# Patient Record
Sex: Male | Born: 1960
Health system: Southern US, Community
[De-identification: ages and names within clinical notes are randomized; demographics above are authoritative.]

---

## 2003-12-03 ENCOUNTER — Emergency Department (HOSPITAL_COMMUNITY): Admission: AD | Admit: 2003-12-03 | Discharge: 2003-12-03 | Payer: Self-pay | Admitting: Internal Medicine

## 2005-09-02 ENCOUNTER — Emergency Department (HOSPITAL_COMMUNITY): Admission: EM | Admit: 2005-09-02 | Discharge: 2005-09-02 | Payer: Self-pay | Admitting: Emergency Medicine

## 2006-02-06 ENCOUNTER — Emergency Department (HOSPITAL_COMMUNITY): Admission: EM | Admit: 2006-02-06 | Discharge: 2006-02-06 | Payer: Self-pay | Admitting: Family Medicine

## 2006-02-09 ENCOUNTER — Emergency Department (HOSPITAL_COMMUNITY): Admission: EM | Admit: 2006-02-09 | Discharge: 2006-02-09 | Payer: Self-pay | Admitting: Family Medicine

## 2006-05-27 ENCOUNTER — Ambulatory Visit: Payer: Self-pay | Admitting: Family Medicine

## 2006-06-03 ENCOUNTER — Ambulatory Visit: Payer: Self-pay | Admitting: Family Medicine

## 2006-07-08 ENCOUNTER — Ambulatory Visit: Payer: Self-pay | Admitting: Family Medicine

## 2007-03-08 ENCOUNTER — Ambulatory Visit: Payer: Self-pay | Admitting: Family Medicine

## 2007-03-09 LAB — CONVERTED CEMR LAB
AST: 39 units/L — ABNORMAL HIGH (ref 0–37)
Alkaline Phosphatase: 93 units/L (ref 39–117)
Total Bilirubin: 0.6 mg/dL (ref 0.3–1.2)
Total Protein: 6.3 g/dL (ref 6.0–8.3)

## 2007-06-15 DIAGNOSIS — R945 Abnormal results of liver function studies: Secondary | ICD-10-CM | POA: Insufficient documentation

## 2007-06-15 DIAGNOSIS — L408 Other psoriasis: Secondary | ICD-10-CM | POA: Insufficient documentation

## 2007-06-15 DIAGNOSIS — M6580 Other synovitis and tenosynovitis, unspecified site: Secondary | ICD-10-CM | POA: Insufficient documentation

## 2007-06-15 DIAGNOSIS — M9420 Chondromalacia, unspecified site: Secondary | ICD-10-CM | POA: Insufficient documentation

## 2007-12-11 ENCOUNTER — Emergency Department (HOSPITAL_COMMUNITY): Admission: EM | Admit: 2007-12-11 | Discharge: 2007-12-11 | Payer: Self-pay | Admitting: *Deleted

## 2008-03-17 ENCOUNTER — Ambulatory Visit: Payer: Self-pay | Admitting: Family Medicine

## 2008-03-20 LAB — CONVERTED CEMR LAB
ALT: 34 units/L (ref 0–53)
Albumin: 3.7 g/dL (ref 3.5–5.2)
BUN: 9 mg/dL (ref 6–23)
Basophils Relative: 1.4 % — ABNORMAL HIGH (ref 0.0–1.0)
Bilirubin, Direct: 0.1 mg/dL (ref 0.0–0.3)
CO2: 30 meq/L (ref 19–32)
Calcium: 9.1 mg/dL (ref 8.4–10.5)
Eosinophils Relative: 5.4 % — ABNORMAL HIGH (ref 0.0–5.0)
GFR calc Af Amer: 84 mL/min
Glucose, Bld: 100 mg/dL — ABNORMAL HIGH (ref 70–99)
HCT: 42.1 % (ref 39.0–52.0)
Hemoglobin: 14.4 g/dL (ref 13.0–17.0)
LDL Cholesterol: 58 mg/dL (ref 0–99)
Lymphocytes Relative: 25.1 % (ref 12.0–46.0)
Monocytes Absolute: 0.5 10*3/uL (ref 0.1–1.0)
Monocytes Relative: 8.4 % (ref 3.0–12.0)
Neutro Abs: 3.4 10*3/uL (ref 1.4–7.7)
RBC: 5.02 M/uL (ref 4.22–5.81)
RDW: 12.2 % (ref 11.5–14.6)
Total CHOL/HDL Ratio: 3.3
Total Protein: 6.7 g/dL (ref 6.0–8.3)
Triglycerides: 158 mg/dL — ABNORMAL HIGH (ref 0–149)
WBC: 5.7 10*3/uL (ref 4.5–10.5)

## 2008-04-05 ENCOUNTER — Ambulatory Visit: Payer: Self-pay | Admitting: Family Medicine

## 2008-04-05 DIAGNOSIS — I10 Essential (primary) hypertension: Secondary | ICD-10-CM | POA: Insufficient documentation

## 2008-05-24 ENCOUNTER — Ambulatory Visit: Payer: Self-pay | Admitting: Family Medicine

## 2008-08-16 ENCOUNTER — Ambulatory Visit: Payer: Self-pay | Admitting: Family Medicine

## 2008-08-17 ENCOUNTER — Encounter: Payer: Self-pay | Admitting: Family Medicine

## 2009-03-02 ENCOUNTER — Ambulatory Visit: Payer: Self-pay | Admitting: Family Medicine

## 2009-03-02 DIAGNOSIS — F528 Other sexual dysfunction not due to a substance or known physiological condition: Secondary | ICD-10-CM | POA: Insufficient documentation

## 2009-03-02 DIAGNOSIS — R5383 Other fatigue: Secondary | ICD-10-CM

## 2009-03-02 DIAGNOSIS — J309 Allergic rhinitis, unspecified: Secondary | ICD-10-CM | POA: Insufficient documentation

## 2009-03-02 DIAGNOSIS — R5381 Other malaise: Secondary | ICD-10-CM | POA: Insufficient documentation

## 2009-03-05 LAB — CONVERTED CEMR LAB
AST: 43 units/L — ABNORMAL HIGH (ref 0–37)
Albumin: 3.8 g/dL (ref 3.5–5.2)
Alkaline Phosphatase: 87 units/L (ref 39–117)
BUN: 16 mg/dL (ref 6–23)
Basophils Relative: 0.5 % (ref 0.0–3.0)
Calcium: 8.9 mg/dL (ref 8.4–10.5)
Cholesterol: 109 mg/dL (ref 0–200)
Creatinine, Ser: 1.4 mg/dL (ref 0.4–1.5)
GFR calc non Af Amer: 57.59 mL/min (ref >60)
HCT: 48.7 % (ref 39.0–52.0)
Hemoglobin: 16.7 g/dL (ref 13.0–17.0)
LDL Cholesterol: 55 mg/dL (ref 0–99)
Lymphocytes Relative: 24.9 % (ref 12.0–46.0)
Lymphs Abs: 1.4 10*3/uL (ref 0.7–4.0)
MCHC: 34.2 g/dL (ref 30.0–36.0)
Monocytes Relative: 7.1 % (ref 3.0–12.0)
Neutro Abs: 3.8 10*3/uL (ref 1.4–7.7)
RBC: 5.89 M/uL — ABNORMAL HIGH (ref 4.22–5.81)
RDW: 13.2 % (ref 11.5–14.6)
TSH: 1.64 microintl units/mL (ref 0.35–5.50)
Total CHOL/HDL Ratio: 3
Total Protein: 6.7 g/dL (ref 6.0–8.3)
Triglycerides: 109 mg/dL (ref 0.0–149.0)

## 2009-03-06 LAB — CONVERTED CEMR LAB
Sex Hormone Binding: 33 nmol/L (ref 13–71)
Testosterone: 220.07 ng/dL — ABNORMAL LOW (ref 350–890)

## 2009-04-13 ENCOUNTER — Ambulatory Visit: Payer: Self-pay | Admitting: Family Medicine

## 2009-04-13 DIAGNOSIS — E786 Lipoprotein deficiency: Secondary | ICD-10-CM | POA: Insufficient documentation

## 2009-04-13 DIAGNOSIS — E291 Testicular hypofunction: Secondary | ICD-10-CM | POA: Insufficient documentation

## 2009-04-13 DIAGNOSIS — M25579 Pain in unspecified ankle and joints of unspecified foot: Secondary | ICD-10-CM | POA: Insufficient documentation

## 2009-05-02 ENCOUNTER — Telehealth (INDEPENDENT_AMBULATORY_CARE_PROVIDER_SITE_OTHER): Payer: Self-pay | Admitting: *Deleted

## 2011-08-07 LAB — POCT URINALYSIS DIP (DEVICE)
Glucose, UA: NEGATIVE
Nitrite: NEGATIVE
Operator id: 270961
pH: 6

## 2011-08-07 LAB — URINE CULTURE: Culture: NO GROWTH

## 2014-08-08 ENCOUNTER — Other Ambulatory Visit: Payer: Self-pay | Admitting: Rheumatology

## 2014-08-08 ENCOUNTER — Ambulatory Visit
Admission: RE | Admit: 2014-08-08 | Discharge: 2014-08-08 | Disposition: A | Discharge status: Home / Self Care | Payer: BC Managed Care – PPO | Source: Ambulatory Visit | Attending: Rheumatology | Admitting: Rheumatology

## 2014-08-08 DIAGNOSIS — Z9225 Personal history of immunosupression therapy: Secondary | ICD-10-CM

## 2014-09-27 ENCOUNTER — Ambulatory Visit: Payer: BC Managed Care – PPO | Admitting: Family Medicine

## 2015-02-06 ENCOUNTER — Ambulatory Visit: Payer: Self-pay | Admitting: Surgery

## 2015-02-06 NOTE — H&P (Signed)
History of Present Illness Kenneth [name]. Lee Kuang MD; 02/06/2015 10:15 PM) Patient words: pilonidal cyst.  The patient is a 54 year old male who presents with a pilonidal cyst. Patient referred by Corine Shelter, PA-C for evaluation of pilonidal disease. This is a 54 yo male who presents with an 8 month history of a palpable lump near his coccyx. This has become larger and more tender. He has noticed some bleeding from this area. He had a small mass in this area for years, but it became noticeably larger in the last few months. His wife apparently pulled a long hair from this area. He was evaluated and felt to have a pilonidal cyst. He is now referred for surgical evaluation. Other Problems (Ammie Eversole, LPN; 0/97/3532 9:92 PM) Arthritis High blood pressure  Past Surgical History (Ammie Eversole, LPN; 03/12/8340 9:62 PM) Cataract Surgery Right. Oral Surgery Resection of Small Bowel  Diagnostic Studies History (Ammie Eversole, LPN; 2/29/7989 2:11 PM) Colonoscopy never  Allergies (Ammie Eversole, LPN; 9/41/7408 1:44 PM) No Known Drug Allergies 02/06/2015  Medication History (Ammie Eversole, LPN; 07/04/5630 4:97 PM) Lisinopril-Hydrochlorothiazide (20-25MG  Tablet, Oral) Active.  Social History (Ammie Eversole, LPN; 0/26/3785 8:85 PM) Alcohol use Occasional alcohol use. Caffeine use Carbonated beverages, Coffee, Tea. Illicit drug use Remotely quit drug use. Tobacco use Former smoker.  Family History Aleatha Borer, LPN; 0/27/7412 8:78 PM) Breast Cancer Mother. Malignant Neoplasm Of Pancreas Father. Migraine Headache Mother.     Review of Systems (Ammie Eversole LPN; 6/76/7209 4:70 PM) General Not Present- Appetite Loss, Chills, Fatigue, Fever, Night Sweats, Weight Gain and Weight Loss. Skin Present- Dryness. Not Present- Change in Wart/Mole, Hives, Jaundice, New Lesions, Non-Healing Wounds, Rash and Ulcer. HEENT Present- Hearing Loss and Wears glasses/contact lenses.  Not Present- Earache, Hoarseness, Nose Bleed, Oral Ulcers, Ringing in the Ears, Seasonal Allergies, Sinus Pain, Sore Throat, Visual Disturbances and Yellow Eyes. Respiratory Present- Snoring. Not Present- Bloody sputum, Chronic Cough, Difficulty Breathing and Wheezing. Breast Not Present- Breast Mass, Breast Pain, Nipple Discharge and Skin Changes. Cardiovascular Not Present- Chest Pain, Difficulty Breathing Lying Down, Leg Cramps, Palpitations, Rapid Heart Rate, Shortness of Breath and Swelling of Extremities. Gastrointestinal Not Present- Abdominal Pain, Bloating, Bloody Stool, Change in Bowel Habits, Chronic diarrhea, Constipation, Difficulty Swallowing, Excessive gas, Gets full quickly at meals, Hemorrhoids, Indigestion, Nausea, Rectal Pain and Vomiting. Male Genitourinary Not Present- Blood in Urine, Change in Urinary Stream, Frequency, Impotence, Nocturia, Painful Urination, Urgency and Urine Leakage. Musculoskeletal Present- Joint Pain. Not Present- Back Pain, Joint Stiffness, Muscle Pain, Muscle Weakness and Swelling of Extremities. Neurological Not Present- Decreased Memory, Fainting, Headaches, Numbness, Seizures, Tingling, Tremor, Trouble walking and Weakness. Psychiatric Not Present- Anxiety, Bipolar, Change in Sleep Pattern, Depression, Fearful and Frequent crying. Endocrine Not Present- Cold Intolerance, Excessive Hunger, Hair Changes, Heat Intolerance, Hot flashes and New Diabetes. Hematology Not Present- Easy Bruising, Excessive bleeding, Gland problems, HIV and Persistent Infections.  Vitals (Ammie Eversole LPN; 9/62/8366 2:94 PM) 02/06/2015 3:35 PM Weight: 259.6 lb Height: 71in Body Surface Area: 2.43 m Body Mass Index: 36.21 kg/m Temp.: 98.31F(Oral)  Pulse: 86 (Regular)  BP: 130/82 (Sitting, Left Arm, Standard)     Physical Exam Rodman Key K. Mackenze Grandison MD; 02/06/2015 10:16 PM)  The physical exam findings are as follows: Note:WDWN in NAD HEENT: EOMI, sclera  anicteric Neck: No masses, no thyromegaly Lungs: CTA bilaterally; normal respiratory effort CV: Regular rate and rhythm; no murmurs Abd: +bowel sounds, soft, non-tender, no masses Coccygeal region - 1 cm protruding mass with small opening draining some  scant bloody fluid. Mildly tender to palpation There is some firm scar tissue around this area. No surrounding erythema or induration. Ext: Well-perfused; no edema Skin: Warm, dry; no sign of jaundice    Assessment & Plan Rodman Key K. Elisah Parmer MD; 02/06/2015 4:00 PM)  PILONIDAL CYST WITH ABSCESS (685.0  L05.01)  Current Plans Schedule for Surgery - Pilonidal cystectomy. The surgical procedure has been discussed with the patient. Potential risks, benefits, alternative treatments, and expected outcomes have been explained. All of the patient's questions at this time have been answered. The likelihood of reaching the patient's treatment goal is good. The patient understand the proposed surgical procedure and wishes to proceed.  Kenneth [name]. Georgette Dover, MD, [facility] Surgery  General/ Trauma Surgery  02/06/2015 10:20 PM

## 2015-06-20 ENCOUNTER — Encounter: Payer: Self-pay | Admitting: Family Medicine

## 2015-06-20 ENCOUNTER — Ambulatory Visit (INDEPENDENT_AMBULATORY_CARE_PROVIDER_SITE_OTHER): Payer: BLUE CROSS/BLUE SHIELD | Admitting: Family Medicine

## 2015-06-20 ENCOUNTER — Other Ambulatory Visit: Payer: BLUE CROSS/BLUE SHIELD

## 2015-06-20 ENCOUNTER — Other Ambulatory Visit (INDEPENDENT_AMBULATORY_CARE_PROVIDER_SITE_OTHER): Payer: BLUE CROSS/BLUE SHIELD

## 2015-06-20 VITALS — BP 134/86 | HR 72 | Wt 253.0 lb

## 2015-06-20 DIAGNOSIS — M25571 Pain in right ankle and joints of right foot: Secondary | ICD-10-CM | POA: Diagnosis not present

## 2015-06-20 DIAGNOSIS — M79672 Pain in left foot: Secondary | ICD-10-CM

## 2015-06-20 DIAGNOSIS — M6528 Calcific tendinitis, other site: Secondary | ICD-10-CM | POA: Diagnosis not present

## 2015-06-20 MED ORDER — DICLOFENAC SODIUM 2 % TD SOLN: TRANSDERMAL | Status: DC

## 2015-06-20 NOTE — Progress Notes (Signed)
Pre visit review using our clinic review tool, if applicable. No additional management support is needed unless otherwise documented below in the visit note. 

## 2015-06-20 NOTE — Patient Instructions (Addendum)
Nice to meet you Rehab exercises 3x/week Ice the area after activity and before bedtime as needed  Vitamin D 4000units/day Try the Pennsaid, called into [facility], for topical use up to 2 times daily See me again in 3-4weeks

## 2015-06-20 NOTE — Progress Notes (Signed)
[name] [name],  [address] Phone: [phone] Subjective:    I'm seeing this patient by the request  of:  [name], MD   CC: Left ankle pain  [id number] Kenneth [name] is a 54 y.o. male coming in with complaint of left ankle pain. Patient has had this for quite some time. Seems to be more intermittent. Patient states that he can have many days if not weeks being pain-free and then all of a sudden has a sharp pain that seems to go across the joint itself. Patient states some of the pain seems to be on the posterior aspect of the ankle and sometimes to the lateral aspect. States sometimes it can have some swelling associated with it. Patient has seen other providers for other problems and has had significant osteoarthritic changes. Patient states that most of the time it does not affect his daily activities but sometimes he can walk with a limp for approximately 2-3 days and then it seems to improve again. Rates the severity of pain is when it occurs a 7 out of 10. Patient can ambulate but does have a limp when it occurs.  No past medical history on file. No past surgical history on file. History  Substance Use Topics  . Smoking status: Current Some Day Smoker  . Smokeless tobacco: Not on file  . Alcohol Use: Not on file   Not on File No family history on file.      Past medical history, social, surgical and family history all reviewed in electronic medical record.   Review of Systems: No headache, visual changes, nausea, vomiting, diarrhea, constipation, dizziness, abdominal pain, skin rash, fevers, chills, night sweats, weight loss, swollen lymph nodes, body aches, joint swelling, muscle aches, chest pain, shortness of breath, mood changes.   Objective Blood pressure 134/86, pulse 72, weight 253 lb (114.76 kg), SpO2 96 %.  General: No apparent distress alert and oriented x3 mood and affect normal, dressed appropriately.    HEENT: Pupils equal, extraocular movements intact  Respiratory: Patient's speak in full sentences and does not appear short of breath  Cardiovascular: No lower extremity edema, non tender, no erythema  Skin: Warm dry intact with no signs of infection or rash on extremities or on axial skeleton. Patient does have psoriatic on all extensor surfaces. Clubbing of the extremities Abdomen: Soft nontender  Neuro: Cranial nerves II through XII are intact, neurovascularly intact in all extremities with 2+ DTRs and 2+ pulses.  Lymph: No lymphadenopathy of posterior or anterior cervical chain or axillae bilaterally.  Gait normal with good balance and coordination.  MSK:  Non tender with full range of motion and good stability and symmetric strength and tone of shoulders, elbows, wrist, hip, knee and bilaterally.  Ankle: Left No visible erythema or swelling. Haglund's nodule noted Range of motion is full in all directions. Mild tightness of the posterior cord Strength is 5/5 in all directions. Stable lateral and medial ligaments; squeeze test and kleiger test unremarkable; Talar dome nontender; No pain at base of 5th MT; No tenderness over cuboid; No tenderness over N spot or navicular prominence No tenderness on posterior aspects of lateral and medial malleolus No sign of peroneal tendon subluxations or tenderness to palpation Negative tarsal tunnel tinel's Able to walk 4 steps. Contralateral ankle unremarkable  MSK US performed of: Left This study was ordered, performed, and interpreted by Charlann Boxer D.O.  Foot/Ankle:   All structures visualized.   Talar  dome unremarkable  Ankle mortise without effusion. Middle hypoechoic changes of the peroneal tendons no true tear appreciated Posterior tibialis, flexor hallucis longus, and flexor digitorum longus tendons unremarkable on long and transverse views without sheath effusions. Accessory tendon noted. Significant hypoechoic changes and calcific  changes of the insertional Achilles Anterior Talofibular Ligament and Calcaneofibular Ligaments unremarkable and intact. Deltoid Ligament unremarkable and intact. Plantar fascia intact and without effusion, normal thickness. No increased doppler signal, cap sign, or thickening of tibial cortex. Power doppler signal normal.  IMPRESSION:  Chronic Achilles with calcific changes  Procedure note 70177; 15 minutes spent for Therapeutic exercises as stated in above notes.  This included exercises focusing on stretching, strengthening, with significant focus on eccentric aspects.  - Ankle strengthening that included:  Basic range of motion exercises to allow proper full motion at ankle Stretching of the lower leg and hamstrings  Theraband exercises for the lower leg - inversion, eversion, dorsiflexion and plantarflexion each to be completed with a theraband Balance exercises to increase proprioception Weight bearing exercises to increase strength and balance Proper technique shown and discussed handout in great detail with ATC.  All questions were discussed and answered.     Impression and Recommendations:     This case required medical decision making of moderate complexity.

## 2015-06-20 NOTE — Assessment & Plan Note (Signed)
Patient does have calcific changes noted. We discussed icing regimen, home exercises, bracing. Patient worked with Product/process development scientist today be beneficial. Patient given topical anti-inflammatory's. Patient come back and see me again in 3 weeks to make sure responding well to conservative therapy.

## 2015-07-11 ENCOUNTER — Encounter: Payer: Self-pay | Admitting: Family Medicine

## 2015-07-11 ENCOUNTER — Ambulatory Visit (INDEPENDENT_AMBULATORY_CARE_PROVIDER_SITE_OTHER): Payer: BLUE CROSS/BLUE SHIELD | Admitting: Family Medicine

## 2015-07-11 VITALS — BP 132/80 | HR 78 | Wt 256.0 lb

## 2015-07-11 DIAGNOSIS — M6528 Calcific tendinitis, other site: Secondary | ICD-10-CM | POA: Diagnosis not present

## 2015-07-11 NOTE — Assessment & Plan Note (Signed)
Patient given phase II exercises and we discussed heel lift in his shoes. Discuss other changes and ergonomic changes he can do throughout the day. Patient will do the topical anti-inflammatory and icing protocol. We discussed vitamin D supplementation for his psoriasis as well as for the calcific changes of the Achilles. We discussed him following up again in 3-4 weeks for further evaluation and treatment.

## 2015-07-11 NOTE — Progress Notes (Signed)
[name] [place], [place] [address] Phone: [phone] Subjective:      CC: Left ankle pain follow up  [id number] Kenneth [name] is a 54 y.o. male coming in with complaint of left ankle pain.patient was found to have more of a calcific Achilles tendinitis. Patient was to wear heel lift, exercise, icing protocol, and proper shoes. We discussed topical anti-inflammatory as well. Patient states he is approximate 70% better. States that when he was on vacation and seemed to be the most beneficial. Doing the exercises regularly as well as icing. Patient states that after sitting a long amount of time still has tightness but not the significant pain that he was having previously.  History reviewed. No pertinent past medical history. History reviewed. No pertinent past surgical history. Social History  Substance Use Topics  . Smoking status: Current Some Day Smoker  . Smokeless tobacco: None  . Alcohol Use: None   Not on File History reviewed. No pertinent family history.      Past medical history, social, surgical and family history all reviewed in electronic medical record.   Review of Systems: No headache, visual changes, nausea, vomiting, diarrhea, constipation, dizziness, abdominal pain, skin rash, fevers, chills, night sweats, weight loss, swollen lymph nodes, body aches, joint swelling, muscle aches, chest pain, shortness of breath, mood changes.   Objective Blood pressure 132/80, pulse 78, weight 256 lb (116.121 kg).  General: No apparent distress alert and oriented x3 mood and affect normal, dressed appropriately.  HEENT: Pupils equal, extraocular movements intact  Respiratory: Patient's speak in full sentences and does not appear short of breath  Cardiovascular: No lower extremity edema, non tender, no erythema  Skin: Warm dry intact with no signs of infection or rash on extremities or on axial skeleton. Patient does have  psoriatic on all extensor surfaces. Clubbing of the extremities Abdomen: Soft nontender  Neuro: Cranial nerves II through XII are intact, neurovascularly intact in all extremities with 2+ DTRs and 2+ pulses.  Lymph: No lymphadenopathy of posterior or anterior cervical chain or axillae bilaterally.  Gait normal with good balance and coordination.  MSK:  Non tender with full range of motion and good stability and symmetric strength and tone of shoulders, elbows, wrist, hip, knee and bilaterally.  Ankle: Left No visible erythema or swelling. Haglund's nodule noted Range of motion is full in all directions, increased from previous exam with less tightness Strength is 5/5 in all directions. Stable lateral and medial ligaments; squeeze test and kleiger test unremarkable; Talar dome nontender; No pain at base of 5th MT; No tenderness over cuboid; No tenderness over N spot or navicular prominence No tenderness on posterior aspects of lateral and medial malleolus No sign of peroneal tendon subluxations or tenderness to palpation Negative tarsal tunnel tinel's Able to walk 4 steps. Contralateral ankle unremarkable  MSK US performed of: Left This study was ordered, performed, and interpreted by Charlann Boxer D.O.  Foot/Ankle:   All structures visualized.   Talar dome unremarkable  Ankle mortise without effusion. Posterior tibialis, flexor hallucis longus, and flexor digitorum longus tendons unremarkable on long and transverse views without sheath effusions. Accessory tendon noted. Achilles tendon does still have some mild-to-moderate hypoechoic changes at its insertion as well as the retrocalcaneal bursitis noted. Significant decrease in calcific changes that were noted previously. Increasing Doppler flow noted. Anterior Talofibular Ligament and Calcaneofibular Ligaments unremarkable and intact. Deltoid Ligament unremarkable and intact. Plantar fascia intact and without effusion, normal  thickness. No increased doppler signal, cap sign, or thickening of tibial cortex. Power doppler signal normal.  IMPRESSION:  Chronic Achilles with calcific changesbut improvement    Impression and Recommendations:     This case required medical decision making of moderate complexity.

## 2015-07-11 NOTE — Patient Instructions (Signed)
You are doing great!!!! Ice is friend Vitamin D 2000 iU daily over the counter Continue the exercises Consider a heel lift in the left shoe will help during the day Brace is up to you at this time.  See me again in 3-4 weeks

## 2015-08-08 ENCOUNTER — Ambulatory Visit: Payer: BLUE CROSS/BLUE SHIELD | Admitting: Family Medicine

## 2016-04-10 DIAGNOSIS — N529 Male erectile dysfunction, unspecified: Secondary | ICD-10-CM | POA: Diagnosis not present

## 2016-04-10 DIAGNOSIS — I10 Essential (primary) hypertension: Secondary | ICD-10-CM | POA: Diagnosis not present

## 2016-07-17 DIAGNOSIS — R1013 Epigastric pain: Secondary | ICD-10-CM | POA: Diagnosis not present

## 2017-03-02 DIAGNOSIS — M7022 Olecranon bursitis, left elbow: Secondary | ICD-10-CM | POA: Diagnosis not present

## 2017-03-02 DIAGNOSIS — M19012 Primary osteoarthritis, left shoulder: Secondary | ICD-10-CM | POA: Diagnosis not present

## 2017-04-10 DIAGNOSIS — K219 Gastro-esophageal reflux disease without esophagitis: Secondary | ICD-10-CM | POA: Diagnosis not present

## 2017-04-10 DIAGNOSIS — R5383 Other fatigue: Secondary | ICD-10-CM | POA: Diagnosis not present

## 2017-04-10 DIAGNOSIS — I10 Essential (primary) hypertension: Secondary | ICD-10-CM | POA: Diagnosis not present

## 2017-04-10 DIAGNOSIS — N529 Male erectile dysfunction, unspecified: Secondary | ICD-10-CM | POA: Diagnosis not present

## 2017-04-10 DIAGNOSIS — Z125 Encounter for screening for malignant neoplasm of prostate: Secondary | ICD-10-CM | POA: Diagnosis not present

## 2018-04-27 DIAGNOSIS — R5383 Other fatigue: Secondary | ICD-10-CM | POA: Diagnosis not present

## 2018-04-27 DIAGNOSIS — K219 Gastro-esophageal reflux disease without esophagitis: Secondary | ICD-10-CM | POA: Diagnosis not present

## 2018-04-27 DIAGNOSIS — I10 Essential (primary) hypertension: Secondary | ICD-10-CM | POA: Diagnosis not present

## 2018-04-27 DIAGNOSIS — N529 Male erectile dysfunction, unspecified: Secondary | ICD-10-CM | POA: Diagnosis not present

## 2018-04-28 DIAGNOSIS — L0501 Pilonidal cyst with abscess: Secondary | ICD-10-CM | POA: Diagnosis not present

## 2018-04-28 DIAGNOSIS — E291 Testicular hypofunction: Secondary | ICD-10-CM | POA: Diagnosis not present

## 2018-10-27 ENCOUNTER — Ambulatory Visit: Payer: BLUE CROSS/BLUE SHIELD | Admitting: Family Medicine

## 2018-10-27 ENCOUNTER — Ambulatory Visit (INDEPENDENT_AMBULATORY_CARE_PROVIDER_SITE_OTHER)
Admission: RE | Admit: 2018-10-27 | Discharge: 2018-10-27 | Disposition: A | Discharge status: Home / Self Care | Payer: BLUE CROSS/BLUE SHIELD | Source: Ambulatory Visit | Attending: Family Medicine | Admitting: Family Medicine

## 2018-10-27 ENCOUNTER — Other Ambulatory Visit: Payer: Self-pay

## 2018-10-27 ENCOUNTER — Ambulatory Visit: Payer: Self-pay

## 2018-10-27 VITALS — BP 110/80 | HR 89 | Ht 70.0 in | Wt 271.0 lb

## 2018-10-27 DIAGNOSIS — G8929 Other chronic pain: Secondary | ICD-10-CM

## 2018-10-27 DIAGNOSIS — K219 Gastro-esophageal reflux disease without esophagitis: Secondary | ICD-10-CM | POA: Diagnosis not present

## 2018-10-27 DIAGNOSIS — M25562 Pain in left knee: Secondary | ICD-10-CM

## 2018-10-27 DIAGNOSIS — M1712 Unilateral primary osteoarthritis, left knee: Secondary | ICD-10-CM | POA: Diagnosis not present

## 2018-10-27 DIAGNOSIS — N529 Male erectile dysfunction, unspecified: Secondary | ICD-10-CM | POA: Diagnosis not present

## 2018-10-27 DIAGNOSIS — I10 Essential (primary) hypertension: Secondary | ICD-10-CM | POA: Diagnosis not present

## 2018-10-27 DIAGNOSIS — E291 Testicular hypofunction: Secondary | ICD-10-CM | POA: Diagnosis not present

## 2018-10-27 MED ORDER — VITAMIN D (ERGOCALCIFEROL) 1.25 MG (50000 UNIT) PO CAPS: 50000.0000 [IU] | ORAL_CAPSULE | ORAL | 0 refills | Status: DC

## 2018-10-27 MED ORDER — DICLOFENAC SODIUM 2 % TD SOLN: 2.0000 g | Freq: Two times a day (BID) | TRANSDERMAL | 3 refills | Status: DC

## 2018-10-27 NOTE — Patient Instructions (Signed)
Good to see you  Ice 20 minutes 2 times daily. Usually after activity and before bed. Xrays downstairs pennsaid pinkie amount topically 2 times daily as needed.  Once weekly vitamin D  Over the  Counter get Turmeric 500mg  twice daily  They will call you on the brace See me again in 4-6 weeks Happy holidays!

## 2018-10-27 NOTE — Assessment & Plan Note (Signed)
Degenerative changes noted mostly medial compartment.  Patient does have some instability and abnormal thigh to calf ratio so patient will be fitted with a custom medial unloader brace.  Discussed icing regimen and home exercise, topical anti-inflammatories given, discussed to avoid other possibilities at the moment.  Follow-up with me again 4 to 8 weeks.

## 2018-10-27 NOTE — Progress Notes (Signed)
[name] [place], [place] [address] Phone: [phone] Subjective:     CC: Bilateral knee pain  [id number]  Kenneth [name] is a 57 y.o. male coming in with complaint of bilateral knee pain, left greater than right. Patient works on his feet and has had pain for 2 months. Pain on medial aspect of knee. Does use ice. Does use orthotics.  Patient states that after standing for long amount of time starts having more pain.  Seems to be on the medial aspect of both knees.  Does not notice any swelling specifically.  Patient states that has to do Tylenol and ice and seems to give it time.  When patient works 3 days in a row    No past medical history on file. No past surgical history on file. Social History   Socioeconomic History  . Marital status: Married    Spouse name: Not on file  . Number of children: Not on file  . Years of education: Not on file  . Highest education level: Not on file  Occupational History  . Not on file  Social Needs  . Financial resource strain: Not on file  . Food insecurity:    Worry: Not on file    Inability: Not on file  . Transportation needs:    Medical: Not on file    Non-medical: Not on file  Tobacco Use  . Smoking status: Current Some Day Smoker  Substance and Sexual Activity  . Alcohol use: Not on file  . Drug use: Not on file  . Sexual activity: Not on file  Lifestyle  . Physical activity:    Days per week: Not on file    Minutes per session: Not on file  . Stress: Not on file  Relationships  . Social connections:    Talks on phone: Not on file    Gets together: Not on file    Attends religious service: Not on file    Active member of club or organization: Not on file    Attends meetings of clubs or organizations: Not on file    Relationship status: Not on file  Other Topics Concern  . Not on file  Social History Narrative  . Not on file   Not on File No family history on  file.   Current Outpatient Medications (Cardiovascular):  .  amLODipine-benazepril (LOTREL) 5-20 MG per capsule,      Current Outpatient Medications (Other):  Marland Kitchen  Diclofenac Sodium 2 % SOLN, Apply 1 pump twice daily. .  Diclofenac Sodium 2 % SOLN, Place 2 g onto the skin 2 (two) times daily. .  Vitamin D, Ergocalciferol, (DRISDOL) 1.25 MG (50000 UT) CAPS capsule, Take 1 capsule (50,000 Units total) by mouth every 7 (seven) days.    Past medical history, social, surgical and family history all reviewed in electronic medical record.  No pertanent information unless stated regarding to the chief complaint.   Review of Systems:  No headache, visual changes, nausea, vomiting, diarrhea, constipation, dizziness, abdominal pain, skin rash, fevers, chills, night sweats, weight loss, swollen lymph nodes, body aches, joint swelling, muscle aches, chest pain, shortness of breath, mood changes.   Objective  Blood pressure 110/80, pulse 89, height 5\' 10"  (1.778 m), weight 271 lb (122.9 kg), SpO2 96 %.    General: No apparent distress alert and oriented x3 mood and affect normal, dressed appropriately.  HEENT: Pupils equal, extraocular movements intact  Respiratory: Patient's speak in  full sentences and does not appear short of breath  Cardiovascular: No lower extremity edema, non tender, no erythema  Skin: Warm dry intact with no signs of infection or rash on extremities or on axial skeleton.  Abdomen: Soft nontender  Neuro: Cranial nerves II through XII are intact, neurovascularly intact in all extremities with 2+ DTRs and 2+ pulses.  Lymph: No lymphadenopathy of posterior or anterior cervical chain or axillae bilaterally.  Gait normal with good balance and coordination.  MSK:  Non tender with full range of motion and good stability and symmetric strength and tone of shoulders, elbows, wrist, hip, and ankles bilaterally.  Knee: Left valgus deformity noted. Large thigh to calf ratio.  Tender to  palpation over medial .  ROM full in flexion and extension and lower leg rotation. instability with valgus force.  painful patellar compression. Patellar glide with moderate crepitus. Patellar and quadriceps tendons unremarkable. Hamstring and quadriceps strength is normal. Contralateral knee shows mild arthritic changes as well but no instability.  Mild pain over the medial joint line  MSK US performed of: Left knee This study was ordered, performed, and interpreted by Charlann Boxer D.O.  Knee: Moderate to severe narrowing of the medial joint space on the left knee noted.  Mild degenerative changes of the medial and lateral meniscus noted.  Patellofemoral joint mildly narrowed but no effusion noted.  IMPRESSION: Left knee arthritis with severe medial compartment disease    Impression and Recommendations:     This case required medical decision making of moderate complexity. The above documentation has been reviewed and is accurate and complete Lyndal Pulley, DO       Note: This dictation was prepared with Dragon dictation along with smaller phrase technology. Any transcriptional errors that result from this process are unintentional.

## 2018-11-03 NOTE — Telephone Encounter (Signed)
This encounter was created in error - please disregard.

## 2018-12-01 ENCOUNTER — Ambulatory Visit: Payer: BLUE CROSS/BLUE SHIELD | Admitting: Family Medicine

## 2018-12-01 ENCOUNTER — Encounter: Payer: Self-pay | Admitting: Family Medicine

## 2018-12-01 DIAGNOSIS — M1712 Unilateral primary osteoarthritis, left knee: Secondary | ICD-10-CM | POA: Diagnosis not present

## 2018-12-01 NOTE — Assessment & Plan Note (Signed)
Stable.  Doing very well.  Encourage patient to continue the conservative therapy as long as he does well and no need for any type of injection.  Finish up with once weekly vitamin D for another 8 weeks.  Topical anti-inflammatories encouraged.  Follow-up as needed

## 2018-12-01 NOTE — Patient Instructions (Signed)
Good to see you  Try without the brace for a weke or 2 and see how you feel  Continue the vitamin D and once the weekly is gone go to 2000IU daily  Stay active See me again in 6 weeks if not great Happy New Year!

## 2018-12-01 NOTE — Progress Notes (Signed)
[name] [address] [place], [place] [address] Phone: [phone] Subjective:   Kenneth Kenneth [name], am serving as a scribe for Dr. Hulan [name].   CC: Left knee pain follow-up  OQH:[id number]  Kenneth Kenneth [name] is a 58 y.o. male coming in with complaint of left knee pain. Has felt improvement since last visit. Is using the brace which helps to alleviate his pain, performing the exercises and using vitamin d. Would like xray results from last visit.  Patient's x-rays were independently visualized by me showing mild to moderate osteoarthritic changes mostly the patellofemoral joint. staztes feeling much better with the brace, vitamins and topical  Happy overall      Kenneth [name] past medical history on file. Kenneth [name] past surgical history on file. Social History   Socioeconomic History  . Marital status: Married    Spouse name: Not on file  . Number of children: Not on file  . Years of education: Not on file  . Highest education level: Not on file  Occupational History  . Not on file  Social Needs  . Financial resource strain: Not on file  . Food insecurity:    Worry: Not on file    Inability: Not on file  . Transportation needs:    Medical: Not on file    Non-medical: Not on file  Tobacco Use  . Smoking status: Current Some Day Smoker  Substance and Sexual Activity  . Alcohol use: Not on file  . Drug use: Not on file  . Sexual activity: Not on file  Lifestyle  . Physical activity:    Days per week: Not on file    Minutes per session: Not on file  . Stress: Not on file  Relationships  . Social connections:    Talks on phone: Not on file    Gets together: Not on file    Attends religious service: Not on file    Active member of club or organization: Not on file    Attends meetings of clubs or organizations: Not on file    Relationship status: Not on file  Other Topics Concern  . Not on file  Social History Narrative  . Not on file   Not on  File Kenneth [name] family history on file.   Current Outpatient Medications (Cardiovascular):  .  amLODipine-benazepril (LOTREL) 5-20 MG per capsule,      Current Outpatient Medications (Other):  Marland Kitchen  Diclofenac Sodium 2 % SOLN, Apply 1 pump twice daily. .  Diclofenac Sodium 2 % SOLN, Place 2 g onto the skin 2 (two) times daily. .  Vitamin D, Ergocalciferol, (DRISDOL) 1.25 MG (50000 UT) CAPS capsule, Take 1 capsule (50,000 Units total) by mouth every 7 (seven) days.    Past medical history, social, surgical and family history all reviewed in electronic medical record.  Kenneth [name] pertanent information unless stated regarding to the chief complaint.   Review of Systems:  Kenneth [name] headache, visual changes, nausea, vomiting, diarrhea, constipation, dizziness, abdominal pain, skin rash, fevers, chills, night sweats, weight loss, swollen lymph nodes, body aches, joint swelling, muscle aches, chest pain, shortness of breath, mood changes.   Objective  Blood pressure 122/86, pulse 96, height 5\' 10"  (1.778 m), weight 274 lb (124.3 kg), SpO2 96 %.  General: Kenneth [name] apparent distress alert and oriented x3 mood and affect normal, dressed appropriately.  HEENT: Pupils equal, extraocular movements intact  Respiratory: Patient's speak in full sentences and does not appear short of breath  Cardiovascular:  Kenneth [name] lower extremity edema, non tender, Kenneth [name] erythema  Skin: Warm dry intact with Kenneth [name] signs of infection or rash on extremities or on axial skeleton.  Abdomen: Soft nontender  Neuro: Cranial nerves II through XII are intact, neurovascularly intact in all extremities with 2+ DTRs and 2+ pulses.  Lymph: Kenneth [name] lymphadenopathy of posterior or anterior cervical chain or axillae bilaterally.  Gait normal with good balance and coordination.  MSK:  Non tender with full range of motion and good stability and symmetric strength and tone of shoulders, elbows, wrist, hip, and ankles bilaterally.  Knee exam bilaterally still show some arthritic  changes with varus deformity.  Patient does have some mild instability with valgus force.  Some abnormal thigh to calf ratio.  Near full range of motion.  Less swelling than previous exam.    Impression and Recommendations:      The above documentation has been reviewed and is accurate and complete Kenneth Pulley, DO       Note: This dictation was prepared with Dragon dictation along with smaller phrase technology. Any transcriptional errors that result from this process are unintentional.

## 2019-01-10 ENCOUNTER — Ambulatory Visit: Payer: BLUE CROSS/BLUE SHIELD | Admitting: Family Medicine

## 2019-05-04 DIAGNOSIS — K219 Gastro-esophageal reflux disease without esophagitis: Secondary | ICD-10-CM | POA: Diagnosis not present

## 2019-05-04 DIAGNOSIS — N529 Male erectile dysfunction, unspecified: Secondary | ICD-10-CM | POA: Diagnosis not present

## 2019-05-04 DIAGNOSIS — I10 Essential (primary) hypertension: Secondary | ICD-10-CM | POA: Diagnosis not present

## 2019-05-22 NOTE — Progress Notes (Signed)
[name] [address] [place], [place] [address] Phone: [phone] Subjective:     CC: Left knee pain, left elbow swelling  VFI:[id number]   12/01/2018: Stable.  Doing very well.  Encourage patient to continue the conservative therapy as long as he does well and no need for any type of injection.  Finish up with once weekly vitamin D for another 8 weeks.  Topical anti-inflammatories encouraged.  Follow-up as needed  Update 05/23/2019: Kenneth [name] is a 58 y.o. male coming in with complaint of left knee and elbow pain. Patient states that he is having some knee pain. Wants to discuss options. Does wear a brace but has a hard time keeping it on as it is uncomfortable.  Still having some instability.  Patient states that the pain is getting to the point that is stopping from activities.  Having swelling over olecranon on left side. Is having achy sensation when he puts his elbow on a hard surface. Has had swelling for one year.  Patient since then continues to have discomfort.      No past medical history on file. No past surgical history on file. Social History   Socioeconomic History  . Marital status: Married    Spouse name: Not on file  . Number of children: Not on file  . Years of education: Not on file  . Highest education level: Not on file  Occupational History  . Not on file  Social Needs  . Financial resource strain: Not on file  . Food insecurity    Worry: Not on file    Inability: Not on file  . Transportation needs    Medical: Not on file    Non-medical: Not on file  Tobacco Use  . Smoking status: Current Some Day Smoker  Substance and Sexual Activity  . Alcohol use: Not on file  . Drug use: Not on file  . Sexual activity: Not on file  Lifestyle  . Physical activity    Days per week: Not on file    Minutes per session: Not on file  . Stress: Not on file  Relationships  . Social Herbalist on phone: Not on  file    Gets together: Not on file    Attends religious service: Not on file    Active member of club or organization: Not on file    Attends meetings of clubs or organizations: Not on file    Relationship status: Not on file  Other Topics Concern  . Not on file  Social History Narrative  . Not on file   Not on File No family history on file.   Current Outpatient Medications (Cardiovascular):  .  amLODipine-benazepril (LOTREL) 5-20 MG per capsule,      Current Outpatient Medications (Other):  Marland Kitchen  Diclofenac Sodium 2 % SOLN, Apply 1 pump twice daily. .  Diclofenac Sodium 2 % SOLN, Place 2 g onto the skin 2 (two) times daily. .  Vitamin D, Ergocalciferol, (DRISDOL) 1.25 MG (50000 UT) CAPS capsule, Take 1 capsule (50,000 Units total) by mouth every 7 (seven) days.    Past medical history, social, surgical and family history all reviewed in electronic medical record.  No pertanent information unless stated regarding to the chief complaint.   Review of Systems:  No headache, visual changes, nausea, vomiting, diarrhea, constipation, dizziness, abdominal pain, skin rash, fevers, chills, night sweats, weight loss, swollen lymph nodes, body aches, joint swelling, chest  pain, shortness of breath, mood changes.  Positive muscle aches  Objective  Blood pressure 118/78, pulse 85, height 5\' 10"  (1.778 m), weight 274 lb (124.3 kg), SpO2 97 %.   General: No apparent distress alert and oriented x3 mood and affect normal, dressed appropriately.  HEENT: Pupils equal, extraocular movements intact  Respiratory: Patient's speak in full sentences and does not appear short of breath  Cardiovascular: No lower extremity edema, non tender, no erythema  Skin: Warm dry intact with no signs of infection or rash on extremities or on axial skeleton.  Abdomen: Soft nontender  Neuro: Cranial nerves II through XII are intact, neurovascularly intact in all extremities with 2+ DTRs and 2+ pulses.  Lymph: No  lymphadenopathy of posterior or anterior cervical chain or axillae bilaterally.  Gait antalgic MSK:  tender with full range of motion and good stability and symmetric strength and tone of shoulders, , wrist, hip, knee and ankles bilaterally.   Left elbow shows that patient is olecranon bursitis calcifying.  Full movement.  Tender to palpation.  Full range of motion of the elbow noted.  Limited musculoskeletal ultrasound was performed and interpreted by Lyndal Pulley  Limited ultrasound of patient's left elbow shows that there is a calcified bursa noted.  No abnormal neovascularization in the surrounding area. Knee: Left valgus deformity noted.  Abnormal thigh to calf ratio.  Tender to palpation over medial and PF joint line.  ROM full in flexion and extension and lower leg rotation. instability with valgus force.  painful patellar compression. Patellar glide with moderate crepitus. Patellar and quadriceps tendons unremarkable. Hamstring and quadriceps strength is normal. Contralateral knee shows mild arthritic changes as well  After informed written and verbal consent, patient was seated on exam table. Left knee was prepped with alcohol swab and utilizing anterolateral approach, patient's left knee space was injected with 4:1  marcaine 0.5%: Kenalog 40mg /dL. Patient tolerated the procedure well without immediate complications.    Impression and Recommendations:     This case required medical decision making of moderate complexity. The above documentation has been reviewed and is accurate and complete Lyndal Pulley, DO       Note: This dictation was prepared with Dragon dictation along with smaller phrase technology. Any transcriptional errors that result from this process are unintentional.

## 2019-05-23 ENCOUNTER — Other Ambulatory Visit: Payer: Self-pay

## 2019-05-23 ENCOUNTER — Encounter: Payer: Self-pay | Admitting: Family Medicine

## 2019-05-23 ENCOUNTER — Ambulatory Visit: Payer: Self-pay

## 2019-05-23 ENCOUNTER — Ambulatory Visit: Payer: BLUE CROSS/BLUE SHIELD | Admitting: Family Medicine

## 2019-05-23 VITALS — BP 118/78 | HR 85 | Ht 70.0 in | Wt 274.0 lb

## 2019-05-23 DIAGNOSIS — M1712 Unilateral primary osteoarthritis, left knee: Secondary | ICD-10-CM | POA: Diagnosis not present

## 2019-05-23 DIAGNOSIS — M25522 Pain in left elbow: Secondary | ICD-10-CM

## 2019-05-23 DIAGNOSIS — M65222 Calcific tendinitis, left upper arm: Secondary | ICD-10-CM | POA: Insufficient documentation

## 2019-05-23 NOTE — Assessment & Plan Note (Signed)
Number of the olecranon bursa.  Discussed compression, and discussed the possibility of injection and aspiration which patient declined. Patient could have surgical removal but patient also declined that.  Patient is to increase activity as tolerated.  Follow-up again in 4 to 6 weeks.

## 2019-05-23 NOTE — Patient Instructions (Addendum)
Injected the knee today See me again 4-6 weeks

## 2019-05-23 NOTE — Assessment & Plan Note (Signed)
Patient given injection today.  Tolerated the procedure well.  Discussed icing regimen and home exercises.  Discussed which activities to do which wants to avoid.  Patient was to increase activity slowly over the course the next several days.  Discussed icing regimen.  Follow-up again in 4 to 8 weeks patient could be a candidate for Visco supplementation

## 2019-06-02 DIAGNOSIS — E291 Testicular hypofunction: Secondary | ICD-10-CM | POA: Diagnosis not present

## 2019-06-02 DIAGNOSIS — I10 Essential (primary) hypertension: Secondary | ICD-10-CM | POA: Diagnosis not present

## 2019-06-07 DIAGNOSIS — I10 Essential (primary) hypertension: Secondary | ICD-10-CM | POA: Diagnosis not present

## 2019-06-07 DIAGNOSIS — K219 Gastro-esophageal reflux disease without esophagitis: Secondary | ICD-10-CM | POA: Diagnosis not present

## 2019-06-07 DIAGNOSIS — N529 Male erectile dysfunction, unspecified: Secondary | ICD-10-CM | POA: Diagnosis not present

## 2019-06-07 DIAGNOSIS — E291 Testicular hypofunction: Secondary | ICD-10-CM | POA: Diagnosis not present

## 2019-06-09 ENCOUNTER — Encounter: Payer: Self-pay | Admitting: Family Medicine

## 2019-06-21 DIAGNOSIS — N183 Chronic kidney disease, stage 3 (moderate): Secondary | ICD-10-CM | POA: Diagnosis not present

## 2019-07-21 DIAGNOSIS — I129 Hypertensive chronic kidney disease with stage 1 through stage 4 chronic kidney disease, or unspecified chronic kidney disease: Secondary | ICD-10-CM | POA: Diagnosis not present

## 2019-07-21 DIAGNOSIS — Z720 Tobacco use: Secondary | ICD-10-CM | POA: Diagnosis not present

## 2019-07-21 DIAGNOSIS — K219 Gastro-esophageal reflux disease without esophagitis: Secondary | ICD-10-CM | POA: Diagnosis not present

## 2019-07-21 DIAGNOSIS — N183 Chronic kidney disease, stage 3 (moderate): Secondary | ICD-10-CM | POA: Diagnosis not present

## 2019-07-27 DIAGNOSIS — N183 Chronic kidney disease, stage 3 (moderate): Secondary | ICD-10-CM | POA: Diagnosis not present

## 2019-08-04 ENCOUNTER — Other Ambulatory Visit: Payer: Self-pay | Admitting: Nephrology

## 2019-08-04 DIAGNOSIS — N183 Chronic kidney disease, stage 3 unspecified: Secondary | ICD-10-CM

## 2019-08-18 ENCOUNTER — Ambulatory Visit
Admission: RE | Admit: 2019-08-18 | Discharge: 2019-08-18 | Disposition: A | Discharge status: Home / Self Care | Payer: BC Managed Care – PPO | Source: Ambulatory Visit | Attending: Nephrology | Admitting: Nephrology

## 2019-08-18 DIAGNOSIS — N183 Chronic kidney disease, stage 3 unspecified: Secondary | ICD-10-CM | POA: Diagnosis not present

## 2019-08-18 DIAGNOSIS — N281 Cyst of kidney, acquired: Secondary | ICD-10-CM | POA: Diagnosis not present

## 2019-11-06 ENCOUNTER — Ambulatory Visit
Admission: EM | Admit: 2019-11-06 | Discharge: 2019-11-06 | Disposition: A | Discharge status: Home / Self Care | Payer: BC Managed Care – PPO | Attending: Emergency Medicine | Admitting: Emergency Medicine

## 2019-11-06 ENCOUNTER — Other Ambulatory Visit: Payer: Self-pay

## 2019-11-06 DIAGNOSIS — Z20828 Contact with and (suspected) exposure to other viral communicable diseases: Secondary | ICD-10-CM

## 2019-11-06 DIAGNOSIS — R05 Cough: Secondary | ICD-10-CM

## 2019-11-06 DIAGNOSIS — R059 Cough, unspecified: Secondary | ICD-10-CM

## 2019-11-06 DIAGNOSIS — Z20822 Contact with and (suspected) exposure to covid-19: Secondary | ICD-10-CM

## 2019-11-06 MED ORDER — BENZONATATE 100 MG PO CAPS: 100.0000 mg | ORAL_CAPSULE | Freq: Three times a day (TID) | ORAL | 0 refills | Status: DC

## 2019-11-06 NOTE — ED Provider Notes (Signed)
[facility]    CSN: [id number] Arrival date & time: 11/06/19  [id number]      History   Chief Complaint Chief Complaint  Patient presents with  . Cough    HPI Kenneth [name] is a 58 y.o. male.   Kenneth [name] 26 y old male presented to the urgent care with a complaint of cough for the past 5 days.  Denies sick exposure to COVID, flu or strep.  Denies recent travel.  Denies aggravating or alleviating symptoms.  Denies previous COVID infection.   Denies fever, chills, fatigue, nasal congestion, rhinorrhea, sore throat, SOB, wheezing, chest pain, nausea, vomiting, changes in bowel or bladder habits.    The history is provided by the patient.  Cough   History reviewed. No pertinent past medical history.  Patient Active Problem List   Diagnosis Date Noted  . Calcific tendinitis of left elbow 05/23/2019  . Degenerative joint disease of knee, left 10/27/2018  . Calcific Achilles tendinitis 06/20/2015  . HYPOGONADISM 04/13/2009  . LOW HDL 04/13/2009  . ANKLE PAIN, RIGHT 04/13/2009  . ERECTILE DYSFUNCTION 03/02/2009  . ALLERGIC RHINITIS 03/02/2009  . FATIGUE 03/02/2009  . ESSENTIAL HYPERTENSION 04/05/2008  . PSORIASIS 06/15/2007  . TENDINITIS, ELBOW 06/15/2007  . CHONDROMALACIA 06/15/2007  . LIVER FUNCTION TESTS, ABNORMAL 06/15/2007    History reviewed. No pertinent surgical history.     Home Medications    Prior to Admission medications   Medication Sig Start Date End Date Taking? Authorizing Provider  amLODipine-benazepril (LOTREL) 5-20 MG per capsule  06/18/15   [provider]  benzonatate (TESSALON) 100 MG capsule Take 1 capsule (100 mg total) by mouth every 8 (eight) hours. 11/06/19   Salvador Coupe, Darrelyn Hillock, FNP  Diclofenac Sodium 2 % SOLN Apply 1 pump twice daily. 06/20/15   Lyndal Pulley, DO  Diclofenac Sodium 2 % SOLN Place 2 g onto the skin 2 (two) times daily. 10/27/18   Lyndal Pulley, DO  Vitamin D, Ergocalciferol, (DRISDOL) 1.25 MG  (50000 UT) CAPS capsule Take 1 capsule (50,000 Units total) by mouth every 7 (seven) days. 10/27/18   Lyndal Pulley, DO    Family History History reviewed. No pertinent family history.  Social History Social History   Tobacco Use  . Smoking status: Current Some Day Smoker  . Smokeless tobacco: Never Used  Substance Use Topics  . Alcohol use: Not Currently    Alcohol/week: 0.0 standard drinks  . Drug use: Not Currently     Allergies   Patient has no known allergies.   Review of Systems Review of Systems  Constitutional: Negative.   HENT: Negative.   Respiratory: Positive for cough.   Cardiovascular: Negative.   Gastrointestinal: Negative.   Neurological: Negative.   ROS: All other are negatives   Physical Exam Triage Vital Signs ED Triage Vitals  Enc Vitals Group     BP 11/06/19 0842 (!) 149/81     Pulse Rate 11/06/19 0842 87     Resp 11/06/19 0842 17     Temp 11/06/19 0842 97.8 F (36.6 C)     Temp Source 11/06/19 0842 Oral     SpO2 11/06/19 0842 96 %     Weight --      Height --      Head Circumference --      Peak Flow --      Pain Score 11/06/19 0847 0     Pain Loc --      Pain Edu? --  Excl. in GC? --    No data found.  Updated Vital Signs BP (!) 149/81 (BP Location: Right Arm)   Pulse 87   Temp 97.8 F (36.6 C) (Oral)   Resp 17   SpO2 96%   Visual Acuity Right Eye Distance:   Left Eye Distance:   Bilateral Distance:    Right Eye Near:   Left Eye Near:    Bilateral Near:     Physical Exam Vitals and nursing note reviewed.  Constitutional:      General: He is not in acute distress.    Appearance: Normal appearance. He is normal weight. He is not ill-appearing or toxic-appearing.  HENT:     Head: Normocephalic.     Right Ear: Tympanic membrane, ear canal and external ear normal. There is no impacted cerumen.     Left Ear: Ear canal and external ear normal. There is no impacted cerumen.     Nose: Nose normal. No congestion.      Mouth/Throat:     Mouth: Mucous membranes are moist.     Pharynx: No oropharyngeal exudate or posterior oropharyngeal erythema.  Cardiovascular:     Rate and Rhythm: Normal rate and regular rhythm.     Pulses: Normal pulses.     Heart sounds: Normal heart sounds. No murmur.  Pulmonary:     Effort: Pulmonary effort is normal. No respiratory distress.     Breath sounds: No wheezing or rhonchi.  Chest:     Chest wall: No tenderness.  Abdominal:     General: Abdomen is flat. Bowel sounds are normal. There is no distension.     Palpations: There is no mass.  Skin:    Capillary Refill: Capillary refill takes less than 2 seconds.  Neurological:     Mental Status: He is alert and oriented to person, place, and time.      UC Treatments / Results  Labs (all labs ordered are listed, but only abnormal results are displayed) Labs Reviewed  NOVEL CORONAVIRUS, NAA    EKG   Radiology No results found.  Procedures Procedures (including critical care time)  Medications Ordered in UC Medications - No data to display  Initial Impression / Assessment and Plan / UC Course  I have reviewed the triage vital signs and the nursing notes.  Pertinent labs & imaging results that were available during my care of the patient were reviewed by me and considered in my medical decision making (see chart for details).    Patient stable for discharge.  Benign physical exam.  COVID-19 test was ordered.  Advised patient to quarantine until results become available.  Tessalon Perles was prescribed to manage cough.  Advised patient to return for worsening symptoms.  Final Clinical Impressions(s) / UC Diagnoses   Final diagnoses:  Suspected COVID-19 virus infection  Cough     Discharge Instructions     COVID testing ordered.  It will take between 2-7 days for test results.  Someone will contact you regarding abnormal results.    In the meantime: You should remain isolated in your home for 10  days from symptom onset Get plenty of rest and push fluids Tessalon Perles prescribed for cough Use medications daily for symptom relief Use OTC medications like ibuprofen or tylenol as needed fever or pain Call or go to the ED if you have any new or worsening symptoms such as fever, worsening cough, shortness of breath, chest tightness, chest pain, turning blue, changes in mental status, etc..Marland Kitchen  ED Prescriptions    Medication Sig Dispense Auth. Provider   benzonatate (TESSALON) 100 MG capsule Take 1 capsule (100 mg total) by mouth every 8 (eight) hours. 21 capsule Tashanna Dolin, Darrelyn Hillock, FNP     PDMP not reviewed this encounter.   Emerson Monte, [place] 11/06/19 [phone]

## 2019-11-06 NOTE — ED Triage Notes (Signed)
Pt presents with cough that developed on Tuesday

## 2019-11-06 NOTE — Discharge Instructions (Addendum)
COVID testing ordered.  It will take between 2-7 days for test results.  Someone will contact you regarding abnormal results.    In the meantime: You should remain isolated in your home for 10 days from symptom onset Get plenty of rest and push fluids Tessalon Perles prescribed for cough Use medications daily for symptom relief Use OTC medications like ibuprofen or tylenol as needed fever or pain Call or go to the ED if you have any new or worsening symptoms such as fever, worsening cough, shortness of breath, chest tightness, chest pain, turning blue, changes in mental status, etc..Marland Kitchen

## 2019-11-07 LAB — NOVEL CORONAVIRUS, NAA: SARS-CoV-2, NAA: NOT DETECTED

## 2020-01-09 DIAGNOSIS — H2512 Age-related nuclear cataract, left eye: Secondary | ICD-10-CM | POA: Diagnosis not present

## 2020-01-09 DIAGNOSIS — H04123 Dry eye syndrome of bilateral lacrimal glands: Secondary | ICD-10-CM | POA: Diagnosis not present

## 2020-02-07 ENCOUNTER — Other Ambulatory Visit: Payer: Self-pay

## 2020-02-07 ENCOUNTER — Ambulatory Visit: Payer: Self-pay

## 2020-02-07 ENCOUNTER — Ambulatory Visit (INDEPENDENT_AMBULATORY_CARE_PROVIDER_SITE_OTHER): Payer: BC Managed Care – PPO | Admitting: Orthopaedic Surgery

## 2020-02-07 ENCOUNTER — Encounter: Payer: Self-pay | Admitting: Orthopaedic Surgery

## 2020-02-07 VITALS — BP 147/85 | HR 90 | Ht 71.0 in | Wt 258.0 lb

## 2020-02-07 DIAGNOSIS — G8929 Other chronic pain: Secondary | ICD-10-CM

## 2020-02-07 DIAGNOSIS — M7541 Impingement syndrome of right shoulder: Secondary | ICD-10-CM

## 2020-02-07 DIAGNOSIS — M25522 Pain in left elbow: Secondary | ICD-10-CM | POA: Diagnosis not present

## 2020-02-07 DIAGNOSIS — M25511 Pain in right shoulder: Secondary | ICD-10-CM | POA: Diagnosis not present

## 2020-02-07 NOTE — Progress Notes (Signed)
Office Visit Note   Patient: Kenneth [name]           Date of Birth: 07-12-1961           MRN: [id number] Visit Date: 02/07/2020              Requested by: Tower, [name], MD [place],  [place] [address] PCP: Abner Greenspan, MD   Assessment & Plan: Visit Diagnoses:  1. Chronic right shoulder pain   2. Pain in left elbow     Plan: Aspiration performed left olecranon bursa.  White cheesy aspirate in the long string 2 cc was obtained.  No bloody fluid no clear or serous fluid.  We discussed that this was a cyst and olecranon bursa was not infected.  He can have it excised if it persistently bothers him as an outpatient. Right subacromial injection performed with good relief of Neer test.  Activity modification discussed to call if he is having persistent problems. Follow-Up Instructions: No follow-ups on file.   Orders:  Orders Placed This Encounter  Procedures  . XR Elbow 2 Views Left  . XR Shoulder Right   No orders of the defined types were placed in this encounter.     Procedures: Large Joint Inj: R subacromial bursa on 02/07/2020 4:33 PM Indications: pain Details: 22 G 1.5 in needle  Arthrogram: No  Medications: 4 mL bupivacaine 0.25 %; 40 mg methylPREDNISolone acetate 40 MG/ML; 0.5 mL lidocaine 1 % Outcome: tolerated well, no immediate complications Procedure, treatment alternatives, risks and benefits explained, specific risks discussed. Consent was given by the patient. Immediately prior to procedure a time out was called to verify the correct patient, procedure, equipment, support staff and site/side marked as required. Patient was prepped and draped in the usual sterile fashion.       Clinical Data: No additional findings.   Subjective: Chief Complaint  Patient presents with  . Right Shoulder - Pain  . 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  He has had pain particularly with outstretched overhead activities.  Dumbbell curls of bother him.  Since his right elbow surgery removal loose body 2008 he had follow recommendations to back down and waits to less than half just to maintain toning and has had significant improvement in his joint pain symptoms until the last 2 months.  He has had some pain and swelling over the olecranon bursa of his left elbow without history of trauma.  Feels rather firm he is not had any cellulitis no drainage no history of gout.  Olecranon bursa on the left bothers him if he bumps the elbow has to be careful in positioning.  Right shoulder does well as long as he does not do outstretched reaching overhead reaching.  Review of Systems positive history of Achilles tendinitis.  Knee chondromalacia.  Loose body right elbow removed 2008.  Low HDL otherwise noncontributory.   Objective: Vital Signs: BP (!) 147/85   Pulse 90   Ht 5\' 11"  (1.803 m)   Wt 258 lb (117 kg)   BMI 35.98 kg/m   Physical Exam Constitutional:      Appearance: He is well-developed.  HENT:     Head: Normocephalic and atraumatic.  Eyes:     Pupils: Pupils are equal, round, and reactive to light.  Neck:     Thyroid: No thyromegaly.  Trachea: No tracheal deviation.  Cardiovascular:     Rate and Rhythm: Normal rate.  Pulmonary:     Effort: Pulmonary effort is normal.     Breath sounds: No wheezing.  Abdominal:     General: Bowel sounds are normal.     Palpations: Abdomen is soft.  Skin:    General: Skin is warm and dry.     Capillary Refill: Capillary refill takes less than 2 seconds.  Neurological:     Mental Status: He is alert and oriented to person, place, and time.  Psychiatric:        Behavior: Behavior normal.        Thought Content: Thought content normal.        Judgment: Judgment normal.     Ortho Exam patient has good cervical range of motion no brachial plexus tenderness right or left.  Positive  impingement right arm negative drop arm test that does reproduce some of his pain Long head of the biceps is normal.  No subluxation of the shoulder.  Well-healed arthroscopic portals right elbow.  Left elbow shows lack of full extension to 5 degrees good flexion.  There is firm olecranon bursal mass does not really feel like it is fluid. Specialty Comments:  No specialty comments available.  Imaging: No results found.   PMFS History: Patient Active Problem List   Diagnosis Date Noted  . Calcific tendinitis of left elbow 05/23/2019  . Degenerative joint disease of knee, left 10/27/2018  . Calcific Achilles tendinitis 06/20/2015  . HYPOGONADISM 04/13/2009  . LOW HDL 04/13/2009  . ANKLE PAIN, RIGHT 04/13/2009  . ERECTILE DYSFUNCTION 03/02/2009  . ALLERGIC RHINITIS 03/02/2009  . FATIGUE 03/02/2009  . ESSENTIAL HYPERTENSION 04/05/2008  . PSORIASIS 06/15/2007  . TENDINITIS, ELBOW 06/15/2007  . CHONDROMALACIA 06/15/2007  . LIVER FUNCTION TESTS, ABNORMAL 06/15/2007   No past medical history on file.  No family history on file.  No past surgical history on file. Social History   Occupational History  . Not on file  Tobacco Use  . Smoking status: Current Some Day Smoker  . Smokeless tobacco: Never Used  Substance and Sexual Activity  . Alcohol use: Not Currently    Alcohol/week: 0.0 standard drinks  . Drug use: Not Currently  . Sexual activity: Not on file

## 2020-02-08 MED ORDER — METHYLPREDNISOLONE ACETATE 40 MG/ML IJ SUSP
40.0000 mg | INTRAMUSCULAR | Status: AC | PRN
  Administered 2020-02-07: 40 mg via INTRA_ARTICULAR

## 2020-02-08 MED ORDER — LIDOCAINE HCL 1 % IJ SOLN
0.5000 mL | INTRAMUSCULAR | Status: AC | PRN
  Administered 2020-02-07: .5 mL

## 2020-02-08 MED ORDER — BUPIVACAINE HCL 0.25 % IJ SOLN
4.0000 mL | INTRAMUSCULAR | Status: AC | PRN
  Administered 2020-02-07: 4 mL via INTRA_ARTICULAR

## 2020-03-02 DIAGNOSIS — N182 Chronic kidney disease, stage 2 (mild): Secondary | ICD-10-CM | POA: Diagnosis not present

## 2020-03-02 DIAGNOSIS — Z720 Tobacco use: Secondary | ICD-10-CM | POA: Diagnosis not present

## 2020-03-02 DIAGNOSIS — H9390 Unspecified disorder of ear, unspecified ear: Secondary | ICD-10-CM | POA: Diagnosis not present

## 2020-03-02 DIAGNOSIS — N183 Chronic kidney disease, stage 3 unspecified: Secondary | ICD-10-CM | POA: Diagnosis not present

## 2020-03-02 DIAGNOSIS — I129 Hypertensive chronic kidney disease with stage 1 through stage 4 chronic kidney disease, or unspecified chronic kidney disease: Secondary | ICD-10-CM | POA: Diagnosis not present

## 2020-03-09 ENCOUNTER — Other Ambulatory Visit: Payer: Self-pay

## 2020-03-09 ENCOUNTER — Ambulatory Visit: Payer: BC Managed Care – PPO | Admitting: Orthopaedic Surgery

## 2020-03-09 ENCOUNTER — Encounter: Payer: Self-pay | Admitting: Orthopaedic Surgery

## 2020-03-09 DIAGNOSIS — R2232 Localized swelling, mass and lump, left upper limb: Secondary | ICD-10-CM | POA: Diagnosis not present

## 2020-03-09 NOTE — Progress Notes (Signed)
Office Visit Note   Patient: Kenneth [name]           Date of Birth: 21-Feb-1961           MRN: [id number] Visit Date: 03/09/2020              Requested by: Tower, [name], MD [place],  [place] [address] PCP: Abner Greenspan, MD   Assessment & Plan: Visit Diagnoses:  1. Elbow mass, left     Plan: Patient like to have the mass excised from his left elbow.  This could be done as an outpatient we discussed potential for hematoma formation in the pocket after the mass is excised.  Lesion will be sent to pathology for evaluation post resection.   Follow-Up Instructions: No follow-ups on file.   Orders:  No orders of the defined types were placed in this encounter.  No orders of the defined types were placed in this encounter.     Procedures: No procedures performed   Clinical Data: No additional findings.   Subjective: Chief Complaint  Patient presents with  . Left Elbow - Pain    HPI 59 year old male returns with persistent left olecranon bursal mass.  Previous aspiration 02/07/2020 showed some white cheese type material.  Patient has no history of gout.  He has noted some increase in size or more tenderness and has difficulty flexing his elbow.  It is bothering him with normal usage and has bumped it several times with increased pain and is requesting we schedule excision of the mass.  Review of Systems positive for history of hypertension knee chondromalacia tendinitis of his elbow.  Achilles calcific tendinopathy not currently symptomatic.   Objective: Vital Signs: BP (!) 148/92   Pulse 80   Ht 5\' 11"  (1.803 m)   Wt 258 lb (117 kg)   BMI 35.98 kg/m   Physical Exam Constitutional:      Appearance: He is well-developed.  HENT:     Head: Normocephalic and atraumatic.  Eyes:     Pupils: Pupils are equal, round, and reactive to light.  Neck:     Thyroid: No thyromegaly.     Trachea: No tracheal deviation.  Cardiovascular:     Rate and  Rhythm: Normal rate.  Pulmonary:     Effort: Pulmonary effort is normal.     Breath sounds: No wheezing.  Abdominal:     General: Bowel sounds are normal.     Palpations: Abdomen is soft.  Skin:    General: Skin is warm and dry.     Capillary Refill: Capillary refill takes less than 2 seconds.  Neurological:     Mental Status: He is alert and oriented to person, place, and time.  Psychiatric:        Behavior: Behavior normal.        Thought Content: Thought content normal.        Judgment: Judgment normal.     Ortho Exam patient has rapid cervical range of motion right and left without pain or discomfort.  Pain with flexion of his elbow past on the 35 degrees.  Firm olecranon bursal mass that almost the size of a ping-pong ball.  No cellulitis.  There is some tenderness at the distal aspect of the triceps tendon not palpable defect.  Good triceps strength reflexes are 2+ and symmetrical.  Specialty Comments:  No specialty comments available.  Imaging: No results found.   PMFS History: Patient Active Problem List  Diagnosis Date Noted  . Elbow mass, left 03/12/2020  . Calcific tendinitis of left elbow 05/23/2019  . Degenerative joint disease of knee, left 10/27/2018  . Calcific Achilles tendinitis 06/20/2015  . HYPOGONADISM 04/13/2009  . LOW HDL 04/13/2009  . ANKLE PAIN, RIGHT 04/13/2009  . ERECTILE DYSFUNCTION 03/02/2009  . ALLERGIC RHINITIS 03/02/2009  . FATIGUE 03/02/2009  . ESSENTIAL HYPERTENSION 04/05/2008  . PSORIASIS 06/15/2007  . TENDINITIS, ELBOW 06/15/2007  . CHONDROMALACIA 06/15/2007  . LIVER FUNCTION TESTS, ABNORMAL 06/15/2007   No past medical history on file.  No family history on file.  No past surgical history on file. Social History   Occupational History  . Not on file  Tobacco Use  . Smoking status: Current Some Day Smoker  . Smokeless tobacco: Never Used  Substance and Sexual Activity  . Alcohol use: Not Currently    Alcohol/week: 0.0  standard drinks  . Drug use: Not Currently  . Sexual activity: Not on file

## 2020-03-12 DIAGNOSIS — R2232 Localized swelling, mass and lump, left upper limb: Secondary | ICD-10-CM | POA: Insufficient documentation

## 2020-05-14 ENCOUNTER — Other Ambulatory Visit: Payer: Self-pay | Admitting: Orthopaedic Surgery

## 2020-05-14 DIAGNOSIS — L720 Epidermal cyst: Secondary | ICD-10-CM | POA: Diagnosis not present

## 2020-05-14 DIAGNOSIS — G8918 Other acute postprocedural pain: Secondary | ICD-10-CM | POA: Diagnosis not present

## 2020-05-14 DIAGNOSIS — M7022 Olecranon bursitis, left elbow: Secondary | ICD-10-CM | POA: Diagnosis not present

## 2020-05-14 MED ORDER — HYDROCODONE-ACETAMINOPHEN 5-325 MG PO TABS: 1.0000 | ORAL_TABLET | Freq: Four times a day (QID) | ORAL | 0 refills | Status: DC | PRN

## 2020-05-22 ENCOUNTER — Ambulatory Visit (INDEPENDENT_AMBULATORY_CARE_PROVIDER_SITE_OTHER): Payer: BC Managed Care – PPO | Admitting: Orthopaedic Surgery

## 2020-05-22 ENCOUNTER — Encounter: Payer: Self-pay | Admitting: Orthopaedic Surgery

## 2020-05-22 DIAGNOSIS — M7021 Olecranon bursitis, right elbow: Secondary | ICD-10-CM

## 2020-05-22 NOTE — Progress Notes (Signed)
   Post-Op Visit Note   Patient: Kenneth [name]           Date of Birth: November 05, 1961           MRN: [id number] Visit Date: 05/22/2020 PCP: Abner Greenspan, MD   Assessment & Plan:left elbow epidermoid cyst  Chief Complaint:  Chief Complaint  Patient presents with  . Left Elbow - Routine Post Op    05/14/2020 Excision of left olecranon bursa   Visit Diagnoses:  1. Olecranon bursitis, right elbow         Path report epidermoid cyst.   Plan: Dressing change Steri-Strips changed there is only slight puffiness.  Path report showed epidermoid cyst.  Location was typical for olecranon bursitis.  Recheck 2 weeks.  He will take his splint with him and applied only if he has increased swelling.  Follow-Up Instructions: No follow-ups on file.   Orders:  No orders of the defined types were placed in this encounter.  No orders of the defined types were placed in this encounter.   Imaging: No results found.  PMFS History: Patient Active Problem List   Diagnosis Date Noted  . Olecranon bursitis, right elbow 05/22/2020  . Elbow mass, left 03/12/2020  . Calcific tendinitis of left elbow 05/23/2019  . Degenerative joint disease of knee, left 10/27/2018  . Calcific Achilles tendinitis 06/20/2015  . HYPOGONADISM 04/13/2009  . LOW HDL 04/13/2009  . ANKLE PAIN, RIGHT 04/13/2009  . ERECTILE DYSFUNCTION 03/02/2009  . ALLERGIC RHINITIS 03/02/2009  . FATIGUE 03/02/2009  . ESSENTIAL HYPERTENSION 04/05/2008  . PSORIASIS 06/15/2007  . TENDINITIS, ELBOW 06/15/2007  . CHONDROMALACIA 06/15/2007  . LIVER FUNCTION TESTS, ABNORMAL 06/15/2007   No past medical history on file.  No family history on file.  No past surgical history on file. Social History   Occupational History  . Not on file  Tobacco Use  . Smoking status: Current Some Day Smoker  . Smokeless tobacco: Never Used  Substance and Sexual Activity  . Alcohol use: Not Currently    Alcohol/week: 0.0 standard drinks  . Drug use:  Not Currently  . Sexual activity: Not on file

## 2020-06-13 ENCOUNTER — Encounter: Payer: Self-pay | Admitting: Orthopaedic Surgery

## 2020-06-13 ENCOUNTER — Ambulatory Visit: Payer: BC Managed Care – PPO | Admitting: Orthopaedic Surgery

## 2020-06-13 VITALS — Ht 71.26 in | Wt 248.2 lb

## 2020-06-13 DIAGNOSIS — M7021 Olecranon bursitis, right elbow: Secondary | ICD-10-CM

## 2020-06-13 NOTE — Progress Notes (Signed)
   Post-Op Visit Note   Patient: Kenneth [name]           Date of Birth: [birth year]           MRN: [id number] Visit Date: 06/13/2020 PCP: Abner Greenspan, MD   Assessment & Plan: Post excision left elbow epidermoid cyst.  He has had some reaccumulation of fluid is requesting aspiration.  5 cc old blood aspirated.  He states he been lifting weights doing some bench presses and then noticed gradual recurrence of fluid in his elbow.  After aspiration compressive dressing applied he will skip doing arms for few days to the swelling down.  He got 50% decrease in the size.  Some this represents old blood clotted that cannot be aspirated with the 18-gauge needle.  Chief Complaint:  Chief Complaint  Patient presents with  . Left Elbow - Pain   Visit Diagnoses:  1. Olecranon bursitis, right elbow     Plan: Return as needed if he gets reaccumulation he can return.  Follow-Up Instructions: No follow-ups on file.   Orders:  No orders of the defined types were placed in this encounter.  No orders of the defined types were placed in this encounter.   Imaging: No results found.  PMFS History: Patient Active Problem List   Diagnosis Date Noted  . Olecranon bursitis, right elbow 05/22/2020  . Elbow mass, left 03/12/2020  . Calcific tendinitis of left elbow 05/23/2019  . Degenerative joint disease of knee, left 10/27/2018  . Calcific Achilles tendinitis 06/20/2015  . HYPOGONADISM 04/13/2009  . LOW HDL 04/13/2009  . ANKLE PAIN, RIGHT 04/13/2009  . ERECTILE DYSFUNCTION 03/02/2009  . ALLERGIC RHINITIS 03/02/2009  . FATIGUE 03/02/2009  . ESSENTIAL HYPERTENSION 04/05/2008  . PSORIASIS 06/15/2007  . TENDINITIS, ELBOW 06/15/2007  . CHONDROMALACIA 06/15/2007  . LIVER FUNCTION TESTS, ABNORMAL 06/15/2007   History reviewed. No pertinent past medical history.  History reviewed. No pertinent family history.  History reviewed. No pertinent surgical history. Social History   Occupational  History  . Not on file  Tobacco Use  . Smoking status: Current Some Day Smoker  . Smokeless tobacco: Never Used  Substance and Sexual Activity  . Alcohol use: Not Currently    Alcohol/week: 0.0 standard drinks  . Drug use: Not Currently  . Sexual activity: Not on file

## 2020-07-10 DIAGNOSIS — I10 Essential (primary) hypertension: Secondary | ICD-10-CM | POA: Diagnosis not present

## 2020-07-10 DIAGNOSIS — N529 Male erectile dysfunction, unspecified: Secondary | ICD-10-CM | POA: Diagnosis not present

## 2020-07-10 DIAGNOSIS — K219 Gastro-esophageal reflux disease without esophagitis: Secondary | ICD-10-CM | POA: Diagnosis not present

## 2020-07-10 DIAGNOSIS — N183 Chronic kidney disease, stage 3 unspecified: Secondary | ICD-10-CM | POA: Diagnosis not present

## 2020-08-14 NOTE — Progress Notes (Signed)
Kenneth [name] Phone: [phone] Subjective:   Fontaine No, am serving as a scribe for Dr. Hulan Saas. This visit occurred during the SARS-CoV-2 public health emergency.  Safety protocols were in place, including screening questions prior to the visit, additional usage of staff PPE, and extensive cleaning of exam room while observing appropriate contact time as indicated for disinfecting solutions.   I'm seeing this patient by the request  of:  Tower, [name], MD  CC: Left ankle and heel pain  WER:[id number]   05/23/2019 Number of the olecranon bursa.  Discussed compression, and discussed the possibility of injection and aspiration which patient declined. Patient could have surgical removal but patient also declined that.  Patient is to increase activity as tolerated.  Follow-up again in 4 to 6 weeks.   Update 08/15/2020 Kenneth [name] is a 59 y.o. male coming in with complaint of left achilles pain and right hip pain that radiates into right knee. Patient states he has had pain in left achilles for 3 months. Pain in right hip for 2 weeks. Patient has to stand for long periods. Pain improves once he is able to sit down. Patient was stretching when it did initially happen. Has had some increase in swelling. Ice has been helpful.    Patient was last seen multiple years ago for new calcific Achilles tendinitis.    Patient did have x-rays taken of the left elbow and a an outside facility.  Patient's read shows that patient does have some arthritic changes noted as well as a chronic olecranon bursitis  No past medical history on file. No past surgical history on file. Social History   Socioeconomic History  . Marital status: Married    Spouse name: Not on file  . Number of children: Not on file  . Years of education: Not on file  . Highest education level: Not on file  Occupational History  . Not on file    Tobacco Use  . Smoking status: Current Some Day Smoker  . Smokeless tobacco: Never Used  Substance and Sexual Activity  . Alcohol use: Not Currently    Alcohol/week: 0.0 standard drinks  . Drug use: Not Currently  . Sexual activity: Not on file  Other Topics Concern  . Not on file  Social History Narrative  . Not on file   Social Determinants of Health   Financial Resource Strain:   . Difficulty of Paying Living Expenses: Not on file  Food Insecurity:   . Worried About Charity fundraiser in the Last Year: Not on file  . Ran Out of Food in the Last Year: Not on file  Transportation Needs:   . Lack of Transportation (Medical): Not on file  . Lack of Transportation (Non-Medical): Not on file  Physical Activity:   . Days of Exercise per Week: Not on file  . Minutes of Exercise per Session: Not on file  Stress:   . Feeling of Stress : Not on file  Social Connections:   . Frequency of Communication with Friends and Family: Not on file  . Frequency of Social Gatherings with Friends and Family: Not on file  . Attends Religious Services: Not on file  . Active Member of Clubs or Organizations: Not on file  . Attends Archivist Meetings: Not on file  . Marital Status: Not on file   No Known Allergies No family history on file.  Current Outpatient Medications (Cardiovascular):  .  amLODipine-benazepril (LOTREL) 5-20 MG per capsule,   Current Outpatient Medications (Respiratory):  .  benzonatate (TESSALON) 100 MG capsule, Take 1 capsule (100 mg total) by mouth every 8 (eight) hours.  Current Outpatient Medications (Analgesics):  .  HYDROcodone-acetaminophen (NORCO/VICODIN) 5-325 MG tablet, Take 1-2 tablets by mouth every 6 (six) hours as needed for moderate pain.  Current Outpatient Medications (Hematological):  Marland Kitchen  Cyanocobalamin (VITAMIN B 12) 500 MCG TABS, Take by mouth. 2 daily  Current Outpatient Medications (Other):  Marland Kitchen  Arginine 1000 MG TABS, Take by mouth.  2 daily .  Cholecalciferol (VITAMIN D3) 125 MCG (5000 UT) CAPS, Take by mouth. 2 daily .  Diclofenac Sodium 2 % SOLN, Apply 1 pump twice daily. .  Diclofenac Sodium 2 % SOLN, Place 2 g onto the skin 2 (two) times daily. Marland Kitchen  omeprazole (PRILOSEC) 40 MG capsule, Take 40 mg by mouth daily. .  Turmeric ([facility] TURMERIC CURCUMIN) 500 MG CAPS, Take by mouth. .  Vitamin D, Ergocalciferol, (DRISDOL) 1.25 MG (50000 UT) CAPS capsule, Take 1 capsule (50,000 Units total) by mouth every 7 (seven) days.   Reviewed prior external information including notes and imaging from  primary care provider As well as notes that were available from care everywhere and other healthcare systems.  Past medical history, social, surgical and family history all reviewed in electronic medical record.  No pertanent information unless stated regarding to the chief complaint.   Review of Systems:  No headache, visual changes, nausea, vomiting, diarrhea, constipation, dizziness, abdominal pain, skin rash, fevers, chills, night sweats, weight loss, swollen lymph nodes, body aches, joint swelling, chest pain, shortness of breath, mood changes. POSITIVE muscle aches  Objective  Blood pressure 124/76, pulse 86, height 5\' 11"  (1.803 m), weight 253 lb (114.8 kg), SpO2 96 %.   General: No apparent distress alert and oriented x3 mood and affect normal, dressed appropriately.  HEENT: Pupils equal, extraocular movements intact  Respiratory: Patient's speak in full sentences and does not appear short of breath  Cardiovascular: No lower extremity edema, non tender, no erythema  Neuro: Cranial nerves II through XII are intact, neurovascularly intact in all extremities with 2+ DTRs and 2+ pulses.  Gait normal with good balance and coordination.  MSK: Left ankle shows the patient does have erythema near the insertion of the Achilles. No Haglund nodule noted. Does have some mild tightness noted. Negative Thompson. Patient is tender at the  insertion of the Achilles. Neurovascularly intact distally.  Right hip pain over the greater trochanteric area but good range of motion. Patient does have tightness of the pelvic girdle throughout.  Limited musculoskeletal ultrasound was performed and interpreted by Lyndal Pulley  Limited ultrasound of patient's left Achilles shows the patient does have some calcific changes that appears to be chronic with increasing neovascularization going throughout the tendon near the insertion that is consistent with a intersubstance tear mild hypoechoic changes in the area. No retraction noted. Impression: Chronic Achilles calcific changes with no acute partial intrasubstance thickness tear  97110; 15 additional minutes spent for Therapeutic exercises as stated in above notes.  This included exercises focusing on stretching, strengthening, with significant focus on eccentric aspects.   Long term goals include an improvement in range of motion, strength, endurance as well as avoiding reinjury. Patient's frequency would include in 1-2 times a day, 3-5 times a week for a duration of 6-12 weeks. Ankle strengthening that included:  Basic range of motion exercises  to allow proper full motion at ankle Stretching of the lower leg and hamstrings  Theraband exercises for the lower leg - inversion, eversion, dorsiflexion and plantarflexion each to be completed with a theraband Balance exercises to increase proprioception Weight bearing exercises to increase strength and balance  Proper technique shown and discussed handout in great detail with ATC.  All questions were discussed and answered.     Impression and Recommendations:     The above documentation has been reviewed and is accurate and complete Lyndal Pulley, DO       Note: This dictation was prepared with Dragon dictation along with smaller phrase technology. Any transcriptional errors that result from this process are unintentional.

## 2020-08-15 ENCOUNTER — Encounter: Payer: Self-pay | Admitting: Family Medicine

## 2020-08-15 ENCOUNTER — Other Ambulatory Visit: Payer: Self-pay

## 2020-08-15 ENCOUNTER — Ambulatory Visit: Payer: BC Managed Care – PPO | Admitting: Family Medicine

## 2020-08-15 ENCOUNTER — Ambulatory Visit: Payer: Self-pay

## 2020-08-15 VITALS — BP 124/76 | HR 86 | Ht 71.0 in | Wt 253.0 lb

## 2020-08-15 DIAGNOSIS — M7071 Other bursitis of hip, right hip: Secondary | ICD-10-CM | POA: Insufficient documentation

## 2020-08-15 DIAGNOSIS — M7061 Trochanteric bursitis, right hip: Secondary | ICD-10-CM | POA: Diagnosis not present

## 2020-08-15 DIAGNOSIS — M6528 Calcific tendinitis, other site: Secondary | ICD-10-CM

## 2020-08-15 DIAGNOSIS — M25572 Pain in left ankle and joints of left foot: Secondary | ICD-10-CM | POA: Diagnosis not present

## 2020-08-15 NOTE — Assessment & Plan Note (Signed)
Chronic problem with exacerbation. I do believe the patient does have a partial tear with the chronic calcium changes noted. We discussed with patient we will put patient in a cam walker for now. We discussed avoiding high impact exercises. Patient does do a lot of standing at work. Patient will follow up again in 4 weeks and hopefully we will progress patient appropriately.

## 2020-08-15 NOTE — Patient Instructions (Signed)
Cam walker daily for next 2-4 weeks In the house HOKA recovery sandal Start exercises in one week Continue with ice and Tylenol is ok See me again in 4 weeks or call if it gets worse and we will do an injection

## 2020-08-15 NOTE — Assessment & Plan Note (Signed)
Right hip bursitis. Discussed with patient-resume home exercise, which activities to do which wants to avoid. I do believe that it is secondary to compensation of the left Achilles. If worsening pain consider injection.

## 2020-08-28 DIAGNOSIS — L821 Other seborrheic keratosis: Secondary | ICD-10-CM | POA: Diagnosis not present

## 2020-08-28 DIAGNOSIS — D225 Melanocytic nevi of trunk: Secondary | ICD-10-CM | POA: Diagnosis not present

## 2020-08-28 DIAGNOSIS — L814 Other melanin hyperpigmentation: Secondary | ICD-10-CM | POA: Diagnosis not present

## 2020-08-28 DIAGNOSIS — L989 Disorder of the skin and subcutaneous tissue, unspecified: Secondary | ICD-10-CM | POA: Diagnosis not present

## 2020-08-28 DIAGNOSIS — D485 Neoplasm of uncertain behavior of skin: Secondary | ICD-10-CM | POA: Diagnosis not present

## 2020-08-28 DIAGNOSIS — D1801 Hemangioma of skin and subcutaneous tissue: Secondary | ICD-10-CM | POA: Diagnosis not present

## 2020-08-28 DIAGNOSIS — L570 Actinic keratosis: Secondary | ICD-10-CM | POA: Diagnosis not present

## 2020-09-04 DIAGNOSIS — L905 Scar conditions and fibrosis of skin: Secondary | ICD-10-CM | POA: Diagnosis not present

## 2020-09-04 DIAGNOSIS — D0361 Melanoma in situ of right upper limb, including shoulder: Secondary | ICD-10-CM | POA: Diagnosis not present

## 2020-09-12 ENCOUNTER — Ambulatory Visit: Payer: BC Managed Care – PPO | Admitting: Family Medicine

## 2020-10-28 ENCOUNTER — Telehealth: Payer: BC Managed Care – PPO | Admitting: Nurse Practitioner

## 2020-10-28 DIAGNOSIS — J208 Acute bronchitis due to other specified organisms: Secondary | ICD-10-CM | POA: Diagnosis not present

## 2020-10-28 MED ORDER — PREDNISONE 10 MG PO TABS: 10.0000 mg | ORAL_TABLET | ORAL | 0 refills | Status: AC

## 2020-10-28 MED ORDER — PROMETHAZINE-DM 6.25-15 MG/5ML PO SYRP: 5.0000 mL | ORAL_SOLUTION | Freq: Four times a day (QID) | ORAL | 0 refills | Status: AC | PRN

## 2020-10-28 NOTE — Progress Notes (Signed)
We are sorry that you are not feeling well.  Here is how we plan to help!  Based on your presentation I believe you most likely have A cough due to a virus.  This is called viral bronchitis and is best treated by rest, plenty of fluids and control of the cough.  You may use Ibuprofen or Tylenol as directed to help your symptoms.     In addition you may use Promethazine DM, take 88mL up to 3 times daily as needed for cough. This medication will most likely cause you to be drowsy.   Prednisone 10 mg daily for 6 days (see taper instructions below)  Directions for 6 day taper: Day 1: 2 tablets before breakfast, 1 after both lunch & dinner and 2 at bedtime Day 2: 1 tab before breakfast, 1 after both lunch & dinner and 2 at bedtime Day 3: 1 tab at each meal & 1 at bedtime Day 4: 1 tab at breakfast, 1 at lunch, 1 at bedtime Day 5: 1 tab at breakfast & 1 tab at bedtime Day 6: 1 tab at breakfast   While you are taking these medications, it is important to monitor your blood pressure. If your blood pressure becomes uncontrolled, follow up with your primary care physician as soon as possible.   From your responses in the eVisit questionnaire you describe inflammation in the upper respiratory tract which is causing a significant cough.  This is commonly called Bronchitis and has four common causes:    Allergies  Viral Infections  Acid Reflux  Bacterial Infection Allergies, viruses and acid reflux are treated by controlling symptoms or eliminating the cause. An example might be a cough caused by taking certain blood pressure medications. You stop the cough by changing the medication. Another example might be a cough caused by acid reflux. Controlling the reflux helps control the cough.  USE OF BRONCHODILATOR ("RESCUE") INHALERS: There is a risk from using your bronchodilator too frequently.  The risk is that over-reliance on a medication which only relaxes the muscles surrounding the breathing tubes  can reduce the effectiveness of medications prescribed to reduce swelling and congestion of the tubes themselves.  Although you feel brief relief from the bronchodilator inhaler, your asthma may actually be worsening with the tubes becoming more swollen and filled with mucus.  This can delay other crucial treatments, such as oral steroid medications. If you need to use a bronchodilator inhaler daily, several times per day, you should discuss this with your provider.  There are probably better treatments that could be used to keep your asthma under control.     HOME CARE . Only take medications as instructed by your medical team. . Complete the entire course of an antibiotic. . Drink plenty of fluids and get plenty of rest. . Avoid close contacts especially the very young and the elderly . Cover your mouth if you cough or cough into your sleeve. . Always remember to wash your hands . A steam or ultrasonic humidifier can help congestion.   GET HELP RIGHT AWAY IF: . You develop worsening fever. . You become short of breath . You cough up blood. . Your symptoms persist after you have completed your treatment plan MAKE SURE YOU   Understand these instructions.  Will watch your condition.  Will get help right away if you are not doing well or get worse.  Your e-visit answers were reviewed by a board certified advanced clinical practitioner to complete your personal care  plan.  Depending on the condition, your plan could have included both over the counter or prescription medications. If there is a problem please reply  once you have received a response from your provider. Your safety is important to [place].  If you have drug allergies check your prescription carefully.    You can use MyChart to ask questions about today's visit, request a non-urgent call back, or ask for a work or school excuse for 24 hours related to this e-Visit. If it has been greater than 24 hours you will need to follow up  with your provider, or enter a new e-Visit to address those concerns. You will get an e-mail in the next two days asking about your experience.  I hope that your e-visit has been valuable and will speed your recovery. Thank you for using e-visits.  I have spent at least 5 minutes reviewing and documenting in the patient's chart.

## 2020-10-28 NOTE — Progress Notes (Signed)
I also recommend COVID testing at this time. For your convenience, I have enclosed a list of testing sites.  Your current symptoms could be consistent with the coronavirus.  Many health care providers can now test patients at their office but not all are.  [facility] has multiple testing sites. For information on our COVID testing locations and hours go to [email or web address]   Testing Information: The COVID-19 Community Testing sites are testing BY APPOINTMENT ONLY.  You can schedule online at [email or web address]  If you do not have access to a smart phone or computer you may call [phone] for an appointment.   Additional testing sites in the Community:  . For [facility] Testing sites in [facility]  FaceUpdate.[email or web address]  . For Pop-up testing sites in [place]  BowlDirectory.co.uk  . For [facility] [email or web address]  . For [facility] testing in [place] and [employer] [email or web address]  . For [facility] testing in [facility]   [email or web address]  For  more information about community testing call [phone]

## 2020-11-27 ENCOUNTER — Encounter: Payer: Self-pay | Admitting: Orthopaedic Surgery

## 2020-11-27 ENCOUNTER — Ambulatory Visit: Payer: Self-pay

## 2020-11-27 ENCOUNTER — Ambulatory Visit: Payer: BC Managed Care – PPO | Admitting: Orthopaedic Surgery

## 2020-11-27 ENCOUNTER — Other Ambulatory Visit: Payer: Self-pay

## 2020-11-27 VITALS — BP 121/81 | HR 92 | Ht 71.5 in | Wt 257.0 lb

## 2020-11-27 DIAGNOSIS — M25521 Pain in right elbow: Secondary | ICD-10-CM

## 2020-11-27 DIAGNOSIS — M79642 Pain in left hand: Secondary | ICD-10-CM

## 2020-11-27 DIAGNOSIS — M25551 Pain in right hip: Secondary | ICD-10-CM

## 2020-11-27 NOTE — Progress Notes (Signed)
Office Visit Note   Patient: Kenneth [name]           Date of Birth: 1961/01/06           MRN: [id number] Visit Date: 11/27/2020              Requested by: Tower, [name], MD [place],  [place] [address] PCP: Abner Greenspan, MD   Assessment & Plan: Visit Diagnoses:  1. Pain in left hand   2. Pain in right hip   3. Pain in right elbow     Plan: We discussed the significant arthritis in his right elbow.  He needs to avoid bicep curls tricep exercises and just work on cardio work with treadmill, elliptical, bike etc.  Will obtain arthritis panel to make sure he does not have any underlying cause for multijoint symptoms.  We will call him with the results.  Follow-Up Instructions: No follow-ups on file.   Orders:  Orders Placed This Encounter  Procedures  . XR Elbow 2 Views Right  . XR HIP UNILAT W OR W/O PELVIS 2-3 VIEWS RIGHT  . XR Hand Complete Left   No orders of the defined types were placed in this encounter.     Procedures: No procedures performed   Clinical Data: No additional findings.   Subjective: Chief Complaint  Patient presents with  . Right Elbow - Pain  . Left Hand - Pain  . Right Hip - Pain    HPI 60 year old male returns post left microlumbar's excision of an epidermoid cyst.  He has had pain in his right elbow states many years ago he banged his elbow against the wall when he was working and with effort when he was pulling something gave way.  He has not had any rheumatologic lab work-up.  He states he did some bicep curls with weights over the weekend and then had significant increase right elbow pain and could not extend his elbow and states he could not get his hand to his mouth.  Today feels like it is better.  1225/21 he fell forward landing on his left hand and wrist and has had some discomfort difficulty making a fist and extending his fingers.  With activity has had some right anterior groin pain occasionally sharp mostly  deep aching type sensation that tends to be worse with more weightbearing.  Review of Systems positive for previous Achilles tendinitis, knee chondromalacia symptoms currently improved.  2008 removal loose body right elbow.  Olecranon bursitis left post excision 05/14/2020 with full range of motion left elbow..  All other systems noncontributory to HPI.   Objective: Vital Signs: BP 121/81   Pulse 92   Ht 5' 11.5" (1.816 m)   Wt 257 lb (116.6 kg)   BMI 35.34 kg/m   Physical Exam Constitutional:      Appearance: He is well-developed and well-nourished.  HENT:     Head: Normocephalic and atraumatic.  Eyes:     Extraocular Movements: EOM normal.     Pupils: Pupils are equal, round, and reactive to light.  Neck:     Thyroid: No thyromegaly.     Trachea: No tracheal deviation.  Cardiovascular:     Rate and Rhythm: Normal rate.  Pulmonary:     Effort: Pulmonary effort is normal.     Breath sounds: No wheezing.  Abdominal:     General: Bowel sounds are normal.     Palpations: Abdomen is soft.  Skin:  General: Skin is warm and dry.     Capillary Refill: Capillary refill takes less than 2 seconds.  Neurological:     Mental Status: He is alert and oriented to person, place, and time.  Psychiatric:        Mood and Affect: Mood and affect normal.        Behavior: Behavior normal.        Thought Content: Thought content normal.        Judgment: Judgment normal.     Ortho Exam patient has tenderness and right elbow effusion.  Range of motion is 20-130 degrees right elbow.  Left wrist has some tenderness over the dorsal wrist joint.  He can make full fist fingers reach full extension.  Well-healed olecranon bursa incision left elbow.  Full range of motion left elbow.  Specialty Comments:  No specialty comments available.  Imaging: No results found.   PMFS History: Patient Active Problem List   Diagnosis Date Noted  . Bursitis of right hip 08/15/2020  . Olecranon bursitis,  right elbow 05/22/2020  . Elbow mass, left 03/12/2020  . Calcific tendinitis of left elbow 05/23/2019  . Degenerative joint disease of knee, left 10/27/2018  . Calcific Achilles tendinitis 06/20/2015  . HYPOGONADISM 04/13/2009  . LOW HDL 04/13/2009  . ANKLE PAIN, RIGHT 04/13/2009  . ERECTILE DYSFUNCTION 03/02/2009  . ALLERGIC RHINITIS 03/02/2009  . FATIGUE 03/02/2009  . ESSENTIAL HYPERTENSION 04/05/2008  . PSORIASIS 06/15/2007  . TENDINITIS, ELBOW 06/15/2007  . CHONDROMALACIA 06/15/2007  . LIVER FUNCTION TESTS, ABNORMAL 06/15/2007   No past medical history on file.  No family history on file.  No past surgical history on file. Social History   Occupational History  . Not on file  Tobacco Use  . Smoking status: Current Some Day Smoker  . Smokeless tobacco: Never Used  Substance and Sexual Activity  . Alcohol use: Not Currently    Alcohol/week: 0.0 standard drinks  . Drug use: Not Currently  . Sexual activity: Not on file

## 2020-11-29 LAB — ANA: Anti Nuclear Antibody (ANA): NEGATIVE

## 2020-11-29 LAB — SEDIMENTATION RATE: Sed Rate: 28 mm/h — ABNORMAL HIGH (ref 0–20)

## 2020-11-29 LAB — RHEUMATOID FACTOR: Rheumatoid fact SerPl-aCnc: <14 IU/mL (ref <14)

## 2020-11-29 LAB — URIC ACID: Uric Acid, Serum: 7.5 mg/dL (ref 4.0–8.0)

## 2020-12-05 ENCOUNTER — Telehealth: Payer: BC Managed Care – PPO | Admitting: Family

## 2020-12-05 DIAGNOSIS — Z20822 Contact with and (suspected) exposure to covid-19: Secondary | ICD-10-CM

## 2020-12-05 MED ORDER — ALBUTEROL SULFATE HFA 108 (90 BASE) MCG/ACT IN AERS: 2.0000 | INHALATION_SPRAY | Freq: Four times a day (QID) | RESPIRATORY_TRACT | 0 refills | Status: DC | PRN

## 2020-12-05 MED ORDER — BENZONATATE 100 MG PO CAPS: 100.0000 mg | ORAL_CAPSULE | Freq: Three times a day (TID) | ORAL | 0 refills | Status: DC | PRN

## 2020-12-05 NOTE — Progress Notes (Signed)
E-Visit for Corona Virus Screening  Your current symptoms could be consistent with the coronavirus.  Many health care providers can now test patients at their office but not all are.  [name] has multiple testing sites. For information on our COVID testing locations and hours go to [email or web address]  We are enrolling you in our MyChart Home Monitoring for COVID19 . Daily you will receive a questionnaire within the MyChart website. Our COVID 19 response team will be monitoring your responses daily.  Testing Information: The COVID-19 Community Testing sites are testing BY APPOINTMENT ONLY.  You can schedule online at [email or web address]  If you do not have access to a smart phone or computer you may call [phone] for an appointment.   Additional testing sites in the Community:  . For [facility] Testing sites in [name]  https://www.cvs.com/minuteclinic/covid-19-testing  . For Pop-up testing sites in [place]  https://covid19.ncdhhs.gov/about-covid-19/testing/find-my-testing-place/pop-testing-sites  . For [facility] https://[place].gov[place]ounty/human-services/health-department/coronavirus-covid-19-info/covid-19-testing  . For [place] testing in [place] https://www.[place].gov/[place]-c[place]/human-services/health-department/coronavirus-covid-19-info/covid-19-testing  . For [facility] testing in [place]   https://[facility].care/covidtesting  For  more information about community testing call [phone]   Please quarantine yourself while awaiting your test results. Please stay home for a minimum of 10 days from the first day of illness with improving symptoms and you have had 24 hours of no fever (without the use of Tylenol (Acetaminophen) Motrin (Ibuprofen) or any fever reducing medication).  Also - Do not get tested prior to returning to work because once you have had a positive test the test can stay  positive for more than a month in some cases.   You should wear a mask or cloth face covering over your nose and mouth if you must be around other people or animals, including pets (even at home). Try to stay at least 6 feet away from other people. This will protect the people around you.  Please continue good preventive care measures, including:  frequent hand-washing, avoid touching your face, cover coughs/sneezes, stay out of crowds and keep a 6 foot distance from others.  COVID-19 is a respiratory illness with symptoms that are similar to the flu. Symptoms are typically mild to moderate, but there have been cases of severe illness and death due to the virus.   The following symptoms may appear 2-14 days after exposure: . Fever . Cough . Shortness of breath or difficulty breathing . Chills . Repeated shaking with chills . Muscle pain . Headache . Sore throat . New loss of taste or smell . Fatigue . Congestion or runny nose . Nausea or vomiting . Diarrhea  Go to the nearest hospital ED for assessment if fever/cough/breathlessness are severe or illness seems like a threat to life.  It is vitally important that if you feel that you have an infection such as this virus or any other virus that you stay home and away from places where you may spread it to others.  You should avoid contact with people age 65 and older.   You can use medication such as A prescription cough medication called Tessalon Perles 100 mg. You may take 1-2 capsules every 8 hours as needed for cough and A prescription inhaler called Albuterol MDI 90 mcg /actuation 2 puffs every 4 hours as needed for shortness of breath, wheezing, cough  You may also take acetaminophen (Tylenol) as needed for fever.  Reduce your risk of any infection by using the same precautions used for avoiding the common cold or flu:  .   Wash your hands often with soap and warm water for at least 20 seconds.  If soap and water are not readily available,  use an alcohol-based hand sanitizer with at least 60% alcohol.  . If coughing or sneezing, cover your mouth and nose by coughing or sneezing into the elbow areas of your shirt or coat, into a tissue or into your sleeve (not your hands). . Avoid shaking hands with others and consider head nods or verbal greetings only. . Avoid touching your eyes, nose, or mouth with unwashed hands.  . Avoid close contact with people who are sick. . Avoid places or events with large numbers of people in one location, like concerts or sporting events. . Carefully consider travel plans you have or are making. . If you are planning any travel outside or inside the [place], visit the CDC's Travelers' Health webpage for the latest health notices. . If you have some symptoms but not all symptoms, continue to monitor at home and seek medical attention if your symptoms worsen. . If you are having a medical emergency, call 911.  HOME CARE . Only take medications as instructed by your medical team. . Drink plenty of fluids and get plenty of rest. . A steam or ultrasonic humidifier can help if you have congestion.   GET HELP RIGHT AWAY IF YOU HAVE EMERGENCY WARNING SIGNS** FOR COVID-19. If you or someone is showing any of these signs seek emergency medical care immediately. Call 911 or proceed to your closest emergency facility if: . You develop worsening high fever. . Trouble breathing . Bluish lips or face . Persistent pain or pressure in the chest . New confusion . Inability to wake or stay awake . You cough up blood. . Your symptoms become more severe  **This list is not all possible symptoms. Contact your medical provider for any symptoms that are sever or concerning to you.  MAKE SURE YOU   Understand these instructions.  Will watch your condition.  Will get help right away if you are not doing well or get worse.  Your e-visit answers were reviewed by a board certified advanced clinical practitioner to  complete your personal care plan.  Depending on the condition, your plan could have included both over the counter or prescription medications.  If there is a problem please reply once you have received a response from your provider.  Your safety is important to us.  If you have drug allergies check your prescription carefully.    You can use MyChart to ask questions about today's visit, request a non-urgent call back, or ask for a work or school excuse for 24 hours related to this e-Visit. If it has been greater than 24 hours you will need to follow up with your provider, or enter a new e-Visit to address those concerns. You will get an e-mail in the next two days asking about your experience.  I hope that your e-visit has been valuable and will speed your recovery. Thank you for using e-visits.   Approximately 5 minutes was spent documenting and reviewing patient's chart.  

## 2020-12-10 NOTE — Addendum Note (Signed)
Addended by: Montine Circle B on: 12/10/2020 09:55 AM   Modules accepted: Orders

## 2020-12-11 ENCOUNTER — Telehealth: Payer: Self-pay

## 2020-12-11 NOTE — Telephone Encounter (Signed)
Called to discuss with patient about COVID-19 symptoms and the use of one of the available treatments for those with mild to moderate Covid symptoms and at a high risk of hospitalization.  Pt appears to qualify for outpatient treatment due to co-morbid conditions and/or a member of an at-risk group in accordance with the FDA Emergency Use Authorization.    Symptom onset: Unknown Vaccinated: Unknown Booster? Unknown Immunocompromised? No Qualifiers: HTN  Unable to reach pt - Left message with call back number [phone].  Masami Plata A Temiloluwa Recchia   

## 2020-12-12 DIAGNOSIS — J019 Acute sinusitis, unspecified: Secondary | ICD-10-CM | POA: Diagnosis not present

## 2020-12-12 DIAGNOSIS — J40 Bronchitis, not specified as acute or chronic: Secondary | ICD-10-CM | POA: Diagnosis not present

## 2020-12-12 DIAGNOSIS — U071 COVID-19: Secondary | ICD-10-CM | POA: Diagnosis not present

## 2020-12-18 DIAGNOSIS — J40 Bronchitis, not specified as acute or chronic: Secondary | ICD-10-CM | POA: Diagnosis not present

## 2020-12-18 DIAGNOSIS — R509 Fever, unspecified: Secondary | ICD-10-CM | POA: Diagnosis not present

## 2020-12-18 DIAGNOSIS — U071 COVID-19: Secondary | ICD-10-CM | POA: Diagnosis not present

## 2021-05-28 DIAGNOSIS — Z13220 Encounter for screening for lipoid disorders: Secondary | ICD-10-CM | POA: Diagnosis not present

## 2021-05-28 DIAGNOSIS — Z0000 Encounter for general adult medical examination without abnormal findings: Secondary | ICD-10-CM | POA: Diagnosis not present

## 2021-05-28 DIAGNOSIS — Z125 Encounter for screening for malignant neoplasm of prostate: Secondary | ICD-10-CM | POA: Diagnosis not present

## 2021-05-28 DIAGNOSIS — I129 Hypertensive chronic kidney disease with stage 1 through stage 4 chronic kidney disease, or unspecified chronic kidney disease: Secondary | ICD-10-CM | POA: Diagnosis not present

## 2021-06-06 ENCOUNTER — Telehealth: Payer: Self-pay | Admitting: Orthopaedic Surgery

## 2021-06-06 NOTE — Telephone Encounter (Signed)
Pt called and would like to know id he can be worked into [employer] schedule next week?

## 2021-06-07 ENCOUNTER — Ambulatory Visit: Payer: Self-pay

## 2021-06-07 ENCOUNTER — Ambulatory Visit: Payer: BC Managed Care – PPO | Admitting: Physician Assistant

## 2021-06-07 ENCOUNTER — Other Ambulatory Visit: Payer: Self-pay

## 2021-06-07 DIAGNOSIS — M79672 Pain in left foot: Secondary | ICD-10-CM

## 2021-06-07 DIAGNOSIS — M1A00X0 Idiopathic chronic gout, unspecified site, without tophus (tophi): Secondary | ICD-10-CM | POA: Diagnosis not present

## 2021-06-07 MED ORDER — COLCHICINE 0.6 MG PO TABS: 0.6000 mg | ORAL_TABLET | Freq: Two times a day (BID) | ORAL | 2 refills | Status: DC | PRN

## 2021-06-07 NOTE — Progress Notes (Signed)
Office Visit Note   Patient: Kenneth [name]           Date of Birth: 1961-07-07           MRN: [id number] Visit Date: 06/07/2021              Requested by: Tower, [name], MD [place],  [place] [address] PCP: Abner Greenspan, MD  Chief Complaint  Patient presents with   Left Foot - Pain    Pain and redness at base of great toe.      HPI: Patient presents today with a history today and yesterday of left great toe pain.  He denies any injuries.  He denies a history of gout.  It is focused around his first MTP joint and he has noticed progressive redness.  He is not a diabetic no other pain in his foot or ankle  Assessment & Plan: Visit Diagnoses:  1. Pain in left foot     Plan: Findings consistent with gout.  After reviewing the patient's records he had a uric acid in January that was 7.5.  He does not recall that he was placed on any medication or this was brought to his attention.  We will place him on colchicine.  We will call him with the results of his uric acid on Monday.  If his symptoms resolved we could call him in some allopurinol.  Should have another uric acid drawn when he returns  Follow-Up Instructions: No follow-ups on file.   Ortho Exam  Patient is alert, oriented, no adenopathy, well-dressed, normal affect, normal respiratory effort. Examination he has easily palpable pulses.  This he does have some erythema focused around the great toe.  No ascending cellulitis he has tenderness with manipulation of the toe.  Skin is in good condition no tenderness with the ankle or midfoot.  Imaging: No results found. No images are attached to the encounter.  Labs: Lab Results  Component Value Date   ESRSEDRATE 28 (H) 11/27/2020   LABURIC 7.5 11/27/2020   REPTSTATUS 12/13/2007 FINAL 12/11/2007   CULT NO GROWTH 12/11/2007     Lab Results  Component Value Date   ALBUMIN 3.8 03/02/2009   ALBUMIN 3.7 03/17/2008   ALBUMIN 3.5 03/09/2007    No  results found for: MG No results found for: VD25OH  No results found for: PREALBUMIN CBC EXTENDED Latest Ref Rng & Units 03/02/2009 03/17/2008  WBC 4.5 - 10.5 10*3/microliter 5.8 5.7  RBC 4.22 - 5.81 M/uL 5.89(H) 5.02  HGB 13.0 - 17.0 g/dL 16.7 14.4  HCT 39.0 - 52.0 % 48.7 42.1  PLT 150.0 - 400.0 K/uL 202.0 217  NEUTROABS 1.4 - 7.7 K/uL 3.8 3.4  LYMPHSABS 0.7 - 4.0 K/uL 1.4 -     There is no height or weight on file to calculate BMI.  Orders:  Orders Placed This Encounter  Procedures   XR Foot Complete Left   Uric acid   Meds ordered this encounter  Medications   colchicine 0.6 MG tablet    Sig: Take 1 tablet (0.6 mg total) by mouth 2 (two) times daily as needed.    Dispense:  30 tablet    Refill:  2     Procedures: No procedures performed  Clinical Data: No additional findings.  ROS:  All other systems negative, except as noted in the HPI. Review of Systems  Objective: Vital Signs: There were no vitals taken for this visit.  Specialty Comments:  No specialty comments available.  PMFS History: Patient Active Problem List   Diagnosis Date Noted   Bursitis of right hip 08/15/2020   Olecranon bursitis, right elbow 05/22/2020   Elbow mass, left 03/12/2020   Calcific tendinitis of left elbow 05/23/2019   Degenerative joint disease of knee, left 10/27/2018   Calcific Achilles tendinitis 06/20/2015   HYPOGONADISM 04/13/2009   LOW HDL 04/13/2009   ANKLE PAIN, RIGHT 04/13/2009   ERECTILE DYSFUNCTION 03/02/2009   ALLERGIC RHINITIS 03/02/2009   FATIGUE 03/02/2009   ESSENTIAL HYPERTENSION 04/05/2008   PSORIASIS 06/15/2007   TENDINITIS, ELBOW 06/15/2007   CHONDROMALACIA 06/15/2007   LIVER FUNCTION TESTS, ABNORMAL 06/15/2007   No past medical history on file.  No family history on file.  No past surgical history on file. Social History   Occupational History   Not on file  Tobacco Use   Smoking status: Some Days   Smokeless tobacco: Never  Substance and  Sexual Activity   Alcohol use: Not Currently    Alcohol/week: 0.0 standard drinks   Drug use: Not Currently   Sexual activity: Not on file

## 2021-06-08 LAB — URIC ACID: Uric Acid, Serum: 6.7 mg/dL (ref 4.0–8.0)

## 2021-06-10 ENCOUNTER — Telehealth: Payer: Self-pay

## 2021-06-10 ENCOUNTER — Other Ambulatory Visit: Payer: Self-pay

## 2021-06-10 MED ORDER — ALLOPURINOL 100 MG PO TABS: 100.0000 mg | ORAL_TABLET | Freq: Two times a day (BID) | ORAL | 2 refills | Status: DC

## 2021-06-10 NOTE — Telephone Encounter (Signed)
Called patient states he already saw Bevely Palmer on 06/07/2021.

## 2021-06-10 NOTE — Telephone Encounter (Signed)
-----   Message from Newt Minion, MD sent at 06/10/2021 11:12 AM EDT ----- Call patient, uric acid 6.7.  Would continue the colchicine once a day until he is asymptomatic.  Have him take allopurinol 100 mg twice a day and follow-up in 4 weeks with repeat labs. ----- Message ----- From: Cheyenne Adas Lab Results In Sent: 06/08/2021  12:17 AM EDT To: Newt Minion, MD

## 2021-06-10 NOTE — Telephone Encounter (Signed)
Called and lm on vm to advise of lab results and rx faxed to [facility] to call and make an appt for follow up in 4 weeks or with any questions.

## 2021-07-17 ENCOUNTER — Ambulatory Visit: Payer: Self-pay

## 2021-07-17 ENCOUNTER — Encounter: Payer: Self-pay | Admitting: Surgery

## 2021-07-17 ENCOUNTER — Ambulatory Visit: Payer: BC Managed Care – PPO | Admitting: Surgery

## 2021-07-17 VITALS — BP 122/73 | HR 81 | Ht 71.5 in | Wt 257.0 lb

## 2021-07-17 DIAGNOSIS — R2232 Localized swelling, mass and lump, left upper limb: Secondary | ICD-10-CM

## 2021-07-17 DIAGNOSIS — M1A0720 Idiopathic chronic gout, left ankle and foot, without tophus (tophi): Secondary | ICD-10-CM | POA: Diagnosis not present

## 2021-07-17 NOTE — Progress Notes (Signed)
Office Visit Note   Patient: Kenneth [name]           Date of Birth: [birth year]           MRN: [id number] Visit Date: 07/17/2021              Requested by: Tower, [name], MD [place],  [place] [address] PCP: Abner Greenspan, MD   Assessment & Plan: Visit Diagnoses:  1. Elbow mass, left   2. Chronic gout of left foot, unspecified cause     Plan: For his elbow pain patient will follow-up with Dr. Lorin Mercy in a couple weeks for recheck.  Patient seen with Dr. Lorin Mercy today and thinks that it may be a stitch abscess from his previous left olecranon bursectomy that was done last year.  If this continues to be bothersome Dr. Lorin Mercy may consider removing this.  Regards to his left great toe pain likely from gout I did offer first MTP joint injection the patient states that he will wait and see how this does.  Dr. Lorin Mercy can discuss injection with him at follow-up visit if this continues to be a problem.  Patient does have colchicine and allopurinol that was given by Dr. Jess Barters PA June 07, 2021.  Follow-Up Instructions: Return in about 2 weeks (around 07/31/2021) for with dr yates left elbow .   Orders:  Orders Placed This Encounter  Procedures   XR Elbow 2 Views Left   No orders of the defined types were placed in this encounter.     Procedures: No procedures performed   Clinical Data: No additional findings.   Subjective: Chief Complaint  Patient presents with   Left Elbow - Pain    HPI 60 year old white male comes in today with complaints of left posterior elbow pain and left great toe pain.  Patient has a small "knot on the posterior elbow along his previous incision from bursectomy over a year ago.  States that he just noticed this a few weeks ago and has discomfort with direct pressure.  Does not recall having any injury to his elbow recently.  No drainage from the area.  He said it is hard and firm to the touch.  He thought it might have been a wart.   Regards to his left great toe states that he has a known history of gout.  Was recently seen by Dr. Jess Barters PA Haynes Dage June 08, 2019 and was prescribed colchicine and allopurinol.  States that he continues to have pain in the joint with ambulation. Review of Systems No current cardiac pulmonary GI GU  Objective: Vital Signs: BP 122/73   Pulse 81   Ht 5' 11.5" (1.816 m)   Wt 257 lb (116.6 kg)   BMI 35.34 kg/m   Physical Exam HENT:     Head: Normocephalic.  Eyes:     Extraocular Movements: Extraocular movements intact.  Pulmonary:     Effort: No respiratory distress.  Musculoskeletal:     Comments: Left elbow he has good range of motion.  No swelling.  He does have a small 2 to 3 mm wartlike mass on the posterior elbow.  This is tender to palpation.  No signs infection.  Left great toe he does have some swelling and slight redness.  Marked tenderness at the [facility] TP joint.  Neurological:     Mental Status: He is alert.  Psychiatric:        Mood and Affect: Mood normal.  Ortho Exam  Specialty Comments:  No specialty comments available.  Imaging: No results found.   PMFS History: Patient Active Problem List   Diagnosis Date Noted   Bursitis of right hip 08/15/2020   Olecranon bursitis, right elbow 05/22/2020   Elbow mass, left 03/12/2020   Calcific tendinitis of left elbow 05/23/2019   Degenerative joint disease of knee, left 10/27/2018   Calcific Achilles tendinitis 06/20/2015   HYPOGONADISM 04/13/2009   LOW HDL 04/13/2009   ANKLE PAIN, RIGHT 04/13/2009   ERECTILE DYSFUNCTION 03/02/2009   ALLERGIC RHINITIS 03/02/2009   FATIGUE 03/02/2009   ESSENTIAL HYPERTENSION 04/05/2008   PSORIASIS 06/15/2007   TENDINITIS, ELBOW 06/15/2007   CHONDROMALACIA 06/15/2007   LIVER FUNCTION TESTS, ABNORMAL 06/15/2007   History reviewed. No pertinent past medical history.  History reviewed. No pertinent family history.  History reviewed. No pertinent surgical history. Social History    Occupational History   Not on file  Tobacco Use   Smoking status: Some Days   Smokeless tobacco: Never  Substance and Sexual Activity   Alcohol use: Not Currently    Alcohol/week: 0.0 standard drinks   Drug use: Not Currently   Sexual activity: Not on file

## 2021-08-12 DIAGNOSIS — Z1212 Encounter for screening for malignant neoplasm of rectum: Secondary | ICD-10-CM | POA: Diagnosis not present

## 2021-08-12 DIAGNOSIS — Z1211 Encounter for screening for malignant neoplasm of colon: Secondary | ICD-10-CM | POA: Diagnosis not present

## 2021-10-29 ENCOUNTER — Encounter: Payer: Self-pay | Admitting: Orthopaedic Surgery

## 2021-10-29 ENCOUNTER — Ambulatory Visit: Payer: Self-pay

## 2021-10-29 ENCOUNTER — Ambulatory Visit: Payer: BC Managed Care – PPO | Admitting: Orthopaedic Surgery

## 2021-10-29 ENCOUNTER — Other Ambulatory Visit: Payer: Self-pay

## 2021-10-29 VITALS — BP 123/80 | HR 80 | Ht 71.5 in | Wt 233.0 lb

## 2021-10-29 DIAGNOSIS — M25521 Pain in right elbow: Secondary | ICD-10-CM | POA: Diagnosis not present

## 2021-10-29 DIAGNOSIS — M10021 Idiopathic gout, right elbow: Secondary | ICD-10-CM | POA: Diagnosis not present

## 2021-10-29 DIAGNOSIS — M109 Gout, unspecified: Secondary | ICD-10-CM | POA: Insufficient documentation

## 2021-10-29 NOTE — Addendum Note (Signed)
Addended by: Marybelle Killings on: 10/29/2021 02:56 PM   Modules accepted: Orders

## 2021-10-29 NOTE — Progress Notes (Signed)
Office Visit Note   Patient: Kenneth [name]           Date of Birth: [birth year]           MRN: [id number] Visit Date: 10/29/2021              Requested by: Tower, [name], MD [place],  [place] [address] PCP: Abner Greenspan, MD   Assessment & Plan: Visit Diagnoses:  1. Pain in right elbow   2. Idiopathic gout of right elbow, unspecified chronicity     Plan: Patient will take Aleve 2 twice a day for the next 3 weeks and after couple days start allopurinol 100 mg daily and then after 1 week increase to 200 mg for a week and then increase to 300 mg daily.  He will call when he needs new prescription previously had 100 mg tablets but likely needs to be on 300 mg daily.  Previous uric acid was 7.5  , 11 months ago and 6.7 ,    4 months ago.  We discussed pathophysiology of gout for proper treatment.  He should do well once he gets back up on the allopurinol on a daily basis.  He will call if he has increased problems.  He will call if he needs a new prescription for the allopurinol 300 mg 1 p.o. daily with multiple refills.  Follow-Up Instructions: No follow-ups on file.   Orders:  Orders Placed This Encounter  Procedures   XR Elbow 2 Views Right   No orders of the defined types were placed in this encounter.     Procedures: No procedures performed   Clinical Data: No additional findings.   Subjective: Chief Complaint  Patient presents with   Right Elbow - Pain    HPI 60 year old male returns with right elbow pain increased warmth stiffness.  He had been diagnosed with gout and had olecranon bursal excision by me in June 2021.  Previous elbow scope removal of loose fragments in 2008.  Patient was on allopurinol 100 mg twice a day but states a year ago he states he did not have gout anymore so he stopped taking the medication.  He also colchicine.  He noticed some increased warmth to his elbow pain with activity stiffness and states he has to prop his arm  up in order to fall asleep.  He has significant decreased range of motion for 10 days took some ibuprofen regularly this helped to some degree.  Patient states that just before his elbow was bothering him he was also having pain in his big toe with slight redness and stiffness.  Review of Systems all other systems noncontributory to HPI.   Objective: Vital Signs: BP 123/80    Pulse 80    Ht 5' 11.5" (1.816 m)    Wt 233 lb (105.7 kg)    BMI 32.04 kg/m   Physical Exam Constitutional:      Appearance: He is well-developed.  HENT:     Head: Normocephalic and atraumatic.     Right Ear: External ear normal.     Left Ear: External ear normal.  Eyes:     Pupils: Pupils are equal, round, and reactive to light.  Neck:     Thyroid: No thyromegaly.     Trachea: No tracheal deviation.  Cardiovascular:     Rate and Rhythm: Normal rate.  Pulmonary:     Effort: Pulmonary effort is normal.     Breath sounds:  No wheezing.  Abdominal:     General: Bowel sounds are normal.     Palpations: Abdomen is soft.  Musculoskeletal:     Cervical back: Neck supple.  Skin:    General: Skin is warm and dry.     Capillary Refill: Capillary refill takes less than 2 seconds.  Neurological:     Mental Status: He is alert and oriented to person, place, and time.  Psychiatric:        Behavior: Behavior normal.        Thought Content: Thought content normal.        Judgment: Judgment normal.    Ortho Exam slight increased warmth right elbow no olecranon bursitis.  Microline bursal scar is well-healed.  He lacks 30 to 40 degrees reaching elbow extension crepitus with range of motion crepitus with attempted supination pronation.  Opposite elbow has full range of motion on the left.  Specialty Comments:  No specialty comments available.  Imaging: XR Elbow 2 Views Right  Result Date: 10/29/2021 2 view x-rays right elbow demonstrate degenerative changes in the right elbow with some small bone fragments  anterior smaller posteriorly.  Joint space narrowing. Impression: Right elbow degenerative changes negative for acute fracture.    PMFS History: Patient Active Problem List   Diagnosis Date Noted   Gout of right elbow 10/29/2021   Bursitis of right hip 08/15/2020   Olecranon bursitis, right elbow 05/22/2020   Elbow mass, left 03/12/2020   Calcific tendinitis of left elbow 05/23/2019   Degenerative joint disease of knee, left 10/27/2018   Calcific Achilles tendinitis 06/20/2015   HYPOGONADISM 04/13/2009   LOW HDL 04/13/2009   ANKLE PAIN, RIGHT 04/13/2009   ERECTILE DYSFUNCTION 03/02/2009   ALLERGIC RHINITIS 03/02/2009   FATIGUE 03/02/2009   ESSENTIAL HYPERTENSION 04/05/2008   PSORIASIS 06/15/2007   TENDINITIS, ELBOW 06/15/2007   CHONDROMALACIA 06/15/2007   LIVER FUNCTION TESTS, ABNORMAL 06/15/2007   No past medical history on file.  No family history on file.  No past surgical history on file. Social History   Occupational History   Not on file  Tobacco Use   Smoking status: Some Days   Smokeless tobacco: Never  Substance and Sexual Activity   Alcohol use: Not Currently    Alcohol/week: 0.0 standard drinks   Drug use: Not Currently   Sexual activity: Not on file

## 2021-11-04 ENCOUNTER — Telehealth: Payer: BC Managed Care – PPO | Admitting: Physician Assistant

## 2021-11-04 DIAGNOSIS — J40 Bronchitis, not specified as acute or chronic: Secondary | ICD-10-CM | POA: Diagnosis not present

## 2021-11-04 MED ORDER — BENZONATATE 100 MG PO CAPS: 100.0000 mg | ORAL_CAPSULE | Freq: Three times a day (TID) | ORAL | 0 refills | Status: DC | PRN

## 2021-11-04 MED ORDER — PREDNISONE 20 MG PO TABS: 40.0000 mg | ORAL_TABLET | Freq: Every day | ORAL | 0 refills | Status: DC

## 2021-11-04 MED ORDER — ALBUTEROL SULFATE HFA 108 (90 BASE) MCG/ACT IN AERS: 2.0000 | INHALATION_SPRAY | Freq: Four times a day (QID) | RESPIRATORY_TRACT | 0 refills | Status: DC | PRN

## 2021-11-04 NOTE — Progress Notes (Signed)
We are sorry that you are not feeling well.  Here is how we plan to help!  Based on your presentation I believe you most likely have A cough due to a virus.  This is called viral bronchitis and is best treated by rest, plenty of fluids and control of the cough.  You may use Ibuprofen or Tylenol as directed to help your symptoms.     In addition you may use A prescription cough medication called Tessalon Perles 100mg . You may take 1-2 capsules every 8 hours as needed for your cough.  Prednisone 40mg  once daily for 7 days  Albuterol inhaler 1-2 puffs every 6 hours as needed for shortness of breath and wheezing  From your responses in the eVisit questionnaire you describe inflammation in the upper respiratory tract which is causing a significant cough.  This is commonly called Bronchitis and has four common causes:   Allergies Viral Infections Acid Reflux Bacterial Infection Allergies, viruses and acid reflux are treated by controlling symptoms or eliminating the cause. An example might be a cough caused by taking certain blood pressure medications. You stop the cough by changing the medication. Another example might be a cough caused by acid reflux. Controlling the reflux helps control the cough.  USE OF BRONCHODILATOR ("RESCUE") INHALERS: There is a risk from using your bronchodilator too frequently.  The risk is that over-reliance on a medication which only relaxes the muscles surrounding the breathing tubes can reduce the effectiveness of medications prescribed to reduce swelling and congestion of the tubes themselves.  Although you feel brief relief from the bronchodilator inhaler, your asthma may actually be worsening with the tubes becoming more swollen and filled with mucus.  This can delay other crucial treatments, such as oral steroid medications. If you need to use a bronchodilator inhaler daily, several times per day, you should discuss this with your provider.  There are probably better  treatments that could be used to keep your asthma under control.     HOME CARE Only take medications as instructed by your medical team. Complete the entire course of an antibiotic. Drink plenty of fluids and get plenty of rest. Avoid close contacts especially the very young and the elderly Cover your mouth if you cough or cough into your sleeve. Always remember to wash your hands A steam or ultrasonic humidifier can help congestion.   GET HELP RIGHT AWAY IF: You develop worsening fever. You become short of breath You cough up blood. Your symptoms persist after you have completed your treatment plan MAKE SURE YOU  Understand these instructions. Will watch your condition. Will get help right away if you are not doing well or get worse.    Thank you for choosing an e-visit.  Your e-visit answers were reviewed by a board certified advanced clinical practitioner to complete your personal care plan. Depending upon the condition, your plan could have included both over the counter or prescription medications.  Please review your pharmacy choice. Make sure the pharmacy is open so you can pick up prescription now. If there is a problem, you may contact your provider through [employer] and have the prescription routed to another pharmacy.  Your safety is important to [place]. If you have drug allergies check your prescription carefully.   For the next 24 hours you can use MyChart to ask questions about today's visit, request a non-urgent call back, or ask for a work or school excuse. You will get an email in the next two  days asking about your experience. I hope that your e-visit has been valuable and will speed your recovery.  I provided 5 minutes of non face-to-face time during this encounter for chart review and documentation.

## 2021-12-24 ENCOUNTER — Other Ambulatory Visit: Payer: Self-pay | Admitting: Physician Assistant

## 2021-12-24 DIAGNOSIS — R634 Abnormal weight loss: Secondary | ICD-10-CM

## 2021-12-24 DIAGNOSIS — K625 Hemorrhage of anus and rectum: Secondary | ICD-10-CM | POA: Diagnosis not present

## 2021-12-24 DIAGNOSIS — R194 Change in bowel habit: Secondary | ICD-10-CM | POA: Diagnosis not present

## 2021-12-24 DIAGNOSIS — R1030 Lower abdominal pain, unspecified: Secondary | ICD-10-CM | POA: Diagnosis not present

## 2021-12-24 DIAGNOSIS — R195 Other fecal abnormalities: Secondary | ICD-10-CM | POA: Diagnosis not present

## 2021-12-24 DIAGNOSIS — D649 Anemia, unspecified: Secondary | ICD-10-CM | POA: Diagnosis not present

## 2021-12-26 DIAGNOSIS — K5669 Other partial intestinal obstruction: Secondary | ICD-10-CM | POA: Diagnosis not present

## 2021-12-26 DIAGNOSIS — D490 Neoplasm of unspecified behavior of digestive system: Secondary | ICD-10-CM | POA: Diagnosis not present

## 2021-12-26 DIAGNOSIS — R195 Other fecal abnormalities: Secondary | ICD-10-CM | POA: Diagnosis not present

## 2021-12-26 DIAGNOSIS — K648 Other hemorrhoids: Secondary | ICD-10-CM | POA: Diagnosis not present

## 2021-12-26 DIAGNOSIS — C19 Malignant neoplasm of rectosigmoid junction: Secondary | ICD-10-CM | POA: Diagnosis not present

## 2021-12-26 DIAGNOSIS — K625 Hemorrhage of anus and rectum: Secondary | ICD-10-CM | POA: Diagnosis not present

## 2021-12-27 DIAGNOSIS — K6389 Other specified diseases of intestine: Secondary | ICD-10-CM | POA: Diagnosis not present

## 2021-12-31 ENCOUNTER — Other Ambulatory Visit: Payer: Self-pay

## 2021-12-31 ENCOUNTER — Other Ambulatory Visit: Payer: BC Managed Care – PPO

## 2021-12-31 ENCOUNTER — Ambulatory Visit
Admission: RE | Admit: 2021-12-31 | Discharge: 2021-12-31 | Disposition: A | Discharge status: Home / Self Care | Payer: BC Managed Care – PPO | Source: Ambulatory Visit | Attending: Physician Assistant | Admitting: Physician Assistant

## 2021-12-31 DIAGNOSIS — R634 Abnormal weight loss: Secondary | ICD-10-CM

## 2021-12-31 DIAGNOSIS — R1030 Lower abdominal pain, unspecified: Secondary | ICD-10-CM

## 2021-12-31 MED ORDER — IOPAMIDOL (ISOVUE-300) INJECTION 61%
80.0000 mL | Freq: Once | INTRAVENOUS | Status: AC | PRN
  Administered 2021-12-31: 80 mL via INTRAVENOUS

## 2022-01-02 ENCOUNTER — Other Ambulatory Visit (HOSPITAL_COMMUNITY): Payer: Self-pay | Admitting: Gastroenterology

## 2022-01-02 ENCOUNTER — Other Ambulatory Visit: Payer: Self-pay | Admitting: Gastroenterology

## 2022-01-02 DIAGNOSIS — K769 Liver disease, unspecified: Secondary | ICD-10-CM

## 2022-01-02 DIAGNOSIS — R935 Abnormal findings on diagnostic imaging of other abdominal regions, including retroperitoneum: Secondary | ICD-10-CM

## 2022-01-07 DIAGNOSIS — R935 Abnormal findings on diagnostic imaging of other abdominal regions, including retroperitoneum: Secondary | ICD-10-CM | POA: Diagnosis not present

## 2022-01-07 DIAGNOSIS — K3189 Other diseases of stomach and duodenum: Secondary | ICD-10-CM | POA: Diagnosis not present

## 2022-01-07 DIAGNOSIS — K5900 Constipation, unspecified: Secondary | ICD-10-CM | POA: Diagnosis not present

## 2022-01-07 DIAGNOSIS — N281 Cyst of kidney, acquired: Secondary | ICD-10-CM | POA: Diagnosis not present

## 2022-01-07 DIAGNOSIS — K769 Liver disease, unspecified: Secondary | ICD-10-CM | POA: Diagnosis not present

## 2022-01-07 DIAGNOSIS — K7689 Other specified diseases of liver: Secondary | ICD-10-CM | POA: Diagnosis not present

## 2022-01-09 DIAGNOSIS — C19 Malignant neoplasm of rectosigmoid junction: Secondary | ICD-10-CM | POA: Diagnosis not present

## 2022-01-09 DIAGNOSIS — C20 Malignant neoplasm of rectum: Secondary | ICD-10-CM | POA: Diagnosis not present

## 2022-01-09 HISTORY — DX: Malignant neoplasm of rectum: C20

## 2022-01-10 ENCOUNTER — Ambulatory Visit (HOSPITAL_COMMUNITY): Payer: BC Managed Care – PPO

## 2022-01-10 ENCOUNTER — Encounter (HOSPITAL_COMMUNITY): Payer: Self-pay

## 2022-01-10 ENCOUNTER — Encounter: Payer: Self-pay | Admitting: *Deleted

## 2022-01-10 NOTE — Progress Notes (Signed)
PATIENT NAVIGATOR PROGRESS NOTE  Name: Kenneth [name] Date: 01/10/2022 MRN: [id number]  DOB: [birth year]   Reason for visit:  Introductory phone call  Comments:  Called patient and made appt for New Patient appt for 02/06/22 which will be 3 weeks after surgery.   He is scheduled for surgery at [facility] on 01/15/22 with Dr Morton Stall    Time spent counseling/coordinating care: 15-30 minutes

## 2022-01-13 DIAGNOSIS — E663 Overweight: Secondary | ICD-10-CM | POA: Diagnosis not present

## 2022-01-13 DIAGNOSIS — K7689 Other specified diseases of liver: Secondary | ICD-10-CM | POA: Diagnosis not present

## 2022-01-13 DIAGNOSIS — L0592 Pilonidal sinus without abscess: Secondary | ICD-10-CM | POA: Diagnosis not present

## 2022-01-13 DIAGNOSIS — Z6829 Body mass index (BMI) 29.0-29.9, adult: Secondary | ICD-10-CM | POA: Diagnosis not present

## 2022-01-13 DIAGNOSIS — L0591 Pilonidal cyst without abscess: Secondary | ICD-10-CM | POA: Diagnosis not present

## 2022-01-13 DIAGNOSIS — D509 Iron deficiency anemia, unspecified: Secondary | ICD-10-CM | POA: Diagnosis not present

## 2022-01-13 DIAGNOSIS — C20 Malignant neoplasm of rectum: Secondary | ICD-10-CM | POA: Diagnosis not present

## 2022-01-13 DIAGNOSIS — K219 Gastro-esophageal reflux disease without esophagitis: Secondary | ICD-10-CM | POA: Diagnosis not present

## 2022-01-13 DIAGNOSIS — K660 Peritoneal adhesions (postprocedural) (postinfection): Secondary | ICD-10-CM | POA: Diagnosis not present

## 2022-01-13 DIAGNOSIS — N183 Chronic kidney disease, stage 3 unspecified: Secondary | ICD-10-CM | POA: Diagnosis not present

## 2022-01-13 DIAGNOSIS — G4700 Insomnia, unspecified: Secondary | ICD-10-CM | POA: Diagnosis not present

## 2022-01-13 DIAGNOSIS — J309 Allergic rhinitis, unspecified: Secondary | ICD-10-CM | POA: Diagnosis not present

## 2022-01-13 DIAGNOSIS — F17290 Nicotine dependence, other tobacco product, uncomplicated: Secondary | ICD-10-CM | POA: Diagnosis not present

## 2022-01-13 DIAGNOSIS — I129 Hypertensive chronic kidney disease with stage 1 through stage 4 chronic kidney disease, or unspecified chronic kidney disease: Secondary | ICD-10-CM | POA: Diagnosis not present

## 2022-01-13 DIAGNOSIS — M109 Gout, unspecified: Secondary | ICD-10-CM | POA: Diagnosis not present

## 2022-01-13 DIAGNOSIS — E43 Unspecified severe protein-calorie malnutrition: Secondary | ICD-10-CM | POA: Diagnosis not present

## 2022-01-13 DIAGNOSIS — R110 Nausea: Secondary | ICD-10-CM | POA: Diagnosis not present

## 2022-01-14 ENCOUNTER — Other Ambulatory Visit: Payer: BC Managed Care – PPO

## 2022-01-15 ENCOUNTER — Other Ambulatory Visit (HOSPITAL_COMMUNITY): Payer: Self-pay | Admitting: Surgical Oncology

## 2022-01-15 ENCOUNTER — Other Ambulatory Visit: Payer: Self-pay | Admitting: Surgical Oncology

## 2022-01-15 DIAGNOSIS — C20 Malignant neoplasm of rectum: Secondary | ICD-10-CM

## 2022-01-15 HISTORY — PX: ILEOSTOMY: SHX1783

## 2022-01-22 DIAGNOSIS — C20 Malignant neoplasm of rectum: Secondary | ICD-10-CM | POA: Diagnosis not present

## 2022-01-23 ENCOUNTER — Encounter (HOSPITAL_COMMUNITY): Payer: Self-pay

## 2022-01-23 ENCOUNTER — Ambulatory Visit (HOSPITAL_COMMUNITY): Payer: BC Managed Care – PPO

## 2022-01-30 ENCOUNTER — Telehealth: Payer: Self-pay | Admitting: *Deleted

## 2022-02-03 ENCOUNTER — Encounter: Payer: Self-pay | Admitting: *Deleted

## 2022-02-06 ENCOUNTER — Other Ambulatory Visit: Payer: Self-pay

## 2022-02-06 ENCOUNTER — Encounter: Payer: Self-pay | Admitting: Oncology

## 2022-02-06 ENCOUNTER — Inpatient Hospital Stay: Payer: BC Managed Care – PPO | Attending: Oncology | Admitting: Oncology

## 2022-02-06 ENCOUNTER — Other Ambulatory Visit: Payer: Self-pay | Admitting: *Deleted

## 2022-02-06 ENCOUNTER — Encounter: Payer: Self-pay | Admitting: *Deleted

## 2022-02-06 ENCOUNTER — Telehealth: Payer: Self-pay | Admitting: Radiation Oncology

## 2022-02-06 VITALS — BP 132/86 | HR 76 | Temp 97.9°F | Resp 20 | Ht 71.0 in | Wt 205.6 lb

## 2022-02-06 DIAGNOSIS — I10 Essential (primary) hypertension: Secondary | ICD-10-CM

## 2022-02-06 DIAGNOSIS — C20 Malignant neoplasm of rectum: Secondary | ICD-10-CM

## 2022-02-06 DIAGNOSIS — L409 Psoriasis, unspecified: Secondary | ICD-10-CM

## 2022-02-06 DIAGNOSIS — R599 Enlarged lymph nodes, unspecified: Secondary | ICD-10-CM

## 2022-02-06 DIAGNOSIS — M109 Gout, unspecified: Secondary | ICD-10-CM

## 2022-02-06 NOTE — Progress Notes (Addendum)
?Kenneth [name] ?New Patient Consult ? ? ?Requesting MD: ?Cheryll Cockayne, Md ?[facility] [address] ?[place],  [place] [id number] ? ? ?Kenneth [name] ?61 y.o.  ?[birth year] ? ?  ?Reason for Consult: Rectal cancer ? ? ?HPI: Kenneth [name] reports having a positive Cologuard test in September 2022.  He developed progressive lower abdominal pain surrounding bowel movements.  He was referred to Dr. Myrna Blazer and was taken to a colonoscopy 12/26/2021.  A partially obstructing mass was found in the rectosigmoid at 15 cm.  The mass was circumferential.  Biopsies were obtained.  The scope could not be advanced beyond the mass.  The distal area was tattooed. ? ?CTs of the chest, abdomen, and pelvis on 12/31/2021 revealed masslike circumferential wall thickening at the distal sigmoid colon suspicious for malignancy.  Mildly enlarged pericolonic lymph nodes.  Subcentimeter hypodensities throughout the liver are too small to characterize and likely represent small cysts or hemangiomas.  Splenomegaly. ? ?An MRI of the abdomen 01/07/2022 at [facility] revealed innumerable T2 hyperintense nonenhancing lesions in the liver consistent with cysts.  No enhancing liver lesions suspicious for malignancy.  No ascites or adenopathy.  Diffuse gastric wall thickening.  No bowel obstruction. ? ?He was referred to Dr. Morton Stall on 01/09/2022.  Proctoscopy revealed a mass beginning at 11.5 cm. ? ?A rectal MRI on 01/22/2022 confirmed a tumor beginning at 11.3 cm from the anal verge and 6.1 cm from the sphincter complex.  Tumor straddled the peritoneal reflection.  The tumor was characterized as a T3c lesion.  A lymph node versus tumor deposit was noted at the right aspect of the tumor measuring 1.1 cm.  Multiple enlarged perirectal lymph nodes. ? ? ?He reports progressive abdominal pain for several months with intermittent rectal bleeding and difficulty having bowel movements.  He was taken to a loop ileostomy procedure  01/15/2022. ? ?Kenneth [name] empties the ileostomy bag 4-5 times per day.  He is drinking increased amounts of fluid.  The stool output is sometimes formed. ? ? ?Past medical history: ?1.  Hypertension ?2.  Psoriasis ?3.  Gout ?4.  COVID-05 December 2018 ? ?Past surgical history: ?Left olecranon cyst ?Right elbow arthroscopy ?Small bowel intussusception age 21 ? ?Medications: Reviewed ? ?Allergies: No Known Allergies ? ?Family history: His father died of pancreas cancer at age 2.  His mother had breast cancer and died of leukemia at age 69.  His brother has polyps. ? ?Social History:  ? ?He lives with his wife in [place].  He works in the "event" business.  He has 2 sons.  He smokes 4 to 5 cigars/day.  No alcohol use.  No transfusion history.  No risk factor for HIV or hepatitis.  He has not received COVID-19 or influenza vaccines. ? ?ROS:  ? ?Positives include: Abdominal pain and constipation resolved following creation of the ileostomy, intermittent rectal bleeding, psoriasis over the arms and face ? ?A complete ROS was otherwise negative. ? ?Physical Exam: ? ?Blood pressure 132/86, pulse 76, temperature 97.9 ?F (36.6 ?C), temperature source Oral, resp. rate 20, height '5\' 11"'  (1.803 m), weight 205 lb 9.6 oz (93.3 kg), SpO2 100 %. ? ?HEENT: Oropharynx without visible mass, neck without mass ?Lungs: Clear bilaterally ?Cardiac: Regular rate and rhythm ?Abdomen: Right abdomen ileostomy with brown stool, no hepatosplenomegaly, no mass, nontender ?GU: Testes without mass ?Vascular: No leg edema ?Lymph nodes: No cervical, supraclavicular, axillary, or inguinal nodes.  2 cm soft mobile left scalene node  versus subcutaneous cyst ?Neurologic: Alert and oriented, the motor exam appears intact in the upper and lower extremities bilaterally ?Musculoskeletal: No spine tenderness ? ? ?LAB: ? ?CBC ? ?Lab Results  ?Component Value Date  ? WBC 5.8 03/02/2009  ? HGB 16.7 03/02/2009  ? HCT 48.7 03/02/2009  ? MCV 82.7 03/02/2009  ?  PLT 202.0 03/02/2009  ? NEUTROABS 3.8 03/02/2009  ?  ? ?  ? ?CMP  ?Lab Results  ?Component Value Date  ? NA 141 03/02/2009  ? K 4.3 03/02/2009  ? CL 105 03/02/2009  ? CO2 31 03/02/2009  ? GLUCOSE 85 03/02/2009  ? BUN 16 03/02/2009  ? CREATININE 1.4 03/02/2009  ? CALCIUM 8.9 03/02/2009  ? PROT 6.7 03/02/2009  ? ALBUMIN 3.8 03/02/2009  ? AST 43 (H) 03/02/2009  ? ALT 44 03/02/2009  ? ALKPHOS 87 03/02/2009  ? BILITOT 1.1 03/02/2009  ? GFRNONAA 57.59 03/02/2009  ? GFRAA 84 03/17/2008  ? ?CEA 5.2 from [facility] ?Imaging: ?As per HPI CT images from 12/31/2021 reviewed ? ? ? ?Assessment/Plan:  ? ?Rectal cancer-mass at 11 cm on proctoscopy by Dr. Morton Stall 02/04/2022 ?Colonoscopy 12/26/2021-obstructing mass at the rectosigmoid measured at 15 cm, biopsy invasive moderate to poorly differentiated adenocarcinoma, mismatch repair protein expression intact ?CTs 12/31/2021-masslike circumferential thickening of the distal sigmoid colon with mildly enlarged pericolonic lymph nodes, numerous subcentimeter hypodense liver lesions, splenomegaly ?MRI abdomen 01/07/2022-innumerable T2 hyperintense liver lesions consistent with cysts, spleen unremarkable, no ascites or adenopathy, moderate colonic fecal retention, no bowel obstruction ?MRI pelvis 01/22/2022-T3cN2 tumor above and below the peritoneal reflection, tumor 11.3 cm from the anal verge and 6.1 cm from the sphincter complex, multiple enlarged perirectal nodes, 1.1 cm node versus tumor deposit at the right aspect of the tumor ?Diverting loop ileostomy 01/15/2022 ?Gout ?Psoriasis ?Hypertension ?Left scalene node versus cyst/lipoma on exam 02/03/2022 ? ? ?Disposition:  ? ?Kenneth [name] has been diagnosed with a tumor in the high rectum.  The tumor appears to be best characterized as a high rectal cancer as opposed to a sigmoid colon tumor.  He appears to have locally advanced disease based on the staging evaluation to date.  We will refer him for a neck CT to evaluate the palpable abnormality  on exam today. ? ?If the neck imaging is negative the plan is to proceed with total neoadjuvant therapy.  He will be referred to Dr. Lisbeth Renshaw.  I will coordinate sequencing of the neoadjuvant chemotherapy and radiation with Dr. Lisbeth Renshaw. ? ?Mr. Sanzo will be referred for Port-A-Cath placement.  The plan is to begin neoadjuvant FOLFOX on 02/17/2022.  We reviewed potential toxicities associated with the FOLFOX regimen including the chance of nausea/vomiting, mucositis, diarrhea, and hematologic toxicity.  We discussed the rash, sun sensitivity, hyperpigmentation, and hand/foot syndrome seen with 5-fluorouracil.  We discussed the allergic reaction and various types of neuropathy associated with oxaliplatin.  He agrees to proceed.  He will attend a chemotherapy teaching class. ? ?A chemotherapy plan was entered today.  We will follow-up on the pathology report from [facility] and we will request IHC testing for mismatch repair proteins. ? ?Mr. Denz will be seen for an office visit prior to beginning chemotherapy on 02/17/2022. ? ?Betsy Coder, MD  ?02/06/2022, 1:54 PM ? ? ?

## 2022-02-06 NOTE — Progress Notes (Signed)
PATIENT NAVIGATOR PROGRESS NOTE ? ?Name: Kenneth [name] ?Date: 02/06/2022 ?MRN: [id number]  ?DOB: Feb 19, 1961 ? ? ?Reason for visit:  ?Initial visit with Dr [name] ? ?Comments:  Met with Mr and Mrs Methot during their initial visit with Dr [name]. ?Port education done and port to be placed on 3/29 at 0900 at [facility], Instructions given ?Pt to have FOLFOX education on 3/27 at 1200 ?CT soft tissue neck without contrast on 3/27 at 3pm ?Referrals placed for Radiation/Oncology, ?Referral for Social work and Manufacturing systems engineer and Nutrition ?FOLFOX to begin on 4/3 ?Pt and wife given written information and contact numbers.   ? ?  ? ? ? ?Time spent counseling/coordinating care: > 60 minutes ? ?

## 2022-02-06 NOTE — Progress Notes (Signed)
Referral placed for Radiation Oncology consult for Rectal Cancer ?

## 2022-02-06 NOTE — Telephone Encounter (Addendum)
Called patient to schedule a consultation w. Dr. Lisbeth Renshaw. No answer, VM full, unable to LVM.  ?

## 2022-02-06 NOTE — Progress Notes (Signed)
START ON PATHWAY REGIMEN - Colorectal ? ? ?  A cycle is every 14 days: ?    Oxaliplatin  ?    Leucovorin  ?    Fluorouracil  ?    Fluorouracil  ? ?**Always confirm dose/schedule in your pharmacy ordering system** ? ?Patient Characteristics: ?Preoperative or Nonsurgical Candidate (Clinical Staging), Rectal, cT3 - cT4, cN0 or Any cT, cN+ ?Tumor Location: Rectal ?Therapeutic Status: Preoperative or Nonsurgical Candidate (Clinical Staging) ?AJCC T Category: cT3 ?AJCC N Category: cN2 ?AJCC M Category: cM0 ?AJCC 8 Stage Grouping: Unknown ?Intent of Therapy: ?Curative Intent, Discussed with Patient ?

## 2022-02-06 NOTE — Progress Notes (Signed)
Orders entered for [facility] placement ? ?

## 2022-02-07 ENCOUNTER — Telehealth: Payer: Self-pay | Admitting: Oncology

## 2022-02-07 ENCOUNTER — Telehealth: Payer: Self-pay | Admitting: Radiation Oncology

## 2022-02-07 NOTE — Telephone Encounter (Signed)
Called patient to schedule a consultation w. Dr. Lisbeth Renshaw. No answer, unable to LVM, VM is full.  ?

## 2022-02-07 NOTE — Telephone Encounter (Signed)
Attempted to contact patient to schedule Nutrition referral. No answer and voicemail was full so unable to leave voicemail ?

## 2022-02-10 ENCOUNTER — Ambulatory Visit (HOSPITAL_BASED_OUTPATIENT_CLINIC_OR_DEPARTMENT_OTHER)
Admission: RE | Admit: 2022-02-10 | Discharge: 2022-02-10 | Disposition: A | Discharge status: Home / Self Care | Payer: BC Managed Care – PPO | Source: Ambulatory Visit | Attending: Oncology | Admitting: Oncology

## 2022-02-10 ENCOUNTER — Other Ambulatory Visit: Payer: Self-pay

## 2022-02-10 ENCOUNTER — Inpatient Hospital Stay: Payer: BC Managed Care – PPO

## 2022-02-10 ENCOUNTER — Other Ambulatory Visit: Payer: Self-pay | Admitting: *Deleted

## 2022-02-10 ENCOUNTER — Telehealth: Payer: Self-pay | Admitting: Radiation Oncology

## 2022-02-10 ENCOUNTER — Encounter: Payer: Self-pay | Admitting: *Deleted

## 2022-02-10 DIAGNOSIS — M109 Gout, unspecified: Secondary | ICD-10-CM | POA: Insufficient documentation

## 2022-02-10 DIAGNOSIS — I10 Essential (primary) hypertension: Secondary | ICD-10-CM | POA: Diagnosis not present

## 2022-02-10 DIAGNOSIS — C20 Malignant neoplasm of rectum: Secondary | ICD-10-CM | POA: Insufficient documentation

## 2022-02-10 DIAGNOSIS — Z806 Family history of leukemia: Secondary | ICD-10-CM | POA: Diagnosis not present

## 2022-02-10 DIAGNOSIS — F17290 Nicotine dependence, other tobacco product, uncomplicated: Secondary | ICD-10-CM | POA: Insufficient documentation

## 2022-02-10 DIAGNOSIS — J3489 Other specified disorders of nose and nasal sinuses: Secondary | ICD-10-CM | POA: Diagnosis not present

## 2022-02-10 DIAGNOSIS — L409 Psoriasis, unspecified: Secondary | ICD-10-CM | POA: Insufficient documentation

## 2022-02-10 DIAGNOSIS — Z800 Family history of malignant neoplasm of digestive organs: Secondary | ICD-10-CM | POA: Diagnosis not present

## 2022-02-10 LAB — CMP (CANCER CENTER ONLY)
ALT: 15 U/L (ref 0–44)
AST: 18 U/L (ref 15–41)
Albumin: 4.3 g/dL (ref 3.5–5.0)
Alkaline Phosphatase: 77 U/L (ref 38–126)
Anion gap: 7 (ref 5–15)
BUN: 17 mg/dL (ref 6–20)
CO2: 30 mmol/L (ref 22–32)
Calcium: 10.1 mg/dL (ref 8.9–10.3)
Chloride: 101 mmol/L (ref 98–111)
Creatinine: 1.73 mg/dL — ABNORMAL HIGH (ref 0.61–1.24)
GFR, Estimated: 45 mL/min — ABNORMAL LOW (ref >60)
Glucose, Bld: 89 mg/dL (ref 70–99)
Potassium: 4.2 mmol/L (ref 3.5–5.1)
Sodium: 138 mmol/L (ref 135–145)
Total Bilirubin: 0.3 mg/dL (ref 0.3–1.2)
Total Protein: 7 g/dL (ref 6.5–8.1)

## 2022-02-10 LAB — CBC WITH DIFFERENTIAL (CANCER CENTER ONLY)
Abs Immature Granulocytes: 0.01 10*3/uL (ref 0.00–0.07)
Basophils Absolute: 0.1 10*3/uL (ref 0.0–0.1)
Basophils Relative: 1 %
Eosinophils Absolute: 0.5 10*3/uL (ref 0.0–0.5)
Eosinophils Relative: 8 %
HCT: 42.8 % (ref 39.0–52.0)
Hemoglobin: 13.4 g/dL (ref 13.0–17.0)
Immature Granulocytes: 0 %
Lymphocytes Relative: 27 %
Lymphs Abs: 1.7 10*3/uL (ref 0.7–4.0)
MCH: 24 pg — ABNORMAL LOW (ref 26.0–34.0)
MCHC: 31.3 g/dL (ref 30.0–36.0)
MCV: 76.6 fL — ABNORMAL LOW (ref 80.0–100.0)
Monocytes Absolute: 0.4 10*3/uL (ref 0.1–1.0)
Monocytes Relative: 7 %
Neutro Abs: 3.5 10*3/uL (ref 1.7–7.7)
Neutrophils Relative %: 57 %
Platelet Count: 271 10*3/uL (ref 150–400)
RBC: 5.59 MIL/uL (ref 4.22–5.81)
RDW: 22 % — ABNORMAL HIGH (ref 11.5–15.5)
WBC Count: 6.2 10*3/uL (ref 4.0–10.5)
nRBC: 0 % (ref 0.0–0.2)

## 2022-02-10 LAB — CEA (ACCESS): CEA (CHCC): 5.43 ng/mL — ABNORMAL HIGH (ref 0.00–5.00)

## 2022-02-10 MED ORDER — LIDOCAINE-PRILOCAINE 2.5-2.5 % EX CREA: 1.0000 "application " | TOPICAL_CREAM | CUTANEOUS | 0 refills | Status: DC | PRN

## 2022-02-10 MED ORDER — PROCHLORPERAZINE MALEATE 10 MG PO TABS: 10.0000 mg | ORAL_TABLET | Freq: Four times a day (QID) | ORAL | 0 refills | Status: DC | PRN

## 2022-02-10 MED ORDER — ONDANSETRON HCL 8 MG PO TABS: 8.0000 mg | ORAL_TABLET | Freq: Three times a day (TID) | ORAL | 0 refills | Status: DC | PRN

## 2022-02-10 NOTE — Progress Notes (Signed)
PATIENT NAVIGATOR PROGRESS NOTE ? ?Name: Kenneth [name] ?Date: 02/10/2022 ?MRN: [id number]  ?DOB: 07-Apr-1961 ? ? ?Reason for visit:  ?Patient eduation ? ?Comments:  Patient and wife [name] attended patient ed session and we discussed ostomy care as well.  ?Patient enrolled in [place] to help in finding supplier for home supplies. We discussed management of skin breakdown and the possibility of switching to appliance with convexity for a better fit. Discussed and demonstrated appliance of stoma powder after skin care to aid in healing of skin breakdown.   ? ?Will also make referral to Ostomy clinic for more patient assistance.  ? ? ? ?Time spent counseling/coordinating care: > 60 minutes ? ?

## 2022-02-10 NOTE — Progress Notes (Signed)
?Radiation Oncology         [phone]) [phone] ?________________________________ ? ?Name: Kenneth [name]        MRN: [id number]  ?Date of Service: 02/13/2022 DOB: 03/28/1961 ? ?[id number], Jamie Brookes, MD    ? ?REFERRING PHYSICIAN: Ladell Pier, MD ? ? ?DIAGNOSIS: The encounter diagnosis was Rectal cancer ([place]). ? ? ?HISTORY OF PRESENT ILLNESS: Kenneth [name] is a 62 y.o. male seen at the request of Dr. [name] for new diagnosis of rectal cancer.  The patient presented after a positive Cologuard test in September 2022.  He underwent colonoscopy on 12/26/2021 showing a partially obstructing mass in the rectosigmoid colon at 15 cm.  The mass was circumferential and biopsies were consistent with adenocarcinoma the scope could not be advanced beyond the mass.  CT showed masslike circumferential wall thickening of the distal sigmoid colon suspicious for malignancy with mildly enlarged pericolonic lymph nodes.  There were also some centimeter hypodense lesion in the liver and difficult to characterize.  An MRI on 01/07/2022 at [facility] showed innumerable nonenhancing lesions consistent with cysts gastric wall thickening and no evidence of bowel obstruction.  He was seen by Dr. Morton Stall on 01/09/2022 and proctoscopy identified a visible mass at 11 1/2 cm.  He underwent MRI of the rectum on 01/22/2022 showing the location of his cancer 11.3 cm from the anal verge and 6.1 cm from the sphincter complex the tumor straddles the peritoneal reflection and was characterized as T3cN2.  He did undergo loop ileostomy on 01/15/2022 at [facility].  He will proceed with total neoadjuvant chemotherapy followed by chemoradiation and Sherrill plans to administer 8 cycles of FOLFOX next week.  He did have a possible palpable left supraclavicular lymph node on exam and a CT of the neck showed a 9.5 mm node in the left supraclavicular region posterior to the sternocleidomastoid. A PAC  was placed and biopsy performed of the node yesterday. Pathology is pending. He is seen today to discuss chemoradiation at the appropriate time.  ? ? ? ?PREVIOUS RADIATION THERAPY: No ? ? ?PAST MEDICAL HISTORY:  ?Past Medical History:  ?Diagnosis Date  ? Rectal cancer ([place]) 01/09/2022  ?   ? ? ?PAST SURGICAL HISTORY: ?Past Surgical History:  ?Procedure Laterality Date  ? ILEOSTOMY  01/15/2022  ? IR IMAGING GUIDED PORT INSERTION  02/12/2022  ? IR US GUIDE BX ASP/DRAIN  02/12/2022  ? ? ? ?FAMILY HISTORY:  ?Family History  ?Problem Relation Age of Onset  ? Breast cancer Mother   ? Leukemia Mother   ? Pancreatic cancer Father   ? ? ? ?SOCIAL HISTORY:  reports that he has been smoking. He has never used smokeless tobacco. He reports that he does not currently use alcohol. He reports that he does not currently use drugs.  The patient is married and lives in [place].  He works for [employer]. ? ? ?ALLERGIES: Patient has no known allergies. ? ? ?MEDICATIONS:  ?Current Outpatient Medications  ?Medication Sig Dispense Refill  ? albuterol (VENTOLIN HFA) 108 (90 Base) MCG/ACT inhaler Inhale 2 puffs into the lungs every 6 (six) hours as needed for wheezing or shortness of breath. (Patient not taking: Reported on 02/06/2022) 18 g 0  ? allopurinol (ZYLOPRIM) 100 MG tablet Take 1 tablet (100 mg total) by mouth 2 (two) times daily. (Patient not taking: Reported on 02/13/2022) 60 tablet 2  ? amLODipine-benazepril (LOTREL) 5-20 MG per capsule  (Patient  not taking: Reported on 02/06/2022)  4  ? benzonatate (TESSALON) 100 MG capsule Take 1 capsule (100 mg total) by mouth 3 (three) times daily as needed. (Patient not taking: Reported on 02/06/2022) 30 capsule 0  ? Cholecalciferol (VITAMIN D3) 125 MCG (5000 UT) CAPS Take by mouth. 2 daily (Patient not taking: Reported on 02/13/2022)    ? colchicine 0.6 MG tablet Take 1 tablet (0.6 mg total) by mouth 2 (two) times daily as needed. (Patient not taking: Reported on 02/13/2022) 30  tablet 2  ? Cyanocobalamin (VITAMIN B 12) 500 MCG TABS Take by mouth. 2 daily (Patient not taking: Reported on 02/13/2022)    ? ferrous sulfate 325 (65 FE) MG tablet Take 325 mg by mouth daily with breakfast. (Patient not taking: Reported on 02/13/2022)    ? lidocaine-prilocaine (EMLA) cream Apply 1 application. topically as needed (Start using with 2nd treatment, apply to [facility] site 1-2 hours before treatment). (Patient not taking: Reported on 02/13/2022) 30 g 0  ? melatonin 5 MG TABS Take 5 mg by mouth. (Patient not taking: Reported on 02/13/2022)    ? omeprazole (PRILOSEC) 40 MG capsule Take 40 mg by mouth daily. (Patient not taking: Reported on 02/13/2022)    ? ondansetron (ZOFRAN) 8 MG tablet Take 1 tablet (8 mg total) by mouth every 8 (eight) hours as needed for nausea or vomiting (As needed for nausea/vomiting STARTING day 4 after chemo). (Patient not taking: Reported on 02/13/2022) 20 tablet 0  ? predniSONE (DELTASONE) 20 MG tablet Take 2 tablets (40 mg total) by mouth daily with breakfast. (Patient not taking: Reported on 02/06/2022) 14 tablet 0  ? prochlorperazine (COMPAZINE) 10 MG tablet Take 1 tablet (10 mg total) by mouth every 6 (six) hours as needed for nausea or vomiting. (Patient not taking: Reported on 02/13/2022) 30 tablet 0  ? Turmeric 500 MG CAPS Take by mouth. (Patient not taking: Reported on 02/13/2022)    ? vitamin C (ASCORBIC ACID) 500 MG tablet Take 500 mg by mouth daily. (Patient not taking: Reported on 02/13/2022)    ? Vitamin D, Ergocalciferol, (DRISDOL) 1.25 MG (50000 UT) CAPS capsule Take 1 capsule (50,000 Units total) by mouth every 7 (seven) days. (Patient not taking: Reported on 02/13/2022) 12 capsule 0  ? ?No current facility-administered medications for this encounter.  ? ? ? ?REVIEW OF SYSTEMS: On review of systems, the patient reports that he is doing okay since his ileostomy surgery. He is having some fecal urgency and mucous production. He has occasional blood per rectum, but not regular  episodes of this happening. He reports he is more comfortable managing his ileostomy output and working through his eating schedule to maintain his hydration.  ? ?  ? ?PHYSICAL EXAM:  ?Wt Readings from Last 3 Encounters:  ?02/13/22 204 lb 6 oz (92.7 kg)  ?02/12/22 205 lb (93 kg)  ?02/06/22 205 lb 9.6 oz (93.3 kg)  ? ?Temp Readings from Last 3 Encounters:  ?02/13/22 (!) 97 ?F (36.1 ?C) (Temporal)  ?02/12/22 97.9 ?F (36.6 ?C) (Oral)  ?02/06/22 97.9 ?F (36.6 ?C) (Oral)  ? ?BP Readings from Last 3 Encounters:  ?02/13/22 125/84  ?02/12/22 119/75  ?02/06/22 132/86  ? ?Pulse Readings from Last 3 Encounters:  ?02/13/22 75  ?02/12/22 79  ?02/06/22 76  ? ?Pain Assessment ?Pain Score: 2  ?Pain Loc: Chest (Recent Port insertion.)/10 ? ?In general this is a well appearing Caucasian male in no acute distress. He's alert and oriented x4 and appropriate throughout the examination.  Cardiopulmonary assessment is negative for acute distress and he exhibits normal effort.  ? ? ? ?ECOG = 1 ? ?0 - Asymptomatic (Fully active, able to carry on all predisease activities without restriction) ? ?1 - Symptomatic but completely ambulatory (Restricted in physically strenuous activity but ambulatory and able to carry out work of a light or sedentary nature. For example, light housework, office work) ? ?2 - Symptomatic, <50% in bed during the day (Ambulatory and capable of all self care but unable to carry out any work activities. Up and about more than 50% of waking hours) ? ?3 - Symptomatic, >50% in bed, but not bedbound (Capable of only limited self-care, confined to bed or chair 50% or more of waking hours) ? ?4 - Bedbound (Completely disabled. Cannot carry on any self-care. Totally confined to bed or chair) ? ?5 - Death ? ? Oken MM, Creech RH, Tormey DC, et al. [phone]). "Toxicity and response criteria of the [facility] Group". [place] Oncol. 5 (6): 649-55 ? ? ? ?LABORATORY DATA:  ?Lab Results  ?Component Value Date  ?  WBC 6.2 02/10/2022  ? HGB 13.4 02/10/2022  ? HCT 42.8 02/10/2022  ? MCV 76.6 (L) 02/10/2022  ? PLT 271 02/10/2022  ? ?Lab Results  ?Component Value Date  ? NA 138 02/10/2022  ? K 4.2 02/10/2022  ? CL 101

## 2022-02-10 NOTE — Telephone Encounter (Signed)
Called spouse to schedule patient for a consultation w. Dr. Lisbeth Renshaw. No answer, LVM for a return call.  ?

## 2022-02-11 ENCOUNTER — Telehealth: Payer: Self-pay | Admitting: Oncology

## 2022-02-11 ENCOUNTER — Telehealth: Payer: Self-pay | Admitting: Medical Oncology

## 2022-02-11 ENCOUNTER — Other Ambulatory Visit: Payer: Self-pay | Admitting: Radiology

## 2022-02-11 ENCOUNTER — Encounter (HOSPITAL_COMMUNITY): Payer: Self-pay | Admitting: Emergency Medicine

## 2022-02-11 ENCOUNTER — Other Ambulatory Visit: Payer: Self-pay

## 2022-02-11 DIAGNOSIS — C20 Malignant neoplasm of rectum: Secondary | ICD-10-CM

## 2022-02-11 NOTE — Telephone Encounter (Signed)
Attempted to contact patient to schedule Nutrition referral, no answer so voicemail was left for patient to call back. ?

## 2022-02-11 NOTE — Progress Notes (Signed)
PA approved for emla cream through [facility] from 02/11/2022-05/11/2022.  ?

## 2022-02-11 NOTE — Telephone Encounter (Signed)
[employer] 2021-05 - Specimen Collection Study to Evaluate Biomarkers in Subjects with Cancer   ? ?Outgoing call: [phone] ? ?LVMOM with patient regarding Dr. Gearldine Shown referral to study. I introduced myself and provided patient with a brief description of the study. Patient was informed that participation is voluntary and that if he is interested, I can email him the study consent for information purposes to read over. I also informed patient that blood collection needs to occur prior to start of any treatment, patient scheduled for radiation consult tomorrow.  ?Patient thanked. My call back number provided.  ? ?Maxwell Marion, RN, BSN, [place] ?Clinical Research Nurse Lead ?02/11/2022 2:18 PM ? ? ? ?

## 2022-02-11 NOTE — Progress Notes (Signed)
GI Location of Tumor / Histology: Rectal Cancer ? ?[name] presented in September 2022 after a positive Cologuard.  He developed progressive lower abdominal pain with bowel movements with intermittent bleeding and difficulty having  bowel movements. ? ?CT Soft tissue Neck 02/10/2022: ? ?MRI Rectum 01/22/2022: T3cN2 mucinous high rectal mass located above and below the peritoneal reflection. ?  ? ?Proctoscopy 01/09/2022: Visible mass at 11.5 cm. ? ?MRI Abdomen 01/07/2022: Innumerable non-enhancing lesions consistent with cysts, gastric wall thickening and no evidence of bowel obstruction. ? ?CT CAP 12/31/2021: Mass-like circumferential wall thickening of the distal sigmoid colon suspicious for malignancy with mildly enlarged pericolonic lymph nodes.  There were also some centimeter hypodense lesion in the liver and difficulty to characterize. ? ?Colonoscopy 12/26/2021: Partially obstructing mass in the rectosigmoid colon at 15 cm.  The mass was circumferential. ? ? ?Biopsies of Rectal Mass 01/09/2022 ? ? ? ?Past/Anticipated interventions by surgeon, if any:  ?Dr. Morton Stall 01/09/2022 ?-Loop ileostomy 01/15/2022 ? ? ?Past/Anticipated interventions by medical oncology, if any:  ?Dr. [name] 02/06/2022 ?-The tumor appears to be best characterized as a high rectal cancer as opposed to a sigmoid colon tumor. ?-He appears to have locally advanced disease based on the staging evaluation to date.   ?-We will refer him for a neck CT to evaluate the palpable abnormality on exam today. ?-If the neck imaging is negative the plan is to proceed with total neoadjuvant therapy.  He will be referred to Dr. Lisbeth Renshaw.  I will coordinate sequencing of the neoadjuvant chemotherapy and radiation with Dr. Lisbeth Renshaw.  ?- The plan is to begin neoadjuvant FOLFOX on 02/17/2022. ? ? ? ?Weight changes, if any: Lost about 5 pounds within the last month.  He has lost close to 50 pounds since around August 2022. ? ?Bowel/Bladder complaints, if any: He empties  his ileostomy 6-8 times per day.  He reports some formed stool.  He is using imodium PRN. ? ?Nausea / Vomiting, if any: No ? ?Pain issues, if any: No  ? ?Any blood per rectum: He reports some blood and mucus per rectum. ? ?SAFETY ISSUES: ?Prior radiation? No ?Pacemaker/ICD? No ?Possible current pregnancy? N/a ?Is the patient on methotrexate? No ? ?Current Complaints/Details: ?Port insertion 02/12/2022 ? ?Photo chemotherapy for psoriasis in the mid 80's ?

## 2022-02-11 NOTE — Telephone Encounter (Signed)
[employer] 2021-05 - Specimen Collection Study to Evaluate Biomarkers in Subjects with Cancer   ? ?Incoming call: [phone] ? ?Return call from patient's spouse. Mrs. Melendrez informed me that Mr. Begue is interested in the study and would like to participate. We made arrangements for me to email the study consent form for information only and for them to review and call me with questions, should they have any. I reiterated to Mrs. Walen that participation is voluntary and we discussed a few options of when to meet for consent review, signing and for blood specimen to be drawn. Mrs. Fabiano will call me with their preferred date. I informed her that consent and blood draw needs to occur prior to start of any treatment, she gave her verbal understanding. I thanked her for her return call and encouraged them to call me with questions, should they have any after reviewing the informed consent.  ?Informed Consent and Authorization Form Version 30 Nov 2020, sent to patient's verified email address.  ? ?Maxwell Marion, RN, BSN, [place] ?Clinical Research Nurse Lead ?02/11/2022 3:58 PM ? ? ? ?

## 2022-02-12 ENCOUNTER — Ambulatory Visit (HOSPITAL_COMMUNITY)
Admission: RE | Admit: 2022-02-12 | Discharge: 2022-02-12 | Disposition: A | Discharge status: Home / Self Care | Payer: BC Managed Care – PPO | Source: Ambulatory Visit | Attending: Oncology | Admitting: Oncology

## 2022-02-12 ENCOUNTER — Other Ambulatory Visit (HOSPITAL_COMMUNITY): Payer: Self-pay | Admitting: Surgical Oncology

## 2022-02-12 ENCOUNTER — Other Ambulatory Visit: Payer: BC Managed Care – PPO

## 2022-02-12 ENCOUNTER — Other Ambulatory Visit: Payer: Self-pay | Admitting: Oncology

## 2022-02-12 ENCOUNTER — Other Ambulatory Visit (HOSPITAL_COMMUNITY): Payer: Self-pay | Admitting: Oncology

## 2022-02-12 ENCOUNTER — Other Ambulatory Visit: Payer: Self-pay

## 2022-02-12 ENCOUNTER — Encounter: Payer: Self-pay | Admitting: Licensed Clinical Social Worker

## 2022-02-12 ENCOUNTER — Telehealth: Payer: Self-pay | Admitting: Medical Oncology

## 2022-02-12 ENCOUNTER — Encounter (HOSPITAL_COMMUNITY): Payer: Self-pay

## 2022-02-12 ENCOUNTER — Other Ambulatory Visit (HOSPITAL_COMMUNITY): Payer: Self-pay | Admitting: Interventional Radiology

## 2022-02-12 DIAGNOSIS — Z452 Encounter for adjustment and management of vascular access device: Secondary | ICD-10-CM | POA: Diagnosis not present

## 2022-02-12 DIAGNOSIS — C20 Malignant neoplasm of rectum: Secondary | ICD-10-CM

## 2022-02-12 DIAGNOSIS — R590 Localized enlarged lymph nodes: Secondary | ICD-10-CM | POA: Diagnosis not present

## 2022-02-12 DIAGNOSIS — Z79899 Other long term (current) drug therapy: Secondary | ICD-10-CM | POA: Diagnosis not present

## 2022-02-12 HISTORY — PX: IR IMAGING GUIDED PORT INSERTION: IMG5740

## 2022-02-12 HISTORY — PX: IR US GUIDE BX ASP/DRAIN: IMG2392

## 2022-02-12 MED ORDER — LIDOCAINE-EPINEPHRINE 1 %-1:100000 IJ SOLN
INTRAMUSCULAR | Status: AC
  Filled 2022-02-12: qty 1

## 2022-02-12 MED ORDER — HEPARIN SOD (PORK) LOCK FLUSH 100 UNIT/ML IV SOLN
INTRAVENOUS | Status: DC
Start: 2022-02-12 — End: 2022-02-12
  Filled 2022-02-12: qty 5

## 2022-02-12 MED ORDER — LIDOCAINE-EPINEPHRINE 1 %-1:100000 IJ SOLN
INTRAMUSCULAR | Status: AC | PRN
  Administered 2022-02-12: 20 mL

## 2022-02-12 MED ORDER — SODIUM CHLORIDE 0.9 % IV SOLN: INTRAVENOUS | Status: DC

## 2022-02-12 MED ORDER — MIDAZOLAM HCL 2 MG/2ML IJ SOLN
INTRAMUSCULAR | Status: AC | PRN
  Administered 2022-02-12: 1 mg via INTRAVENOUS
  Administered 2022-02-12 (×2): .5 mg via INTRAVENOUS

## 2022-02-12 MED ORDER — MIDAZOLAM HCL 2 MG/2ML IJ SOLN
INTRAMUSCULAR | Status: DC
Start: 2022-02-12 — End: 2022-02-12
  Filled 2022-02-12: qty 2

## 2022-02-12 MED ORDER — FENTANYL CITRATE (PF) 100 MCG/2ML IJ SOLN
INTRAMUSCULAR | Status: AC | PRN
  Administered 2022-02-12: 50 ug via INTRAVENOUS
  Administered 2022-02-12 (×2): 25 ug via INTRAVENOUS

## 2022-02-12 MED ORDER — FENTANYL CITRATE (PF) 100 MCG/2ML IJ SOLN
INTRAMUSCULAR | Status: AC
  Filled 2022-02-12: qty 2

## 2022-02-12 MED ORDER — LIDOCAINE HCL 1 % IJ SOLN
INTRAMUSCULAR | Status: DC
Start: 2022-02-12 — End: 2022-02-12
  Filled 2022-02-12: qty 20

## 2022-02-12 NOTE — H&P (Signed)
? ?Chief Complaint: ?Patient was seen in consultation today for image guided PAC placement at the request of Sherrill,Gary B ? ?Referring Physician(s): Sherrill,Gary B ? ?Supervising Physician: Daryll Brod ? ?Patient Status: [facility] - Out-pt ? ?History of Present Illness: ?Kenneth [name] is a 61 y.o. male with PMHs of recent diagnosis of rectal cancer, biopsy proven on 01/09/22.  ? ?Patient had positive Cologuard test in September 2022, patient was referred to GI for colonoscopy on 12/26/21 which showed a partially obstructing mass in the rectosigmoid at 15 cm. Patient underwent CT on 12/31/21 which showed:  ? ?1. Segment of masslike circumferential wall thickening identified of ?the distal sigmoid colon which is suspicious for ?neoplasm/malignancy. Correlate with colonoscopy results. ?2. Accompanying mildly enlarged pericolonic lymph nodes at the ?distal sigmoid, which may be metastatic. ?3. Numerous subcentimeter hepatic hypodensities throughout the liver ?which are too small to characterize. Most likely represent small ?cysts and/or hemangiomas, however given the colonic mass an MRI ?abdomen with contrast could be considered for confirmation. ?4. Splenomegaly. ?5. Other ancillary findings as described. ? ?Patient underwent proctoscopy at [facility] on 01/09/22, pathology revealed invasive moderately differentiated adenocarcinoma. Patient was referred to med onc and established care with Dr. Vale Haven, who recommended chemotherapy along with port-a-catheter placement. After thorough discussion and shared decision making, patient decided to proceed.  ? ?IR was requested for [facility] placement.  ?Patient is scheduled for [facility] placement today at [facility] IR, Dr. Ammie Dalton reached out to Dr. Earleen Newport this AM, and asked if LAN bx can be done at the same time of [facility] placement. Of note, patient underwent CT soft tissue neck yesterday which showed 9.5 mm left supraclavicular lymph node posterior to the ?sternocleidomastoid muscle. ?Image  reviewed and case is approved for US guided Left supraclavicular LAN bx By Dr. Earleen Newport.  ? ?Patient laying in bed, not in acute distress.  ?Reports mild HA due to dehydration. ?Denise headache, fever, chills, shortness of breath, cough, chest pain, abdominal pain, nausea ,vomiting, and bleeding. ? ? ? ?History reviewed. No pertinent past medical history. ? ?History reviewed. No pertinent surgical history. ? ?Allergies: ?Patient has no known allergies. ? ?Medications: ?Prior to Admission medications   ?Medication Sig Start Date End Date Taking? Authorizing Provider  ?Cholecalciferol (VITAMIN D3) 125 MCG (5000 UT) CAPS Take by mouth. 2 daily   Yes [provider]  ?Cyanocobalamin (VITAMIN B 12) 500 MCG TABS Take by mouth. 2 daily   Yes [provider]  ?ferrous sulfate 325 (65 FE) MG tablet Take 325 mg by mouth daily with breakfast.   Yes [provider]  ?melatonin 5 MG TABS Take 5 mg by mouth.   Yes [provider]  ?omeprazole (PRILOSEC) 40 MG capsule Take 40 mg by mouth daily. 01/04/20  Yes [provider]  ?Turmeric 500 MG CAPS Take by mouth.   Yes [provider]  ?vitamin C (ASCORBIC ACID) 500 MG tablet Take 500 mg by mouth daily.   Yes [provider]  ?Vitamin D, Ergocalciferol, (DRISDOL) 1.25 MG (50000 UT) CAPS capsule Take 1 capsule (50,000 Units total) by mouth every 7 (seven) days. 10/27/18  Yes Lyndal Pulley, DO  ?albuterol (VENTOLIN HFA) 108 (90 Base) MCG/ACT inhaler Inhale 2 puffs into the lungs every 6 (six) hours as needed for wheezing or shortness of breath. ?Patient not taking: Reported on 02/06/2022 11/04/21   Mar Daring, PA-C  ?allopurinol (ZYLOPRIM) 100 MG tablet Take 1 tablet (100 mg total) by mouth 2 (two) times daily. 06/10/21  Newt Minion, MD  ?amLODipine-benazepril (LOTREL) 5-20 MG per capsule  06/18/15   [provider]  ?benzonatate (TESSALON) 100 MG capsule Take 1 capsule (100 mg total) by mouth 3 (three)  times daily as needed. ?Patient not taking: Reported on 02/06/2022 11/04/21   Mar Daring, PA-C  ?colchicine 0.6 MG tablet Take 1 tablet (0.6 mg total) by mouth 2 (two) times daily as needed. 06/07/21 06/07/22  Persons, Bevely Palmer, PA  ?lidocaine-prilocaine (EMLA) cream Apply 1 application. topically as needed (Start using with 2nd treatment, apply to [facility] site 1-2 hours before treatment). 02/10/22   Ladell Pier, MD  ?ondansetron (ZOFRAN) 8 MG tablet Take 1 tablet (8 mg total) by mouth every 8 (eight) hours as needed for nausea or vomiting (As needed for nausea/vomiting STARTING day 4 after chemo). 02/10/22   Ladell Pier, MD  ?predniSONE (DELTASONE) 20 MG tablet Take 2 tablets (40 mg total) by mouth daily with breakfast. ?Patient not taking: Reported on 02/06/2022 11/04/21   Mar Daring, PA-C  ?prochlorperazine (COMPAZINE) 10 MG tablet Take 1 tablet (10 mg total) by mouth every 6 (six) hours as needed for nausea or vomiting. 02/10/22   Ladell Pier, MD  ?  ? ?History reviewed. No pertinent family history. ? ?Social History  ? ?Socioeconomic History  ? Marital status: Married  ?  Spouse name: Not on file  ? Number of children: Not on file  ? Years of education: Not on file  ? Highest education level: Not on file  ?Occupational History  ? Not on file  ?Tobacco Use  ? Smoking status: Some Days  ? Smokeless tobacco: Never  ?Vaping Use  ? Vaping Use: Never used  ?Substance and Sexual Activity  ? Alcohol use: Not Currently  ?  Alcohol/week: 0.0 standard drinks  ? Drug use: Not Currently  ? Sexual activity: Not on file  ?Other Topics Concern  ? Not on file  ?Social History Narrative  ? Not on file  ? ?Social Determinants of Health  ? ?Financial Resource Strain: Not on file  ?Food Insecurity: Not on file  ?Transportation Needs: Not on file  ?Physical Activity: Not on file  ?Stress: Not on file  ?Social Connections: Not on file  ? ? ? ?Review of Systems: A 12 point ROS discussed and pertinent  positives are indicated in the HPI above.  All other systems are negative. ? ?Vital Signs: ?BP 127/88   Pulse 81   Temp 97.9 ?F (36.6 ?C) (Oral)   Resp 20   Ht 5' 11.5" (1.816 m)   Wt 205 lb (93 kg)   SpO2 96%   BMI 28.19 kg/m?  ? ? ?Physical Exam ?Vitals reviewed.  ?Constitutional:   ?   General: He is not in acute distress. ?   Appearance: Normal appearance. He is not ill-appearing.  ?HENT:  ?   Head: Normocephalic and atraumatic.  ?   Mouth/Throat:  ?   Mouth: Mucous membranes are dry.  ?   Pharynx: Oropharynx is clear.  ?Cardiovascular:  ?   Rate and Rhythm: Normal rate and regular rhythm.  ?   Heart sounds: Normal heart sounds.  ?Pulmonary:  ?   Effort: Pulmonary effort is normal.  ?   Breath sounds: Wheezing present.  ?   Comments: Inspiratory wheezing on RUL. Patient is a smoker. ?Abdominal:  ?   General: Bowel sounds are normal.  ?   Palpations: Abdomen is soft.  ?Musculoskeletal:  ?  Cervical back: Neck supple.  ?Skin: ?   General: Skin is warm and dry.  ?   Coloration: Skin is not jaundiced or pale.  ?Neurological:  ?   Mental Status: He is alert and oriented to person, place, and time.  ?Psychiatric:     ?   Mood and Affect: Mood normal.     ?   Behavior: Behavior normal.     ?   Judgment: Judgment normal.  ? ? ?MD Evaluation ?Airway: WNL ?Heart: WNL ?Abdomen: WNL ?Chest/ Lungs: WNL ?ASA  Classification: 3 ?Mallampati/Airway Score: Two ? ?Imaging: ?CT Soft Tissue Neck Wo Contrast ? ?Result Date: 02/11/2022 ?CLINICAL DATA:  New diagnosis rectal cancer. Question left scalene node. EXAM: CT NECK WITHOUT CONTRAST TECHNIQUE: Multidetector CT imaging of the neck was performed following the standard protocol without intravenous contrast. RADIATION DOSE REDUCTION: This exam was performed according to the departmental dose-optimization program which includes automated exposure control, adjustment of the mA and/or kV according to patient size and/or use of iterative reconstruction technique. COMPARISON:   None. FINDINGS: Pharynx and larynx: Normal. No mass or swelling. Salivary glands: No inflammation, mass, or stone. Thyroid: Negative Lymph nodes: 9.5 mm lymph node in the left supraclavicular region posterior to the sternoc

## 2022-02-12 NOTE — Procedures (Signed)
Interventional Radiology Procedure Note ? ?Procedure: RT IJ POWER PORT ? ?LEFT SUPRACLAVICULAR NODE OCRE BX   ? ?Complications: None ? ?Estimated Blood Loss:  MIN ? ?Findings: ?TIP SVCRA ? ?[place]   ? ?M. Daryll Brod, MD ? ? ? ?

## 2022-02-12 NOTE — Telephone Encounter (Signed)
[employer] 2021-05 - Specimen Collection Study to Evaluate Biomarkers in Subjects with Cancer   ? ?Outgoing call: [phone] ? ?LVMOM with patient informing him that due to him having a biopsy today, per the study we have to have three days after before blood sample can be drawn. Informed patient that if he is still interested in participating with study, we can complete the consent signing and blood draw on Monday, when he is in clinic at [facility] for his first treatment appointment. Patient thanked, asked him to return call should he have questions.  ? ?Maxwell Marion, RN, BSN, [place] ?Clinical Research Nurse Lead ?02/12/2022 3:24 PM ? ?

## 2022-02-13 ENCOUNTER — Ambulatory Visit
Admission: RE | Admit: 2022-02-13 | Discharge: 2022-02-13 | Disposition: A | Discharge status: Home / Self Care | Payer: BC Managed Care – PPO | Source: Ambulatory Visit | Attending: Radiation Oncology | Admitting: Radiation Oncology

## 2022-02-13 ENCOUNTER — Telehealth: Payer: Self-pay | Admitting: Medical Oncology

## 2022-02-13 ENCOUNTER — Encounter: Payer: Self-pay | Admitting: Radiation Oncology

## 2022-02-13 ENCOUNTER — Other Ambulatory Visit: Payer: Self-pay

## 2022-02-13 VITALS — BP 125/84 | HR 75 | Temp 97.0°F | Resp 18 | Ht 71.5 in | Wt 204.4 lb

## 2022-02-13 DIAGNOSIS — Z87891 Personal history of nicotine dependence: Secondary | ICD-10-CM | POA: Diagnosis not present

## 2022-02-13 DIAGNOSIS — Z7951 Long term (current) use of inhaled steroids: Secondary | ICD-10-CM | POA: Insufficient documentation

## 2022-02-13 DIAGNOSIS — Z800 Family history of malignant neoplasm of digestive organs: Secondary | ICD-10-CM | POA: Diagnosis not present

## 2022-02-13 DIAGNOSIS — C20 Malignant neoplasm of rectum: Secondary | ICD-10-CM | POA: Insufficient documentation

## 2022-02-13 DIAGNOSIS — Z79899 Other long term (current) drug therapy: Secondary | ICD-10-CM | POA: Diagnosis not present

## 2022-02-13 DIAGNOSIS — Z806 Family history of leukemia: Secondary | ICD-10-CM | POA: Insufficient documentation

## 2022-02-13 DIAGNOSIS — Z803 Family history of malignant neoplasm of breast: Secondary | ICD-10-CM | POA: Diagnosis not present

## 2022-02-13 NOTE — Telephone Encounter (Signed)
[employer] 2021-05 - Specimen Collection Study to Evaluate Biomarkers in Subjects with Cancer   ? ?Incoming call: [phone] ? ?Patient's spouse left vm for me informing me that they received my call yesterday and plan on meeting with me Monday morning, when in clinic for start of first treatment. Spouse did inform me that they never received my email with the consent form study information I sent out two days ago. I have resent this information, noting an error in patient email address the first time.  ? ?Maxwell Marion, RN, BSN, [place] ?Clinical Research Nurse Lead ?02/13/2022 9:54 AM  ?

## 2022-02-14 ENCOUNTER — Encounter: Payer: Self-pay | Admitting: *Deleted

## 2022-02-14 ENCOUNTER — Encounter: Payer: Self-pay | Admitting: Oncology

## 2022-02-14 DIAGNOSIS — Z932 Ileostomy status: Secondary | ICD-10-CM | POA: Diagnosis not present

## 2022-02-14 LAB — SURGICAL PATHOLOGY

## 2022-02-14 NOTE — Progress Notes (Signed)
[place] ?Clinical Social Work ? ?Clinical Social Work was referred by nurse navigator for assessment of psychosocial needs.  Clinical Social Worker contacted patient by phone  to offer support and assess for needs.  CSW left voicemail with contact information and request for return call. ? ?First Attempt ? ?Emanuelle Hammerstrom, LCSW  ?Clinical Social Worker ?[place] ?      ? ?

## 2022-02-14 NOTE — Progress Notes (Signed)
PATIENT NAVIGATOR PROGRESS NOTE ? ?Name: ALEXANDER AUMENT ?Date: 02/14/2022 ?MRN: [id number]  ?DOB: 1961/06/23 ? ? ?Reason for visit:  ?Telephone results ? ?Comments:  Spoke with Ms Berrios and gave path report results of benign lymph node from Wednesday biopsy. ? ?Appreciated call no further questions To begin treatment on Monday as scheduled ? ? ? ?Time spent counseling/coordinating care: 30-45 minutes ? ?

## 2022-02-15 ENCOUNTER — Other Ambulatory Visit: Payer: Self-pay | Admitting: Oncology

## 2022-02-17 ENCOUNTER — Inpatient Hospital Stay: Payer: BC Managed Care – PPO

## 2022-02-17 ENCOUNTER — Encounter: Payer: Self-pay | Admitting: *Deleted

## 2022-02-17 ENCOUNTER — Ambulatory Visit
Admission: RE | Admit: 2022-02-17 | Discharge: 2022-02-17 | Disposition: A | Discharge status: Home / Self Care | Payer: Self-pay | Source: Ambulatory Visit | Attending: Radiation Oncology | Admitting: Radiation Oncology

## 2022-02-17 ENCOUNTER — Encounter: Payer: Self-pay | Admitting: Medical Oncology

## 2022-02-17 ENCOUNTER — Inpatient Hospital Stay: Payer: BC Managed Care – PPO | Attending: Nurse Practitioner | Admitting: Nurse Practitioner

## 2022-02-17 ENCOUNTER — Other Ambulatory Visit: Payer: Self-pay | Admitting: Radiation Oncology

## 2022-02-17 ENCOUNTER — Encounter: Payer: Self-pay | Admitting: Nurse Practitioner

## 2022-02-17 VITALS — BP 123/83 | HR 71 | Temp 97.8°F | Resp 18 | Wt 203.4 lb

## 2022-02-17 DIAGNOSIS — C20 Malignant neoplasm of rectum: Secondary | ICD-10-CM

## 2022-02-17 DIAGNOSIS — Z5111 Encounter for antineoplastic chemotherapy: Secondary | ICD-10-CM | POA: Insufficient documentation

## 2022-02-17 DIAGNOSIS — Z932 Ileostomy status: Secondary | ICD-10-CM | POA: Insufficient documentation

## 2022-02-17 DIAGNOSIS — Z5189 Encounter for other specified aftercare: Secondary | ICD-10-CM | POA: Insufficient documentation

## 2022-02-17 LAB — CBC WITH DIFFERENTIAL (CANCER CENTER ONLY)
Abs Immature Granulocytes: 0 10*3/uL (ref 0.00–0.07)
Basophils Absolute: 0 10*3/uL (ref 0.0–0.1)
Basophils Relative: 1 %
Eosinophils Absolute: 0.3 10*3/uL (ref 0.0–0.5)
Eosinophils Relative: 7 %
HCT: 40.2 % (ref 39.0–52.0)
Hemoglobin: 12.7 g/dL — ABNORMAL LOW (ref 13.0–17.0)
Immature Granulocytes: 0 %
Lymphocytes Relative: 27 %
Lymphs Abs: 1.3 10*3/uL (ref 0.7–4.0)
MCH: 24.6 pg — ABNORMAL LOW (ref 26.0–34.0)
MCHC: 31.6 g/dL (ref 30.0–36.0)
MCV: 77.9 fL — ABNORMAL LOW (ref 80.0–100.0)
Monocytes Absolute: 0.4 10*3/uL (ref 0.1–1.0)
Monocytes Relative: 7 %
Neutro Abs: 2.8 10*3/uL (ref 1.7–7.7)
Neutrophils Relative %: 58 %
Platelet Count: 205 10*3/uL (ref 150–400)
RBC: 5.16 MIL/uL (ref 4.22–5.81)
RDW: 22.4 % — ABNORMAL HIGH (ref 11.5–15.5)
WBC Count: 4.9 10*3/uL (ref 4.0–10.5)
nRBC: 0 % (ref 0.0–0.2)

## 2022-02-17 LAB — CMP (CANCER CENTER ONLY)
ALT: 17 U/L (ref 0–44)
AST: 20 U/L (ref 15–41)
Albumin: 4.2 g/dL (ref 3.5–5.0)
Alkaline Phosphatase: 75 U/L (ref 38–126)
Anion gap: 8 (ref 5–15)
BUN: 19 mg/dL (ref 6–20)
CO2: 29 mmol/L (ref 22–32)
Calcium: 9.4 mg/dL (ref 8.9–10.3)
Chloride: 102 mmol/L (ref 98–111)
Creatinine: 1.39 mg/dL — ABNORMAL HIGH (ref 0.61–1.24)
GFR, Estimated: 58 mL/min — ABNORMAL LOW (ref >60)
Glucose, Bld: 83 mg/dL (ref 70–99)
Potassium: 3.9 mmol/L (ref 3.5–5.1)
Sodium: 139 mmol/L (ref 135–145)
Total Bilirubin: 0.3 mg/dL (ref 0.3–1.2)
Total Protein: 6.9 g/dL (ref 6.5–8.1)

## 2022-02-17 LAB — MAGNESIUM: Magnesium: 2 mg/dL (ref 1.7–2.4)

## 2022-02-17 MED ORDER — DEXTROSE 5 % IV SOLN: Freq: Once | INTRAVENOUS | Status: AC

## 2022-02-17 MED ORDER — OXALIPLATIN CHEMO INJECTION 100 MG/20ML
85.0000 mg/m2 | Freq: Once | INTRAVENOUS | Status: AC
  Administered 2022-02-17: 185 mg via INTRAVENOUS
  Filled 2022-02-17: qty 37

## 2022-02-17 MED ORDER — PALONOSETRON HCL INJECTION 0.25 MG/5ML
0.2500 mg | Freq: Once | INTRAVENOUS | Status: AC
  Administered 2022-02-17: 0.25 mg via INTRAVENOUS
  Filled 2022-02-17: qty 5

## 2022-02-17 MED ORDER — SODIUM CHLORIDE 0.9 % IV SOLN
10.0000 mg | Freq: Once | INTRAVENOUS | Status: AC
  Administered 2022-02-17: 10 mg via INTRAVENOUS
  Filled 2022-02-17: qty 1

## 2022-02-17 MED ORDER — SODIUM CHLORIDE 0.9 % IV SOLN
5000.0000 mg | INTRAVENOUS | Status: DC
  Administered 2022-02-17: 5000 mg via INTRAVENOUS
  Filled 2022-02-17: qty 100

## 2022-02-17 MED ORDER — FLUOROURACIL CHEMO INJECTION 2.5 GM/50ML
400.0000 mg/m2 | Freq: Once | INTRAVENOUS | Status: AC
  Administered 2022-02-17: 850 mg via INTRAVENOUS
  Filled 2022-02-17: qty 17

## 2022-02-17 MED ORDER — LEUCOVORIN CALCIUM INJECTION 350 MG
400.0000 mg/m2 | Freq: Once | INTRAVENOUS | Status: AC
  Administered 2022-02-17: 864 mg via INTRAVENOUS
  Filled 2022-02-17: qty 43.2

## 2022-02-17 NOTE — Progress Notes (Signed)
Patient seen by Lisa Thomas NP today  Vitals are within treatment parameters.  Labs reviewed by Lisa Thomas NP and are within treatment parameters.  Per physician team, patient is ready for treatment and there are NO modifications to the treatment plan.     

## 2022-02-17 NOTE — Patient Instructions (Signed)
[facility]   ?Discharge Instructions: ?Thank you for choosing [facility] to provide your oncology and hematology care.  ? ?If you have a lab appointment with the [facility], please go directly to the [facility] and check in at the registration area. ?  ?Wear comfortable clothing and clothing appropriate for easy access to any Portacath or PICC line.  ? ?We strive to give you quality time with your provider. You may need to reschedule your appointment if you arrive late (15 or more minutes).  Arriving late affects you and other patients whose appointments are after yours.  Also, if you miss three or more appointments without notifying the office, you may be dismissed from the clinic at the provider?s discretion.    ?  ?For prescription refill requests, have your pharmacy contact our office and allow 72 hours for refills to be completed.   ? ?Today you received the following chemotherapy and/or immunotherapy agents Oxaliplatin (ELOXATIN), Leucovorin & Flourouracil (ADRUCIL).    ?  ?To help prevent nausea and vomiting after your treatment, we encourage you to take your nausea medication as directed. ? ?BELOW ARE SYMPTOMS THAT SHOULD BE REPORTED IMMEDIATELY: ?*FEVER GREATER THAN 100.4 F (38 ?C) OR HIGHER ?*CHILLS OR SWEATING ?*NAUSEA AND VOMITING THAT IS NOT CONTROLLED WITH YOUR NAUSEA MEDICATION ?*UNUSUAL SHORTNESS OF BREATH ?*UNUSUAL BRUISING OR BLEEDING ?*URINARY PROBLEMS (pain or burning when urinating, or frequent urination) ?*BOWEL PROBLEMS (unusual diarrhea, constipation, pain near the anus) ?TENDERNESS IN MOUTH AND THROAT WITH OR WITHOUT PRESENCE OF ULCERS (sore throat, sores in mouth, or a toothache) ?UNUSUAL RASH, SWELLING OR PAIN  ?UNUSUAL VAGINAL DISCHARGE OR ITCHING  ? ?Items with * indicate a potential emergency and should be followed up as soon as possible or go to the Emergency Department if any problems should occur. ? ?Please show the CHEMOTHERAPY ALERT  CARD or IMMUNOTHERAPY ALERT CARD at check-in to the Emergency Department and triage nurse. ? ?Should you have questions after your visit or need to cancel or reschedule your appointment, please contact [facility]  Dept: [phone]  and follow the prompts.  Office hours are 8:00 a.m. to 4:30 p.m. Monday - Friday. Please note that voicemails left after 4:00 p.m. may not be returned until the following business day.  We are closed weekends and major holidays. You have access to a nurse at all times for urgent questions. Please call the main number to the clinic Dept: [phone] and follow the prompts. ? ? ?For any non-urgent questions, you may also contact your provider using MyChart. We now offer e-Visits for anyone 16 and older to request care online for non-urgent symptoms. For details visit [email or web address]. ?  ?Also download the MyChart app! Go to the app store, search "MyChart", open the app, select [facility], and log in with your MyChart username and password. ? ?Due to Covid, a mask is required upon entering the hospital/clinic. If you do not have a mask, one will be given to you upon arrival. For doctor visits, patients may have 1 support person aged 4 or older with them. For treatment visits, patients cannot have anyone with them due to current Covid guidelines and our immunocompromised population.  ? ?Oxaliplatin Injection ?What is this medication? ?OXALIPLATIN (ox AL i PLA tin) is a chemotherapy drug. It targets fast dividing cells, like cancer cells, and causes these cells to die. This medicine is used to treat cancers of the colon and rectum, and  many other cancers. ?This medicine may be used for other purposes; ask your health care provider or pharmacist if you have questions. ?COMMON BRAND NAME(S): Eloxatin ?What should I tell my care team before I take this medication? ?They need to know if you have any of these conditions: ?heart disease ?history of irregular  heartbeat ?liver disease ?low blood counts, like white cells, platelets, or red blood cells ?lung or breathing disease, like asthma ?take medicines that treat or prevent blood clots ?tingling of the fingers or toes, or other nerve disorder ?an unusual or allergic reaction to oxaliplatin, other chemotherapy, other medicines, foods, dyes, or preservatives ?pregnant or trying to get pregnant ?breast-feeding ?How should I use this medication? ?This drug is given as an infusion into a vein. It is administered in a hospital or clinic by a specially trained health care professional. ?Talk to your pediatrician regarding the use of this medicine in children. Special care may be needed. ?Overdosage: If you think you have taken too much of this medicine contact a poison control center or emergency room at once. ?NOTE: This medicine is only for you. Do not share this medicine with others. ?What if I miss a dose? ?It is important not to miss a dose. Call your doctor or health care professional if you are unable to keep an appointment. ?What may interact with this medication? ?Do not take this medicine with any of the following medications: ?cisapride ?dronedarone ?pimozide ?thioridazine ?This medicine may also interact with the following medications: ?aspirin and aspirin-like medicines ?certain medicines that treat or prevent blood clots like warfarin, apixaban, dabigatran, and rivaroxaban ?cisplatin ?cyclosporine ?diuretics ?medicines for infection like acyclovir, adefovir, amphotericin B, bacitracin, cidofovir, foscarnet, ganciclovir, gentamicin, pentamidine, vancomycin ?NSAIDs, medicines for pain and inflammation, like ibuprofen or naproxen ?other medicines that prolong the QT interval (an abnormal heart rhythm) ?pamidronate ?zoledronic acid ?This list may not describe all possible interactions. Give your health care provider a list of all the medicines, herbs, non-prescription drugs, or dietary supplements you use. Also tell  them if you smoke, drink alcohol, or use illegal drugs. Some items may interact with your medicine. ?What should I watch for while using this medication? ?Your condition will be monitored carefully while you are receiving this medicine. ?You may need blood work done while you are taking this medicine. ?This medicine may make you feel generally unwell. This is not uncommon as chemotherapy can affect healthy cells as well as cancer cells. Report any side effects. Continue your course of treatment even though you feel ill unless your healthcare professional tells you to stop. ?This medicine can make you more sensitive to cold. Do not drink cold drinks or use ice. Cover exposed skin before coming in contact with cold temperatures or cold objects. When out in cold weather wear warm clothing and cover your mouth and nose to warm the air that goes into your lungs. Tell your doctor if you get sensitive to the cold. ?Do not become pregnant while taking this medicine or for 9 months after stopping it. Women should inform their health care professional if they wish to become pregnant or think they might be pregnant. Men should not father a child while taking this medicine and for 6 months after stopping it. There is potential for serious side effects to an unborn child. Talk to your health care professional for more information. ?Do not breast-feed a child while taking this medicine or for 3 months after stopping it. ?This medicine has caused ovarian failure in  some women. This medicine may make it more difficult to get pregnant. Talk to your health care professional if you are concerned about your fertility. ?This medicine has caused decreased sperm counts in some men. This may make it more difficult to father a child. Talk to your health care professional if you are concerned about your fertility. ?This medicine may increase your risk of getting an infection. Call your health care professional for advice if you get a fever,  chills, or sore throat, or other symptoms of a cold or flu. Do not treat yourself. Try to avoid being around people who are sick. ?Avoid taking medicines that contain aspirin, acetaminophen, ibuprof

## 2022-02-17 NOTE — Progress Notes (Signed)
PATIENT NAVIGATOR PROGRESS NOTE ? ?Name: Kenneth [name] ?Date: 02/17/2022 ?MRN: [id number]  ?DOB: Aug 24, 1961 ? ? ?Reason for visit:  ?First chemo treatment ? ?Comments:  Met with [name] during first FOLFOX treatment. Stated that he is doing well, trying to keep up with hydration needs with high output ileostomy.  Stated that he has switched to an appliance with convexity and is using stoma powder for barrier and that skin integrity is improving.  Pt report not observed.  ? ?Encouraged to call with any issues or questions ? ? ? ?Time spent counseling/coordinating care: 30-45 minutes ? ?

## 2022-02-17 NOTE — Progress Notes (Signed)
?  Kenneth [name] ?OFFICE PROGRESS NOTE ? ? ?Diagnosis: Rectal cancer ? ?INTERVAL HISTORY:  ? ?Kenneth [name] returns as scheduled.  He estimates emptying the ileostomy collection bag 6 times a day, partially full.  Consistency ranges from watery to thick.  No nausea or vomiting. ? ?Objective: ? ?Vital signs in last 24 hours: ? ?Blood pressure 123/83, pulse 71, temperature 97.8 ?F (36.6 ?C), temperature source Oral, resp. rate 18, weight 203 lb 6.4 oz (92.3 kg), SpO2 99 %. ?  ? ?HEENT: White coating of her tongue.  No buccal thrush.  Mucous membranes appear moist. ?Lymphatics: Palpable 1 to 2 cm left scalene node. ?Resp: Wheezing at the upper lung fields.  No respiratory distress. ?Cardio: Regular rate and rhythm. ?GI: Abdomen soft and nontender.  No hepatomegaly.  Ileostomy collection bag with watery output. ?Vascular: No leg edema. ?Port-A-Cath without erythema. ? ?Lab Results: ? ?Lab Results  ?Component Value Date  ? WBC 4.9 02/17/2022  ? HGB 12.7 (L) 02/17/2022  ? HCT 40.2 02/17/2022  ? MCV 77.9 (L) 02/17/2022  ? PLT 205 02/17/2022  ? NEUTROABS 2.8 02/17/2022  ? ? ?Imaging: ? ?No results found. ? ?Medications: I have reviewed the patient's current medications. ? ?Assessment/Plan: ?Rectal cancer-mass at 11 cm on proctoscopy by Dr. Morton Stall 02/04/2022 ?Colonoscopy 12/26/2021-obstructing mass at the rectosigmoid measured at 15 cm, biopsy invasive moderate to poorly differentiated adenocarcinoma, mismatch repair protein expression intact ?CTs 12/31/2021-masslike circumferential thickening of the distal sigmoid colon with mildly enlarged pericolonic lymph nodes, numerous subcentimeter hypodense liver lesions, splenomegaly ?MRI abdomen 01/07/2022-innumerable T2 hyperintense liver lesions consistent with cysts, spleen unremarkable, no ascites or adenopathy, moderate colonic fecal retention, no bowel obstruction ?MRI pelvis 01/22/2022-T3cN2 tumor above and below the peritoneal reflection, tumor 11.3 cm from the anal verge  and 6.1 cm from the sphincter complex, multiple enlarged perirectal nodes, 1.1 cm node versus tumor deposit at the right aspect of the tumor ?Diverting loop ileostomy 01/15/2022 ?Cycle 1 FOLFOX 02/17/2022 ?Gout ?Psoriasis ?Hypertension ?Left scalene node versus cyst/lipoma on exam 02/03/2022-CT neck 02/10/2022 with 9.5 mm lymph node in the left supraclavicular region posterior to the sternocleidomastoid muscle.  No other prominent lymph nodes in the neck.  Biopsy 02/12/2022 compatible with benign lymph node, negative for metastatic carcinoma. ? ?Disposition: Kenneth [name] appears stable.  He is scheduled to begin total neoadjuvant therapy today with cycle 1 FOLFOX.  We again reviewed potential toxicities.  He agrees to proceed. ? ?CBC and chemistry panel reviewed.  Labs adequate to proceed with treatment.  Creatinine is mildly elevated.  This may be due to dehydration.  We discussed the importance of adequate hydration in the setting of an ileostomy.  He takes Imodium as needed. ? ?He will return for lab, follow-up, cycle 2 FOLFOX in 2 weeks.  We are available to see him sooner if needed. ? ?Patient seen with Dr. [name] ? ?02/17/2022  ?10:39 AM ?This was a shared visit with [name].  Kenneth [name] will begin total neoadjuvant therapy today.  Biopsy of the left neck node was negative for malignancy. ? ?I reviewed the labs from today.  The creatinine is improved.  The plan is to proceed with full dose FOLFOX. ? ?I was present for greater than 50% of today's visit.  I performed medical decision making. ? ?[name], MD ? ? ? ? ? ? ?

## 2022-02-17 NOTE — Progress Notes (Signed)
Patient presents for treatment. RN assessment completed along with the following: ? ?Labs/vitals reviewed - Yes, and within treatment parameters.   ?Weight within 10% of previous measurement - Yes ?Informed consent completed and reflects current therapy/intent - Yes, on date 02/17/2022             ?Provider progress note reviewed - Yes, today's provider note was reviewed. ?Treatment/Antibody/Supportive plan reviewed - Yes, and there are no adjustments needed for today's treatment. ?S&H and other orders reviewed - Yes, and there are no additional orders identified. ?Previous treatment date reviewed - Yes, and the appropriate amount of time has elapsed between treatments. ?Clinic Hand Off Received from - Yes from [place], RN ? ?Patient to proceed with treatment.  ? ?

## 2022-02-17 NOTE — Progress Notes (Signed)
[employer] 2021-05 - Specimen Collection Study to Evaluate Biomarkers in Subjects with Cancer   ? ? ?Patient Kenneth [name] was identified by Dr. [name] as a potential candidate for the above listed study.  This Clinical Research Nurse met with [name], [id number] on 02/17/22 in a manner and location that ensures patient privacy to discuss participation in the above listed research study.  Patient is Accompanied by family this morning .  Patient was previously provided with informed consent documents, via email.  Patient confirmed they have read the informed consent documents. ? ?As outlined in the informed consent form, this Nurse and [name] discussed the purpose of the research study, the investigational nature of the study, study procedures and requirements for study participation, potential risks and benefits of study participation, as well as alternatives to participation.  This study is not blinded or double-blinded. The patient understands participation is voluntary and he may withdraw from study participation at any time.  This study does not involve randomization.  This study does not involve an investigational drug or device. This study does not involve a placebo. Confidentiality and how the patient's information will be used as part of study participation were discussed.  Patient was informed that the study provides a $50 visa card once blood is drawn for his participation and contribution.    ? ?All questions were answered to patient's satisfaction.  The informed consent with embedded HIPAA language was reviewed page by page.  The patient's mental and emotional status is appropriate to provide informed consent, and the patient verbalizes an understanding of study participation.  Patient has agreed to participate in the above listed research study and has voluntarily signed the informed consent version 30 Nov 2020  with embedded HIPAA language, version 30 Nov 2020  on  02/17/22 at approximately 0930 AM.  The patient was provided with a copy of the signed informed consent form with embedded HIPAA language for their reference.  No study specific procedures were obtained prior to the signing of the informed consent document.  Approximately 20 minutes were spent with the patient reviewing the informed consent documents.  After obtaining informed consent patient, voluntarily signed the optional Release of Information form for use throughout trial participation.  ? ? ?This patient reports past medical history of high blood pressure, and he denies past medical history of coronary artery disease, lupus, Rheumatoid Arthritis, diabetes and Lynch Syndrome.  This patient is not taking magnesium supplements.  The patient does report family history of cancer in 1st or 2nd degree relatives.  Patient reports his father had pancreatic cancer and his mother had breast cancer. The patient reports a former history of alcohol consumption, stopping in 2021, and reporting 12 years of drinking approximately three drinks per week.  The patient reports former history of tobacco use, consisting of cigarette smoking, stopping in 2008, with 14  years of smoking approximately one pack per day. Patient also confirms to be a current cigar smoker, smoking approximatley three "small" cigars per day.  ? ? ?Patient meets eligibility.Second nurse eligibility verification completed by Research Nurse Jeral Fruit. Dr. [name] agrees with enrollment.   ?Once blood sample was collected, via [facility], and history questions completed, patient was provided with $50 visa card ending in #[id number]. ?I thanked patient for his time and contribution to study and encouraged him to contact Dr. [name] or myself for any questions or concerns.  ?Specimen kit # used [id number] ? ?Maxwell Marion, RN, BSN, [place] ?Clinical  Research Nurse Lead ?02/17/2022 3:39 PM ? ? ?

## 2022-02-17 NOTE — Patient Instructions (Signed)

## 2022-02-17 NOTE — Progress Notes (Signed)
Pharmacist Chemotherapy Monitoring - Initial Assessment   ? ?Anticipated start date: 02/17/22  ? ?The following has been reviewed per standard work regarding the patient's treatment regimen: ?The patient's diagnosis, treatment plan and drug doses, and organ/hematologic function ?Lab orders and baseline tests specific to treatment regimen  ?The treatment plan start date, drug sequencing, and pre-medications ?Prior authorization status  ?Patient's documented medication list, including drug-drug interaction screen and prescriptions for anti-emetics and supportive care specific to the treatment regimen ?The drug concentrations, fluid compatibility, administration routes, and timing of the medications to be used ?The patient's access for treatment and lifetime cumulative dose history, if applicable  ?The patient's medication allergies and previous infusion related reactions, if applicable  ? ?Changes made to treatment plan:  ?N/A ? ?Follow up needed:  ?N/A ? ? ?Patrica Duel, RPH, ?02/17/2022  10:57 AM  ?

## 2022-02-18 ENCOUNTER — Encounter: Payer: Self-pay | Admitting: *Deleted

## 2022-02-18 ENCOUNTER — Encounter: Payer: Self-pay | Admitting: Oncology

## 2022-02-18 NOTE — Progress Notes (Signed)
PATIENT NAVIGATOR PROGRESS NOTE ? ?Name: Kenneth [name] ?Date: 02/18/2022 ?MRN: [id number]  ?DOB: 1960/12/09 ? ? ?Reason for visit:  ?F/U after first chemo ? ?Comments:  Called patient and spoke with wife. Stated that patient was still sleeping but had a good night overall, no issues ?Encouraged to hydrate, eat every few hours and walk. ? ?Encouraged to call with any issues or questions. Referral placed to financial advocate and provided phone number. Will continue to follow ? ? ? ?Time spent counseling/coordinating care: 30-45 minutes ? ?

## 2022-02-19 ENCOUNTER — Inpatient Hospital Stay: Payer: BC Managed Care – PPO

## 2022-02-19 VITALS — BP 115/88 | HR 70 | Temp 98.4°F | Resp 18

## 2022-02-19 DIAGNOSIS — Z932 Ileostomy status: Secondary | ICD-10-CM | POA: Diagnosis not present

## 2022-02-19 DIAGNOSIS — Z5189 Encounter for other specified aftercare: Secondary | ICD-10-CM | POA: Diagnosis not present

## 2022-02-19 DIAGNOSIS — C20 Malignant neoplasm of rectum: Secondary | ICD-10-CM | POA: Diagnosis not present

## 2022-02-19 DIAGNOSIS — Z5111 Encounter for antineoplastic chemotherapy: Secondary | ICD-10-CM | POA: Diagnosis not present

## 2022-02-19 MED ORDER — HEPARIN SOD (PORK) LOCK FLUSH 100 UNIT/ML IV SOLN
500.0000 [IU] | Freq: Once | INTRAVENOUS | Status: AC | PRN
  Administered 2022-02-19: 500 [IU]

## 2022-02-19 MED ORDER — SODIUM CHLORIDE 0.9% FLUSH
10.0000 mL | INTRAVENOUS | Status: DC | PRN
  Administered 2022-02-19: 10 mL

## 2022-02-19 NOTE — Patient Instructions (Signed)

## 2022-02-19 NOTE — Research (Signed)
[employer] 2021-05:  ? ?Late entry for 02/17/2022:  ?This Nurse has reviewed this patient's inclusion and exclusion criteria as a second review and confirms VISHWA DAIS is eligible for study participation. Patient may continue with enrollment.  ?Jeral Fruit, RN ?02/19/22 ?11:09 AM ? ?

## 2022-02-20 ENCOUNTER — Encounter: Payer: Self-pay | Admitting: Oncology

## 2022-02-20 ENCOUNTER — Encounter: Payer: Self-pay | Admitting: Nurse Practitioner

## 2022-02-20 ENCOUNTER — Telehealth: Payer: Self-pay

## 2022-02-20 NOTE — Telephone Encounter (Signed)
TC to Pt informed him that he can rotate zofran and compazine but the zofran can not be taken until 3 days after treatment. Inquired if he was doing ok. Pt stated he woke up nauseous took the zofran and he is feeling better. Pt verbalized understanding of instructions ?

## 2022-02-24 ENCOUNTER — Encounter: Payer: Self-pay | Admitting: Licensed Clinical Social Worker

## 2022-02-24 NOTE — Progress Notes (Signed)
[facility] Clinical Social Work  ?Initial Assessment ? ? ?Kenneth [name] is a 61 y.o. year old male contacted by phone. Clinical Social Work was referred by nurse navigator for assessment of psychosocial needs.  ? ?SDOH (Social Determinants of Health) assessments performed: Yes ?  ?Distress Screen completed: Yes ? ?  02/13/2022  ? 12:25 PM  ?ONCBCN DISTRESS SCREENING  ?Screening Type Initial Screening  ?Distress experienced in past week (1-10) 2  ?Practical problem type Food  ?Emotional problem type Adjusting to illness;Adjusting to appearance changes  ? ? ? ? ?Family/Social Information:  ?Housing Arrangement: patient lives with spouse Kenneth [name] . ?Family members/support persons in your life? Family, Medical Staff, and Friends/Colleagues ?Transportation concerns: no  ?Employment: Quit going to work on his/her own. Income source: No income, spouse is sole income earner ?Financial concerns: Yes, due to illness and/or loss of work during treatment ?Type of concern: Utilities, Rent/ mortgage, Phone, Transportation, Medical bills, and Food ?Food access concerns: no, but increasingly concerned due to loss of employment ?Religious or spiritual practice: N/A ?Services Currently in place:  spouse's insurance ? ?Coping/ Adjustment to diagnosis: ?Patient understands treatment plan and what happens next? yes ?Concerns about diagnosis and/or treatment: Pain or discomfort during procedures, Embarrassment, Feelings of anger or sadness, Body image (disfigurement), How I will care for other members of my family, Losing my job, Software engineer by information, Afraid of cancer, and How I will pay for the services I need ?Patient reported stressors: Housing, Insurance underwriter, Work/ school, Publishing rights manager, Haematologist, Depression, Anxiety, Adjusting to my illness, Feeling hopeless, and Facing my mortality ?Hopes and priorities: N/A ?Patient enjoys  N/A ?Current coping skills/ strengths: Average or above average intelligence , Capable of independent living ,  Communication skills , and Supportive family/friends  ? ? ? SUMMARY: ?Current SDOH Barriers:  ?Financial constraints related to one income earner in the home and Level of care concerns ? ?Clinical Social Work Clinical Goal(s):  ?patient will work with Designer, jewellery to address needs related to financial concerns ? ?Interventions: ?Discussed common feeling and emotions when being diagnosed with cancer, and the importance of support during treatment ?Informed patient of the support team roles and support services at [name] ?Provided CSW contact information and encouraged patient to call with any questions or concerns ?Referred patient to servant center, Provided patient with information about CSW role inpatient care and available resources, Provided education regarding Medicaid, disability, and available resources through organizations.  CSW sent consent for [name] and links for financial assistance form different organizations. ? ? ?Follow Up Plan: Patient will contact CSW with any support or resource needs and Patient will sign [name] consent form and email it back to CSW. ?Patient verbalizes understanding of plan: Yes ? ? ? ?Kenneth Manning, LCSW ?

## 2022-02-24 NOTE — Progress Notes (Signed)
[facility] ?Clinical Social Work ? ?Clinical Social Work was referred by nurse navigator for assessment of psychosocial needs.  Clinical Social Worker contacted patient by phone  to offer support and assess for needs.  CSW left voicemail with contact information and request for return call. ? ? ? ? ? ?Shandi Godfrey, LCSW  ?Clinical Social Worker ?[facility] ?      ? ?

## 2022-02-25 ENCOUNTER — Other Ambulatory Visit: Payer: Self-pay | Admitting: *Deleted

## 2022-02-25 ENCOUNTER — Telehealth: Payer: Self-pay | Admitting: *Deleted

## 2022-02-25 ENCOUNTER — Other Ambulatory Visit: Payer: Self-pay | Admitting: Oncology

## 2022-02-25 ENCOUNTER — Ambulatory Visit (HOSPITAL_COMMUNITY): Payer: BC Managed Care – PPO

## 2022-02-25 DIAGNOSIS — C20 Malignant neoplasm of rectum: Secondary | ICD-10-CM

## 2022-02-25 NOTE — Telephone Encounter (Signed)
Mrs. Dhanani reports husband is experiencing severe fatigue. Wants to sleep a lot and has lost 10 lb since his chemotherapy treatment. He is eating small amounts (better in last couple days) and drinking fluids, but unsure how much. Ileostomy output is thinner and he is taking Imodium but she is not sure how many/day. His chest congestion he had has worsened and he is coughing up yellow sputum and she hears wheezing intermittently. He is afebrile and not short of breath. Has been taking OTC cold medications for this.  ?Informed her that he needs to drink a minimum of 64 ounces fluid/day and can take up to #8 Imodium/day to thicken his stool. His compazine can cause him to feel fatigue as well as dehydration. Will make MD aware of call. ? ?

## 2022-02-25 NOTE — Telephone Encounter (Addendum)
Per Dr. [name]: Measure ileostomy output and keep in diary. Take a minimum of #4 Imodium/day. Keep track of oral intake as well. Will bring him in tomorrow for CMP and 1 liter NS. No need to act on congestion since he is afebrile and not short of breath. Notified Mrs. Kitchings to have him here tomorrow at 1145 for lab/fluids. Also provided directions per Dr. [name] request. ?

## 2022-02-26 ENCOUNTER — Ambulatory Visit: Payer: BC Managed Care – PPO | Admitting: Nutrition

## 2022-02-26 ENCOUNTER — Inpatient Hospital Stay: Payer: BC Managed Care – PPO | Admitting: Nutrition

## 2022-02-26 ENCOUNTER — Inpatient Hospital Stay: Payer: BC Managed Care – PPO

## 2022-02-26 ENCOUNTER — Other Ambulatory Visit: Payer: Self-pay | Admitting: *Deleted

## 2022-02-26 VITALS — BP 129/86 | HR 79 | Temp 98.2°F | Resp 18

## 2022-02-26 DIAGNOSIS — Z932 Ileostomy status: Secondary | ICD-10-CM | POA: Diagnosis not present

## 2022-02-26 DIAGNOSIS — C20 Malignant neoplasm of rectum: Secondary | ICD-10-CM | POA: Diagnosis not present

## 2022-02-26 DIAGNOSIS — Z5189 Encounter for other specified aftercare: Secondary | ICD-10-CM | POA: Diagnosis not present

## 2022-02-26 DIAGNOSIS — Z5111 Encounter for antineoplastic chemotherapy: Secondary | ICD-10-CM | POA: Diagnosis not present

## 2022-02-26 DIAGNOSIS — Z95828 Presence of other vascular implants and grafts: Secondary | ICD-10-CM

## 2022-02-26 LAB — CMP (CANCER CENTER ONLY)
ALT: 37 U/L (ref 0–44)
AST: 27 U/L (ref 15–41)
Albumin: 4.5 g/dL (ref 3.5–5.0)
Alkaline Phosphatase: 126 U/L (ref 38–126)
Anion gap: 10 (ref 5–15)
BUN: 48 mg/dL — ABNORMAL HIGH (ref 6–20)
CO2: 23 mmol/L (ref 22–32)
Calcium: 9.7 mg/dL (ref 8.9–10.3)
Chloride: 95 mmol/L — ABNORMAL LOW (ref 98–111)
Creatinine: 2.63 mg/dL — ABNORMAL HIGH (ref 0.61–1.24)
GFR, Estimated: 27 mL/min — ABNORMAL LOW (ref >60)
Glucose, Bld: 110 mg/dL — ABNORMAL HIGH (ref 70–99)
Potassium: 3.9 mmol/L (ref 3.5–5.1)
Sodium: 128 mmol/L — ABNORMAL LOW (ref 135–145)
Total Bilirubin: 0.6 mg/dL (ref 0.3–1.2)
Total Protein: 7.4 g/dL (ref 6.5–8.1)

## 2022-02-26 LAB — MAGNESIUM: Magnesium: 1.8 mg/dL (ref 1.7–2.4)

## 2022-02-26 MED ORDER — HEPARIN SOD (PORK) LOCK FLUSH 100 UNIT/ML IV SOLN
500.0000 [IU] | Freq: Once | INTRAVENOUS | Status: AC
  Administered 2022-02-26: 500 [IU] via INTRAVENOUS

## 2022-02-26 MED ORDER — SODIUM CHLORIDE 0.9 % IV SOLN: INTRAVENOUS | Status: DC

## 2022-02-26 MED ORDER — SODIUM CHLORIDE 0.9% FLUSH
10.0000 mL | Freq: Once | INTRAVENOUS | Status: AC
  Administered 2022-02-26: 10 mL via INTRAVENOUS

## 2022-02-26 NOTE — Patient Instructions (Signed)

## 2022-02-26 NOTE — Patient Instructions (Signed)

## 2022-02-26 NOTE — Progress Notes (Signed)
Kenneth [name] reports 1000 cc ileostomy output since conversation yesterday w/wife and drinking 92 ounces fluid. Taking #3 Imodium/day. He reports feeling lightheaded going from sitting to standing. Explained how to sit, then stand and not ambulate until no longer lightheaded. ?Dr. [name] notified of above and lab results today. Needs to repeat CMP tomorrow and receive 1 liter NS again. High priority scheduling message sent. Increase Imodium to #4/day ?

## 2022-02-26 NOTE — Progress Notes (Signed)
61 year old male diagnosed with Rectal cancer S/P ileostomy on January 15, 2022. He is followed by Dr. [name]. He is here today for IVF secondary to watery, frequent stool output. ? ?PMH includes Gout and HTN. ? ?Medications include Zofran and Compazine. ? ?Labs include  ? ?Height: 5 ft, 11.5 inches ?Weight: 190 pounds. ?UBW: 257 pounds August 2022 and 233 pounds December 2022. ?BMI: 26.21. ? ?Patient has lost 26% of usual weight over 8 months which is significant. ?He has watery to thick stools and empties his ostomy bag 6 times daily with it partially full. ?Reports poor appetite and early satiety. ?Managed to consume 92 oz of fluid yesterday, which pleased him. ?States he is drinking water, Pedialyte and Gatorade. ? ?Nutrition Diagnosis: ?Unintended weight loss related to rectal cancer and associated treatments as evidenced by 26% wt loss over 8 months which is significant. ? ?Intervention: ?Encouraged increased calories and protein to minimize wt loss. ?Reviewed diet after Ileostomy fact sheet and provided a copy for patient. Explained how to reintroduce foods slowly and monitor tolerance. ?Continue increased fluid intake. ?Suggested adding protein shake by drinking 1/2 twice daily and assess tolerance. ?Questions answered and teach back method used. ? ?Monitoring, Evaluation, Goals: ?Patient will tolerate increased calories and protein to minimize further wt loss. ? ?Next Visit: ?Wednesday, April 19, by telephone. ?

## 2022-02-27 ENCOUNTER — Inpatient Hospital Stay: Payer: BC Managed Care – PPO

## 2022-02-27 VITALS — BP 131/87 | HR 67 | Temp 98.3°F | Resp 20

## 2022-02-27 DIAGNOSIS — C20 Malignant neoplasm of rectum: Secondary | ICD-10-CM

## 2022-02-27 DIAGNOSIS — Z5111 Encounter for antineoplastic chemotherapy: Secondary | ICD-10-CM | POA: Diagnosis not present

## 2022-02-27 DIAGNOSIS — Z5189 Encounter for other specified aftercare: Secondary | ICD-10-CM | POA: Diagnosis not present

## 2022-02-27 DIAGNOSIS — Z932 Ileostomy status: Secondary | ICD-10-CM | POA: Diagnosis not present

## 2022-02-27 DIAGNOSIS — Z95828 Presence of other vascular implants and grafts: Secondary | ICD-10-CM

## 2022-02-27 LAB — CMP (CANCER CENTER ONLY)
ALT: 32 U/L (ref 0–44)
AST: 24 U/L (ref 15–41)
Albumin: 4.3 g/dL (ref 3.5–5.0)
Alkaline Phosphatase: 119 U/L (ref 38–126)
Anion gap: 10 (ref 5–15)
BUN: 34 mg/dL — ABNORMAL HIGH (ref 6–20)
CO2: 23 mmol/L (ref 22–32)
Calcium: 9.5 mg/dL (ref 8.9–10.3)
Chloride: 98 mmol/L (ref 98–111)
Creatinine: 2.08 mg/dL — ABNORMAL HIGH (ref 0.61–1.24)
GFR, Estimated: 36 mL/min — ABNORMAL LOW (ref >60)
Glucose, Bld: 102 mg/dL — ABNORMAL HIGH (ref 70–99)
Potassium: 3.9 mmol/L (ref 3.5–5.1)
Sodium: 131 mmol/L — ABNORMAL LOW (ref 135–145)
Total Bilirubin: 0.4 mg/dL (ref 0.3–1.2)
Total Protein: 7 g/dL (ref 6.5–8.1)

## 2022-02-27 MED ORDER — HEPARIN SOD (PORK) LOCK FLUSH 100 UNIT/ML IV SOLN
500.0000 [IU] | Freq: Once | INTRAVENOUS | Status: AC
  Administered 2022-02-27: 500 [IU] via INTRAVENOUS

## 2022-02-27 MED ORDER — SODIUM CHLORIDE 0.9 % IV SOLN: INTRAVENOUS | Status: DC

## 2022-02-27 MED ORDER — SODIUM CHLORIDE 0.9% FLUSH
10.0000 mL | Freq: Once | INTRAVENOUS | Status: AC
  Administered 2022-02-27: 10 mL via INTRAVENOUS

## 2022-02-27 NOTE — Patient Instructions (Signed)

## 2022-02-28 ENCOUNTER — Ambulatory Visit (HOSPITAL_COMMUNITY)
Admission: RE | Admit: 2022-02-28 | Discharge: 2022-02-28 | Disposition: A | Discharge status: Home / Self Care | Payer: BC Managed Care – PPO | Source: Ambulatory Visit | Attending: Nurse Practitioner | Admitting: Nurse Practitioner

## 2022-02-28 ENCOUNTER — Telehealth: Payer: Self-pay

## 2022-02-28 DIAGNOSIS — C20 Malignant neoplasm of rectum: Secondary | ICD-10-CM | POA: Diagnosis not present

## 2022-02-28 DIAGNOSIS — Z432 Encounter for attention to ileostomy: Secondary | ICD-10-CM

## 2022-02-28 DIAGNOSIS — Z932 Ileostomy status: Secondary | ICD-10-CM | POA: Diagnosis not present

## 2022-02-28 NOTE — Telephone Encounter (Signed)
-----   Message from Ladell Pier, MD sent at 02/27/2022  4:49 PM EDT ----- ?Please call patient, chemistry panel looks better, be sure he is drinking adequate fluids, return for IV fluids and repeat BMP 02/28/2022 ? ?

## 2022-02-28 NOTE — Progress Notes (Signed)
[facility]  ? ?Reason for visit:  ?RLQ ileostomy ?HPI:  ?Rectal cancer with ileostomy ?ROS  ?Review of Systems  ?Gastrointestinal:   ?     RLQ ileostomy  ?All other systems reviewed and are negative. ?Vital signs:  ?BP (!) 138/91 (BP Location: Right Arm)   Pulse 85   Temp 97.9 ?F (36.6 ?C) (Oral)   Resp 18   SpO2 98%  ?Exam:  ?Physical Exam ?Constitutional:   ?   Appearance: Normal appearance.  ?Abdominal:  ?   Palpations: Abdomen is soft.  ?Skin: ?   General: Skin is warm and dry.  ?Neurological:  ?   Mental Status: He is alert.  ?Psychiatric:     ?   Mood and Affect: Mood normal.  ?  ?Stoma type/location:  RLQ ileostomy ?Stomal assessment/size:  1 1/8" pink and moist slightly budded ?Peristomal assessment: intact  ?Treatment options for stomal/peristomal skin: Experiencing leaks with hollister pouch.  Would like other options. We implement a two piece COloplast pouch today.   ?Output: Liquid brown stool ?Ostomy pouching: 2pc. Coloplast with belt  barrier ring skin prep and powder ?Education provided:  we perform pouch change.  Wife at bedside and performs most of care.  ? ?  ?Impression/dx  ?Ileostomy, high output ?Pouching leaks ?Discussion  ?Try coloplast and let me know if that is the option you like.  I will  change order with [employer] [facility] ?Plan  ?Call office.  ? ? ? ?Visit time: 55 minutes.  ? ?Domenic Moras FNP-BC ? ?  ?

## 2022-02-28 NOTE — Telephone Encounter (Signed)
TC to Pt spoke with Pt's wife with Pt in the background. Inquired how Pt was feeling and Asked if he increased his fluid intake. Pt's wife stated Pt was doing well and didn't need to come in for additional fluids. Pt wife inquired if there was any concerns over the weekend. Informed her she could call the On call Nurse line. Pt's wife verbalized understanding. No further problems or concerns noted. ?

## 2022-03-04 ENCOUNTER — Other Ambulatory Visit: Payer: Self-pay

## 2022-03-04 ENCOUNTER — Encounter: Payer: Self-pay | Admitting: Nurse Practitioner

## 2022-03-04 ENCOUNTER — Other Ambulatory Visit: Payer: Self-pay | Admitting: Oncology

## 2022-03-04 ENCOUNTER — Inpatient Hospital Stay: Payer: BC Managed Care – PPO

## 2022-03-04 ENCOUNTER — Inpatient Hospital Stay: Payer: BC Managed Care – PPO | Admitting: Nurse Practitioner

## 2022-03-04 ENCOUNTER — Telehealth: Payer: Self-pay

## 2022-03-04 ENCOUNTER — Other Ambulatory Visit: Payer: Self-pay | Admitting: Nurse Practitioner

## 2022-03-04 ENCOUNTER — Encounter: Payer: Self-pay | Admitting: *Deleted

## 2022-03-04 VITALS — BP 133/90 | HR 77 | Temp 98.2°F | Resp 18 | Ht 71.0 in | Wt 194.8 lb

## 2022-03-04 DIAGNOSIS — C20 Malignant neoplasm of rectum: Secondary | ICD-10-CM

## 2022-03-04 DIAGNOSIS — Z5189 Encounter for other specified aftercare: Secondary | ICD-10-CM | POA: Diagnosis not present

## 2022-03-04 DIAGNOSIS — Z5111 Encounter for antineoplastic chemotherapy: Secondary | ICD-10-CM | POA: Diagnosis not present

## 2022-03-04 DIAGNOSIS — Z932 Ileostomy status: Secondary | ICD-10-CM | POA: Diagnosis not present

## 2022-03-04 LAB — CMP (CANCER CENTER ONLY)
ALT: 23 U/L (ref 0–44)
AST: 21 U/L (ref 15–41)
Albumin: 4.1 g/dL (ref 3.5–5.0)
Alkaline Phosphatase: 103 U/L (ref 38–126)
Anion gap: 9 (ref 5–15)
BUN: 21 mg/dL — ABNORMAL HIGH (ref 6–20)
CO2: 30 mmol/L (ref 22–32)
Calcium: 9.9 mg/dL (ref 8.9–10.3)
Chloride: 96 mmol/L — ABNORMAL LOW (ref 98–111)
Creatinine: 1.86 mg/dL — ABNORMAL HIGH (ref 0.61–1.24)
GFR, Estimated: 41 mL/min — ABNORMAL LOW (ref >60)
Glucose, Bld: 103 mg/dL — ABNORMAL HIGH (ref 70–99)
Potassium: 3.7 mmol/L (ref 3.5–5.1)
Sodium: 135 mmol/L (ref 135–145)
Total Bilirubin: 0.4 mg/dL (ref 0.3–1.2)
Total Protein: 6.7 g/dL (ref 6.5–8.1)

## 2022-03-04 LAB — CBC WITH DIFFERENTIAL (CANCER CENTER ONLY)
Abs Immature Granulocytes: 0.01 10*3/uL (ref 0.00–0.07)
Basophils Absolute: 0 10*3/uL (ref 0.0–0.1)
Basophils Relative: 1 %
Eosinophils Absolute: 0.2 10*3/uL (ref 0.0–0.5)
Eosinophils Relative: 4 %
HCT: 39.8 % (ref 39.0–52.0)
Hemoglobin: 13.2 g/dL (ref 13.0–17.0)
Immature Granulocytes: 0 %
Lymphocytes Relative: 42 %
Lymphs Abs: 1.7 10*3/uL (ref 0.7–4.0)
MCH: 25 pg — ABNORMAL LOW (ref 26.0–34.0)
MCHC: 33.2 g/dL (ref 30.0–36.0)
MCV: 75.5 fL — ABNORMAL LOW (ref 80.0–100.0)
Monocytes Absolute: 0.5 10*3/uL (ref 0.1–1.0)
Monocytes Relative: 13 %
Neutro Abs: 1.7 10*3/uL (ref 1.7–7.7)
Neutrophils Relative %: 40 %
Platelet Count: 154 10*3/uL (ref 150–400)
RBC: 5.27 MIL/uL (ref 4.22–5.81)
RDW: 20.3 % — ABNORMAL HIGH (ref 11.5–15.5)
WBC Count: 4.1 10*3/uL (ref 4.0–10.5)
nRBC: 0 % (ref 0.0–0.2)

## 2022-03-04 MED ORDER — FLUOROURACIL CHEMO INJECTION 2.5 GM/50ML
400.0000 mg/m2 | Freq: Once | INTRAVENOUS | Status: AC
  Administered 2022-03-04: 850 mg via INTRAVENOUS
  Filled 2022-03-04: qty 17

## 2022-03-04 MED ORDER — SODIUM CHLORIDE 0.9 % IV SOLN
5000.0000 mg | INTRAVENOUS | Status: DC
  Administered 2022-03-04: 5000 mg via INTRAVENOUS
  Filled 2022-03-04: qty 100

## 2022-03-04 MED ORDER — PROCHLORPERAZINE MALEATE 10 MG PO TABS: 10.0000 mg | ORAL_TABLET | Freq: Four times a day (QID) | ORAL | 0 refills | Status: DC | PRN

## 2022-03-04 MED ORDER — LEUCOVORIN CALCIUM INJECTION 350 MG
400.0000 mg/m2 | Freq: Once | INTRAVENOUS | Status: AC
  Administered 2022-03-04: 840 mg via INTRAVENOUS
  Filled 2022-03-04: qty 42

## 2022-03-04 MED ORDER — SODIUM CHLORIDE 0.9 % IV SOLN
10.0000 mg | Freq: Once | INTRAVENOUS | Status: AC
  Administered 2022-03-04: 10 mg via INTRAVENOUS
  Filled 2022-03-04: qty 1

## 2022-03-04 MED ORDER — DEXTROSE 5 % IV SOLN: Freq: Once | INTRAVENOUS | Status: AC

## 2022-03-04 MED ORDER — OXALIPLATIN CHEMO INJECTION 100 MG/20ML
85.0000 mg/m2 | Freq: Once | INTRAVENOUS | Status: AC
  Administered 2022-03-04: 180 mg via INTRAVENOUS
  Filled 2022-03-04: qty 36

## 2022-03-04 MED ORDER — PALONOSETRON HCL INJECTION 0.25 MG/5ML
0.2500 mg | Freq: Once | INTRAVENOUS | Status: AC
  Administered 2022-03-04: 0.25 mg via INTRAVENOUS
  Filled 2022-03-04: qty 5

## 2022-03-04 NOTE — Discharge Instructions (Signed)
Call office and let me know if you want to switch to coloplast ?

## 2022-03-04 NOTE — Progress Notes (Signed)
Patient seen by [name] NP today ? ?Vitals are within treatment parameters. ? ?Labs reviewed by [name] NP and are not all within treatment parameters. Creatinine still elevated at 1.86 --proceed ? ?Per physician team, patient is ready for treatment. Please note that modifications are being made to the treatment plan including Dose reduction of oxaliplatin due to elevated creatinine.   ?

## 2022-03-04 NOTE — Progress Notes (Signed)
Patient presents for treatment. RN assessment completed along with the following: ? ?Labs/vitals reviewed - Labs reviewed by [name] NP and are not all within treatment parameters. Creatinine still elevated at 1.86 --proceed   ?Weight within 10% of previous measurement - Yes ?Informed consent completed and reflects current therapy/intent - Yes, on date 02/17/22             ?Provider progress note reviewed - Yes, today's provider note was reviewed. ?Treatment/Antibody/Supportive plan reviewed - Yes, and Oxaliplatin dose reduced ?S&H and other orders reviewed - Yes, and there are no additional orders identified. ?Previous treatment date reviewed - Yes, and the appropriate amount of time has elapsed between treatments. ?Clinic Hand Off Received from - Cristy Friedlander, RN ? ?Patient to proceed with treatment.  ? ?

## 2022-03-04 NOTE — Patient Instructions (Addendum)
[facility]  ?The chemotherapy medication bag should finish at 46 hours, 96 hours, or 7 days. For example, if your pump is scheduled for 46 hours and it was put on at 4:00 p.m., it should finish at 2:00 p.m. the day it is scheduled to come off regardless of your appointment time.   ?  ?Estimated time to finish at 11:30 Thursday 03/06/22. ?  ?If the display on your pump reads "Low Volume" and it is beeping, take the batteries out of the pump and come to the cancer center for it to be taken off.  ? ?If the pump alarms go off prior to the pump reading "Low Volume" then call [phone] and someone can assist you. ? ?If the plunger comes out and the chemotherapy medication is leaking out, please use your home chemo spill kit to clean up the spill. Do NOT use paper towels or other household products. ? ?If you have problems or questions regarding your pump, please call either [phone] (24 hours a day) or the cancer center Monday-Friday 8:00 a.m.- 4:30 p.m. at the clinic number and we will assist you. If you are unable to get assistance, then go to the nearest Emergency Department and ask the staff to contact the IV team for assistance.  ? Discharge Instructions: ?Thank you for choosing [facility] to provide your oncology and hematology care.  ? ?If you have a lab appointment with the [facility], please go directly to the [facility] and check in at the registration area. ?  ?Wear comfortable clothing and clothing appropriate for easy access to any Portacath or PICC line.  ? ?We strive to give you quality time with your provider. You may need to reschedule your appointment if you arrive late (15 or more minutes).  Arriving late affects you and other patients whose appointments are after yours.  Also, if you miss three or more appointments without notifying the office, you may be dismissed from the clinic at the provider?s discretion.    ?  ?For prescription refill  requests, have your pharmacy contact our office and allow 72 hours for refills to be completed.   ? ?Today you received the following chemotherapy and/or immunotherapy agents Oxaliplatin, Levcovorin, Fluorouracil.    ?  ?To help prevent nausea and vomiting after your treatment, we encourage you to take your nausea medication as directed. ? ?BELOW ARE SYMPTOMS THAT SHOULD BE REPORTED IMMEDIATELY: ?*FEVER GREATER THAN 100.4 F (38 ?C) OR HIGHER ?*CHILLS OR SWEATING ?*NAUSEA AND VOMITING THAT IS NOT CONTROLLED WITH YOUR NAUSEA MEDICATION ?*UNUSUAL SHORTNESS OF BREATH ?*UNUSUAL BRUISING OR BLEEDING ?*URINARY PROBLEMS (pain or burning when urinating, or frequent urination) ?*BOWEL PROBLEMS (unusual diarrhea, constipation, pain near the anus) ?TENDERNESS IN MOUTH AND THROAT WITH OR WITHOUT PRESENCE OF ULCERS (sore throat, sores in mouth, or a toothache) ?UNUSUAL RASH, SWELLING OR PAIN  ?UNUSUAL VAGINAL DISCHARGE OR ITCHING  ? ?Items with * indicate a potential emergency and should be followed up as soon as possible or go to the Emergency Department if any problems should occur. ? ?Please show the CHEMOTHERAPY ALERT CARD or IMMUNOTHERAPY ALERT CARD at check-in to the Emergency Department and triage nurse. ? ?Should you have questions after your visit or need to cancel or reschedule your appointment, please contact [facility]  Dept: [phone]  and follow the prompts.  Office hours are 8:00 a.m. to 4:30 p.m. Monday - Friday. Please note that voicemails left after  4:00 p.m. may not be returned until the following business day.  We are closed weekends and major holidays. You have access to a nurse at all times for urgent questions. Please call the main number to the clinic Dept: [phone] and follow the prompts. ? ? ?For any non-urgent questions, you may also contact your provider using MyChart. We now offer e-Visits for anyone 61 and older to request care online for non-urgent symptoms. For  details visit [email or web address]. ?  ?Also download the MyChart app! Go to the app store, search "MyChart", open the app, select [facility], and log in with your MyChart username and password. ? ?Due to Covid, a mask is required upon entering the hospital/clinic. If you do not have a mask, one will be given to you upon arrival. For doctor visits, patients may have 1 support person aged 102 or older with them. For treatment visits, patients cannot have anyone with them due to current Covid guidelines and our immunocompromised population.  ?Oxaliplatin Injection ?What is this medication? ?OXALIPLATIN (ox AL i PLA tin) is a chemotherapy drug. It targets fast dividing cells, like cancer cells, and causes these cells to die. This medicine is used to treat cancers of the colon and rectum, and many other cancers. ?This medicine may be used for other purposes; ask your health care provider or pharmacist if you have questions. ?COMMON BRAND NAME(S): Eloxatin ?What should I tell my care team before I take this medication? ?They need to know if you have any of these conditions: ?heart disease ?history of irregular heartbeat ?liver disease ?low blood counts, like white cells, platelets, or red blood cells ?lung or breathing disease, like asthma ?take medicines that treat or prevent blood clots ?tingling of the fingers or toes, or other nerve disorder ?an unusual or allergic reaction to oxaliplatin, other chemotherapy, other medicines, foods, dyes, or preservatives ?pregnant or trying to get pregnant ?breast-feeding ?How should I use this medication? ?This drug is given as an infusion into a vein. It is administered in a hospital or clinic by a specially trained health care professional. ?Talk to your pediatrician regarding the use of this medicine in children. Special care may be needed. ?Overdosage: If you think you have taken too much of this medicine contact a poison control center or emergency room at once. ?NOTE: This  medicine is only for you. Do not share this medicine with others. ?What if I miss a dose? ?It is important not to miss a dose. Call your doctor or health care professional if you are unable to keep an appointment. ?What may interact with this medication? ?Do not take this medicine with any of the following medications: ?cisapride ?dronedarone ?pimozide ?thioridazine ?This medicine may also interact with the following medications: ?aspirin and aspirin-like medicines ?certain medicines that treat or prevent blood clots like warfarin, apixaban, dabigatran, and rivaroxaban ?cisplatin ?cyclosporine ?diuretics ?medicines for infection like acyclovir, adefovir, amphotericin B, bacitracin, cidofovir, foscarnet, ganciclovir, gentamicin, pentamidine, vancomycin ?NSAIDs, medicines for pain and inflammation, like ibuprofen or naproxen ?other medicines that prolong the QT interval (an abnormal heart rhythm) ?pamidronate ?zoledronic acid ?This list may not describe all possible interactions. Give your health care provider a list of all the medicines, herbs, non-prescription drugs, or dietary supplements you use. Also tell them if you smoke, drink alcohol, or use illegal drugs. Some items may interact with your medicine. ?What should I watch for while using this medication? ?Your condition will be monitored carefully while you are receiving this medicine. ?You may  need blood work done while you are taking this medicine. ?This medicine may make you feel generally unwell. This is not uncommon as chemotherapy can affect healthy cells as well as cancer cells. Report any side effects. Continue your course of treatment even though you feel ill unless your healthcare professional tells you to stop. ?This medicine can make you more sensitive to cold. Do not drink cold drinks or use ice. Cover exposed skin before coming in contact with cold temperatures or cold objects. When out in cold weather wear warm clothing and cover your mouth and  nose to warm the air that goes into your lungs. Tell your doctor if you get sensitive to the cold. ?Do not become pregnant while taking this medicine or for 9 months after stopping it. Women should inform

## 2022-03-04 NOTE — Telephone Encounter (Signed)
Called the patient to confirmed his pharmacy and refill his compazine   ?

## 2022-03-04 NOTE — Progress Notes (Signed)
?  [facility] ?OFFICE PROGRESS NOTE ? ? ?Diagnosis:  Rectal cancer ? ?INTERVAL HISTORY:  ? ?Kenneth [name] returns as scheduled.  He completed cycle 1 FOLFOX 02/17/2022.  He developed increased watery output from the ileostomy after chemotherapy.  Oral intake was poor.  He received IV fluids 02/26/2022 and 02/27/2022.  He felt better after the IV fluids.  He is now taking Imodium 4 times a day, notes improvement in output.  He estimates drinking 74 ounces of fluid a day.  He did not have nausea/vomiting.  He developed a few small sores on the lower lip.  Cold sensitivity lasted 4 to 5 days. ? ?Objective: ? ?Vital signs in last 24 hours: ? ?Blood pressure 133/90, pulse 77, temperature 98.2 ?F (36.8 ?C), temperature source Oral, resp. rate 18, height $RemoveBe'5\' 11"'wEGmRSpWb$  (1.803 m), weight 194 lb 12.8 oz (88.4 kg), SpO2 100 %. ?  ? ?HEENT: No thrush or ulcers. ?Resp: Distant breath sounds, scattered wheezes.  No respiratory distress. ?Cardio: Regular rate and rhythm. ?GI: Abdomen soft and nontender.  No hepatomegaly.  Ileostomy with thick stool in the collection bag. ?Vascular: No leg edema. ?Skin: Palms without erythema. ?Port-A-Cath without erythema. ? ? ?Lab Results: ? ?Lab Results  ?Component Value Date  ? WBC 4.1 03/04/2022  ? HGB 13.2 03/04/2022  ? HCT 39.8 03/04/2022  ? MCV 75.5 (L) 03/04/2022  ? PLT 154 03/04/2022  ? NEUTROABS 1.7 03/04/2022  ? ? ?Imaging: ? ?No results found. ? ?Medications: I have reviewed the patient's current medications. ? ?Assessment/Plan: ?Rectal cancer-mass at 11 cm on proctoscopy by Dr. Morton Stall 02/04/2022 ?Colonoscopy 12/26/2021-obstructing mass at the rectosigmoid measured at 15 cm, biopsy invasive moderate to poorly differentiated adenocarcinoma, mismatch repair protein expression intact ?CTs 12/31/2021-masslike circumferential thickening of the distal sigmoid colon with mildly enlarged pericolonic lymph nodes, numerous subcentimeter hypodense liver lesions, splenomegaly ?MRI abdomen  01/07/2022-innumerable T2 hyperintense liver lesions consistent with cysts, spleen unremarkable, no ascites or adenopathy, moderate colonic fecal retention, no bowel obstruction ?MRI pelvis 01/22/2022-T3cN2 tumor above and below the peritoneal reflection, tumor 11.3 cm from the anal verge and 6.1 cm from the sphincter complex, multiple enlarged perirectal nodes, 1.1 cm node versus tumor deposit at the right aspect of the tumor ?Diverting loop ileostomy 01/15/2022 ?Cycle 1 FOLFOX 02/17/2022 ?Gout ?Psoriasis ?Hypertension ?Left scalene node versus cyst/lipoma on exam 02/03/2022-CT neck 02/10/2022 with 9.5 mm lymph node in the left supraclavicular region posterior to the sternocleidomastoid muscle.  No other prominent lymph nodes in the neck.  Biopsy 02/12/2022 compatible with benign lymph node, negative for metastatic carcinoma. ? ?Disposition: Kenneth [name] appears stable.  He has completed 1 cycle of FOLFOX.  He developed increased ostomy output following the chemotherapy, required IV fluids on 2 days.  He reports adequate fluid intake at today's visit and thicker stool in the collection bag since taking scheduled Imodium.  He will continue the same.   ? ?CBC from today reviewed.  Counts adequate to proceed with treatment.  Chemistry panel from today reviewed.  Labs adequate to proceed with treatment.  Creatinine is better but remains elevated.  Plan to proceed with cycle 2 FOLFOX today as scheduled. ? ?He will return for lab, follow-up, cycle 3 FOLFOX in 2 weeks.  He understands to contact the office in the interim with poorly controlled diarrhea and/or inadequate fluid intake. ? ?Plan reviewed with Dr. [name]. ? ? ? ?[name] ANP/GNP-BC  ? ?03/04/2022  ?8:43 AM ? ? ? ? ? ? ? ?

## 2022-03-05 ENCOUNTER — Telehealth: Payer: Self-pay | Admitting: Medical Oncology

## 2022-03-05 ENCOUNTER — Telehealth: Payer: Self-pay | Admitting: Nutrition

## 2022-03-05 ENCOUNTER — Inpatient Hospital Stay: Payer: BC Managed Care – PPO | Admitting: Nutrition

## 2022-03-05 NOTE — Telephone Encounter (Signed)
[employer] 2021-05 - Specimen Collection Study to Evaluate Biomarkers in Subjects with Cancer  ? ?Outgoing call: 14:11 ? ?Call to patient due to one question missed regarding cigar smoking history. I spoke with spouse and she confirmed that Kenneth [name] has a 5 year history of smoking 3 (small) cigars per day. For clarification purposes, which was not documented in the consent encounter on 02/17/22, patient had earlier reported (during consent signing) to have never smoked a pipe or chewed tobacco.  ?Spouse thanked for her time  ? ?Maxwell Marion, RN, BSN, [place] ?Clinical Research Nurse Lead ?03/05/2022 2:19 PM ? ?

## 2022-03-05 NOTE — Telephone Encounter (Signed)
Brief telephone follow up with patient's wife. Patient sleeping soundly after cycle 2 infusion of FOLFOX yesterday for Rectal cancer s/p ileostomy March 1. ? ?Wt improved to 194 pounds 12.8 oz from 190 pounds. ? ?Reviewed labs on 4/23: Glucose 103, BUN 21 and Creatinine 1.86. ? ?Patient felt better after fluids last cycle and has been trying to drink more water this time around. ?He has improved ostomy output with imodium and wife reports patient is "playing around with the dosages". ?He tried protein shakes again but they just don't agree with him. ?Denies Nausea and Vomiting. ?Noted sores on lips. ? ?Nutrition Diagnosis:  ?Unintended wt loss improved. ? ?Intervention: ?Encouraged small frequent meals/snacks with adequate calories and protein. ?Suggested protein drink options. ?Encouraged continued increased fluid intake. ? ?Monitoring, Evaluation, Goals: ?Patient will tolerate increased calories and protein to maintain wt. ? ?Next Visit: To be scheduled as needed. Wife agrees to contact RD for questions or concerns. ? ? ? ?

## 2022-03-06 ENCOUNTER — Encounter: Payer: Self-pay | Admitting: Licensed Clinical Social Worker

## 2022-03-06 ENCOUNTER — Other Ambulatory Visit (HOSPITAL_COMMUNITY): Payer: Self-pay | Admitting: Nurse Practitioner

## 2022-03-06 ENCOUNTER — Inpatient Hospital Stay: Payer: BC Managed Care – PPO

## 2022-03-06 VITALS — BP 114/85 | HR 79 | Temp 97.7°F | Resp 20

## 2022-03-06 DIAGNOSIS — C20 Malignant neoplasm of rectum: Secondary | ICD-10-CM

## 2022-03-06 DIAGNOSIS — Z932 Ileostomy status: Secondary | ICD-10-CM | POA: Diagnosis not present

## 2022-03-06 DIAGNOSIS — Z432 Encounter for attention to ileostomy: Secondary | ICD-10-CM

## 2022-03-06 DIAGNOSIS — Z5189 Encounter for other specified aftercare: Secondary | ICD-10-CM | POA: Diagnosis not present

## 2022-03-06 DIAGNOSIS — Z5111 Encounter for antineoplastic chemotherapy: Secondary | ICD-10-CM | POA: Diagnosis not present

## 2022-03-06 MED ORDER — HEPARIN SOD (PORK) LOCK FLUSH 100 UNIT/ML IV SOLN
500.0000 [IU] | Freq: Once | INTRAVENOUS | Status: AC | PRN
  Administered 2022-03-06: 500 [IU]

## 2022-03-06 MED ORDER — SODIUM CHLORIDE 0.9% FLUSH
10.0000 mL | INTRAVENOUS | Status: DC | PRN
  Administered 2022-03-06: 10 mL

## 2022-03-06 NOTE — Patient Instructions (Signed)

## 2022-03-06 NOTE — Progress Notes (Signed)
[facility] CSW Progress Note ? ?Clinical Psychiatrist consent form to patient's primary care giver [name]  to [name]. ? ? ? ?Kenneth [name] , LCSW ?

## 2022-03-11 ENCOUNTER — Other Ambulatory Visit: Payer: Self-pay

## 2022-03-11 MED ORDER — MAGIC MOUTHWASH: 5.0000 mL | Freq: Four times a day (QID) | ORAL | 0 refills | Status: DC | PRN

## 2022-03-12 DIAGNOSIS — Z932 Ileostomy status: Secondary | ICD-10-CM | POA: Diagnosis not present

## 2022-03-14 ENCOUNTER — Inpatient Hospital Stay: Payer: BC Managed Care – PPO

## 2022-03-14 ENCOUNTER — Other Ambulatory Visit: Payer: Self-pay

## 2022-03-14 ENCOUNTER — Telehealth: Payer: Self-pay

## 2022-03-14 VITALS — BP 113/89 | HR 67 | Temp 97.6°F | Resp 18

## 2022-03-14 DIAGNOSIS — Z95828 Presence of other vascular implants and grafts: Secondary | ICD-10-CM

## 2022-03-14 DIAGNOSIS — Z5189 Encounter for other specified aftercare: Secondary | ICD-10-CM | POA: Diagnosis not present

## 2022-03-14 DIAGNOSIS — Z932 Ileostomy status: Secondary | ICD-10-CM | POA: Diagnosis not present

## 2022-03-14 DIAGNOSIS — Z5111 Encounter for antineoplastic chemotherapy: Secondary | ICD-10-CM | POA: Diagnosis not present

## 2022-03-14 DIAGNOSIS — C20 Malignant neoplasm of rectum: Secondary | ICD-10-CM

## 2022-03-14 LAB — CMP (CANCER CENTER ONLY)
ALT: 19 U/L (ref 0–44)
AST: 22 U/L (ref 15–41)
Albumin: 4.3 g/dL (ref 3.5–5.0)
Alkaline Phosphatase: 87 U/L (ref 38–126)
Anion gap: 10 (ref 5–15)
BUN: 18 mg/dL (ref 6–20)
CO2: 29 mmol/L (ref 22–32)
Calcium: 10.3 mg/dL (ref 8.9–10.3)
Chloride: 97 mmol/L — ABNORMAL LOW (ref 98–111)
Creatinine: 1.54 mg/dL — ABNORMAL HIGH (ref 0.61–1.24)
GFR, Estimated: 51 mL/min — ABNORMAL LOW (ref >60)
Glucose, Bld: 103 mg/dL — ABNORMAL HIGH (ref 70–99)
Potassium: 3.7 mmol/L (ref 3.5–5.1)
Sodium: 136 mmol/L (ref 135–145)
Total Bilirubin: 0.5 mg/dL (ref 0.3–1.2)
Total Protein: 7.2 g/dL (ref 6.5–8.1)

## 2022-03-14 LAB — MAGNESIUM: Magnesium: 1.8 mg/dL (ref 1.7–2.4)

## 2022-03-14 MED ORDER — SODIUM CHLORIDE 0.9 % IV SOLN: INTRAVENOUS | Status: DC

## 2022-03-14 MED ORDER — SODIUM CHLORIDE 0.9% FLUSH
10.0000 mL | Freq: Once | INTRAVENOUS | Status: AC
  Administered 2022-03-14: 10 mL via INTRAVENOUS

## 2022-03-14 MED ORDER — HEPARIN SOD (PORK) LOCK FLUSH 100 UNIT/ML IV SOLN
500.0000 [IU] | Freq: Once | INTRAVENOUS | Status: AC
  Administered 2022-03-14: 500 [IU] via INTRAVENOUS

## 2022-03-14 NOTE — Telephone Encounter (Signed)
Patient's wife called in stated the patient is getting weak, lost 6lbs in last couple days. He have a headache and his ostomy is putting out large amount and he had to take a lomotil. He feel like he needs fluid. I spoke with Dr.[facility]. Per to Dr. [name], we can have him to come for liter of fluid. He is schedule to come in at 1200. I called the patient and let him know he is schedule for 12. Patient gave verbal understanding ?

## 2022-03-14 NOTE — Patient Instructions (Signed)
Rehydration, Adult Rehydration is the replacement of body fluids, salts, and minerals (electrolytes) that are lost during dehydration. Dehydration is when there is not enough water or other fluids in the body. This happens when you lose more fluids than you take in. Common causes of dehydration include: Not drinking enough fluids. This can occur when you are ill or doing activities that require a lot of energy, especially in hot weather. Conditions that cause loss of water or other fluids, such as diarrhea, vomiting, sweating, or urinating a lot. Other illnesses, such as fever or infection. Certain medicines, such as those that remove excess fluid from the body (diuretics). Symptoms of mild or moderate dehydration may include thirst, dry lips and mouth, and dizziness. Symptoms of severe dehydration may include increased heart rate, confusion, fainting, and not urinating. For severe dehydration, you may need to get fluids through an IV at the hospital. For mild or moderate dehydration, you can usually rehydrate at home by drinking certain fluids as told by your health care provider. What are the risks? Generally, rehydration is safe. However, taking in too much fluid (overhydration) can be a problem. This is rare. Overhydration can cause an electrolyte imbalance, kidney failure, or a decrease in salt (sodium) levels in the body. Supplies needed You will need an oral rehydration solution (ORS) if your health care provider tells you to use one. This is a drink to treat dehydration. It can be found in pharmacies and retail stores. How to rehydrate Fluids Follow instructions from your health care provider for rehydration. The kind of fluid and the amount you should drink depend on your condition. In general, you should choose drinks that you prefer. If told by your health care provider, drink an ORS. Make an ORS by following instructions on the package. Start by drinking small amounts, about  cup (120  mL) every 5-10 minutes. Slowly increase how much you drink until you have taken the amount recommended by your health care provider. Drink enough clear fluids to keep your urine pale yellow. If you were told to drink an ORS, finish it first, then start slowly drinking other clear fluids. Drink fluids such as: Water. This includes sparkling water and flavored water. Drinking only water can lead to having too little sodium in your body (hyponatremia). Follow the advice of your health care provider. Water from ice chips you suck on. Fruit juice with water you add to it (diluted). Sports drinks. Hot or cold herbal teas. Broth-based soups. Milk or milk products. Food Follow instructions from your health care provider about what to eat while you rehydrate. Your health care provider may recommend that you slowly begin eating regular foods in small amounts. Eat foods that contain a healthy balance of electrolytes, such as bananas, oranges, potatoes, tomatoes, and spinach. Avoid foods that are greasy or contain a lot of sugar. In some cases, you may get nutrition through a feeding tube that is passed through your nose and into your stomach (nasogastric tube, or NG tube). This may be done if you have uncontrolled vomiting or diarrhea. Beverages to avoid  Certain beverages may make dehydration worse. While you rehydrate, avoid drinking alcohol. How to tell if you are recovering from dehydration You may be recovering from dehydration if: You are urinating more often than before you started rehydrating. Your urine is pale yellow. Your energy level improves. You vomit less frequently. You have diarrhea less frequently. Your appetite improves or returns to normal. You feel less dizzy or less light-headed.   Your skin tone and color start to look more normal. Follow these instructions at home: Take over-the-counter and prescription medicines only as told by your health care provider. Do not take sodium  tablets. Doing this can lead to having too much sodium in your body (hypernatremia). Contact a health care provider if: You continue to have symptoms of mild or moderate dehydration, such as: Thirst. Dry lips. Slightly dry mouth. Dizziness. Dark urine or less urine than normal. Muscle cramps. You continue to vomit or have diarrhea. Get help right away if you: Have symptoms of dehydration that get worse. Have a fever. Have a severe headache. Have been vomiting and the following happens: Your vomiting gets worse or does not go away. Your vomit includes blood or green matter (bile). You cannot eat or drink without vomiting. Have problems with urination or bowel movements, such as: Diarrhea that gets worse or does not go away. Blood in your stool (feces). This may cause stool to look black and tarry. Not urinating, or urinating only a small amount of very dark urine, within 6-8 hours. Have trouble breathing. Have symptoms that get worse with treatment. These symptoms may represent a serious problem that is an emergency. Do not wait to see if the symptoms will go away. Get medical help right away. Call your local emergency services (911 in the [place] Do not drive yourself to the hospital. Summary Rehydration is the replacement of body fluids and minerals (electrolytes) that are lost during dehydration. Follow instructions from your health care provider for rehydration. The kind of fluid and amount you should drink depend on your condition. Slowly increase how much you drink until you have taken the amount recommended by your health care provider. Contact your health care provider if you continue to show signs of mild or moderate dehydration. This information is not intended to replace advice given to you by your health care provider. Make sure you discuss any questions you have with your health care provider. Document Revised: 01/04/2020 Document Reviewed: 11/14/2019 Elsevier Patient  Education  2023 [employer]  

## 2022-03-16 ENCOUNTER — Other Ambulatory Visit: Payer: Self-pay | Admitting: Oncology

## 2022-03-17 ENCOUNTER — Encounter: Payer: Self-pay | Admitting: *Deleted

## 2022-03-17 ENCOUNTER — Inpatient Hospital Stay: Payer: BC Managed Care – PPO

## 2022-03-17 ENCOUNTER — Inpatient Hospital Stay: Payer: BC Managed Care – PPO | Admitting: Nurse Practitioner

## 2022-03-17 ENCOUNTER — Inpatient Hospital Stay: Payer: BC Managed Care – PPO | Attending: Nurse Practitioner

## 2022-03-17 ENCOUNTER — Encounter: Payer: Self-pay | Admitting: Nurse Practitioner

## 2022-03-17 VITALS — BP 129/79 | HR 99 | Temp 98.2°F | Resp 18 | Ht 71.0 in | Wt 190.8 lb

## 2022-03-17 DIAGNOSIS — C20 Malignant neoplasm of rectum: Secondary | ICD-10-CM

## 2022-03-17 DIAGNOSIS — Z923 Personal history of irradiation: Secondary | ICD-10-CM | POA: Diagnosis not present

## 2022-03-17 DIAGNOSIS — E871 Hypo-osmolality and hyponatremia: Secondary | ICD-10-CM

## 2022-03-17 DIAGNOSIS — L409 Psoriasis, unspecified: Secondary | ICD-10-CM | POA: Diagnosis not present

## 2022-03-17 DIAGNOSIS — M109 Gout, unspecified: Secondary | ICD-10-CM | POA: Insufficient documentation

## 2022-03-17 DIAGNOSIS — D701 Agranulocytosis secondary to cancer chemotherapy: Secondary | ICD-10-CM | POA: Diagnosis not present

## 2022-03-17 DIAGNOSIS — T451X5A Adverse effect of antineoplastic and immunosuppressive drugs, initial encounter: Secondary | ICD-10-CM | POA: Insufficient documentation

## 2022-03-17 DIAGNOSIS — E860 Dehydration: Secondary | ICD-10-CM | POA: Diagnosis not present

## 2022-03-17 DIAGNOSIS — Z5111 Encounter for antineoplastic chemotherapy: Secondary | ICD-10-CM | POA: Insufficient documentation

## 2022-03-17 DIAGNOSIS — R531 Weakness: Secondary | ICD-10-CM | POA: Diagnosis not present

## 2022-03-17 DIAGNOSIS — Z5189 Encounter for other specified aftercare: Secondary | ICD-10-CM | POA: Diagnosis not present

## 2022-03-17 DIAGNOSIS — I10 Essential (primary) hypertension: Secondary | ICD-10-CM | POA: Insufficient documentation

## 2022-03-17 LAB — CBC WITH DIFFERENTIAL (CANCER CENTER ONLY)
Abs Immature Granulocytes: 0 10*3/uL (ref 0.00–0.07)
Basophils Absolute: 0 10*3/uL (ref 0.0–0.1)
Basophils Relative: 1 %
Eosinophils Absolute: 0.1 10*3/uL (ref 0.0–0.5)
Eosinophils Relative: 2 %
HCT: 40.6 % (ref 39.0–52.0)
Hemoglobin: 13.3 g/dL (ref 13.0–17.0)
Immature Granulocytes: 0 %
Lymphocytes Relative: 45 %
Lymphs Abs: 1.4 10*3/uL (ref 0.7–4.0)
MCH: 25.5 pg — ABNORMAL LOW (ref 26.0–34.0)
MCHC: 32.8 g/dL (ref 30.0–36.0)
MCV: 77.8 fL — ABNORMAL LOW (ref 80.0–100.0)
Monocytes Absolute: 0.5 10*3/uL (ref 0.1–1.0)
Monocytes Relative: 17 %
Neutro Abs: 1.1 10*3/uL — ABNORMAL LOW (ref 1.7–7.7)
Neutrophils Relative %: 35 %
Platelet Count: 136 10*3/uL — ABNORMAL LOW (ref 150–400)
RBC: 5.22 MIL/uL (ref 4.22–5.81)
RDW: 20.9 % — ABNORMAL HIGH (ref 11.5–15.5)
WBC Count: 3.1 10*3/uL — ABNORMAL LOW (ref 4.0–10.5)
nRBC: 0 % (ref 0.0–0.2)

## 2022-03-17 LAB — CMP (CANCER CENTER ONLY)
ALT: 20 U/L (ref 0–44)
AST: 24 U/L (ref 15–41)
Albumin: 4.2 g/dL (ref 3.5–5.0)
Alkaline Phosphatase: 97 U/L (ref 38–126)
Anion gap: 10 (ref 5–15)
BUN: 17 mg/dL (ref 6–20)
CO2: 30 mmol/L (ref 22–32)
Calcium: 10.3 mg/dL (ref 8.9–10.3)
Chloride: 97 mmol/L — ABNORMAL LOW (ref 98–111)
Creatinine: 1.61 mg/dL — ABNORMAL HIGH (ref 0.61–1.24)
GFR, Estimated: 49 mL/min — ABNORMAL LOW (ref >60)
Glucose, Bld: 130 mg/dL — ABNORMAL HIGH (ref 70–99)
Potassium: 3.3 mmol/L — ABNORMAL LOW (ref 3.5–5.1)
Sodium: 137 mmol/L (ref 135–145)
Total Bilirubin: 0.5 mg/dL (ref 0.3–1.2)
Total Protein: 7.1 g/dL (ref 6.5–8.1)

## 2022-03-17 MED ORDER — DIPHENOXYLATE-ATROPINE 2.5-0.025 MG PO TABS: 2.0000 | ORAL_TABLET | Freq: Four times a day (QID) | ORAL | 0 refills | Status: DC | PRN

## 2022-03-17 MED ORDER — POTASSIUM CHLORIDE CRYS ER 20 MEQ PO TBCR: 20.0000 meq | EXTENDED_RELEASE_TABLET | Freq: Every day | ORAL | 3 refills | Status: DC

## 2022-03-17 MED ORDER — OXALIPLATIN CHEMO INJECTION 100 MG/20ML
85.0000 mg/m2 | Freq: Once | INTRAVENOUS | Status: AC
  Administered 2022-03-17: 180 mg via INTRAVENOUS
  Filled 2022-03-17: qty 36

## 2022-03-17 MED ORDER — POTASSIUM CHLORIDE 10 MEQ/100ML IV SOLN
10.0000 meq | INTRAVENOUS | Status: AC
  Administered 2022-03-17 (×2): 10 meq via INTRAVENOUS
  Filled 2022-03-17: qty 100

## 2022-03-17 MED ORDER — FLUOROURACIL CHEMO INJECTION 2.5 GM/50ML
400.0000 mg/m2 | Freq: Once | INTRAVENOUS | Status: AC
  Administered 2022-03-17: 850 mg via INTRAVENOUS
  Filled 2022-03-17: qty 17

## 2022-03-17 MED ORDER — LEUCOVORIN CALCIUM INJECTION 350 MG
400.0000 mg/m2 | Freq: Once | INTRAVENOUS | Status: AC
  Administered 2022-03-17: 840 mg via INTRAVENOUS
  Filled 2022-03-17: qty 42

## 2022-03-17 MED ORDER — SODIUM CHLORIDE 0.9 % IV SOLN: INTRAVENOUS | Status: DC

## 2022-03-17 MED ORDER — PALONOSETRON HCL INJECTION 0.25 MG/5ML
0.2500 mg | Freq: Once | INTRAVENOUS | Status: AC
  Administered 2022-03-17: 0.25 mg via INTRAVENOUS
  Filled 2022-03-17: qty 5

## 2022-03-17 MED ORDER — DEXTROSE 5 % IV SOLN: Freq: Once | INTRAVENOUS | Status: AC

## 2022-03-17 MED ORDER — SODIUM CHLORIDE 0.9 % IV SOLN
10.0000 mg | Freq: Once | INTRAVENOUS | Status: AC
  Administered 2022-03-17: 10 mg via INTRAVENOUS
  Filled 2022-03-17: qty 10

## 2022-03-17 MED ORDER — SODIUM CHLORIDE 0.9 % IV SOLN
2380.0000 mg/m2 | INTRAVENOUS | Status: DC
  Administered 2022-03-17: 5000 mg via INTRAVENOUS
  Filled 2022-03-17: qty 100

## 2022-03-17 NOTE — Progress Notes (Signed)
?[facility] ?OFFICE PROGRESS NOTE ? ? ?Diagnosis: Rectal cancer ? ?INTERVAL HISTORY:  ? ?Kenneth [name] returns as scheduled.  He completed cycle 2 FOLFOX 03/04/2022.  He contacted the office 03/14/2022 to report weakness, increased ostomy output.  He received a liter of IV fluids. ? ?He is feeling much better since the IV fluids.  He noted an increase in watery output from the ileostomy and nausea around Tuesday of last week.  He is taking 6-8 Imodium tablets a day.  He had no nausea or vomiting after the chemotherapy.  He had a few mouth sores.  The mouth sores resolved with Magic mouthwash.  Cold sensitivity lasted about 4 days.  No persistent neuropathy symptoms. ? ?Objective: ? ?Vital signs in last 24 hours: ? ?Blood pressure 129/79, pulse 99, temperature 98.2 ?F (36.8 ?C), temperature source Oral, resp. rate 18, height '5\' 11"'  (1.803 m), weight 190 lb 12.8 oz (86.5 kg), SpO2 99 %. ?  ? ?HEENT: No thrush or ulcers. ?Resp: Lungs clear bilaterally. ?Cardio: Regular rate and rhythm. ?GI: Abdomen soft and nontender.  No hepatomegaly.  Ileostomy collection bag with watery output. ?Vascular: No leg edema. ?Skin: Palms without erythema.  Decreased skin turgor.  Port-A-Cath without erythema. ? ? ?Lab Results: ? ?Lab Results  ?Component Value Date  ? WBC 3.1 (L) 03/17/2022  ? HGB 13.3 03/17/2022  ? HCT 40.6 03/17/2022  ? MCV 77.8 (L) 03/17/2022  ? PLT 136 (L) 03/17/2022  ? NEUTROABS 1.1 (L) 03/17/2022  ? ? ?Imaging: ? ?No results found. ? ?Medications: I have reviewed the patient's current medications. ? ?Assessment/Plan: ?Rectal cancer-mass at 11 cm on proctoscopy by Dr. Morton Stall 02/04/2022 ?Colonoscopy 12/26/2021-obstructing mass at the rectosigmoid measured at 15 cm, biopsy invasive moderate to poorly differentiated adenocarcinoma, mismatch repair protein expression intact ?CTs 12/31/2021-masslike circumferential thickening of the distal sigmoid colon with mildly enlarged pericolonic lymph nodes, numerous  subcentimeter hypodense liver lesions, splenomegaly ?MRI abdomen 01/07/2022-innumerable T2 hyperintense liver lesions consistent with cysts, spleen unremarkable, no ascites or adenopathy, moderate colonic fecal retention, no bowel obstruction ?MRI pelvis 01/22/2022-T3cN2 tumor above and below the peritoneal reflection, tumor 11.3 cm from the anal verge and 6.1 cm from the sphincter complex, multiple enlarged perirectal nodes, 1.1 cm node versus tumor deposit at the right aspect of the tumor ?Diverting loop ileostomy 01/15/2022 ?Cycle 1 FOLFOX 02/17/2022 ?Cycle 2 FOLFOX 03/04/2022 ?Cycle 3 FOLFOX held on 03/17/2022 due to neutropenia ?Gout ?Psoriasis ?Hypertension ?Left scalene node versus cyst/lipoma on exam 02/03/2022-CT neck 02/10/2022 with 9.5 mm lymph node in the left supraclavicular region posterior to the sternocleidomastoid muscle.  No other prominent lymph nodes in the neck.  Biopsy 02/12/2022 compatible with benign lymph node, negative for metastatic carcinoma. ? ?Disposition: [name] appears stable.  He has completed 2 cycles of FOLFOX.  CBC from today shows moderate neutropenia.  Plan to proceed with treatment today as scheduled with white cell growth factor support on day of pump discontinuation.  Potential toxicities reviewed including bone pain, rash, splenic rupture.  He agrees with this plan.  He will contact the office with fever, chills, other signs of infection. ? ?He has a high output ileostomy and has some signs of dehydration today.  He will receive a liter of IV fluids and 20 meq of potassium.  He will continue Imodium.  Prescription sent to his pharmacy for Lomotil.  He will continue to push fluids orally. ? ?He will return for lab, follow-up, FOLFOX in 2 weeks.  We are available to  see him sooner if needed. ? ?Patient seen with Dr. [name]. ? ? ? ? ? ?[name] ANP/GNP-BC  ? ?03/17/2022  ?9:26 AM ? ?This was a shared visit with [name].  Mr. Caliendo has completed 2 cycles of FOLFOX.  He  continues to have high output from the ileostomy.  We encouraged him to increase his oral fluid intake.  He will receive intravenous fluids today.  He has mild neutropenia.  We discussed the risk of proceeding with chemotherapy versus a treatment delay.  He would like to proceed.  He will receive G-CSF support. ? ?I was present for greater than 50% of today's visit.  I performed medical decision making. ? ?[name], MD ? ? ? ? ? ?

## 2022-03-17 NOTE — Progress Notes (Signed)
Patient presents for treatment. RN assessment completed along with the following: ? ?Labs/vitals reviewed - Yes, and please see collab nurse note.    ?Weight within 10% of previous measurement - Yes ?Informed consent completed and reflects current therapy/intent - Yes, on date 02/17/2022             ?Provider progress note reviewed - Yes, today's provider note was reviewed. ?Treatment/Antibody/Supportive plan reviewed - Yes, and there are no adjustments needed for today's treatment. ?S&H and other orders reviewed - Yes, and there are no additional orders identified. ?Previous treatment date reviewed - Yes, and the appropriate amount of time has elapsed between treatments. ?Clinic Hand Off Received from - Yes from [place], RN ? ?Patient to proceed with treatment.  ? ?

## 2022-03-17 NOTE — Progress Notes (Addendum)
Patient seen by [name] NP today ? ?Vitals are within treatment parameters. ? ?Labs reviewed by [name] NP and are not all within treatment parameters. ANC 1.1; Creatinine 1.61 ? ?Per physician team, will receive chemo today with addition of Udenyca on day 3. Will receive 1 liter NS and 20 meq IV KCl today as well. ?

## 2022-03-17 NOTE — Patient Instructions (Signed)
[facility]   ?Discharge Instructions: ?Thank you for choosing [facility] to provide your oncology and hematology care.  ? ?If you have a lab appointment with the [facility], please go directly to the [facility] and check in at the registration area. ?  ?Wear comfortable clothing and clothing appropriate for easy access to any Portacath or PICC line.  ? ?We strive to give you quality time with your provider. You may need to reschedule your appointment if you arrive late (15 or more minutes).  Arriving late affects you and other patients whose appointments are after yours.  Also, if you miss three or more appointments without notifying the office, you may be dismissed from the clinic at the provider?s discretion.    ?  ?For prescription refill requests, have your pharmacy contact our office and allow 72 hours for refills to be completed.   ? ?Today you received the following chemotherapy and/or immunotherapy agents Oxaliplatin (ELOXATIN), Leucovorin & Flourouracil (ADRUCIL).    ?  ?To help prevent nausea and vomiting after your treatment, we encourage you to take your nausea medication as directed. ? ?BELOW ARE SYMPTOMS THAT SHOULD BE REPORTED IMMEDIATELY: ?*FEVER GREATER THAN 100.4 F (38 ?C) OR HIGHER ?*CHILLS OR SWEATING ?*NAUSEA AND VOMITING THAT IS NOT CONTROLLED WITH YOUR NAUSEA MEDICATION ?*UNUSUAL SHORTNESS OF BREATH ?*UNUSUAL BRUISING OR BLEEDING ?*URINARY PROBLEMS (pain or burning when urinating, or frequent urination) ?*BOWEL PROBLEMS (unusual diarrhea, constipation, pain near the anus) ?TENDERNESS IN MOUTH AND THROAT WITH OR WITHOUT PRESENCE OF ULCERS (sore throat, sores in mouth, or a toothache) ?UNUSUAL RASH, SWELLING OR PAIN  ?UNUSUAL VAGINAL DISCHARGE OR ITCHING  ? ?Items with * indicate a potential emergency and should be followed up as soon as possible or go to the Emergency Department if any problems should occur. ? ?Please show the CHEMOTHERAPY ALERT  CARD or IMMUNOTHERAPY ALERT CARD at check-in to the Emergency Department and triage nurse. ? ?Should you have questions after your visit or need to cancel or reschedule your appointment, please contact [facility]  Dept: [phone]  and follow the prompts.  Office hours are 8:00 a.m. to 4:30 p.m. Monday - Friday. Please note that voicemails left after 4:00 p.m. may not be returned until the following business day.  We are closed weekends and major holidays. You have access to a nurse at all times for urgent questions. Please call the main number to the clinic Dept: [phone] and follow the prompts. ? ? ?For any non-urgent questions, you may also contact your provider using MyChart. We now offer e-Visits for anyone 40 and older to request care online for non-urgent symptoms. For details visit [email or web address]. ?  ?Also download the MyChart app! Go to the app store, search "MyChart", open the app, select [facility], and log in with your MyChart username and password. ? ?Due to Covid, a mask is required upon entering the hospital/clinic. If you do not have a mask, one will be given to you upon arrival. For doctor visits, patients may have 1 support person aged 17 or older with them. For treatment visits, patients cannot have anyone with them due to current Covid guidelines and our immunocompromised population.  ? ?Oxaliplatin Injection ?What is this medication? ?OXALIPLATIN (ox AL i PLA tin) is a chemotherapy drug. It targets fast dividing cells, like cancer cells, and causes these cells to die. This medicine is used to treat cancers of the colon and rectum, and  many other cancers. ?This medicine may be used for other purposes; ask your health care provider or pharmacist if you have questions. ?COMMON BRAND NAME(S): Eloxatin ?What should I tell my care team before I take this medication? ?They need to know if you have any of these conditions: ?heart disease ?history of irregular  heartbeat ?liver disease ?low blood counts, like white cells, platelets, or red blood cells ?lung or breathing disease, like asthma ?take medicines that treat or prevent blood clots ?tingling of the fingers or toes, or other nerve disorder ?an unusual or allergic reaction to oxaliplatin, other chemotherapy, other medicines, foods, dyes, or preservatives ?pregnant or trying to get pregnant ?breast-feeding ?How should I use this medication? ?This drug is given as an infusion into a vein. It is administered in a hospital or clinic by a specially trained health care professional. ?Talk to your pediatrician regarding the use of this medicine in children. Special care may be needed. ?Overdosage: If you think you have taken too much of this medicine contact a poison control center or emergency room at once. ?NOTE: This medicine is only for you. Do not share this medicine with others. ?What if I miss a dose? ?It is important not to miss a dose. Call your doctor or health care professional if you are unable to keep an appointment. ?What may interact with this medication? ?Do not take this medicine with any of the following medications: ?cisapride ?dronedarone ?pimozide ?thioridazine ?This medicine may also interact with the following medications: ?aspirin and aspirin-like medicines ?certain medicines that treat or prevent blood clots like warfarin, apixaban, dabigatran, and rivaroxaban ?cisplatin ?cyclosporine ?diuretics ?medicines for infection like acyclovir, adefovir, amphotericin B, bacitracin, cidofovir, foscarnet, ganciclovir, gentamicin, pentamidine, vancomycin ?NSAIDs, medicines for pain and inflammation, like ibuprofen or naproxen ?other medicines that prolong the QT interval (an abnormal heart rhythm) ?pamidronate ?zoledronic acid ?This list may not describe all possible interactions. Give your health care provider a list of all the medicines, herbs, non-prescription drugs, or dietary supplements you use. Also tell  them if you smoke, drink alcohol, or use illegal drugs. Some items may interact with your medicine. ?What should I watch for while using this medication? ?Your condition will be monitored carefully while you are receiving this medicine. ?You may need blood work done while you are taking this medicine. ?This medicine may make you feel generally unwell. This is not uncommon as chemotherapy can affect healthy cells as well as cancer cells. Report any side effects. Continue your course of treatment even though you feel ill unless your healthcare professional tells you to stop. ?This medicine can make you more sensitive to cold. Do not drink cold drinks or use ice. Cover exposed skin before coming in contact with cold temperatures or cold objects. When out in cold weather wear warm clothing and cover your mouth and nose to warm the air that goes into your lungs. Tell your doctor if you get sensitive to the cold. ?Do not become pregnant while taking this medicine or for 9 months after stopping it. Women should inform their health care professional if they wish to become pregnant or think they might be pregnant. Men should not father a child while taking this medicine and for 6 months after stopping it. There is potential for serious side effects to an unborn child. Talk to your health care professional for more information. ?Do not breast-feed a child while taking this medicine or for 3 months after stopping it. ?This medicine has caused ovarian failure in  some women. This medicine may make it more difficult to get pregnant. Talk to your health care professional if you are concerned about your fertility. ?This medicine has caused decreased sperm counts in some men. This may make it more difficult to father a child. Talk to your health care professional if you are concerned about your fertility. ?This medicine may increase your risk of getting an infection. Call your health care professional for advice if you get a fever,  chills, or sore throat, or other symptoms of a cold or flu. Do not treat yourself. Try to avoid being around people who are sick. ?Avoid taking medicines that contain aspirin, acetaminophen, ibuprof

## 2022-03-18 ENCOUNTER — Encounter: Payer: Self-pay | Admitting: Oncology

## 2022-03-19 ENCOUNTER — Inpatient Hospital Stay: Payer: BC Managed Care – PPO

## 2022-03-19 VITALS — BP 117/83 | HR 76 | Temp 97.9°F | Resp 18

## 2022-03-19 DIAGNOSIS — I10 Essential (primary) hypertension: Secondary | ICD-10-CM | POA: Diagnosis not present

## 2022-03-19 DIAGNOSIS — Z5189 Encounter for other specified aftercare: Secondary | ICD-10-CM | POA: Diagnosis not present

## 2022-03-19 DIAGNOSIS — C20 Malignant neoplasm of rectum: Secondary | ICD-10-CM

## 2022-03-19 DIAGNOSIS — L409 Psoriasis, unspecified: Secondary | ICD-10-CM | POA: Diagnosis not present

## 2022-03-19 DIAGNOSIS — R531 Weakness: Secondary | ICD-10-CM | POA: Diagnosis not present

## 2022-03-19 DIAGNOSIS — Z923 Personal history of irradiation: Secondary | ICD-10-CM | POA: Diagnosis not present

## 2022-03-19 DIAGNOSIS — E860 Dehydration: Secondary | ICD-10-CM | POA: Diagnosis not present

## 2022-03-19 DIAGNOSIS — M109 Gout, unspecified: Secondary | ICD-10-CM | POA: Diagnosis not present

## 2022-03-19 DIAGNOSIS — T451X5A Adverse effect of antineoplastic and immunosuppressive drugs, initial encounter: Secondary | ICD-10-CM | POA: Diagnosis not present

## 2022-03-19 DIAGNOSIS — Z5111 Encounter for antineoplastic chemotherapy: Secondary | ICD-10-CM | POA: Diagnosis not present

## 2022-03-19 DIAGNOSIS — D701 Agranulocytosis secondary to cancer chemotherapy: Secondary | ICD-10-CM | POA: Diagnosis not present

## 2022-03-19 MED ORDER — SODIUM CHLORIDE 0.9% FLUSH
10.0000 mL | INTRAVENOUS | Status: DC | PRN
  Administered 2022-03-19: 10 mL

## 2022-03-19 MED ORDER — HEPARIN SOD (PORK) LOCK FLUSH 100 UNIT/ML IV SOLN
500.0000 [IU] | Freq: Once | INTRAVENOUS | Status: AC | PRN
  Administered 2022-03-19: 500 [IU]

## 2022-03-19 MED ORDER — PEGFILGRASTIM-CBQV 6 MG/0.6ML ~~LOC~~ SOSY
6.0000 mg | PREFILLED_SYRINGE | Freq: Once | SUBCUTANEOUS | Status: AC
  Administered 2022-03-19: 6 mg via SUBCUTANEOUS
  Filled 2022-03-19: qty 0.6

## 2022-03-19 NOTE — Patient Instructions (Signed)

## 2022-03-21 ENCOUNTER — Telehealth: Payer: Self-pay | Admitting: *Deleted

## 2022-03-21 ENCOUNTER — Other Ambulatory Visit: Payer: Self-pay | Admitting: Nurse Practitioner

## 2022-03-21 DIAGNOSIS — C20 Malignant neoplasm of rectum: Secondary | ICD-10-CM

## 2022-03-21 NOTE — Telephone Encounter (Signed)
Received refill request for Lomotil org inially ordered on 03/17/22. Confirmed with Mrs. Goudeau that he is still taking Imoidium ~ 6/day in addition to #2 Lomotil today and is improved. Stool is thickening up better now. He is not out, but starting to get low. NP notified. ?

## 2022-03-25 ENCOUNTER — Other Ambulatory Visit: Payer: Self-pay | Admitting: Nurse Practitioner

## 2022-03-25 ENCOUNTER — Other Ambulatory Visit: Payer: Self-pay | Admitting: *Deleted

## 2022-03-25 ENCOUNTER — Other Ambulatory Visit: Payer: Self-pay

## 2022-03-25 ENCOUNTER — Ambulatory Visit: Payer: BC Managed Care – PPO

## 2022-03-25 ENCOUNTER — Ambulatory Visit: Payer: BC Managed Care – PPO | Admitting: Nurse Practitioner

## 2022-03-25 ENCOUNTER — Inpatient Hospital Stay: Payer: BC Managed Care – PPO

## 2022-03-25 ENCOUNTER — Other Ambulatory Visit: Payer: BC Managed Care – PPO

## 2022-03-25 DIAGNOSIS — T451X5A Adverse effect of antineoplastic and immunosuppressive drugs, initial encounter: Secondary | ICD-10-CM | POA: Diagnosis not present

## 2022-03-25 DIAGNOSIS — Z5111 Encounter for antineoplastic chemotherapy: Secondary | ICD-10-CM | POA: Diagnosis not present

## 2022-03-25 DIAGNOSIS — E860 Dehydration: Secondary | ICD-10-CM | POA: Diagnosis not present

## 2022-03-25 DIAGNOSIS — L409 Psoriasis, unspecified: Secondary | ICD-10-CM | POA: Diagnosis not present

## 2022-03-25 DIAGNOSIS — Z5189 Encounter for other specified aftercare: Secondary | ICD-10-CM | POA: Diagnosis not present

## 2022-03-25 DIAGNOSIS — D701 Agranulocytosis secondary to cancer chemotherapy: Secondary | ICD-10-CM | POA: Diagnosis not present

## 2022-03-25 DIAGNOSIS — C20 Malignant neoplasm of rectum: Secondary | ICD-10-CM | POA: Diagnosis not present

## 2022-03-25 DIAGNOSIS — I10 Essential (primary) hypertension: Secondary | ICD-10-CM | POA: Diagnosis not present

## 2022-03-25 DIAGNOSIS — Z923 Personal history of irradiation: Secondary | ICD-10-CM | POA: Diagnosis not present

## 2022-03-25 DIAGNOSIS — M109 Gout, unspecified: Secondary | ICD-10-CM | POA: Diagnosis not present

## 2022-03-25 DIAGNOSIS — R531 Weakness: Secondary | ICD-10-CM | POA: Diagnosis not present

## 2022-03-25 MED ORDER — DIPHENOXYLATE-ATROPINE 2.5-0.025 MG PO TABS: ORAL_TABLET | ORAL | 0 refills | Status: DC

## 2022-03-25 MED ORDER — PROCHLORPERAZINE MALEATE 10 MG PO TABS: 10.0000 mg | ORAL_TABLET | Freq: Four times a day (QID) | ORAL | 2 refills | Status: DC | PRN

## 2022-03-25 MED ORDER — SODIUM CHLORIDE 0.9 % IV SOLN: INTRAVENOUS | Status: DC

## 2022-03-27 ENCOUNTER — Inpatient Hospital Stay: Payer: BC Managed Care – PPO

## 2022-03-28 ENCOUNTER — Inpatient Hospital Stay: Payer: BC Managed Care – PPO

## 2022-03-28 ENCOUNTER — Other Ambulatory Visit: Payer: Self-pay

## 2022-03-28 VITALS — BP 126/98 | HR 70 | Temp 97.8°F | Resp 18 | Ht 71.0 in | Wt 193.6 lb

## 2022-03-28 DIAGNOSIS — C20 Malignant neoplasm of rectum: Secondary | ICD-10-CM | POA: Diagnosis not present

## 2022-03-28 DIAGNOSIS — E871 Hypo-osmolality and hyponatremia: Secondary | ICD-10-CM

## 2022-03-28 DIAGNOSIS — E860 Dehydration: Secondary | ICD-10-CM | POA: Diagnosis not present

## 2022-03-28 DIAGNOSIS — D701 Agranulocytosis secondary to cancer chemotherapy: Secondary | ICD-10-CM | POA: Diagnosis not present

## 2022-03-28 DIAGNOSIS — L409 Psoriasis, unspecified: Secondary | ICD-10-CM | POA: Diagnosis not present

## 2022-03-28 DIAGNOSIS — I10 Essential (primary) hypertension: Secondary | ICD-10-CM | POA: Diagnosis not present

## 2022-03-28 DIAGNOSIS — T451X5A Adverse effect of antineoplastic and immunosuppressive drugs, initial encounter: Secondary | ICD-10-CM | POA: Diagnosis not present

## 2022-03-28 DIAGNOSIS — Z5111 Encounter for antineoplastic chemotherapy: Secondary | ICD-10-CM | POA: Diagnosis not present

## 2022-03-28 DIAGNOSIS — Z923 Personal history of irradiation: Secondary | ICD-10-CM | POA: Diagnosis not present

## 2022-03-28 DIAGNOSIS — M109 Gout, unspecified: Secondary | ICD-10-CM | POA: Diagnosis not present

## 2022-03-28 DIAGNOSIS — Z5189 Encounter for other specified aftercare: Secondary | ICD-10-CM | POA: Diagnosis not present

## 2022-03-28 DIAGNOSIS — R531 Weakness: Secondary | ICD-10-CM | POA: Diagnosis not present

## 2022-03-28 MED ORDER — SODIUM CHLORIDE 0.9% FLUSH
10.0000 mL | Freq: Once | INTRAVENOUS | Status: AC
  Administered 2022-03-28: 10 mL via INTRAVENOUS

## 2022-03-28 MED ORDER — SODIUM CHLORIDE 0.9 % IV SOLN: INTRAVENOUS | Status: DC

## 2022-03-28 MED ORDER — HEPARIN SOD (PORK) LOCK FLUSH 100 UNIT/ML IV SOLN
500.0000 [IU] | Freq: Once | INTRAVENOUS | Status: AC
  Administered 2022-03-28: 500 [IU] via INTRAVENOUS

## 2022-03-28 NOTE — Patient Instructions (Signed)
Rehydration, Adult Rehydration is the replacement of body fluids, salts, and minerals (electrolytes) that are lost during dehydration. Dehydration is when there is not enough water or other fluids in the body. This happens when you lose more fluids than you take in. Common causes of dehydration include: Not drinking enough fluids. This can occur when you are ill or doing activities that require a lot of energy, especially in hot weather. Conditions that cause loss of water or other fluids, such as diarrhea, vomiting, sweating, or urinating a lot. Other illnesses, such as fever or infection. Certain medicines, such as those that remove excess fluid from the body (diuretics). Symptoms of mild or moderate dehydration may include thirst, dry lips and mouth, and dizziness. Symptoms of severe dehydration may include increased heart rate, confusion, fainting, and not urinating. For severe dehydration, you may need to get fluids through an IV at the hospital. For mild or moderate dehydration, you can usually rehydrate at home by drinking certain fluids as told by your health care provider. What are the risks? Generally, rehydration is safe. However, taking in too much fluid (overhydration) can be a problem. This is rare. Overhydration can cause an electrolyte imbalance, kidney failure, or a decrease in salt (sodium) levels in the body. Supplies needed You will need an oral rehydration solution (ORS) if your health care provider tells you to use one. This is a drink to treat dehydration. It can be found in pharmacies and retail stores. How to rehydrate Fluids Follow instructions from your health care provider for rehydration. The kind of fluid and the amount you should drink depend on your condition. In general, you should choose drinks that you prefer. If told by your health care provider, drink an ORS. Make an ORS by following instructions on the package. Start by drinking small amounts, about  cup (120  mL) every 5-10 minutes. Slowly increase how much you drink until you have taken the amount recommended by your health care provider. Drink enough clear fluids to keep your urine pale yellow. If you were told to drink an ORS, finish it first, then start slowly drinking other clear fluids. Drink fluids such as: Water. This includes sparkling water and flavored water. Drinking only water can lead to having too little sodium in your body (hyponatremia). Follow the advice of your health care provider. Water from ice chips you suck on. Fruit juice with water you add to it (diluted). Sports drinks. Hot or cold herbal teas. Broth-based soups. Milk or milk products. Food Follow instructions from your health care provider about what to eat while you rehydrate. Your health care provider may recommend that you slowly begin eating regular foods in small amounts. Eat foods that contain a healthy balance of electrolytes, such as bananas, oranges, potatoes, tomatoes, and spinach. Avoid foods that are greasy or contain a lot of sugar. In some cases, you may get nutrition through a feeding tube that is passed through your nose and into your stomach (nasogastric tube, or NG tube). This may be done if you have uncontrolled vomiting or diarrhea. Beverages to avoid  Certain beverages may make dehydration worse. While you rehydrate, avoid drinking alcohol. How to tell if you are recovering from dehydration You may be recovering from dehydration if: You are urinating more often than before you started rehydrating. Your urine is pale yellow. Your energy level improves. You vomit less frequently. You have diarrhea less frequently. Your appetite improves or returns to normal. You feel less dizzy or less light-headed.   Your skin tone and color start to look more normal. Follow these instructions at home: Take over-the-counter and prescription medicines only as told by your health care provider. Do not take sodium  tablets. Doing this can lead to having too much sodium in your body (hypernatremia). Contact a health care provider if: You continue to have symptoms of mild or moderate dehydration, such as: Thirst. Dry lips. Slightly dry mouth. Dizziness. Dark urine or less urine than normal. Muscle cramps. You continue to vomit or have diarrhea. Get help right away if you: Have symptoms of dehydration that get worse. Have a fever. Have a severe headache. Have been vomiting and the following happens: Your vomiting gets worse or does not go away. Your vomit includes blood or green matter (bile). You cannot eat or drink without vomiting. Have problems with urination or bowel movements, such as: Diarrhea that gets worse or does not go away. Blood in your stool (feces). This may cause stool to look black and tarry. Not urinating, or urinating only a small amount of very dark urine, within 6-8 hours. Have trouble breathing. Have symptoms that get worse with treatment. These symptoms may represent a serious problem that is an emergency. Do not wait to see if the symptoms will go away. Get medical help right away. Call your local emergency services (911 in the [place] Do not drive yourself to the hospital. Summary Rehydration is the replacement of body fluids and minerals (electrolytes) that are lost during dehydration. Follow instructions from your health care provider for rehydration. The kind of fluid and amount you should drink depend on your condition. Slowly increase how much you drink until you have taken the amount recommended by your health care provider. Contact your health care provider if you continue to show signs of mild or moderate dehydration. This information is not intended to replace advice given to you by your health care provider. Make sure you discuss any questions you have with your health care provider. Document Revised: 01/04/2020 Document Reviewed: 11/14/2019 Elsevier Patient  Education  2023 [employer]  

## 2022-03-30 ENCOUNTER — Other Ambulatory Visit: Payer: Self-pay | Admitting: Oncology

## 2022-03-31 ENCOUNTER — Inpatient Hospital Stay: Payer: BC Managed Care – PPO

## 2022-03-31 ENCOUNTER — Other Ambulatory Visit: Payer: Self-pay | Admitting: *Deleted

## 2022-03-31 ENCOUNTER — Encounter: Payer: Self-pay | Admitting: Oncology

## 2022-03-31 ENCOUNTER — Encounter: Payer: Self-pay | Admitting: *Deleted

## 2022-03-31 ENCOUNTER — Ambulatory Visit: Payer: BC Managed Care – PPO

## 2022-03-31 ENCOUNTER — Inpatient Hospital Stay: Payer: BC Managed Care – PPO | Admitting: Oncology

## 2022-03-31 VITALS — BP 129/87 | HR 92 | Temp 98.2°F | Resp 18 | Ht 71.0 in | Wt 184.6 lb

## 2022-03-31 DIAGNOSIS — D701 Agranulocytosis secondary to cancer chemotherapy: Secondary | ICD-10-CM | POA: Diagnosis not present

## 2022-03-31 DIAGNOSIS — L409 Psoriasis, unspecified: Secondary | ICD-10-CM | POA: Diagnosis not present

## 2022-03-31 DIAGNOSIS — C20 Malignant neoplasm of rectum: Secondary | ICD-10-CM

## 2022-03-31 DIAGNOSIS — M109 Gout, unspecified: Secondary | ICD-10-CM | POA: Diagnosis not present

## 2022-03-31 DIAGNOSIS — Z5189 Encounter for other specified aftercare: Secondary | ICD-10-CM | POA: Diagnosis not present

## 2022-03-31 DIAGNOSIS — Z95828 Presence of other vascular implants and grafts: Secondary | ICD-10-CM

## 2022-03-31 DIAGNOSIS — Z923 Personal history of irradiation: Secondary | ICD-10-CM | POA: Diagnosis not present

## 2022-03-31 DIAGNOSIS — I10 Essential (primary) hypertension: Secondary | ICD-10-CM | POA: Diagnosis not present

## 2022-03-31 DIAGNOSIS — R531 Weakness: Secondary | ICD-10-CM | POA: Diagnosis not present

## 2022-03-31 DIAGNOSIS — E860 Dehydration: Secondary | ICD-10-CM | POA: Diagnosis not present

## 2022-03-31 DIAGNOSIS — Z5111 Encounter for antineoplastic chemotherapy: Secondary | ICD-10-CM | POA: Diagnosis not present

## 2022-03-31 DIAGNOSIS — T451X5A Adverse effect of antineoplastic and immunosuppressive drugs, initial encounter: Secondary | ICD-10-CM | POA: Diagnosis not present

## 2022-03-31 LAB — CBC WITH DIFFERENTIAL (CANCER CENTER ONLY)
Abs Immature Granulocytes: 1.12 K/uL — ABNORMAL HIGH (ref 0.00–0.07)
Basophils Absolute: 0.1 K/uL (ref 0.0–0.1)
Basophils Relative: 0 %
Eosinophils Absolute: 0.1 K/uL (ref 0.0–0.5)
Eosinophils Relative: 1 %
HCT: 45.5 % (ref 39.0–52.0)
Hemoglobin: 15 g/dL (ref 13.0–17.0)
Immature Granulocytes: 7 %
Lymphocytes Relative: 13 %
Lymphs Abs: 2.1 K/uL (ref 0.7–4.0)
MCH: 26.2 pg (ref 26.0–34.0)
MCHC: 33 g/dL (ref 30.0–36.0)
MCV: 79.4 fL — ABNORMAL LOW (ref 80.0–100.0)
Monocytes Absolute: 1 K/uL (ref 0.1–1.0)
Monocytes Relative: 6 %
Neutro Abs: 12.4 K/uL — ABNORMAL HIGH (ref 1.7–7.7)
Neutrophils Relative %: 73 %
Platelet Count: 91 K/uL — ABNORMAL LOW (ref 150–400)
RBC: 5.73 MIL/uL (ref 4.22–5.81)
RDW: 23.7 % — ABNORMAL HIGH (ref 11.5–15.5)
WBC Count: 16.7 K/uL — ABNORMAL HIGH (ref 4.0–10.5)
nRBC: 0.1 % (ref 0.0–0.2)

## 2022-03-31 LAB — CMP (CANCER CENTER ONLY)
ALT: 29 U/L (ref 0–44)
AST: 33 U/L (ref 15–41)
Albumin: 4.4 g/dL (ref 3.5–5.0)
Alkaline Phosphatase: 189 U/L — ABNORMAL HIGH (ref 38–126)
Anion gap: 12 (ref 5–15)
BUN: 23 mg/dL — ABNORMAL HIGH (ref 6–20)
CO2: 30 mmol/L (ref 22–32)
Calcium: 10 mg/dL (ref 8.9–10.3)
Chloride: 95 mmol/L — ABNORMAL LOW (ref 98–111)
Creatinine: 2.41 mg/dL — ABNORMAL HIGH (ref 0.61–1.24)
GFR, Estimated: 30 mL/min — ABNORMAL LOW (ref >60)
Glucose, Bld: 123 mg/dL — ABNORMAL HIGH (ref 70–99)
Potassium: 4 mmol/L (ref 3.5–5.1)
Sodium: 137 mmol/L (ref 135–145)
Total Bilirubin: 0.5 mg/dL (ref 0.3–1.2)
Total Protein: 7.8 g/dL (ref 6.5–8.1)

## 2022-03-31 MED ORDER — HEPARIN SOD (PORK) LOCK FLUSH 100 UNIT/ML IV SOLN
500.0000 [IU] | Freq: Once | INTRAVENOUS | Status: AC
  Administered 2022-03-31: 500 [IU] via INTRAVENOUS

## 2022-03-31 MED ORDER — SODIUM CHLORIDE 0.9 % IV SOLN: INTRAVENOUS | Status: DC

## 2022-03-31 MED ORDER — SODIUM CHLORIDE 0.9% FLUSH
10.0000 mL | Freq: Once | INTRAVENOUS | Status: AC
  Administered 2022-03-31: 10 mL via INTRAVENOUS

## 2022-03-31 NOTE — Progress Notes (Signed)
Will hold tx today and receive 1 liter NS. ?

## 2022-03-31 NOTE — Progress Notes (Addendum)
?[name] ?OFFICE PROGRESS NOTE ? ? ?Diagnosis: Rectal cancer ? ?INTERVAL HISTORY:  ? ?Kenneth [name] completed another cycle of FOLFOX 03/17/2022.  No nausea/vomiting.  He did not have mouth sores following this cycle of chemotherapy.  He empties the ileostomy 5-6 times per day.  The ileostomy is partially full.  He takes Imodium and Lomotil 3 times daily.  The mouth feels dry.  Cold sensitivity lasted for 1 week following chemotherapy.  No neuropathy symptoms at present.  He is eating.  He received intravenous fluids on 5/9 and 5/12.  He feels better after the IV fluids. ?He reports a few episodes of mild bone pain following G-CSF. ?Objective: ? ?Vital signs in last 24 hours: ? ?Blood pressure 129/87, pulse 92, temperature 98.2 ?F (36.8 ?C), temperature source Oral, resp. rate 18, height '5\' 11"'  (1.803 m), weight 184 lb 9.6 oz (83.7 kg), SpO2 100 %. ?  ? ?HEENT: No thrush or ulcers ?Resp: Lungs clear bilaterally ?Cardio: Distant heart sounds, regular rhythm ?GI: No hepatosplenomegaly, right abdomen ileostomy ?Vascular: No leg edema, diminished skin turgor ? ?Skin: Palms without erythema ? ?Portacath/PICC-without erythema ? ?Lab Results: ? ?Lab Results  ?Component Value Date  ? WBC 3.1 (L) 03/17/2022  ? HGB 13.3 03/17/2022  ? HCT 40.6 03/17/2022  ? MCV 77.8 (L) 03/17/2022  ? PLT 136 (L) 03/17/2022  ? NEUTROABS 1.1 (L) 03/17/2022  ? ? ?CMP  ?Lab Results  ?Component Value Date  ? NA 137 03/17/2022  ? K 3.3 (L) 03/17/2022  ? CL 97 (L) 03/17/2022  ? CO2 30 03/17/2022  ? GLUCOSE 130 (H) 03/17/2022  ? BUN 17 03/17/2022  ? CREATININE 1.61 (H) 03/17/2022  ? CALCIUM 10.3 03/17/2022  ? PROT 7.1 03/17/2022  ? ALBUMIN 4.2 03/17/2022  ? AST 24 03/17/2022  ? ALT 20 03/17/2022  ? ALKPHOS 97 03/17/2022  ? BILITOT 0.5 03/17/2022  ? GFRNONAA 49 (L) 03/17/2022  ? GFRAA 84 03/17/2008  ? ? ?Lab Results  ?Component Value Date  ? CEA 5.43 (H) 02/10/2022  ? ? ? ?Medications: I have reviewed the patient's current  medications. ? ? ?Assessment/Plan: ?Rectal cancer-mass at 11 cm on proctoscopy by Dr. Morton Stall 02/04/2022 ?Colonoscopy 12/26/2021-obstructing mass at the rectosigmoid measured at 15 cm, biopsy invasive moderate to poorly differentiated adenocarcinoma, mismatch repair protein expression intact ?CTs 12/31/2021-masslike circumferential thickening of the distal sigmoid colon with mildly enlarged pericolonic lymph nodes, numerous subcentimeter hypodense liver lesions, splenomegaly ?MRI abdomen 01/07/2022-innumerable T2 hyperintense liver lesions consistent with cysts, spleen unremarkable, no ascites or adenopathy, moderate colonic fecal retention, no bowel obstruction ?MRI pelvis 01/22/2022-T3cN2 tumor above and below the peritoneal reflection, tumor 11.3 cm from the anal verge and 6.1 cm from the sphincter complex, multiple enlarged perirectal nodes, 1.1 cm node versus tumor deposit at the right aspect of the tumor ?Diverting loop ileostomy 01/15/2022 ?Cycle 1 FOLFOX 02/17/2022 ?Cycle 2 FOLFOX 03/04/2022 ?Cycle 3 FOLFOX held on 03/17/2022 due to neutropenia ?Cycle 3 FOLFOX 03/17/2022, Udenyca ?Cycle 4 FOLFOX 03/31/2022, Udenyca ?Gout ?Psoriasis ?Hypertension ?Left scalene node versus cyst/lipoma on exam 02/03/2022-CT neck 02/10/2022 with 9.5 mm lymph node in the left supraclavicular region posterior to the sternocleidomastoid muscle.  No other prominent lymph nodes in the neck.  Biopsy 02/12/2022 compatible with benign lymph node, negative for metastatic carcinoma. ? ? ? ?Disposition: ?[name] has completed 3 cycles of FOLFOX.  He is tolerating the chemotherapy well.  He continues to have evidence of a high output ileostomy.  I encouraged him to continue  oral hydration and increase his nutrition intake.  He has lost weight over the past month.  He will receive additional intravenous fluids today and again on 04/02/2022.  He will call if he feels he needs additional IV fluids. ? ?He will return for an office visit and chemotherapy in 2  weeks.  We will follow-up on the CBC and chemistry panel from today.  He will continue to receive G-CSF support. ? ?Betsy Coder, MD ? ?03/31/2022  ?8:49 AM ?Addendum: Today's labs returned consistent with worsened dehydration.  Chemotherapy will be held.  He will receive intravenous fluids today, 04/02/2022, and 04/04/2022.  The neck cycle of chemotherapy will be scheduled for 1 week. ? ?

## 2022-03-31 NOTE — Progress Notes (Signed)
Patient seen by Dr. [name] today ? ?Vitals are within treatment parameters. ? ?Labs reviewed by Dr. [name] and are not all within treatment parameters. Platelets=91,000 and creatinine=2.41 ? ?Per physician team, patient will not be receiving treatment today. ?He is dehydrated per MD: will receive IV fluids 1 liter NS today, Wed and Friday. Lab/flush/OV and treat on 04/07/22  ?

## 2022-03-31 NOTE — Patient Instructions (Signed)
Rehydration, Adult Rehydration is the replacement of body fluids, salts, and minerals (electrolytes) that are lost during dehydration. Dehydration is when there is not enough water or other fluids in the body. This happens when you lose more fluids than you take in. Common causes of dehydration include: Not drinking enough fluids. This can occur when you are ill or doing activities that require a lot of energy, especially in hot weather. Conditions that cause loss of water or other fluids, such as diarrhea, vomiting, sweating, or urinating a lot. Other illnesses, such as fever or infection. Certain medicines, such as those that remove excess fluid from the body (diuretics). Symptoms of mild or moderate dehydration may include thirst, dry lips and mouth, and dizziness. Symptoms of severe dehydration may include increased heart rate, confusion, fainting, and not urinating. For severe dehydration, you may need to get fluids through an IV at the hospital. For mild or moderate dehydration, you can usually rehydrate at home by drinking certain fluids as told by your health care provider. What are the risks? Generally, rehydration is safe. However, taking in too much fluid (overhydration) can be a problem. This is rare. Overhydration can cause an electrolyte imbalance, kidney failure, or a decrease in salt (sodium) levels in the body. Supplies needed You will need an oral rehydration solution (ORS) if your health care provider tells you to use one. This is a drink to treat dehydration. It can be found in pharmacies and retail stores. How to rehydrate Fluids Follow instructions from your health care provider for rehydration. The kind of fluid and the amount you should drink depend on your condition. In general, you should choose drinks that you prefer. If told by your health care provider, drink an ORS. Make an ORS by following instructions on the package. Start by drinking small amounts, about  cup (120  mL) every 5-10 minutes. Slowly increase how much you drink until you have taken the amount recommended by your health care provider. Drink enough clear fluids to keep your urine pale yellow. If you were told to drink an ORS, finish it first, then start slowly drinking other clear fluids. Drink fluids such as: Water. This includes sparkling water and flavored water. Drinking only water can lead to having too little sodium in your body (hyponatremia). Follow the advice of your health care provider. Water from ice chips you suck on. Fruit juice with water you add to it (diluted). Sports drinks. Hot or cold herbal teas. Broth-based soups. Milk or milk products. Food Follow instructions from your health care provider about what to eat while you rehydrate. Your health care provider may recommend that you slowly begin eating regular foods in small amounts. Eat foods that contain a healthy balance of electrolytes, such as bananas, oranges, potatoes, tomatoes, and spinach. Avoid foods that are greasy or contain a lot of sugar. In some cases, you may get nutrition through a feeding tube that is passed through your nose and into your stomach (nasogastric tube, or NG tube). This may be done if you have uncontrolled vomiting or diarrhea. Beverages to avoid  Certain beverages may make dehydration worse. While you rehydrate, avoid drinking alcohol. How to tell if you are recovering from dehydration You may be recovering from dehydration if: You are urinating more often than before you started rehydrating. Your urine is pale yellow. Your energy level improves. You vomit less frequently. You have diarrhea less frequently. Your appetite improves or returns to normal. You feel less dizzy or less light-headed.   Your skin tone and color start to look more normal. Follow these instructions at home: Take over-the-counter and prescription medicines only as told by your health care provider. Do not take sodium  tablets. Doing this can lead to having too much sodium in your body (hypernatremia). Contact a health care provider if: You continue to have symptoms of mild or moderate dehydration, such as: Thirst. Dry lips. Slightly dry mouth. Dizziness. Dark urine or less urine than normal. Muscle cramps. You continue to vomit or have diarrhea. Get help right away if you: Have symptoms of dehydration that get worse. Have a fever. Have a severe headache. Have been vomiting and the following happens: Your vomiting gets worse or does not go away. Your vomit includes blood or green matter (bile). You cannot eat or drink without vomiting. Have problems with urination or bowel movements, such as: Diarrhea that gets worse or does not go away. Blood in your stool (feces). This may cause stool to look black and tarry. Not urinating, or urinating only a small amount of very dark urine, within 6-8 hours. Have trouble breathing. Have symptoms that get worse with treatment. These symptoms may represent a serious problem that is an emergency. Do not wait to see if the symptoms will go away. Get medical help right away. Call your local emergency services (911 in the [place] Do not drive yourself to the hospital. Summary Rehydration is the replacement of body fluids and minerals (electrolytes) that are lost during dehydration. Follow instructions from your health care provider for rehydration. The kind of fluid and amount you should drink depend on your condition. Slowly increase how much you drink until you have taken the amount recommended by your health care provider. Contact your health care provider if you continue to show signs of mild or moderate dehydration. This information is not intended to replace advice given to you by your health care provider. Make sure you discuss any questions you have with your health care provider. Document Revised: 01/04/2020 Document Reviewed: 11/14/2019 Elsevier Patient  Education  2023 [employer]  

## 2022-04-01 ENCOUNTER — Other Ambulatory Visit: Payer: Self-pay | Admitting: *Deleted

## 2022-04-01 DIAGNOSIS — C20 Malignant neoplasm of rectum: Secondary | ICD-10-CM

## 2022-04-01 NOTE — Progress Notes (Signed)
Fluid orders for 5/17 placed. ?

## 2022-04-02 ENCOUNTER — Other Ambulatory Visit: Payer: Self-pay | Admitting: *Deleted

## 2022-04-02 ENCOUNTER — Inpatient Hospital Stay: Payer: BC Managed Care – PPO

## 2022-04-02 ENCOUNTER — Other Ambulatory Visit: Payer: Self-pay

## 2022-04-02 DIAGNOSIS — I10 Essential (primary) hypertension: Secondary | ICD-10-CM | POA: Diagnosis not present

## 2022-04-02 DIAGNOSIS — Z5189 Encounter for other specified aftercare: Secondary | ICD-10-CM | POA: Diagnosis not present

## 2022-04-02 DIAGNOSIS — D701 Agranulocytosis secondary to cancer chemotherapy: Secondary | ICD-10-CM | POA: Diagnosis not present

## 2022-04-02 DIAGNOSIS — M109 Gout, unspecified: Secondary | ICD-10-CM | POA: Diagnosis not present

## 2022-04-02 DIAGNOSIS — L409 Psoriasis, unspecified: Secondary | ICD-10-CM | POA: Diagnosis not present

## 2022-04-02 DIAGNOSIS — C20 Malignant neoplasm of rectum: Secondary | ICD-10-CM | POA: Diagnosis not present

## 2022-04-02 DIAGNOSIS — T451X5A Adverse effect of antineoplastic and immunosuppressive drugs, initial encounter: Secondary | ICD-10-CM | POA: Diagnosis not present

## 2022-04-02 DIAGNOSIS — Z5111 Encounter for antineoplastic chemotherapy: Secondary | ICD-10-CM | POA: Diagnosis not present

## 2022-04-02 DIAGNOSIS — Z923 Personal history of irradiation: Secondary | ICD-10-CM | POA: Diagnosis not present

## 2022-04-02 DIAGNOSIS — E860 Dehydration: Secondary | ICD-10-CM | POA: Diagnosis not present

## 2022-04-02 DIAGNOSIS — R531 Weakness: Secondary | ICD-10-CM | POA: Diagnosis not present

## 2022-04-02 MED ORDER — HEPARIN SOD (PORK) LOCK FLUSH 100 UNIT/ML IV SOLN
500.0000 [IU] | Freq: Once | INTRAVENOUS | Status: AC
  Administered 2022-04-02: 500 [IU] via INTRAVENOUS

## 2022-04-02 MED ORDER — SODIUM CHLORIDE 0.9% FLUSH
10.0000 mL | Freq: Once | INTRAVENOUS | Status: AC
  Administered 2022-04-02: 10 mL via INTRAVENOUS

## 2022-04-02 MED ORDER — SODIUM CHLORIDE 0.9 % IV SOLN: INTRAVENOUS | Status: DC

## 2022-04-02 MED ORDER — LIDOCAINE-PRILOCAINE 2.5-2.5 % EX CREA: 1.0000 "application " | TOPICAL_CREAM | CUTANEOUS | 0 refills | Status: DC | PRN

## 2022-04-02 NOTE — Progress Notes (Signed)
IVF orders placed for 04/04/22. ?

## 2022-04-02 NOTE — Patient Instructions (Signed)
Rehydration, Adult Rehydration is the replacement of body fluids, salts, and minerals (electrolytes) that are lost during dehydration. Dehydration is when there is not enough water or other fluids in the body. This happens when you lose more fluids than you take in. Common causes of dehydration include: Not drinking enough fluids. This can occur when you are ill or doing activities that require a lot of energy, especially in hot weather. Conditions that cause loss of water or other fluids, such as diarrhea, vomiting, sweating, or urinating a lot. Other illnesses, such as fever or infection. Certain medicines, such as those that remove excess fluid from the body (diuretics). Symptoms of mild or moderate dehydration may include thirst, dry lips and mouth, and dizziness. Symptoms of severe dehydration may include increased heart rate, confusion, fainting, and not urinating. For severe dehydration, you may need to get fluids through an IV at the hospital. For mild or moderate dehydration, you can usually rehydrate at home by drinking certain fluids as told by your health care provider. What are the risks? Generally, rehydration is safe. However, taking in too much fluid (overhydration) can be a problem. This is rare. Overhydration can cause an electrolyte imbalance, kidney failure, or a decrease in salt (sodium) levels in the body. Supplies needed You will need an oral rehydration solution (ORS) if your health care provider tells you to use one. This is a drink to treat dehydration. It can be found in pharmacies and retail stores. How to rehydrate Fluids Follow instructions from your health care provider for rehydration. The kind of fluid and the amount you should drink depend on your condition. In general, you should choose drinks that you prefer. If told by your health care provider, drink an ORS. Make an ORS by following instructions on the package. Start by drinking small amounts, about  cup (120  mL) every 5-10 minutes. Slowly increase how much you drink until you have taken the amount recommended by your health care provider. Drink enough clear fluids to keep your urine pale yellow. If you were told to drink an ORS, finish it first, then start slowly drinking other clear fluids. Drink fluids such as: Water. This includes sparkling water and flavored water. Drinking only water can lead to having too little sodium in your body (hyponatremia). Follow the advice of your health care provider. Water from ice chips you suck on. Fruit juice with water you add to it (diluted). Sports drinks. Hot or cold herbal teas. Broth-based soups. Milk or milk products. Food Follow instructions from your health care provider about what to eat while you rehydrate. Your health care provider may recommend that you slowly begin eating regular foods in small amounts. Eat foods that contain a healthy balance of electrolytes, such as bananas, oranges, potatoes, tomatoes, and spinach. Avoid foods that are greasy or contain a lot of sugar. In some cases, you may get nutrition through a feeding tube that is passed through your nose and into your stomach (nasogastric tube, or NG tube). This may be done if you have uncontrolled vomiting or diarrhea. Beverages to avoid  Certain beverages may make dehydration worse. While you rehydrate, avoid drinking alcohol. How to tell if you are recovering from dehydration You may be recovering from dehydration if: You are urinating more often than before you started rehydrating. Your urine is pale yellow. Your energy level improves. You vomit less frequently. You have diarrhea less frequently. Your appetite improves or returns to normal. You feel less dizzy or less light-headed.   Your skin tone and color start to look more normal. Follow these instructions at home: Take over-the-counter and prescription medicines only as told by your health care provider. Do not take sodium  tablets. Doing this can lead to having too much sodium in your body (hypernatremia). Contact a health care provider if: You continue to have symptoms of mild or moderate dehydration, such as: Thirst. Dry lips. Slightly dry mouth. Dizziness. Dark urine or less urine than normal. Muscle cramps. You continue to vomit or have diarrhea. Get help right away if you: Have symptoms of dehydration that get worse. Have a fever. Have a severe headache. Have been vomiting and the following happens: Your vomiting gets worse or does not go away. Your vomit includes blood or green matter (bile). You cannot eat or drink without vomiting. Have problems with urination or bowel movements, such as: Diarrhea that gets worse or does not go away. Blood in your stool (feces). This may cause stool to look black and tarry. Not urinating, or urinating only a small amount of very dark urine, within 6-8 hours. Have trouble breathing. Have symptoms that get worse with treatment. These symptoms may represent a serious problem that is an emergency. Do not wait to see if the symptoms will go away. Get medical help right away. Call your local emergency services (911 in the [place] Do not drive yourself to the hospital. Summary Rehydration is the replacement of body fluids and minerals (electrolytes) that are lost during dehydration. Follow instructions from your health care provider for rehydration. The kind of fluid and amount you should drink depend on your condition. Slowly increase how much you drink until you have taken the amount recommended by your health care provider. Contact your health care provider if you continue to show signs of mild or moderate dehydration. This information is not intended to replace advice given to you by your health care provider. Make sure you discuss any questions you have with your health care provider. Document Revised: 01/04/2020 Document Reviewed: 11/14/2019 Elsevier Patient  Education  2023 [employer]  

## 2022-04-04 ENCOUNTER — Inpatient Hospital Stay: Payer: BC Managed Care – PPO

## 2022-04-04 DIAGNOSIS — Z5111 Encounter for antineoplastic chemotherapy: Secondary | ICD-10-CM | POA: Diagnosis not present

## 2022-04-04 DIAGNOSIS — C20 Malignant neoplasm of rectum: Secondary | ICD-10-CM | POA: Diagnosis not present

## 2022-04-04 DIAGNOSIS — D701 Agranulocytosis secondary to cancer chemotherapy: Secondary | ICD-10-CM | POA: Diagnosis not present

## 2022-04-04 DIAGNOSIS — M109 Gout, unspecified: Secondary | ICD-10-CM | POA: Diagnosis not present

## 2022-04-04 DIAGNOSIS — E860 Dehydration: Secondary | ICD-10-CM | POA: Diagnosis not present

## 2022-04-04 DIAGNOSIS — Z5189 Encounter for other specified aftercare: Secondary | ICD-10-CM | POA: Diagnosis not present

## 2022-04-04 DIAGNOSIS — T451X5A Adverse effect of antineoplastic and immunosuppressive drugs, initial encounter: Secondary | ICD-10-CM | POA: Diagnosis not present

## 2022-04-04 DIAGNOSIS — L409 Psoriasis, unspecified: Secondary | ICD-10-CM | POA: Diagnosis not present

## 2022-04-04 DIAGNOSIS — I10 Essential (primary) hypertension: Secondary | ICD-10-CM | POA: Diagnosis not present

## 2022-04-04 DIAGNOSIS — R531 Weakness: Secondary | ICD-10-CM | POA: Diagnosis not present

## 2022-04-04 DIAGNOSIS — Z923 Personal history of irradiation: Secondary | ICD-10-CM | POA: Diagnosis not present

## 2022-04-04 MED ORDER — HEPARIN SOD (PORK) LOCK FLUSH 100 UNIT/ML IV SOLN
500.0000 [IU] | Freq: Once | INTRAVENOUS | Status: AC
  Administered 2022-04-04: 500 [IU] via INTRAVENOUS

## 2022-04-04 MED ORDER — SODIUM CHLORIDE 0.9% FLUSH
10.0000 mL | Freq: Once | INTRAVENOUS | Status: AC
  Administered 2022-04-04: 10 mL via INTRAVENOUS

## 2022-04-04 MED ORDER — SODIUM CHLORIDE 0.9 % IV SOLN: INTRAVENOUS | Status: AC

## 2022-04-04 NOTE — Patient Instructions (Signed)
Rehydration, Adult Rehydration is the replacement of body fluids, salts, and minerals (electrolytes) that are lost during dehydration. Dehydration is when there is not enough water or other fluids in the body. This happens when you lose more fluids than you take in. Common causes of dehydration include: Not drinking enough fluids. This can occur when you are ill or doing activities that require a lot of energy, especially in hot weather. Conditions that cause loss of water or other fluids, such as diarrhea, vomiting, sweating, or urinating a lot. Other illnesses, such as fever or infection. Certain medicines, such as those that remove excess fluid from the body (diuretics). Symptoms of mild or moderate dehydration may include thirst, dry lips and mouth, and dizziness. Symptoms of severe dehydration may include increased heart rate, confusion, fainting, and not urinating. For severe dehydration, you may need to get fluids through an IV at the hospital. For mild or moderate dehydration, you can usually rehydrate at home by drinking certain fluids as told by your health care provider. What are the risks? Generally, rehydration is safe. However, taking in too much fluid (overhydration) can be a problem. This is rare. Overhydration can cause an electrolyte imbalance, kidney failure, or a decrease in salt (sodium) levels in the body. Supplies needed You will need an oral rehydration solution (ORS) if your health care provider tells you to use one. This is a drink to treat dehydration. It can be found in pharmacies and retail stores. How to rehydrate Fluids Follow instructions from your health care provider for rehydration. The kind of fluid and the amount you should drink depend on your condition. In general, you should choose drinks that you prefer. If told by your health care provider, drink an ORS. Make an ORS by following instructions on the package. Start by drinking small amounts, about  cup (120  mL) every 5-10 minutes. Slowly increase how much you drink until you have taken the amount recommended by your health care provider. Drink enough clear fluids to keep your urine pale yellow. If you were told to drink an ORS, finish it first, then start slowly drinking other clear fluids. Drink fluids such as: Water. This includes sparkling water and flavored water. Drinking only water can lead to having too little sodium in your body (hyponatremia). Follow the advice of your health care provider. Water from ice chips you suck on. Fruit juice with water you add to it (diluted). Sports drinks. Hot or cold herbal teas. Broth-based soups. Milk or milk products. Food Follow instructions from your health care provider about what to eat while you rehydrate. Your health care provider may recommend that you slowly begin eating regular foods in small amounts. Eat foods that contain a healthy balance of electrolytes, such as bananas, oranges, potatoes, tomatoes, and spinach. Avoid foods that are greasy or contain a lot of sugar. In some cases, you may get nutrition through a feeding tube that is passed through your nose and into your stomach (nasogastric tube, or NG tube). This may be done if you have uncontrolled vomiting or diarrhea. Beverages to avoid  Certain beverages may make dehydration worse. While you rehydrate, avoid drinking alcohol. How to tell if you are recovering from dehydration You may be recovering from dehydration if: You are urinating more often than before you started rehydrating. Your urine is pale yellow. Your energy level improves. You vomit less frequently. You have diarrhea less frequently. Your appetite improves or returns to normal. You feel less dizzy or less light-headed.   Your skin tone and color start to look more normal. Follow these instructions at home: Take over-the-counter and prescription medicines only as told by your health care provider. Do not take sodium  tablets. Doing this can lead to having too much sodium in your body (hypernatremia). Contact a health care provider if: You continue to have symptoms of mild or moderate dehydration, such as: Thirst. Dry lips. Slightly dry mouth. Dizziness. Dark urine or less urine than normal. Muscle cramps. You continue to vomit or have diarrhea. Get help right away if you: Have symptoms of dehydration that get worse. Have a fever. Have a severe headache. Have been vomiting and the following happens: Your vomiting gets worse or does not go away. Your vomit includes blood or green matter (bile). You cannot eat or drink without vomiting. Have problems with urination or bowel movements, such as: Diarrhea that gets worse or does not go away. Blood in your stool (feces). This may cause stool to look black and tarry. Not urinating, or urinating only a small amount of very dark urine, within 6-8 hours. Have trouble breathing. Have symptoms that get worse with treatment. These symptoms may represent a serious problem that is an emergency. Do not wait to see if the symptoms will go away. Get medical help right away. Call your local emergency services (911 in the [place] Do not drive yourself to the hospital. Summary Rehydration is the replacement of body fluids and minerals (electrolytes) that are lost during dehydration. Follow instructions from your health care provider for rehydration. The kind of fluid and amount you should drink depend on your condition. Slowly increase how much you drink until you have taken the amount recommended by your health care provider. Contact your health care provider if you continue to show signs of mild or moderate dehydration. This information is not intended to replace advice given to you by your health care provider. Make sure you discuss any questions you have with your health care provider. Document Revised: 01/04/2020 Document Reviewed: 11/14/2019 Elsevier Patient  Education  2023 [employer]  

## 2022-04-07 ENCOUNTER — Inpatient Hospital Stay: Payer: BC Managed Care – PPO

## 2022-04-07 ENCOUNTER — Encounter: Payer: Self-pay | Admitting: *Deleted

## 2022-04-07 ENCOUNTER — Encounter: Payer: Self-pay | Admitting: Nurse Practitioner

## 2022-04-07 ENCOUNTER — Inpatient Hospital Stay: Payer: BC Managed Care – PPO | Admitting: Nurse Practitioner

## 2022-04-07 ENCOUNTER — Encounter: Payer: Self-pay | Admitting: Oncology

## 2022-04-07 ENCOUNTER — Other Ambulatory Visit (HOSPITAL_BASED_OUTPATIENT_CLINIC_OR_DEPARTMENT_OTHER): Payer: Self-pay

## 2022-04-07 VITALS — BP 119/80 | HR 74

## 2022-04-07 VITALS — BP 115/81 | HR 87 | Temp 98.2°F | Resp 18 | Ht 71.0 in | Wt 190.6 lb

## 2022-04-07 DIAGNOSIS — T451X5A Adverse effect of antineoplastic and immunosuppressive drugs, initial encounter: Secondary | ICD-10-CM | POA: Diagnosis not present

## 2022-04-07 DIAGNOSIS — C20 Malignant neoplasm of rectum: Secondary | ICD-10-CM

## 2022-04-07 DIAGNOSIS — M109 Gout, unspecified: Secondary | ICD-10-CM | POA: Diagnosis not present

## 2022-04-07 DIAGNOSIS — I10 Essential (primary) hypertension: Secondary | ICD-10-CM | POA: Diagnosis not present

## 2022-04-07 DIAGNOSIS — R531 Weakness: Secondary | ICD-10-CM | POA: Diagnosis not present

## 2022-04-07 DIAGNOSIS — E860 Dehydration: Secondary | ICD-10-CM | POA: Diagnosis not present

## 2022-04-07 DIAGNOSIS — Z923 Personal history of irradiation: Secondary | ICD-10-CM | POA: Diagnosis not present

## 2022-04-07 DIAGNOSIS — D701 Agranulocytosis secondary to cancer chemotherapy: Secondary | ICD-10-CM | POA: Diagnosis not present

## 2022-04-07 DIAGNOSIS — Z5111 Encounter for antineoplastic chemotherapy: Secondary | ICD-10-CM | POA: Diagnosis not present

## 2022-04-07 DIAGNOSIS — L409 Psoriasis, unspecified: Secondary | ICD-10-CM | POA: Diagnosis not present

## 2022-04-07 DIAGNOSIS — Z5189 Encounter for other specified aftercare: Secondary | ICD-10-CM | POA: Diagnosis not present

## 2022-04-07 LAB — CBC WITH DIFFERENTIAL (CANCER CENTER ONLY)
Abs Immature Granulocytes: 0.05 10*3/uL (ref 0.00–0.07)
Basophils Absolute: 0.1 10*3/uL (ref 0.0–0.1)
Basophils Relative: 1 %
Eosinophils Absolute: 0.1 10*3/uL (ref 0.0–0.5)
Eosinophils Relative: 2 %
HCT: 38.2 % — ABNORMAL LOW (ref 39.0–52.0)
Hemoglobin: 12.6 g/dL — ABNORMAL LOW (ref 13.0–17.0)
Immature Granulocytes: 1 %
Lymphocytes Relative: 19 %
Lymphs Abs: 1.5 10*3/uL (ref 0.7–4.0)
MCH: 26.7 pg (ref 26.0–34.0)
MCHC: 33 g/dL (ref 30.0–36.0)
MCV: 80.9 fL (ref 80.0–100.0)
Monocytes Absolute: 0.5 10*3/uL (ref 0.1–1.0)
Monocytes Relative: 6 %
Neutro Abs: 5.9 10*3/uL (ref 1.7–7.7)
Neutrophils Relative %: 71 %
Platelet Count: 138 10*3/uL — ABNORMAL LOW (ref 150–400)
RBC: 4.72 MIL/uL (ref 4.22–5.81)
RDW: 22.6 % — ABNORMAL HIGH (ref 11.5–15.5)
WBC Count: 8.2 10*3/uL (ref 4.0–10.5)
nRBC: 0 % (ref 0.0–0.2)

## 2022-04-07 LAB — CMP (CANCER CENTER ONLY)
ALT: 19 U/L (ref 0–44)
AST: 26 U/L (ref 15–41)
Albumin: 4 g/dL (ref 3.5–5.0)
Alkaline Phosphatase: 122 U/L (ref 38–126)
Anion gap: 10 (ref 5–15)
BUN: 18 mg/dL (ref 6–20)
CO2: 25 mmol/L (ref 22–32)
Calcium: 9.5 mg/dL (ref 8.9–10.3)
Chloride: 101 mmol/L (ref 98–111)
Creatinine: 1.5 mg/dL — ABNORMAL HIGH (ref 0.61–1.24)
GFR, Estimated: 53 mL/min — ABNORMAL LOW (ref >60)
Glucose, Bld: 133 mg/dL — ABNORMAL HIGH (ref 70–99)
Potassium: 4 mmol/L (ref 3.5–5.1)
Sodium: 136 mmol/L (ref 135–145)
Total Bilirubin: 0.4 mg/dL (ref 0.3–1.2)
Total Protein: 6.9 g/dL (ref 6.5–8.1)

## 2022-04-07 LAB — MAGNESIUM: Magnesium: 1.9 mg/dL (ref 1.7–2.4)

## 2022-04-07 MED ORDER — DEXTROSE 5 % IV SOLN: Freq: Once | INTRAVENOUS | Status: AC

## 2022-04-07 MED ORDER — PALONOSETRON HCL INJECTION 0.25 MG/5ML
0.2500 mg | Freq: Once | INTRAVENOUS | Status: AC
  Administered 2022-04-07: 0.25 mg via INTRAVENOUS
  Filled 2022-04-07: qty 5

## 2022-04-07 MED ORDER — OXALIPLATIN CHEMO INJECTION 100 MG/20ML
85.0000 mg/m2 | Freq: Once | INTRAVENOUS | Status: AC
  Administered 2022-04-07: 180 mg via INTRAVENOUS
  Filled 2022-04-07: qty 36

## 2022-04-07 MED ORDER — SODIUM CHLORIDE 0.9 % IV SOLN
10.0000 mg | Freq: Once | INTRAVENOUS | Status: AC
  Administered 2022-04-07: 10 mg via INTRAVENOUS
  Filled 2022-04-07: qty 1

## 2022-04-07 MED ORDER — DIPHENOXYLATE-ATROPINE 2.5-0.025 MG PO TABS
ORAL_TABLET | ORAL | 0 refills | Status: DC
  Filled 2022-04-07: qty 75, 9d supply, fill #0

## 2022-04-07 MED ORDER — LEUCOVORIN CALCIUM INJECTION 350 MG
400.0000 mg/m2 | Freq: Once | INTRAVENOUS | Status: AC
  Administered 2022-04-07: 840 mg via INTRAVENOUS
  Filled 2022-04-07: qty 42

## 2022-04-07 MED ORDER — SODIUM CHLORIDE 0.9 % IV SOLN
5000.0000 mg | INTRAVENOUS | Status: DC
  Administered 2022-04-07: 5000 mg via INTRAVENOUS
  Filled 2022-04-07: qty 100

## 2022-04-07 MED ORDER — FLUOROURACIL CHEMO INJECTION 2.5 GM/50ML
400.0000 mg/m2 | Freq: Once | INTRAVENOUS | Status: AC
  Administered 2022-04-07: 850 mg via INTRAVENOUS
  Filled 2022-04-07: qty 17

## 2022-04-07 NOTE — Patient Instructions (Signed)

## 2022-04-07 NOTE — Progress Notes (Signed)
  [facility] OFFICE PROGRESS NOTE   Diagnosis: Rectal cancer  INTERVAL HISTORY:   Kenneth [name] returns as scheduled.  He has completed 3 cycles of FOLFOX.  Cycle 4 was held 03/31/2022 due to dehydration.  He received IV fluids on 03/31/2022, 04/02/2022 and 04/04/2022.  He is feeling much better.  He is taking 8 Lomotil a day to keep the ileostomy output under control.  He continues to push fluids by mouth.  No nausea or vomiting.  No mouth sores.  Objective:  Vital signs in last 24 hours:  Blood pressure 115/81, pulse 87, temperature 98.2 F (36.8 C), temperature source Oral, resp. rate 18, height _0  (1.803 m), weight 190 lb 9.6 oz (86.5 kg), SpO2 99 %.    HEENT: No thrush or ulcers. Resp: Lungs clear bilaterally. Cardio: Regular rate and rhythm. GI: Abdomen soft and nontender.  No hepatomegaly. Vascular: No leg edema. Skin: Skin turgor intact. Port-A-Cath without erythema.   Lab Results:  Lab Results  Component Value Date   WBC 8.2 04/07/2022   HGB 12.6 (L) 04/07/2022   HCT 38.2 (L) 04/07/2022   MCV 80.9 04/07/2022   PLT 138 (L) 04/07/2022   NEUTROABS 5.9 04/07/2022    Imaging:  No results found.  Medications: I have reviewed the patient's current medications.  Assessment/Plan: Rectal cancer-mass at 11 cm on proctoscopy by Dr. Morton Stall 02/04/2022 Colonoscopy 12/26/2021-obstructing mass at the rectosigmoid measured at 15 cm, biopsy invasive moderate to poorly differentiated adenocarcinoma, mismatch repair protein expression intact CTs 12/31/2021-masslike circumferential thickening of the distal sigmoid colon with mildly enlarged pericolonic lymph nodes, numerous subcentimeter hypodense liver lesions, splenomegaly MRI abdomen 01/07/2022-innumerable T2 hyperintense liver lesions consistent with cysts, spleen unremarkable, no ascites or adenopathy, moderate colonic fecal retention, no bowel obstruction MRI pelvis 01/22/2022-T3cN2 tumor above and below the  peritoneal reflection, tumor 11.3 cm from the anal verge and 6.1 cm from the sphincter complex, multiple enlarged perirectal nodes, 1.1 cm node versus tumor deposit at the right aspect of the tumor Diverting loop ileostomy 01/15/2022 Cycle 1 FOLFOX 02/17/2022 Cycle 2 FOLFOX 03/04/2022 Cycle 3 FOLFOX held on 03/17/2022 due to neutropenia Cycle 3 FOLFOX 03/17/2022, Udenyca Treatment held 03/31/2022 due to dehydration Cycle 4 FOLFOX 04/07/2022 Gout Psoriasis Hypertension Left scalene node versus cyst/lipoma on exam 02/03/2022-CT neck 02/10/2022 with 9.5 mm lymph node in the left supraclavicular region posterior to the sternocleidomastoid muscle.  No other prominent lymph nodes in the neck.  Biopsy 02/12/2022 compatible with benign lymph node, negative for metastatic carcinoma.      Disposition: [name] appears stable.  He has completed 3 cycles of FOLFOX.  Treatment was held last week due to dehydration.  Plan to proceed with cycle 4 today as scheduled.  He will receive IV fluids on Wednesday and Friday of this week and then 3 days next week.  He will continue Lomotil and Imodium for the high output ileostomy.  CBC and chemistry panel reviewed.  Labs adequate to proceed as above.  He will return for lab, follow-up, cycle 5 FOLFOX in 2 weeks.  We are available to see him sooner if needed.   [name] ANP/GNP-BC   04/07/2022  10:28 AM

## 2022-04-07 NOTE — Patient Instructions (Addendum)
[facility]  The chemotherapy medication bag should finish at 46 hours, 96 hours, or 7 days. For example, if your pump is scheduled for 46 hours and it was put on at 4:00 p.m., it should finish at 2:00 p.m. the day it is scheduled to come off regardless of your appointment time.     Estimated time to finish at 1:00 Wednesday, Apr 09, 2022.   If the display on your pump reads "Low Volume" and it is beeping, take the batteries out of the pump and come to the cancer center for it to be taken off.   If the pump alarms go off prior to the pump reading "Low Volume" then call [phone] and someone can assist you.  If the plunger comes out and the chemotherapy medication is leaking out, please use your home chemo spill kit to clean up the spill. Do NOT use paper towels or other household products.  If you have problems or questions regarding your pump, please call either [phone] (24 hours a day) or the cancer center Monday-Friday 8:00 a.m.- 4:30 p.m. at the clinic number and we will assist you. If you are unable to get assistance, then go to the nearest Emergency Department and ask the staff to contact the IV team for assistance.   Discharge Instructions: Thank you for choosing [facility] to provide your oncology and hematology care.   If you have a lab appointment with the [facility], please go directly to the [facility] and check in at the registration area.   Wear comfortable clothing and clothing appropriate for easy access to any Portacath or PICC line.   We strive to give you quality time with your provider. You may need to reschedule your appointment if you arrive late (15 or more minutes).  Arriving late affects you and other patients whose appointments are after yours.  Also, if you miss three or more appointments without notifying the office, you may be dismissed from the clinic at the provider's discretion.      For prescription  refill requests, have your pharmacy contact our office and allow 72 hours for refills to be completed.    Today you received the following chemotherapy and/or immunotherapy agents Oxaliplatin, Leucovorin, Fluorouracil.      To help prevent nausea and vomiting after your treatment, we encourage you to take your nausea medication as directed.  BELOW ARE SYMPTOMS THAT SHOULD BE REPORTED IMMEDIATELY: *FEVER GREATER THAN 100.4 F (38 C) OR HIGHER *CHILLS OR SWEATING *NAUSEA AND VOMITING THAT IS NOT CONTROLLED WITH YOUR NAUSEA MEDICATION *UNUSUAL SHORTNESS OF BREATH *UNUSUAL BRUISING OR BLEEDING *URINARY PROBLEMS (pain or burning when urinating, or frequent urination) *BOWEL PROBLEMS (unusual diarrhea, constipation, pain near the anus) TENDERNESS IN MOUTH AND THROAT WITH OR WITHOUT PRESENCE OF ULCERS (sore throat, sores in mouth, or a toothache) UNUSUAL RASH, SWELLING OR PAIN  UNUSUAL VAGINAL DISCHARGE OR ITCHING   Items with * indicate a potential emergency and should be followed up as soon as possible or go to the Emergency Department if any problems should occur.  Please show the CHEMOTHERAPY ALERT CARD or IMMUNOTHERAPY ALERT CARD at check-in to the Emergency Department and triage nurse.  Should you have questions after your visit or need to cancel or reschedule your appointment, please contact [facility]  Dept: [phone]  and follow the prompts.  Office hours are 8:00 a.m. to 4:30 p.m. Monday - Friday. Please note that voicemails  left after 4:00 p.m. may not be returned until the following business day.  We are closed weekends and major holidays. You have access to a nurse at all times for urgent questions. Please call the main number to the clinic Dept: [phone] and follow the prompts.   For any non-urgent questions, you may also contact your provider using MyChart. We now offer e-Visits for anyone 36 and older to request care online for non-urgent  symptoms. For details visit [email or web address].   Also download the MyChart app! Go to the app store, search "MyChart", open the app, select [facility], and log in with your MyChart username and password.  Due to Covid, a mask is required upon entering the hospital/clinic. If you do not have a mask, one will be given to you upon arrival. For doctor visits, patients may have 1 support person aged 2 or older with them. For treatment visits, patients cannot have anyone with them due to current Covid guidelines and our immunocompromised population.   Oxaliplatin Injection What is this medication? OXALIPLATIN (ox AL i PLA tin) is a chemotherapy drug. It targets fast dividing cells, like cancer cells, and causes these cells to die. This medicine is used to treat cancers of the colon and rectum, and many other cancers. This medicine may be used for other purposes; ask your health care provider or pharmacist if you have questions. COMMON BRAND NAME(S): Eloxatin What should I tell my care team before I take this medication? They need to know if you have any of these conditions: heart disease history of irregular heartbeat liver disease low blood counts, like white cells, platelets, or red blood cells lung or breathing disease, like asthma take medicines that treat or prevent blood clots tingling of the fingers or toes, or other nerve disorder an unusual or allergic reaction to oxaliplatin, other chemotherapy, other medicines, foods, dyes, or preservatives pregnant or trying to get pregnant breast-feeding How should I use this medication? This drug is given as an infusion into a vein. It is administered in a hospital or clinic by a specially trained health care professional. Talk to your pediatrician regarding the use of this medicine in children. Special care may be needed. Overdosage: If you think you have taken too much of this medicine contact a poison control center or emergency room at  once. NOTE: This medicine is only for you. Do not share this medicine with others. What if I miss a dose? It is important not to miss a dose. Call your doctor or health care professional if you are unable to keep an appointment. What may interact with this medication? Do not take this medicine with any of the following medications: cisapride dronedarone pimozide thioridazine This medicine may also interact with the following medications: aspirin and aspirin-like medicines certain medicines that treat or prevent blood clots like warfarin, apixaban, dabigatran, and rivaroxaban cisplatin cyclosporine diuretics medicines for infection like acyclovir, adefovir, amphotericin B, bacitracin, cidofovir, foscarnet, ganciclovir, gentamicin, pentamidine, vancomycin NSAIDs, medicines for pain and inflammation, like ibuprofen or naproxen other medicines that prolong the QT interval (an abnormal heart rhythm) pamidronate zoledronic acid This list may not describe all possible interactions. Give your health care provider a list of all the medicines, herbs, non-prescription drugs, or dietary supplements you use. Also tell them if you smoke, drink alcohol, or use illegal drugs. Some items may interact with your medicine. What should I watch for while using this medication? Your condition will be monitored carefully while you are receiving this  medicine. You may need blood work done while you are taking this medicine. This medicine may make you feel generally unwell. This is not uncommon as chemotherapy can affect healthy cells as well as cancer cells. Report any side effects. Continue your course of treatment even though you feel ill unless your healthcare professional tells you to stop. This medicine can make you more sensitive to cold. Do not drink cold drinks or use ice. Cover exposed skin before coming in contact with cold temperatures or cold objects. When out in cold weather wear warm clothing and  cover your mouth and nose to warm the air that goes into your lungs. Tell your doctor if you get sensitive to the cold. Do not become pregnant while taking this medicine or for 9 months after stopping it. Women should inform their health care professional if they wish to become pregnant or think they might be pregnant. Men should not father a child while taking this medicine and for 6 months after stopping it. There is potential for serious side effects to an unborn child. Talk to your health care professional for more information. Do not breast-feed a child while taking this medicine or for 3 months after stopping it. This medicine has caused ovarian failure in some women. This medicine may make it more difficult to get pregnant. Talk to your health care professional if you are concerned about your fertility. This medicine has caused decreased sperm counts in some men. This may make it more difficult to father a child. Talk to your health care professional if you are concerned about your fertility. This medicine may increase your risk of getting an infection. Call your health care professional for advice if you get a fever, chills, or sore throat, or other symptoms of a cold or flu. Do not treat yourself. Try to avoid being around people who are sick. Avoid taking medicines that contain aspirin, acetaminophen, ibuprofen, naproxen, or ketoprofen unless instructed by your health care professional. These medicines may hide a fever. Be careful brushing or flossing your teeth or using a toothpick because you may get an infection or bleed more easily. If you have any dental work done, tell your dentist you are receiving this medicine. What side effects may I notice from receiving this medication? Side effects that you should report to your doctor or health care professional as soon as possible: allergic reactions like skin rash, itching or hives, swelling of the face, lips, or tongue breathing  problems cough low blood counts - this medicine may decrease the number of white blood cells, red blood cells, and platelets. You may be at increased risk for infections and bleeding nausea, vomiting pain, redness, or irritation at site where injected pain, tingling, numbness in the hands or feet signs and symptoms of bleeding such as bloody or black, tarry stools; red or dark brown urine; spitting up blood or brown material that looks like coffee grounds; red spots on the skin; unusual bruising or bleeding from the eyes, gums, or nose signs and symptoms of a dangerous change in heartbeat or heart rhythm like chest pain; dizziness; fast, irregular heartbeat; palpitations; feeling faint or lightheaded; falls signs and symptoms of infection like fever; chills; cough; sore throat; pain or trouble passing urine signs and symptoms of liver injury like dark yellow or brown urine; general ill feeling or flu-like symptoms; light-colored stools; loss of appetite; nausea; right upper belly pain; unusually weak or tired; yellowing of the eyes or skin signs and symptoms of  low red blood cells or anemia such as unusually weak or tired; feeling faint or lightheaded; falls signs and symptoms of muscle injury like dark urine; trouble passing urine or change in the amount of urine; unusually weak or tired; muscle pain; back pain Side effects that usually do not require medical attention (report to your doctor or health care professional if they continue or are bothersome): changes in taste diarrhea gas hair loss loss of appetite mouth sores This list may not describe all possible side effects. Call your doctor for medical advice about side effects. You may report side effects to FDA at 1-800-FDA-1088. Where should I keep my medication? This drug is given in a hospital or clinic and will not be stored at home. NOTE: This sheet is a summary. It may not cover all possible information. If you have questions about  this medicine, talk to your doctor, pharmacist, or health care provider.  2023 Elsevier/Gold Standard (2021-10-04 00:00:00)  Leucovorin injection What is this medication? LEUCOVORIN (loo koe VOR in) is used to prevent or treat the harmful effects of some medicines. This medicine is used to treat anemia caused by a low amount of folic acid in the body. It is also used with 5-fluorouracil (5-FU) to treat colon cancer. This medicine may be used for other purposes; ask your health care provider or pharmacist if you have questions. What should I tell my care team before I take this medication? They need to know if you have any of these conditions: anemia from low levels of vitamin B-12 in the blood an unusual or allergic reaction to leucovorin, folic acid, other medicines, foods, dyes, or preservatives pregnant or trying to get pregnant breast-feeding How should I use this medication? This medicine is for injection into a muscle or into a vein. It is given by a health care professional in a hospital or clinic setting. Talk to your pediatrician regarding the use of this medicine in children. Special care may be needed. Overdosage: If you think you have taken too much of this medicine contact a poison control center or emergency room at once. NOTE: This medicine is only for you. Do not share this medicine with others. What if I miss a dose? This does not apply. What may interact with this medication? capecitabine fluorouracil phenobarbital phenytoin primidone trimethoprim-sulfamethoxazole This list may not describe all possible interactions. Give your health care provider a list of all the medicines, herbs, non-prescription drugs, or dietary supplements you use. Also tell them if you smoke, drink alcohol, or use illegal drugs. Some items may interact with your medicine. What should I watch for while using this medication? Your condition will be monitored carefully while you are receiving this  medicine. This medicine may increase the side effects of 5-fluorouracil, 5-FU. Tell your doctor or health care professional if you have diarrhea or mouth sores that do not get better or that get worse. What side effects may I notice from receiving this medication? Side effects that you should report to your doctor or health care professional as soon as possible: allergic reactions like skin rash, itching or hives, swelling of the face, lips, or tongue breathing problems fever, infection mouth sores unusual bleeding or bruising unusually weak or tired Side effects that usually do not require medical attention (report to your doctor or health care professional if they continue or are bothersome): constipation or diarrhea loss of appetite nausea, vomiting This list may not describe all possible side effects. Call your doctor for medical  advice about side effects. You may report side effects to FDA at 1-800-FDA-1088. Where should I keep my medication? This drug is given in a hospital or clinic and will not be stored at home. NOTE: This sheet is a summary. It may not cover all possible information. If you have questions about this medicine, talk to your doctor, pharmacist, or health care provider.  2023 Elsevier/Gold Standard (2008-05-11 00:00:00)  Fluorouracil, 5-FU injection What is this medication? FLUOROURACIL, 5-FU (flure oh YOOR a sil) is a chemotherapy drug. It slows the growth of cancer cells. This medicine is used to treat many types of cancer like breast cancer, colon or rectal cancer, pancreatic cancer, and stomach cancer. This medicine may be used for other purposes; ask your health care provider or pharmacist if you have questions. COMMON BRAND NAME(S): Adrucil What should I tell my care team before I take this medication? They need to know if you have any of these conditions: blood disorders dihydropyrimidine dehydrogenase (DPD) deficiency infection (especially a virus  infection such as chickenpox, cold sores, or herpes) kidney disease liver disease malnourished, poor nutrition recent or ongoing radiation therapy an unusual or allergic reaction to fluorouracil, other chemotherapy, other medicines, foods, dyes, or preservatives pregnant or trying to get pregnant breast-feeding How should I use this medication? This drug is given as an infusion or injection into a vein. It is administered in a hospital or clinic by a specially trained health care professional. Talk to your pediatrician regarding the use of this medicine in children. Special care may be needed. Overdosage: If you think you have taken too much of this medicine contact a poison control center or emergency room at once. NOTE: This medicine is only for you. Do not share this medicine with others. What if I miss a dose? It is important not to miss your dose. Call your doctor or health care professional if you are unable to keep an appointment. What may interact with this medication? Do not take this medicine with any of the following medications: live virus vaccines This medicine may also interact with the following medications: medicines that treat or prevent blood clots like warfarin, enoxaparin, and dalteparin This list may not describe all possible interactions. Give your health care provider a list of all the medicines, herbs, non-prescription drugs, or dietary supplements you use. Also tell them if you smoke, drink alcohol, or use illegal drugs. Some items may interact with your medicine. What should I watch for while using this medication? Visit your doctor for checks on your progress. This drug may make you feel generally unwell. This is not uncommon, as chemotherapy can affect healthy cells as well as cancer cells. Report any side effects. Continue your course of treatment even though you feel ill unless your doctor tells you to stop. In some cases, you may be given additional medicines to  help with side effects. Follow all directions for their use. Call your doctor or health care professional for advice if you get a fever, chills or sore throat, or other symptoms of a cold or flu. Do not treat yourself. This drug decreases your body's ability to fight infections. Try to avoid being around people who are sick. This medicine may increase your risk to bruise or bleed. Call your doctor or health care professional if you notice any unusual bleeding. Be careful brushing and flossing your teeth or using a toothpick because you may get an infection or bleed more easily. If you have any dental work done,  tell your dentist you are receiving this medicine. Avoid taking products that contain aspirin, acetaminophen, ibuprofen, naproxen, or ketoprofen unless instructed by your doctor. These medicines may hide a fever. Do not become pregnant while taking this medicine. Women should inform their doctor if they wish to become pregnant or think they might be pregnant. There is a potential for serious side effects to an unborn child. Talk to your health care professional or pharmacist for more information. Do not breast-feed an infant while taking this medicine. Men should inform their doctor if they wish to father a child. This medicine may lower sperm counts. Do not treat diarrhea with over the counter products. Contact your doctor if you have diarrhea that lasts more than 2 days or if it is severe and watery. This medicine can make you more sensitive to the sun. Keep out of the sun. If you cannot avoid being in the sun, wear protective clothing and use sunscreen. Do not use sun lamps or tanning beds/booths. What side effects may I notice from receiving this medication? Side effects that you should report to your doctor or health care professional as soon as possible: allergic reactions like skin rash, itching or hives, swelling of the face, lips, or tongue low blood counts - this medicine may decrease  the number of white blood cells, red blood cells and platelets. You may be at increased risk for infections and bleeding. signs of infection - fever or chills, cough, sore throat, pain or difficulty passing urine signs of decreased platelets or bleeding - bruising, pinpoint red spots on the skin, black, tarry stools, blood in the urine signs of decreased red blood cells - unusually weak or tired, fainting spells, lightheadedness breathing problems changes in vision chest pain mouth sores nausea and vomiting pain, swelling, redness at site where injected pain, tingling, numbness in the hands or feet redness, swelling, or sores on hands or feet stomach pain unusual bleeding Side effects that usually do not require medical attention (report to your doctor or health care professional if they continue or are bothersome): changes in finger or toe nails diarrhea dry or itchy skin hair loss headache loss of appetite sensitivity of eyes to the light stomach upset unusually teary eyes This list may not describe all possible side effects. Call your doctor for medical advice about side effects. You may report side effects to FDA at 1-800-FDA-1088. Where should I keep my medication? This drug is given in a hospital or clinic and will not be stored at home. NOTE: This sheet is a summary. It may not cover all possible information. If you have questions about this medicine, talk to your doctor, pharmacist, or health care provider.  2023 Elsevier/Gold Standard (2021-10-04 00:00:00)

## 2022-04-07 NOTE — Progress Notes (Signed)
Patient seen by Lisa Thomas NP today  Vitals are within treatment parameters.  Labs reviewed by Lisa Thomas NP and are within treatment parameters.  Per physician team, patient is ready for treatment and there are NO modifications to the treatment plan.     

## 2022-04-08 ENCOUNTER — Other Ambulatory Visit: Payer: Self-pay | Admitting: *Deleted

## 2022-04-08 DIAGNOSIS — C20 Malignant neoplasm of rectum: Secondary | ICD-10-CM

## 2022-04-08 NOTE — Progress Notes (Signed)
IVF orders placed for 5/24 and 5/26

## 2022-04-09 ENCOUNTER — Inpatient Hospital Stay: Payer: BC Managed Care – PPO | Admitting: Nutrition

## 2022-04-09 ENCOUNTER — Inpatient Hospital Stay: Payer: BC Managed Care – PPO

## 2022-04-09 VITALS — BP 108/75 | HR 78 | Temp 97.9°F | Resp 20

## 2022-04-09 DIAGNOSIS — C20 Malignant neoplasm of rectum: Secondary | ICD-10-CM

## 2022-04-09 DIAGNOSIS — D701 Agranulocytosis secondary to cancer chemotherapy: Secondary | ICD-10-CM | POA: Diagnosis not present

## 2022-04-09 DIAGNOSIS — I10 Essential (primary) hypertension: Secondary | ICD-10-CM | POA: Diagnosis not present

## 2022-04-09 DIAGNOSIS — T451X5A Adverse effect of antineoplastic and immunosuppressive drugs, initial encounter: Secondary | ICD-10-CM | POA: Diagnosis not present

## 2022-04-09 DIAGNOSIS — M109 Gout, unspecified: Secondary | ICD-10-CM | POA: Diagnosis not present

## 2022-04-09 DIAGNOSIS — Z923 Personal history of irradiation: Secondary | ICD-10-CM | POA: Diagnosis not present

## 2022-04-09 DIAGNOSIS — E860 Dehydration: Secondary | ICD-10-CM | POA: Diagnosis not present

## 2022-04-09 DIAGNOSIS — R531 Weakness: Secondary | ICD-10-CM | POA: Diagnosis not present

## 2022-04-09 DIAGNOSIS — L409 Psoriasis, unspecified: Secondary | ICD-10-CM | POA: Diagnosis not present

## 2022-04-09 DIAGNOSIS — Z5189 Encounter for other specified aftercare: Secondary | ICD-10-CM | POA: Diagnosis not present

## 2022-04-09 DIAGNOSIS — Z5111 Encounter for antineoplastic chemotherapy: Secondary | ICD-10-CM | POA: Diagnosis not present

## 2022-04-09 MED ORDER — SODIUM CHLORIDE 0.9% FLUSH
10.0000 mL | INTRAVENOUS | Status: DC | PRN
  Administered 2022-04-09: 10 mL

## 2022-04-09 MED ORDER — SODIUM CHLORIDE 0.9 % IV SOLN: INTRAVENOUS | Status: AC

## 2022-04-09 MED ORDER — PEGFILGRASTIM-CBQV 6 MG/0.6ML ~~LOC~~ SOSY
6.0000 mg | PREFILLED_SYRINGE | Freq: Once | SUBCUTANEOUS | Status: AC
  Administered 2022-04-09: 6 mg via SUBCUTANEOUS
  Filled 2022-04-09: qty 0.6

## 2022-04-09 MED ORDER — HEPARIN SOD (PORK) LOCK FLUSH 100 UNIT/ML IV SOLN
500.0000 [IU] | Freq: Once | INTRAVENOUS | Status: AC | PRN
  Administered 2022-04-09: 500 [IU]

## 2022-04-09 NOTE — Progress Notes (Signed)
Nutrition follow up completed with patient during IVF. Patient receiving treatment for Rectal cancer S/P ileostomy.  Wt documented as 190 pounds 9.6 oz on May 22.  Labs noted: Glucose 133 and Creatinine 1.5  Patient feels much better now that he is getting frequent IVF due to high ileostomy output. States he drinks 90-100 oz fluids daily. States he takes 8 imodium and 8 lomotil daily. He eats about 4 meals daily and has been eating more pasta, bread, potatoes and crackers. Pleased with progress and feels he is getting control of his symptoms.  Nutrition Diagnosis: Unintended wt loss stable.  Intervention: Educated to continue increased fluids and small, frequent meals/snacks. Continue medications prescribed by MD. Provided samples of Bannatrol Plus to try to thicken stools.  Monitoring, Evaluation, Goals: Tolerate adequate calories and protein for wt maintenance.  Next Visit: Patient will contact RD for questions or concerns.

## 2022-04-09 NOTE — Patient Instructions (Signed)
Rehydration, Adult Rehydration is the replacement of body fluids, salts, and minerals (electrolytes) that are lost during dehydration. Dehydration is when there is not enough water or other fluids in the body. This happens when you lose more fluids than you take in. Common causes of dehydration include: Not drinking enough fluids. This can occur when you are ill or doing activities that require a lot of energy, especially in hot weather. Conditions that cause loss of water or other fluids, such as diarrhea, vomiting, sweating, or urinating a lot. Other illnesses, such as fever or infection. Certain medicines, such as those that remove excess fluid from the body (diuretics). Symptoms of mild or moderate dehydration may include thirst, dry lips and mouth, and dizziness. Symptoms of severe dehydration may include increased heart rate, confusion, fainting, and not urinating. For severe dehydration, you may need to get fluids through an IV at the hospital. For mild or moderate dehydration, you can usually rehydrate at home by drinking certain fluids as told by your health care provider. What are the risks? Generally, rehydration is safe. However, taking in too much fluid (overhydration) can be a problem. This is rare. Overhydration can cause an electrolyte imbalance, kidney failure, or a decrease in salt (sodium) levels in the body. Supplies needed You will need an oral rehydration solution (ORS) if your health care provider tells you to use one. This is a drink to treat dehydration. It can be found in pharmacies and retail stores. How to rehydrate Fluids Follow instructions from your health care provider for rehydration. The kind of fluid and the amount you should drink depend on your condition. In general, you should choose drinks that you prefer. If told by your health care provider, drink an ORS. Make an ORS by following instructions on the package. Start by drinking small amounts, about  cup (120  mL) every 5-10 minutes. Slowly increase how much you drink until you have taken the amount recommended by your health care provider. Drink enough clear fluids to keep your urine pale yellow. If you were told to drink an ORS, finish it first, then start slowly drinking other clear fluids. Drink fluids such as: Water. This includes sparkling water and flavored water. Drinking only water can lead to having too little sodium in your body (hyponatremia). Follow the advice of your health care provider. Water from ice chips you suck on. Fruit juice with water you add to it (diluted). Sports drinks. Hot or cold herbal teas. Broth-based soups. Milk or milk products. Food Follow instructions from your health care provider about what to eat while you rehydrate. Your health care provider may recommend that you slowly begin eating regular foods in small amounts. Eat foods that contain a healthy balance of electrolytes, such as bananas, oranges, potatoes, tomatoes, and spinach. Avoid foods that are greasy or contain a lot of sugar. In some cases, you may get nutrition through a feeding tube that is passed through your nose and into your stomach (nasogastric tube, or NG tube). This may be done if you have uncontrolled vomiting or diarrhea. Beverages to avoid  Certain beverages may make dehydration worse. While you rehydrate, avoid drinking alcohol. How to tell if you are recovering from dehydration You may be recovering from dehydration if: You are urinating more often than before you started rehydrating. Your urine is pale yellow. Your energy level improves. You vomit less frequently. You have diarrhea less frequently. Your appetite improves or returns to normal. You feel less dizzy or less light-headed.   Your skin tone and color start to look more normal. Follow these instructions at home: Take over-the-counter and prescription medicines only as told by your health care provider. Do not take sodium  tablets. Doing this can lead to having too much sodium in your body (hypernatremia). Contact a health care provider if: You continue to have symptoms of mild or moderate dehydration, such as: Thirst. Dry lips. Slightly dry mouth. Dizziness. Dark urine or less urine than normal. Muscle cramps. You continue to vomit or have diarrhea. Get help right away if you: Have symptoms of dehydration that get worse. Have a fever. Have a severe headache. Have been vomiting and the following happens: Your vomiting gets worse or does not go away. Your vomit includes blood or green matter (bile). You cannot eat or drink without vomiting. Have problems with urination or bowel movements, such as: Diarrhea that gets worse or does not go away. Blood in your stool (feces). This may cause stool to look black and tarry. Not urinating, or urinating only a small amount of very dark urine, within 6-8 hours. Have trouble breathing. Have symptoms that get worse with treatment. These symptoms may represent a serious problem that is an emergency. Do not wait to see if the symptoms will go away. Get medical help right away. Call your local emergency services (911 in the [place] Do not drive yourself to the hospital. Summary Rehydration is the replacement of body fluids and minerals (electrolytes) that are lost during dehydration. Follow instructions from your health care provider for rehydration. The kind of fluid and amount you should drink depend on your condition. Slowly increase how much you drink until you have taken the amount recommended by your health care provider. Contact your health care provider if you continue to show signs of mild or moderate dehydration. This information is not intended to replace advice given to you by your health care provider. Make sure you discuss any questions you have with your health care provider. Document Revised: 01/04/2020 Document Reviewed: 11/14/2019 Elsevier Patient  Education  2023 [employer]  

## 2022-04-11 ENCOUNTER — Other Ambulatory Visit: Payer: Self-pay | Admitting: *Deleted

## 2022-04-11 ENCOUNTER — Inpatient Hospital Stay: Payer: BC Managed Care – PPO

## 2022-04-11 VITALS — BP 117/83 | HR 72 | Temp 97.7°F | Resp 18

## 2022-04-11 DIAGNOSIS — Z5111 Encounter for antineoplastic chemotherapy: Secondary | ICD-10-CM | POA: Diagnosis not present

## 2022-04-11 DIAGNOSIS — C20 Malignant neoplasm of rectum: Secondary | ICD-10-CM | POA: Diagnosis not present

## 2022-04-11 DIAGNOSIS — Z95828 Presence of other vascular implants and grafts: Secondary | ICD-10-CM

## 2022-04-11 DIAGNOSIS — M109 Gout, unspecified: Secondary | ICD-10-CM | POA: Diagnosis not present

## 2022-04-11 DIAGNOSIS — Z5189 Encounter for other specified aftercare: Secondary | ICD-10-CM | POA: Diagnosis not present

## 2022-04-11 DIAGNOSIS — I10 Essential (primary) hypertension: Secondary | ICD-10-CM | POA: Diagnosis not present

## 2022-04-11 DIAGNOSIS — E860 Dehydration: Secondary | ICD-10-CM | POA: Diagnosis not present

## 2022-04-11 DIAGNOSIS — Z923 Personal history of irradiation: Secondary | ICD-10-CM | POA: Diagnosis not present

## 2022-04-11 DIAGNOSIS — R531 Weakness: Secondary | ICD-10-CM | POA: Diagnosis not present

## 2022-04-11 DIAGNOSIS — D701 Agranulocytosis secondary to cancer chemotherapy: Secondary | ICD-10-CM | POA: Diagnosis not present

## 2022-04-11 DIAGNOSIS — L409 Psoriasis, unspecified: Secondary | ICD-10-CM | POA: Diagnosis not present

## 2022-04-11 DIAGNOSIS — T451X5A Adverse effect of antineoplastic and immunosuppressive drugs, initial encounter: Secondary | ICD-10-CM | POA: Diagnosis not present

## 2022-04-11 MED ORDER — SODIUM CHLORIDE 0.9 % IV SOLN: INTRAVENOUS | Status: AC

## 2022-04-11 MED ORDER — SODIUM CHLORIDE 0.9% FLUSH
10.0000 mL | Freq: Once | INTRAVENOUS | Status: AC
  Administered 2022-04-11: 10 mL via INTRAVENOUS

## 2022-04-11 MED ORDER — HEPARIN SOD (PORK) LOCK FLUSH 100 UNIT/ML IV SOLN
500.0000 [IU] | Freq: Once | INTRAVENOUS | Status: AC
  Administered 2022-04-11: 500 [IU] via INTRAVENOUS

## 2022-04-11 NOTE — Patient Instructions (Signed)
Rehydration, Adult Rehydration is the replacement of body fluids, salts, and minerals (electrolytes) that are lost during dehydration. Dehydration is when there is not enough water or other fluids in the body. This happens when you lose more fluids than you take in. Common causes of dehydration include: Not drinking enough fluids. This can occur when you are ill or doing activities that require a lot of energy, especially in hot weather. Conditions that cause loss of water or other fluids, such as diarrhea, vomiting, sweating, or urinating a lot. Other illnesses, such as fever or infection. Certain medicines, such as those that remove excess fluid from the body (diuretics). Symptoms of mild or moderate dehydration may include thirst, dry lips and mouth, and dizziness. Symptoms of severe dehydration may include increased heart rate, confusion, fainting, and not urinating. For severe dehydration, you may need to get fluids through an IV at the hospital. For mild or moderate dehydration, you can usually rehydrate at home by drinking certain fluids as told by your health care provider. What are the risks? Generally, rehydration is safe. However, taking in too much fluid (overhydration) can be a problem. This is rare. Overhydration can cause an electrolyte imbalance, kidney failure, or a decrease in salt (sodium) levels in the body. Supplies needed You will need an oral rehydration solution (ORS) if your health care provider tells you to use one. This is a drink to treat dehydration. It can be found in pharmacies and retail stores. How to rehydrate Fluids Follow instructions from your health care provider for rehydration. The kind of fluid and the amount you should drink depend on your condition. In general, you should choose drinks that you prefer. If told by your health care provider, drink an ORS. Make an ORS by following instructions on the package. Start by drinking small amounts, about  cup (120  mL) every 5-10 minutes. Slowly increase how much you drink until you have taken the amount recommended by your health care provider. Drink enough clear fluids to keep your urine pale yellow. If you were told to drink an ORS, finish it first, then start slowly drinking other clear fluids. Drink fluids such as: Water. This includes sparkling water and flavored water. Drinking only water can lead to having too little sodium in your body (hyponatremia). Follow the advice of your health care provider. Water from ice chips you suck on. Fruit juice with water you add to it (diluted). Sports drinks. Hot or cold herbal teas. Broth-based soups. Milk or milk products. Food Follow instructions from your health care provider about what to eat while you rehydrate. Your health care provider may recommend that you slowly begin eating regular foods in small amounts. Eat foods that contain a healthy balance of electrolytes, such as bananas, oranges, potatoes, tomatoes, and spinach. Avoid foods that are greasy or contain a lot of sugar. In some cases, you may get nutrition through a feeding tube that is passed through your nose and into your stomach (nasogastric tube, or NG tube). This may be done if you have uncontrolled vomiting or diarrhea. Beverages to avoid  Certain beverages may make dehydration worse. While you rehydrate, avoid drinking alcohol. How to tell if you are recovering from dehydration You may be recovering from dehydration if: You are urinating more often than before you started rehydrating. Your urine is pale yellow. Your energy level improves. You vomit less frequently. You have diarrhea less frequently. Your appetite improves or returns to normal. You feel less dizzy or less light-headed.   Your skin tone and color start to look more normal. Follow these instructions at home: Take over-the-counter and prescription medicines only as told by your health care provider. Do not take sodium  tablets. Doing this can lead to having too much sodium in your body (hypernatremia). Contact a health care provider if: You continue to have symptoms of mild or moderate dehydration, such as: Thirst. Dry lips. Slightly dry mouth. Dizziness. Dark urine or less urine than normal. Muscle cramps. You continue to vomit or have diarrhea. Get help right away if you: Have symptoms of dehydration that get worse. Have a fever. Have a severe headache. Have been vomiting and the following happens: Your vomiting gets worse or does not go away. Your vomit includes blood or green matter (bile). You cannot eat or drink without vomiting. Have problems with urination or bowel movements, such as: Diarrhea that gets worse or does not go away. Blood in your stool (feces). This may cause stool to look black and tarry. Not urinating, or urinating only a small amount of very dark urine, within 6-8 hours. Have trouble breathing. Have symptoms that get worse with treatment. These symptoms may represent a serious problem that is an emergency. Do not wait to see if the symptoms will go away. Get medical help right away. Call your local emergency services (911 in the [place] Do not drive yourself to the hospital. Summary Rehydration is the replacement of body fluids and minerals (electrolytes) that are lost during dehydration. Follow instructions from your health care provider for rehydration. The kind of fluid and amount you should drink depend on your condition. Slowly increase how much you drink until you have taken the amount recommended by your health care provider. Contact your health care provider if you continue to show signs of mild or moderate dehydration. This information is not intended to replace advice given to you by your health care provider. Make sure you discuss any questions you have with your health care provider. Document Revised: 01/04/2020 Document Reviewed: 11/14/2019 Elsevier Patient  Education  2023 [employer]  

## 2022-04-11 NOTE — Progress Notes (Signed)
Fluid orders entered for 5/31 and 6/02

## 2022-04-15 ENCOUNTER — Inpatient Hospital Stay: Payer: BC Managed Care – PPO

## 2022-04-15 ENCOUNTER — Ambulatory Visit: Payer: BC Managed Care – PPO | Admitting: Nurse Practitioner

## 2022-04-15 ENCOUNTER — Other Ambulatory Visit: Payer: BC Managed Care – PPO

## 2022-04-15 ENCOUNTER — Telehealth: Payer: Self-pay

## 2022-04-15 ENCOUNTER — Ambulatory Visit: Payer: BC Managed Care – PPO

## 2022-04-15 VITALS — BP 120/79 | HR 67 | Temp 98.4°F | Resp 18 | Ht 71.0 in | Wt 190.4 lb

## 2022-04-15 DIAGNOSIS — Z5111 Encounter for antineoplastic chemotherapy: Secondary | ICD-10-CM | POA: Diagnosis not present

## 2022-04-15 DIAGNOSIS — C20 Malignant neoplasm of rectum: Secondary | ICD-10-CM | POA: Diagnosis not present

## 2022-04-15 DIAGNOSIS — Z5189 Encounter for other specified aftercare: Secondary | ICD-10-CM | POA: Diagnosis not present

## 2022-04-15 DIAGNOSIS — R531 Weakness: Secondary | ICD-10-CM | POA: Diagnosis not present

## 2022-04-15 DIAGNOSIS — I10 Essential (primary) hypertension: Secondary | ICD-10-CM | POA: Diagnosis not present

## 2022-04-15 DIAGNOSIS — T451X5A Adverse effect of antineoplastic and immunosuppressive drugs, initial encounter: Secondary | ICD-10-CM | POA: Diagnosis not present

## 2022-04-15 DIAGNOSIS — Z95828 Presence of other vascular implants and grafts: Secondary | ICD-10-CM

## 2022-04-15 DIAGNOSIS — E860 Dehydration: Secondary | ICD-10-CM | POA: Diagnosis not present

## 2022-04-15 DIAGNOSIS — M109 Gout, unspecified: Secondary | ICD-10-CM | POA: Diagnosis not present

## 2022-04-15 DIAGNOSIS — Z923 Personal history of irradiation: Secondary | ICD-10-CM | POA: Diagnosis not present

## 2022-04-15 DIAGNOSIS — L409 Psoriasis, unspecified: Secondary | ICD-10-CM | POA: Diagnosis not present

## 2022-04-15 DIAGNOSIS — D701 Agranulocytosis secondary to cancer chemotherapy: Secondary | ICD-10-CM | POA: Diagnosis not present

## 2022-04-15 MED ORDER — SODIUM CHLORIDE 0.9% FLUSH
10.0000 mL | Freq: Once | INTRAVENOUS | Status: AC
  Administered 2022-04-15: 10 mL via INTRAVENOUS

## 2022-04-15 MED ORDER — HEPARIN SOD (PORK) LOCK FLUSH 100 UNIT/ML IV SOLN
500.0000 [IU] | Freq: Once | INTRAVENOUS | Status: AC
  Administered 2022-04-15: 500 [IU] via INTRAVENOUS

## 2022-04-15 MED ORDER — SODIUM CHLORIDE 0.9 % IV SOLN: INTRAVENOUS | Status: DC

## 2022-04-15 NOTE — Patient Instructions (Signed)
Rehydration, Adult Rehydration is the replacement of body fluids, salts, and minerals (electrolytes) that are lost during dehydration. Dehydration is when there is not enough water or other fluids in the body. This happens when you lose more fluids than you take in. Common causes of dehydration include: Not drinking enough fluids. This can occur when you are ill or doing activities that require a lot of energy, especially in hot weather. Conditions that cause loss of water or other fluids, such as diarrhea, vomiting, sweating, or urinating a lot. Other illnesses, such as fever or infection. Certain medicines, such as those that remove excess fluid from the body (diuretics). Symptoms of mild or moderate dehydration may include thirst, dry lips and mouth, and dizziness. Symptoms of severe dehydration may include increased heart rate, confusion, fainting, and not urinating. For severe dehydration, you may need to get fluids through an IV at the hospital. For mild or moderate dehydration, you can usually rehydrate at home by drinking certain fluids as told by your health care provider. What are the risks? Generally, rehydration is safe. However, taking in too much fluid (overhydration) can be a problem. This is rare. Overhydration can cause an electrolyte imbalance, kidney failure, or a decrease in salt (sodium) levels in the body. Supplies needed You will need an oral rehydration solution (ORS) if your health care provider tells you to use one. This is a drink to treat dehydration. It can be found in pharmacies and retail stores. How to rehydrate Fluids Follow instructions from your health care provider for rehydration. The kind of fluid and the amount you should drink depend on your condition. In general, you should choose drinks that you prefer. If told by your health care provider, drink an ORS. Make an ORS by following instructions on the package. Start by drinking small amounts, about  cup (120  mL) every 5-10 minutes. Slowly increase how much you drink until you have taken the amount recommended by your health care provider. Drink enough clear fluids to keep your urine pale yellow. If you were told to drink an ORS, finish it first, then start slowly drinking other clear fluids. Drink fluids such as: Water. This includes sparkling water and flavored water. Drinking only water can lead to having too little sodium in your body (hyponatremia). Follow the advice of your health care provider. Water from ice chips you suck on. Fruit juice with water you add to it (diluted). Sports drinks. Hot or cold herbal teas. Broth-based soups. Milk or milk products. Food Follow instructions from your health care provider about what to eat while you rehydrate. Your health care provider may recommend that you slowly begin eating regular foods in small amounts. Eat foods that contain a healthy balance of electrolytes, such as bananas, oranges, potatoes, tomatoes, and spinach. Avoid foods that are greasy or contain a lot of sugar. In some cases, you may get nutrition through a feeding tube that is passed through your nose and into your stomach (nasogastric tube, or NG tube). This may be done if you have uncontrolled vomiting or diarrhea. Beverages to avoid  Certain beverages may make dehydration worse. While you rehydrate, avoid drinking alcohol. How to tell if you are recovering from dehydration You may be recovering from dehydration if: You are urinating more often than before you started rehydrating. Your urine is pale yellow. Your energy level improves. You vomit less frequently. You have diarrhea less frequently. Your appetite improves or returns to normal. You feel less dizzy or less light-headed.   Your skin tone and color start to look more normal. Follow these instructions at home: Take over-the-counter and prescription medicines only as told by your health care provider. Do not take sodium  tablets. Doing this can lead to having too much sodium in your body (hypernatremia). Contact a health care provider if: You continue to have symptoms of mild or moderate dehydration, such as: Thirst. Dry lips. Slightly dry mouth. Dizziness. Dark urine or less urine than normal. Muscle cramps. You continue to vomit or have diarrhea. Get help right away if you: Have symptoms of dehydration that get worse. Have a fever. Have a severe headache. Have been vomiting and the following happens: Your vomiting gets worse or does not go away. Your vomit includes blood or green matter (bile). You cannot eat or drink without vomiting. Have problems with urination or bowel movements, such as: Diarrhea that gets worse or does not go away. Blood in your stool (feces). This may cause stool to look black and tarry. Not urinating, or urinating only a small amount of very dark urine, within 6-8 hours. Have trouble breathing. Have symptoms that get worse with treatment. These symptoms may represent a serious problem that is an emergency. Do not wait to see if the symptoms will go away. Get medical help right away. Call your local emergency services (911 in the [place] Do not drive yourself to the hospital. Summary Rehydration is the replacement of body fluids and minerals (electrolytes) that are lost during dehydration. Follow instructions from your health care provider for rehydration. The kind of fluid and amount you should drink depend on your condition. Slowly increase how much you drink until you have taken the amount recommended by your health care provider. Contact your health care provider if you continue to show signs of mild or moderate dehydration. This information is not intended to replace advice given to you by your health care provider. Make sure you discuss any questions you have with your health care provider. Document Revised: 01/04/2020 Document Reviewed: 11/14/2019 Elsevier Patient  Education  2023 [employer]  

## 2022-04-15 NOTE — Telephone Encounter (Signed)
Patient called in and stated he would like to come in today for IVF, I let the patient he can come in at noon. Patient gave verbal understanding and had no further questions or concerns

## 2022-04-16 ENCOUNTER — Ambulatory Visit: Payer: BC Managed Care – PPO

## 2022-04-17 ENCOUNTER — Other Ambulatory Visit: Payer: Self-pay | Admitting: *Deleted

## 2022-04-17 ENCOUNTER — Ambulatory Visit: Payer: BC Managed Care – PPO

## 2022-04-17 DIAGNOSIS — C20 Malignant neoplasm of rectum: Secondary | ICD-10-CM

## 2022-04-17 MED ORDER — SODIUM CHLORIDE 0.9 % IV SOLN: INTRAVENOUS | Status: DC

## 2022-04-17 NOTE — Progress Notes (Signed)
Spoke with wife to inquire if Kenneth [name] would prefer his 6/5 IVF be added to his 6/6 tx appointment with it being an additional 2 hours chair time. Also if he would like to move 6/7 IVF to day of pump d/c on 6/8. He agrees to both these changes. Would like to keep 6/9 on the books based on how he feels on 6/8. Scheduling notified.

## 2022-04-18 ENCOUNTER — Other Ambulatory Visit (HOSPITAL_BASED_OUTPATIENT_CLINIC_OR_DEPARTMENT_OTHER): Payer: Self-pay

## 2022-04-18 ENCOUNTER — Inpatient Hospital Stay: Payer: BC Managed Care – PPO | Attending: Nurse Practitioner

## 2022-04-18 ENCOUNTER — Other Ambulatory Visit: Payer: Self-pay | Admitting: *Deleted

## 2022-04-18 VITALS — BP 104/86 | HR 65 | Temp 97.7°F | Resp 18 | Ht 71.0 in | Wt 190.4 lb

## 2022-04-18 DIAGNOSIS — C20 Malignant neoplasm of rectum: Secondary | ICD-10-CM | POA: Diagnosis not present

## 2022-04-18 DIAGNOSIS — Z923 Personal history of irradiation: Secondary | ICD-10-CM | POA: Insufficient documentation

## 2022-04-18 DIAGNOSIS — I10 Essential (primary) hypertension: Secondary | ICD-10-CM | POA: Insufficient documentation

## 2022-04-18 DIAGNOSIS — Z5111 Encounter for antineoplastic chemotherapy: Secondary | ICD-10-CM | POA: Diagnosis not present

## 2022-04-18 DIAGNOSIS — M109 Gout, unspecified: Secondary | ICD-10-CM | POA: Diagnosis not present

## 2022-04-18 DIAGNOSIS — D61810 Antineoplastic chemotherapy induced pancytopenia: Secondary | ICD-10-CM | POA: Insufficient documentation

## 2022-04-18 DIAGNOSIS — Z5189 Encounter for other specified aftercare: Secondary | ICD-10-CM | POA: Insufficient documentation

## 2022-04-18 DIAGNOSIS — T451X5D Adverse effect of antineoplastic and immunosuppressive drugs, subsequent encounter: Secondary | ICD-10-CM | POA: Insufficient documentation

## 2022-04-18 DIAGNOSIS — L409 Psoriasis, unspecified: Secondary | ICD-10-CM | POA: Diagnosis not present

## 2022-04-18 DIAGNOSIS — Z95828 Presence of other vascular implants and grafts: Secondary | ICD-10-CM

## 2022-04-18 MED ORDER — SODIUM CHLORIDE 0.9% FLUSH
10.0000 mL | Freq: Once | INTRAVENOUS | Status: AC
  Administered 2022-04-18: 10 mL via INTRAVENOUS

## 2022-04-18 MED ORDER — PROCHLORPERAZINE MALEATE 10 MG PO TABS
10.0000 mg | ORAL_TABLET | Freq: Four times a day (QID) | ORAL | 1 refills | Status: DC | PRN
  Filled 2022-04-18: qty 60, 15d supply, fill #0

## 2022-04-18 MED ORDER — SODIUM CHLORIDE 0.9 % IV SOLN: INTRAVENOUS | Status: AC

## 2022-04-18 MED ORDER — DIPHENOXYLATE-ATROPINE 2.5-0.025 MG PO TABS
ORAL_TABLET | ORAL | 0 refills | Status: DC
  Filled 2022-04-18: qty 75, 9d supply, fill #0

## 2022-04-18 MED ORDER — HEPARIN SOD (PORK) LOCK FLUSH 100 UNIT/ML IV SOLN
500.0000 [IU] | Freq: Once | INTRAVENOUS | Status: AC
  Administered 2022-04-18: 500 [IU] via INTRAVENOUS

## 2022-04-18 MED ORDER — POTASSIUM CHLORIDE CRYS ER 20 MEQ PO TBCR
20.0000 meq | EXTENDED_RELEASE_TABLET | Freq: Every day | ORAL | 1 refills | Status: DC
  Filled 2022-04-18: qty 30, 30d supply, fill #0

## 2022-04-18 NOTE — Telephone Encounter (Signed)
Patient in infusion area today and asking for refills on K+, Lomotil, and Compazine and wishes them sent to [facility]. Scripts sent as requested and called [facility] to cancel his refills on compazine and KCl.

## 2022-04-19 DIAGNOSIS — C20 Malignant neoplasm of rectum: Secondary | ICD-10-CM | POA: Diagnosis not present

## 2022-04-20 ENCOUNTER — Other Ambulatory Visit: Payer: Self-pay | Admitting: Oncology

## 2022-04-21 ENCOUNTER — Other Ambulatory Visit: Payer: Self-pay

## 2022-04-21 ENCOUNTER — Inpatient Hospital Stay: Payer: BC Managed Care – PPO

## 2022-04-21 DIAGNOSIS — C20 Malignant neoplasm of rectum: Secondary | ICD-10-CM

## 2022-04-22 ENCOUNTER — Inpatient Hospital Stay: Payer: BC Managed Care – PPO

## 2022-04-22 ENCOUNTER — Encounter: Payer: Self-pay | Admitting: *Deleted

## 2022-04-22 ENCOUNTER — Other Ambulatory Visit: Payer: Self-pay | Admitting: *Deleted

## 2022-04-22 ENCOUNTER — Inpatient Hospital Stay: Payer: BC Managed Care – PPO | Admitting: Nurse Practitioner

## 2022-04-22 ENCOUNTER — Encounter: Payer: Self-pay | Admitting: Nurse Practitioner

## 2022-04-22 VITALS — BP 122/88 | HR 66 | Temp 97.3°F | Resp 16

## 2022-04-22 VITALS — BP 107/80 | HR 72 | Temp 98.1°F | Resp 16 | Ht 71.0 in | Wt 190.0 lb

## 2022-04-22 DIAGNOSIS — C20 Malignant neoplasm of rectum: Secondary | ICD-10-CM

## 2022-04-22 DIAGNOSIS — Z5189 Encounter for other specified aftercare: Secondary | ICD-10-CM | POA: Diagnosis not present

## 2022-04-22 DIAGNOSIS — D61810 Antineoplastic chemotherapy induced pancytopenia: Secondary | ICD-10-CM | POA: Diagnosis not present

## 2022-04-22 DIAGNOSIS — Z5111 Encounter for antineoplastic chemotherapy: Secondary | ICD-10-CM | POA: Diagnosis not present

## 2022-04-22 DIAGNOSIS — I10 Essential (primary) hypertension: Secondary | ICD-10-CM | POA: Diagnosis not present

## 2022-04-22 DIAGNOSIS — T451X5D Adverse effect of antineoplastic and immunosuppressive drugs, subsequent encounter: Secondary | ICD-10-CM | POA: Diagnosis not present

## 2022-04-22 DIAGNOSIS — M109 Gout, unspecified: Secondary | ICD-10-CM | POA: Diagnosis not present

## 2022-04-22 DIAGNOSIS — Z923 Personal history of irradiation: Secondary | ICD-10-CM | POA: Diagnosis not present

## 2022-04-22 DIAGNOSIS — L409 Psoriasis, unspecified: Secondary | ICD-10-CM | POA: Diagnosis not present

## 2022-04-22 LAB — CMP (CANCER CENTER ONLY)
ALT: 22 U/L (ref 0–44)
AST: 29 U/L (ref 15–41)
Albumin: 3.9 g/dL (ref 3.5–5.0)
Alkaline Phosphatase: 151 U/L — ABNORMAL HIGH (ref 38–126)
Anion gap: 10 (ref 5–15)
BUN: 16 mg/dL (ref 6–20)
CO2: 27 mmol/L (ref 22–32)
Calcium: 9.4 mg/dL (ref 8.9–10.3)
Chloride: 101 mmol/L (ref 98–111)
Creatinine: 1.39 mg/dL — ABNORMAL HIGH (ref 0.61–1.24)
GFR, Estimated: 58 mL/min — ABNORMAL LOW (ref >60)
Glucose, Bld: 105 mg/dL — ABNORMAL HIGH (ref 70–99)
Potassium: 4 mmol/L (ref 3.5–5.1)
Sodium: 138 mmol/L (ref 135–145)
Total Bilirubin: 0.5 mg/dL (ref 0.3–1.2)
Total Protein: 6.9 g/dL (ref 6.5–8.1)

## 2022-04-22 LAB — CBC WITH DIFFERENTIAL (CANCER CENTER ONLY)
Abs Immature Granulocytes: 0.37 10*3/uL — ABNORMAL HIGH (ref 0.00–0.07)
Basophils Absolute: 0.1 10*3/uL (ref 0.0–0.1)
Basophils Relative: 0 %
Eosinophils Absolute: 0.2 10*3/uL (ref 0.0–0.5)
Eosinophils Relative: 2 %
HCT: 37.1 % — ABNORMAL LOW (ref 39.0–52.0)
Hemoglobin: 12.7 g/dL — ABNORMAL LOW (ref 13.0–17.0)
Immature Granulocytes: 3 %
Lymphocytes Relative: 15 %
Lymphs Abs: 1.8 10*3/uL (ref 0.7–4.0)
MCH: 28.9 pg (ref 26.0–34.0)
MCHC: 34.2 g/dL (ref 30.0–36.0)
MCV: 84.3 fL (ref 80.0–100.0)
Monocytes Absolute: 0.9 10*3/uL (ref 0.1–1.0)
Monocytes Relative: 8 %
Neutro Abs: 8.8 10*3/uL — ABNORMAL HIGH (ref 1.7–7.7)
Neutrophils Relative %: 72 %
Platelet Count: 73 10*3/uL — ABNORMAL LOW (ref 150–400)
RBC: 4.4 MIL/uL (ref 4.22–5.81)
RDW: 22.6 % — ABNORMAL HIGH (ref 11.5–15.5)
WBC Count: 12.1 10*3/uL — ABNORMAL HIGH (ref 4.0–10.5)
nRBC: 0 % (ref 0.0–0.2)

## 2022-04-22 MED ORDER — LEUCOVORIN CALCIUM INJECTION 350 MG
400.0000 mg/m2 | Freq: Once | INTRAVENOUS | Status: AC
  Administered 2022-04-22: 840 mg via INTRAVENOUS
  Filled 2022-04-22: qty 42

## 2022-04-22 MED ORDER — DEXTROSE 5 % IV SOLN: Freq: Once | INTRAVENOUS | Status: AC

## 2022-04-22 MED ORDER — SODIUM CHLORIDE 0.9 % IV SOLN: Freq: Once | INTRAVENOUS | Status: AC

## 2022-04-22 MED ORDER — SODIUM CHLORIDE 0.9 % IV SOLN
5000.0000 mg | INTRAVENOUS | Status: DC
  Administered 2022-04-22: 5000 mg via INTRAVENOUS
  Filled 2022-04-22: qty 100

## 2022-04-22 MED ORDER — OXALIPLATIN CHEMO INJECTION 100 MG/20ML
65.0000 mg/m2 | Freq: Once | INTRAVENOUS | Status: AC
  Administered 2022-04-22: 135 mg via INTRAVENOUS
  Filled 2022-04-22: qty 20

## 2022-04-22 MED ORDER — PALONOSETRON HCL INJECTION 0.25 MG/5ML
0.2500 mg | Freq: Once | INTRAVENOUS | Status: AC
  Administered 2022-04-22: 0.25 mg via INTRAVENOUS
  Filled 2022-04-22: qty 5

## 2022-04-22 MED ORDER — SODIUM CHLORIDE 0.9 % IV SOLN
10.0000 mg | Freq: Once | INTRAVENOUS | Status: AC
  Administered 2022-04-22: 10 mg via INTRAVENOUS
  Filled 2022-04-22: qty 1

## 2022-04-22 MED ORDER — FLUOROURACIL CHEMO INJECTION 2.5 GM/50ML
400.0000 mg/m2 | Freq: Once | INTRAVENOUS | Status: AC
  Administered 2022-04-22: 850 mg via INTRAVENOUS
  Filled 2022-04-22: qty 17

## 2022-04-22 NOTE — Progress Notes (Signed)
Patient seen by [name] NP today  Vitals are within treatment parameters.  Labs reviewed by [name] NP and are not all within treatment parameters. Platelets=73,000 --OK to treat  Per physician team, patient is ready for treatment. Please note that modifications are being made to the treatment plan including    Oxaliplatin dose decreased to 65 mg/m2

## 2022-04-22 NOTE — Patient Instructions (Addendum)
[place]  The chemotherapy medication bag should finish at 46 hours, 96 hours, or 7 days. For example, if your pump is scheduled for 46 hours and it was put on at 4:00 p.m., it should finish at 2:00 p.m. the day it is scheduled to come off regardless of your appointment time.     Estimated time to finish at 10:30 Thursday, April 24, 2022.   If the display on your pump reads "Low Volume" and it is beeping, take the batteries out of the pump and come to the cancer center for it to be taken off.   If the pump alarms go off prior to the pump reading "Low Volume" then call [phone] and someone can assist you.  If the plunger comes out and the chemotherapy medication is leaking out, please use your home chemo spill kit to clean up the spill. Do NOT use paper towels or other household products.  If you have problems or questions regarding your pump, please call either [phone] (24 hours a day) or the cancer center Monday-Friday 8:00 a.m.- 4:30 p.m. at the clinic number and we will assist you. If you are unable to get assistance, then go to the nearest Emergency Department and ask the staff to contact the IV team for assistance.   Discharge Instructions: Thank you for choosing [facility] to provide your oncology and hematology care.   If you have a lab appointment with the [facility], please go directly to the [facility] and check in at the registration area.   Wear comfortable clothing and clothing appropriate for easy access to any Portacath or PICC line.   We strive to give you quality time with your provider. You may need to reschedule your appointment if you arrive late (15 or more minutes).  Arriving late affects you and other patients whose appointments are after yours.  Also, if you miss three or more appointments without notifying the office, you may be dismissed from the clinic at the provider's discretion.      For prescription  refill requests, have your pharmacy contact our office and allow 72 hours for refills to be completed.    Today you received the following chemotherapy and/or immunotherapy agents Oxaliplatin, Leucovorin, Fluorouracil.      To help prevent nausea and vomiting after your treatment, we encourage you to take your nausea medication as directed.  BELOW ARE SYMPTOMS THAT SHOULD BE REPORTED IMMEDIATELY: *FEVER GREATER THAN 100.4 F (38 C) OR HIGHER *CHILLS OR SWEATING *NAUSEA AND VOMITING THAT IS NOT CONTROLLED WITH YOUR NAUSEA MEDICATION *UNUSUAL SHORTNESS OF BREATH *UNUSUAL BRUISING OR BLEEDING *URINARY PROBLEMS (pain or burning when urinating, or frequent urination) *BOWEL PROBLEMS (unusual diarrhea, constipation, pain near the anus) TENDERNESS IN MOUTH AND THROAT WITH OR WITHOUT PRESENCE OF ULCERS (sore throat, sores in mouth, or a toothache) UNUSUAL RASH, SWELLING OR PAIN  UNUSUAL VAGINAL DISCHARGE OR ITCHING   Items with * indicate a potential emergency and should be followed up as soon as possible or go to the Emergency Department if any problems should occur.  Please show the CHEMOTHERAPY ALERT CARD or IMMUNOTHERAPY ALERT CARD at check-in to the Emergency Department and triage nurse.  Should you have questions after your visit or need to cancel or reschedule your appointment, please contact [facility]  Dept: [phone]  and follow the prompts.  Office hours are 8:00 a.m. to 4:30 p.m. Monday - Friday. Please note that voicemails  left after 4:00 p.m. may not be returned until the following business day.  We are closed weekends and major holidays. You have access to a nurse at all times for urgent questions. Please call the main number to the clinic Dept: [phone] and follow the prompts.   For any non-urgent questions, you may also contact your provider using MyChart. We now offer e-Visits for anyone 70 and older to request care online for non-urgent  symptoms. For details visit [email or web address].   Also download the MyChart app! Go to the app store, search "MyChart", open the app, select [facility], and log in with your MyChart username and password.  Due to Covid, a mask is required upon entering the hospital/clinic. If you do not have a mask, one will be given to you upon arrival. For doctor visits, patients may have 1 support person aged 24 or older with them. For treatment visits, patients cannot have anyone with them due to current Covid guidelines and our immunocompromised population.   Oxaliplatin Injection What is this medication? OXALIPLATIN (ox AL i PLA tin) is a chemotherapy drug. It targets fast dividing cells, like cancer cells, and causes these cells to die. This medicine is used to treat cancers of the colon and rectum, and many other cancers. This medicine may be used for other purposes; ask your health care provider or pharmacist if you have questions. COMMON BRAND NAME(S): Eloxatin What should I tell my care team before I take this medication? They need to know if you have any of these conditions: heart disease history of irregular heartbeat liver disease low blood counts, like white cells, platelets, or red blood cells lung or breathing disease, like asthma take medicines that treat or prevent blood clots tingling of the fingers or toes, or other nerve disorder an unusual or allergic reaction to oxaliplatin, other chemotherapy, other medicines, foods, dyes, or preservatives pregnant or trying to get pregnant breast-feeding How should I use this medication? This drug is given as an infusion into a vein. It is administered in a hospital or clinic by a specially trained health care professional. Talk to your pediatrician regarding the use of this medicine in children. Special care may be needed. Overdosage: If you think you have taken too much of this medicine contact a poison control center or emergency room at  once. NOTE: This medicine is only for you. Do not share this medicine with others. What if I miss a dose? It is important not to miss a dose. Call your doctor or health care professional if you are unable to keep an appointment. What may interact with this medication? Do not take this medicine with any of the following medications: cisapride dronedarone pimozide thioridazine This medicine may also interact with the following medications: aspirin and aspirin-like medicines certain medicines that treat or prevent blood clots like warfarin, apixaban, dabigatran, and rivaroxaban cisplatin cyclosporine diuretics medicines for infection like acyclovir, adefovir, amphotericin B, bacitracin, cidofovir, foscarnet, ganciclovir, gentamicin, pentamidine, vancomycin NSAIDs, medicines for pain and inflammation, like ibuprofen or naproxen other medicines that prolong the QT interval (an abnormal heart rhythm) pamidronate zoledronic acid This list may not describe all possible interactions. Give your health care provider a list of all the medicines, herbs, non-prescription drugs, or dietary supplements you use. Also tell them if you smoke, drink alcohol, or use illegal drugs. Some items may interact with your medicine. What should I watch for while using this medication? Your condition will be monitored carefully while you are receiving this  medicine. You may need blood work done while you are taking this medicine. This medicine may make you feel generally unwell. This is not uncommon as chemotherapy can affect healthy cells as well as cancer cells. Report any side effects. Continue your course of treatment even though you feel ill unless your healthcare professional tells you to stop. This medicine can make you more sensitive to cold. Do not drink cold drinks or use ice. Cover exposed skin before coming in contact with cold temperatures or cold objects. When out in cold weather wear warm clothing and  cover your mouth and nose to warm the air that goes into your lungs. Tell your doctor if you get sensitive to the cold. Do not become pregnant while taking this medicine or for 9 months after stopping it. Women should inform their health care professional if they wish to become pregnant or think they might be pregnant. Men should not father a child while taking this medicine and for 6 months after stopping it. There is potential for serious side effects to an unborn child. Talk to your health care professional for more information. Do not breast-feed a child while taking this medicine or for 3 months after stopping it. This medicine has caused ovarian failure in some women. This medicine may make it more difficult to get pregnant. Talk to your health care professional if you are concerned about your fertility. This medicine has caused decreased sperm counts in some men. This may make it more difficult to father a child. Talk to your health care professional if you are concerned about your fertility. This medicine may increase your risk of getting an infection. Call your health care professional for advice if you get a fever, chills, or sore throat, or other symptoms of a cold or flu. Do not treat yourself. Try to avoid being around people who are sick. Avoid taking medicines that contain aspirin, acetaminophen, ibuprofen, naproxen, or ketoprofen unless instructed by your health care professional. These medicines may hide a fever. Be careful brushing or flossing your teeth or using a toothpick because you may get an infection or bleed more easily. If you have any dental work done, tell your dentist you are receiving this medicine. What side effects may I notice from receiving this medication? Side effects that you should report to your doctor or health care professional as soon as possible: allergic reactions like skin rash, itching or hives, swelling of the face, lips, or tongue breathing  problems cough low blood counts - this medicine may decrease the number of white blood cells, red blood cells, and platelets. You may be at increased risk for infections and bleeding nausea, vomiting pain, redness, or irritation at site where injected pain, tingling, numbness in the hands or feet signs and symptoms of bleeding such as bloody or black, tarry stools; red or dark brown urine; spitting up blood or brown material that looks like coffee grounds; red spots on the skin; unusual bruising or bleeding from the eyes, gums, or nose signs and symptoms of a dangerous change in heartbeat or heart rhythm like chest pain; dizziness; fast, irregular heartbeat; palpitations; feeling faint or lightheaded; falls signs and symptoms of infection like fever; chills; cough; sore throat; pain or trouble passing urine signs and symptoms of liver injury like dark yellow or brown urine; general ill feeling or flu-like symptoms; light-colored stools; loss of appetite; nausea; right upper belly pain; unusually weak or tired; yellowing of the eyes or skin signs and symptoms of  low red blood cells or anemia such as unusually weak or tired; feeling faint or lightheaded; falls signs and symptoms of muscle injury like dark urine; trouble passing urine or change in the amount of urine; unusually weak or tired; muscle pain; back pain Side effects that usually do not require medical attention (report to your doctor or health care professional if they continue or are bothersome): changes in taste diarrhea gas hair loss loss of appetite mouth sores This list may not describe all possible side effects. Call your doctor for medical advice about side effects. You may report side effects to FDA at 1-800-FDA-1088. Where should I keep my medication? This drug is given in a hospital or clinic and will not be stored at home. NOTE: This sheet is a summary. It may not cover all possible information. If you have questions about  this medicine, talk to your doctor, pharmacist, or health care provider.  2023 Elsevier/Gold Standard (2021-10-04 00:00:00)  Leucovorin injection What is this medication? LEUCOVORIN (loo koe VOR in) is used to prevent or treat the harmful effects of some medicines. This medicine is used to treat anemia caused by a low amount of folic acid in the body. It is also used with 5-fluorouracil (5-FU) to treat colon cancer. This medicine may be used for other purposes; ask your health care provider or pharmacist if you have questions. What should I tell my care team before I take this medication? They need to know if you have any of these conditions: anemia from low levels of vitamin B-12 in the blood an unusual or allergic reaction to leucovorin, folic acid, other medicines, foods, dyes, or preservatives pregnant or trying to get pregnant breast-feeding How should I use this medication? This medicine is for injection into a muscle or into a vein. It is given by a health care professional in a hospital or clinic setting. Talk to your pediatrician regarding the use of this medicine in children. Special care may be needed. Overdosage: If you think you have taken too much of this medicine contact a poison control center or emergency room at once. NOTE: This medicine is only for you. Do not share this medicine with others. What if I miss a dose? This does not apply. What may interact with this medication? capecitabine fluorouracil phenobarbital phenytoin primidone trimethoprim-sulfamethoxazole This list may not describe all possible interactions. Give your health care provider a list of all the medicines, herbs, non-prescription drugs, or dietary supplements you use. Also tell them if you smoke, drink alcohol, or use illegal drugs. Some items may interact with your medicine. What should I watch for while using this medication? Your condition will be monitored carefully while you are receiving this  medicine. This medicine may increase the side effects of 5-fluorouracil, 5-FU. Tell your doctor or health care professional if you have diarrhea or mouth sores that do not get better or that get worse. What side effects may I notice from receiving this medication? Side effects that you should report to your doctor or health care professional as soon as possible: allergic reactions like skin rash, itching or hives, swelling of the face, lips, or tongue breathing problems fever, infection mouth sores unusual bleeding or bruising unusually weak or tired Side effects that usually do not require medical attention (report to your doctor or health care professional if they continue or are bothersome): constipation or diarrhea loss of appetite nausea, vomiting This list may not describe all possible side effects. Call your doctor for medical  advice about side effects. You may report side effects to FDA at 1-800-FDA-1088. Where should I keep my medication? This drug is given in a hospital or clinic and will not be stored at home. NOTE: This sheet is a summary. It may not cover all possible information. If you have questions about this medicine, talk to your doctor, pharmacist, or health care provider.  2023 Elsevier/Gold Standard (2008-05-11 00:00:00)  Fluorouracil, 5-FU injection What is this medication? FLUOROURACIL, 5-FU (flure oh YOOR a sil) is a chemotherapy drug. It slows the growth of cancer cells. This medicine is used to treat many types of cancer like breast cancer, colon or rectal cancer, pancreatic cancer, and stomach cancer. This medicine may be used for other purposes; ask your health care provider or pharmacist if you have questions. COMMON BRAND NAME(S): Adrucil What should I tell my care team before I take this medication? They need to know if you have any of these conditions: blood disorders dihydropyrimidine dehydrogenase (DPD) deficiency infection (especially a virus  infection such as chickenpox, cold sores, or herpes) kidney disease liver disease malnourished, poor nutrition recent or ongoing radiation therapy an unusual or allergic reaction to fluorouracil, other chemotherapy, other medicines, foods, dyes, or preservatives pregnant or trying to get pregnant breast-feeding How should I use this medication? This drug is given as an infusion or injection into a vein. It is administered in a hospital or clinic by a specially trained health care professional. Talk to your pediatrician regarding the use of this medicine in children. Special care may be needed. Overdosage: If you think you have taken too much of this medicine contact a poison control center or emergency room at once. NOTE: This medicine is only for you. Do not share this medicine with others. What if I miss a dose? It is important not to miss your dose. Call your doctor or health care professional if you are unable to keep an appointment. What may interact with this medication? Do not take this medicine with any of the following medications: live virus vaccines This medicine may also interact with the following medications: medicines that treat or prevent blood clots like warfarin, enoxaparin, and dalteparin This list may not describe all possible interactions. Give your health care provider a list of all the medicines, herbs, non-prescription drugs, or dietary supplements you use. Also tell them if you smoke, drink alcohol, or use illegal drugs. Some items may interact with your medicine. What should I watch for while using this medication? Visit your doctor for checks on your progress. This drug may make you feel generally unwell. This is not uncommon, as chemotherapy can affect healthy cells as well as cancer cells. Report any side effects. Continue your course of treatment even though you feel ill unless your doctor tells you to stop. In some cases, you may be given additional medicines to  help with side effects. Follow all directions for their use. Call your doctor or health care professional for advice if you get a fever, chills or sore throat, or other symptoms of a cold or flu. Do not treat yourself. This drug decreases your body's ability to fight infections. Try to avoid being around people who are sick. This medicine may increase your risk to bruise or bleed. Call your doctor or health care professional if you notice any unusual bleeding. Be careful brushing and flossing your teeth or using a toothpick because you may get an infection or bleed more easily. If you have any dental work done,  tell your dentist you are receiving this medicine. Avoid taking products that contain aspirin, acetaminophen, ibuprofen, naproxen, or ketoprofen unless instructed by your doctor. These medicines may hide a fever. Do not become pregnant while taking this medicine. Women should inform their doctor if they wish to become pregnant or think they might be pregnant. There is a potential for serious side effects to an unborn child. Talk to your health care professional or pharmacist for more information. Do not breast-feed an infant while taking this medicine. Men should inform their doctor if they wish to father a child. This medicine may lower sperm counts. Do not treat diarrhea with over the counter products. Contact your doctor if you have diarrhea that lasts more than 2 days or if it is severe and watery. This medicine can make you more sensitive to the sun. Keep out of the sun. If you cannot avoid being in the sun, wear protective clothing and use sunscreen. Do not use sun lamps or tanning beds/booths. What side effects may I notice from receiving this medication? Side effects that you should report to your doctor or health care professional as soon as possible: allergic reactions like skin rash, itching or hives, swelling of the face, lips, or tongue low blood counts - this medicine may decrease  the number of white blood cells, red blood cells and platelets. You may be at increased risk for infections and bleeding. signs of infection - fever or chills, cough, sore throat, pain or difficulty passing urine signs of decreased platelets or bleeding - bruising, pinpoint red spots on the skin, black, tarry stools, blood in the urine signs of decreased red blood cells - unusually weak or tired, fainting spells, lightheadedness breathing problems changes in vision chest pain mouth sores nausea and vomiting pain, swelling, redness at site where injected pain, tingling, numbness in the hands or feet redness, swelling, or sores on hands or feet stomach pain unusual bleeding Side effects that usually do not require medical attention (report to your doctor or health care professional if they continue or are bothersome): changes in finger or toe nails diarrhea dry or itchy skin hair loss headache loss of appetite sensitivity of eyes to the light stomach upset unusually teary eyes This list may not describe all possible side effects. Call your doctor for medical advice about side effects. You may report side effects to FDA at 1-800-FDA-1088. Where should I keep my medication? This drug is given in a hospital or clinic and will not be stored at home. NOTE: This sheet is a summary. It may not cover all possible information. If you have questions about this medicine, talk to your doctor, pharmacist, or health care provider.  2023 Elsevier/Gold Standard (2021-10-04 00:00:00) \[id number]\

## 2022-04-22 NOTE — Progress Notes (Signed)
[name] OFFICE PROGRESS NOTE   Diagnosis: Rectal cancer  INTERVAL HISTORY:   Kenneth [name] returns as scheduled.  He completed cycle 4 FOLFOX 04/07/2022.  He received IV fluids 3 days last week.  He overall is feeling well.  He denies nausea/vomiting.  No mouth sores.  No diarrhea.  He has occasional mild numbness in the left hand.  This does not occur on a consistent basis.  Cold sensitivity is becoming more prolonged.  No bleeding.  Objective:  Vital signs in last 24 hours:  Blood pressure 107/80, pulse 72, temperature 98.1 F (36.7 C), temperature source Oral, resp. rate 16, height _0  (1.803 m), weight 190 lb (86.2 kg), SpO2 98 %.    HEENT: No thrush or ulcers.  Posterior palate is erythematous. Resp: Lungs clear bilaterally. Cardio: Regular rate and rhythm. GI: Abdomen soft and nontender.  No hepatomegaly.  Right abdomen ileostomy.  No stool in the collection bag. Vascular: No leg edema. Neuro: Vibratory sense intact over the fingertips per tuning fork exam. Skin: Palms with mild erythema. Port-A-Cath without erythema.  Lab Results:  Lab Results  Component Value Date   WBC 12.1 (H) 04/22/2022   HGB 12.7 (L) 04/22/2022   HCT 37.1 (L) 04/22/2022   MCV 84.3 04/22/2022   PLT 73 (L) 04/22/2022   NEUTROABS 8.8 (H) 04/22/2022    Imaging:  No results found.  Medications: I have reviewed the patient's current medications.  Assessment/Plan: Rectal cancer-mass at 11 cm on proctoscopy by Dr. Morton Stall 02/04/2022 Colonoscopy 12/26/2021-obstructing mass at the rectosigmoid measured at 15 cm, biopsy invasive moderate to poorly differentiated adenocarcinoma, mismatch repair protein expression intact CTs 12/31/2021-masslike circumferential thickening of the distal sigmoid colon with mildly enlarged pericolonic lymph nodes, numerous subcentimeter hypodense liver lesions, splenomegaly MRI abdomen 01/07/2022-innumerable T2 hyperintense liver lesions consistent with  cysts, spleen unremarkable, no ascites or adenopathy, moderate colonic fecal retention, no bowel obstruction MRI pelvis 01/22/2022-T3cN2 tumor above and below the peritoneal reflection, tumor 11.3 cm from the anal verge and 6.1 cm from the sphincter complex, multiple enlarged perirectal nodes, 1.1 cm node versus tumor deposit at the right aspect of the tumor Diverting loop ileostomy 01/15/2022 Cycle 1 FOLFOX 02/17/2022 Cycle 2 FOLFOX 03/04/2022 Cycle 3 FOLFOX held on 03/17/2022 due to neutropenia Cycle 3 FOLFOX 03/17/2022, Udenyca Treatment held 03/31/2022 due to dehydration Cycle 4 FOLFOX 04/07/2022 Cycle 5 FOLFOX 04/22/2022, oxaliplatin dose reduced due to thrombocytopenia Gout Psoriasis Hypertension Left scalene node versus cyst/lipoma on exam 02/03/2022-CT neck 02/10/2022 with 9.5 mm lymph node in the left supraclavicular region posterior to the sternocleidomastoid muscle.  No other prominent lymph nodes in the neck.  Biopsy 02/12/2022 compatible with benign lymph node, negative for metastatic carcinoma.  Disposition: Kenneth [name] appears stable.  He has completed 4 cycles of FOLFOX.  Plan to proceed with cycle 5 today as scheduled.  Oxaliplatin will be dose reduced due to thrombocytopenia.  Performance status is better since receiving scheduled IV fluids.  Plan to continue the same (IV fluids on Monday Wednesday Friday off week; treatment week IV fluids on day of treatment and day of pump DC).  CBC and chemistry panel reviewed.  Labs adequate to proceed with treatment today as scheduled.  Mild to moderate thrombocytopenia, oxaliplatin dose reduced as above.  He understands to contact the office with bleeding.  He will return for lab, follow-up, cycle 6 FOLFOX in 2 weeks.  IV fluids as outlined above.  He will contact the office in the interim with any  problems.  Plan reviewed with Dr. [name].  [name] ANP/GNP-BC   04/22/2022  9:54 AM

## 2022-04-23 ENCOUNTER — Inpatient Hospital Stay: Payer: BC Managed Care – PPO

## 2022-04-24 ENCOUNTER — Inpatient Hospital Stay: Payer: BC Managed Care – PPO

## 2022-04-24 VITALS — BP 114/81 | HR 66 | Temp 98.1°F | Resp 18

## 2022-04-24 DIAGNOSIS — D61810 Antineoplastic chemotherapy induced pancytopenia: Secondary | ICD-10-CM | POA: Diagnosis not present

## 2022-04-24 DIAGNOSIS — C20 Malignant neoplasm of rectum: Secondary | ICD-10-CM | POA: Diagnosis not present

## 2022-04-24 DIAGNOSIS — Z923 Personal history of irradiation: Secondary | ICD-10-CM | POA: Diagnosis not present

## 2022-04-24 DIAGNOSIS — M109 Gout, unspecified: Secondary | ICD-10-CM | POA: Diagnosis not present

## 2022-04-24 DIAGNOSIS — I10 Essential (primary) hypertension: Secondary | ICD-10-CM | POA: Diagnosis not present

## 2022-04-24 DIAGNOSIS — T451X5D Adverse effect of antineoplastic and immunosuppressive drugs, subsequent encounter: Secondary | ICD-10-CM | POA: Diagnosis not present

## 2022-04-24 DIAGNOSIS — Z5189 Encounter for other specified aftercare: Secondary | ICD-10-CM | POA: Diagnosis not present

## 2022-04-24 DIAGNOSIS — L409 Psoriasis, unspecified: Secondary | ICD-10-CM | POA: Diagnosis not present

## 2022-04-24 DIAGNOSIS — Z5111 Encounter for antineoplastic chemotherapy: Secondary | ICD-10-CM | POA: Diagnosis not present

## 2022-04-24 MED ORDER — SODIUM CHLORIDE 0.9% FLUSH
10.0000 mL | INTRAVENOUS | Status: DC | PRN
  Administered 2022-04-24: 10 mL

## 2022-04-24 MED ORDER — SODIUM CHLORIDE 0.9 % IV SOLN: INTRAVENOUS | Status: AC

## 2022-04-24 MED ORDER — PEGFILGRASTIM-CBQV 6 MG/0.6ML ~~LOC~~ SOSY
6.0000 mg | PREFILLED_SYRINGE | Freq: Once | SUBCUTANEOUS | Status: AC
  Administered 2022-04-24: 6 mg via SUBCUTANEOUS
  Filled 2022-04-24: qty 0.6

## 2022-04-24 MED ORDER — HEPARIN SOD (PORK) LOCK FLUSH 100 UNIT/ML IV SOLN
500.0000 [IU] | Freq: Once | INTRAVENOUS | Status: AC | PRN
  Administered 2022-04-24: 500 [IU]

## 2022-04-24 NOTE — Patient Instructions (Signed)
Rehydration, Adult Rehydration is the replacement of body fluids, salts, and minerals (electrolytes) that are lost during dehydration. Dehydration is when there is not enough water or other fluids in the body. This happens when you lose more fluids than you take in. Common causes of dehydration include: Not drinking enough fluids. This can occur when you are ill or doing activities that require a lot of energy, especially in hot weather. Conditions that cause loss of water or other fluids, such as diarrhea, vomiting, sweating, or urinating a lot. Other illnesses, such as fever or infection. Certain medicines, such as those that remove excess fluid from the body (diuretics). Symptoms of mild or moderate dehydration may include thirst, dry lips and mouth, and dizziness. Symptoms of severe dehydration may include increased heart rate, confusion, fainting, and not urinating. For severe dehydration, you may need to get fluids through an IV at the hospital. For mild or moderate dehydration, you can usually rehydrate at home by drinking certain fluids as told by your health care provider. What are the risks? Generally, rehydration is safe. However, taking in too much fluid (overhydration) can be a problem. This is rare. Overhydration can cause an electrolyte imbalance, kidney failure, or a decrease in salt (sodium) levels in the body. Supplies needed You will need an oral rehydration solution (ORS) if your health care provider tells you to use one. This is a drink to treat dehydration. It can be found in pharmacies and retail stores. How to rehydrate Fluids Follow instructions from your health care provider for rehydration. The kind of fluid and the amount you should drink depend on your condition. In general, you should choose drinks that you prefer. If told by your health care provider, drink an ORS. Make an ORS by following instructions on the package. Start by drinking small amounts, about  cup (120  mL) every 5-10 minutes. Slowly increase how much you drink until you have taken the amount recommended by your health care provider. Drink enough clear fluids to keep your urine pale yellow. If you were told to drink an ORS, finish it first, then start slowly drinking other clear fluids. Drink fluids such as: Water. This includes sparkling water and flavored water. Drinking only water can lead to having too little sodium in your body (hyponatremia). Follow the advice of your health care provider. Water from ice chips you suck on. Fruit juice with water you add to it (diluted). Sports drinks. Hot or cold herbal teas. Broth-based soups. Milk or milk products. Food Follow instructions from your health care provider about what to eat while you rehydrate. Your health care provider may recommend that you slowly begin eating regular foods in small amounts. Eat foods that contain a healthy balance of electrolytes, such as bananas, oranges, potatoes, tomatoes, and spinach. Avoid foods that are greasy or contain a lot of sugar. In some cases, you may get nutrition through a feeding tube that is passed through your nose and into your stomach (nasogastric tube, or NG tube). This may be done if you have uncontrolled vomiting or diarrhea. Beverages to avoid  Certain beverages may make dehydration worse. While you rehydrate, avoid drinking alcohol. How to tell if you are recovering from dehydration You may be recovering from dehydration if: You are urinating more often than before you started rehydrating. Your urine is pale yellow. Your energy level improves. You vomit less frequently. You have diarrhea less frequently. Your appetite improves or returns to normal. You feel less dizzy or less light-headed.   Your skin tone and color start to look more normal. Follow these instructions at home: Take over-the-counter and prescription medicines only as told by your health care provider. Do not take sodium  tablets. Doing this can lead to having too much sodium in your body (hypernatremia). Contact a health care provider if: You continue to have symptoms of mild or moderate dehydration, such as: Thirst. Dry lips. Slightly dry mouth. Dizziness. Dark urine or less urine than normal. Muscle cramps. You continue to vomit or have diarrhea. Get help right away if you: Have symptoms of dehydration that get worse. Have a fever. Have a severe headache. Have been vomiting and the following happens: Your vomiting gets worse or does not go away. Your vomit includes blood or green matter (bile). You cannot eat or drink without vomiting. Have problems with urination or bowel movements, such as: Diarrhea that gets worse or does not go away. Blood in your stool (feces). This may cause stool to look black and tarry. Not urinating, or urinating only a small amount of very dark urine, within 6-8 hours. Have trouble breathing. Have symptoms that get worse with treatment. These symptoms may represent a serious problem that is an emergency. Do not wait to see if the symptoms will go away. Get medical help right away. Call your local emergency services (911 in the [place] Do not drive yourself to the hospital. Summary Rehydration is the replacement of body fluids and minerals (electrolytes) that are lost during dehydration. Follow instructions from your health care provider for rehydration. The kind of fluid and amount you should drink depend on your condition. Slowly increase how much you drink until you have taken the amount recommended by your health care provider. Contact your health care provider if you continue to show signs of mild or moderate dehydration. This information is not intended to replace advice given to you by your health care provider. Make sure you discuss any questions you have with your health care provider. Document Revised: 01/04/2020 Document Reviewed: 11/14/2019 Elsevier Patient  Education  Yell.  Pegfilgrastim Injection What is this medication? PEGFILGRASTIM (PEG fil gra stim) lowers the risk of infection in people who are receiving chemotherapy. It works by Building control surveyor make more white blood cells, which protects your body from infection. It may also be used to help people who have been exposed to high doses of radiation. This medicine may be used for other purposes; ask your health care provider or pharmacist if you have questions. COMMON BRAND NAME(S): [employer], Neulasta, Nyvepria, Stimufend, UDENYCA, Ziextenzo What should I tell my care team before I take this medication? They need to know if you have any of these conditions: Kidney disease Latex allergy Ongoing radiation therapy Sickle cell disease Skin reactions to acrylic adhesives (On-Body Injector only) An unusual or allergic reaction to pegfilgrastim, filgrastim, other medications, foods, dyes, or preservatives Pregnant or trying to get pregnant Breast-feeding How should I use this medication? This medication is for injection under the skin. If you get this medication at home, you will be taught how to prepare and give the pre-filled syringe or how to use the On-body Injector. Refer to the patient Instructions for Use for detailed instructions. Use exactly as directed. Tell your care team immediately if you suspect that the On-body Injector may not have performed as intended or if you suspect the use of the On-body Injector resulted in a missed or partial dose. It is important that you put your used needles  and syringes in a special sharps container. Do not put them in a trash can. If you do not have a sharps container, call your pharmacist or care team to get one. Talk to your care team about the use of this medication in children. While this medication may be prescribed for selected conditions, precautions do apply. Overdosage: If you think you have taken too much of this  medicine contact a poison control center or emergency room at once. NOTE: This medicine is only for you. Do not share this medicine with others. What if I miss a dose? It is important not to miss your dose. Call your care team if you miss your dose. If you miss a dose due to an On-body Injector failure or leakage, a new dose should be administered as soon as possible using a single prefilled syringe for manual use. What may interact with this medication? Interactions have not been studied. This list may not describe all possible interactions. Give your health care provider a list of all the medicines, herbs, non-prescription drugs, or dietary supplements you use. Also tell them if you smoke, drink alcohol, or use illegal drugs. Some items may interact with your medicine. What should I watch for while using this medication? Your condition will be monitored carefully while you are receiving this medication. You may need blood work done while you are taking this medication. Talk to your care team about your risk of cancer. You may be more at risk for certain types of cancer if you take this medication. If you are going to need a MRI, CT scan, or other procedure, tell your care team that you are using this medication (On-Body Injector only). What side effects may I notice from receiving this medication? Side effects that you should report to your care team as soon as possible: Allergic reactions--skin rash, itching, hives, swelling of the face, lips, tongue, or throat Capillary leak syndrome--stomach or muscle pain, unusual weakness or fatigue, feeling faint or lightheaded, decrease in the amount of urine, swelling of the ankles, hands, or feet, trouble breathing High white blood cell level--fever, fatigue, trouble breathing, night sweats, change in vision, weight loss Inflammation of the aorta--fever, fatigue, back, chest, or stomach pain, severe headache Kidney injury (glomerulonephritis)--decrease  in the amount of urine, red or dark brown urine, foamy or bubbly urine, swelling of the ankles, hands, or feet Shortness of breath or trouble breathing Spleen injury--pain in upper left stomach or shoulder Unusual bruising or bleeding Side effects that usually do not require medical attention (report to your care team if they continue or are bothersome): Bone pain Pain in the hands or feet This list may not describe all possible side effects. Call your doctor for medical advice about side effects. You may report side effects to FDA at 1-800-FDA-1088. Where should I keep my medication? Keep out of the reach of children. If you are using this medication at home, you will be instructed on how to store it. Throw away any unused medication after the expiration date on the label. NOTE: This sheet is a summary. It may not cover all possible information. If you have questions about this medicine, talk to your doctor, pharmacist, or health care provider.  2023 Elsevier/Gold Standard (2021-10-04 00:00:00)

## 2022-04-25 ENCOUNTER — Other Ambulatory Visit: Payer: Self-pay

## 2022-04-25 ENCOUNTER — Inpatient Hospital Stay: Payer: BC Managed Care – PPO

## 2022-04-25 ENCOUNTER — Telehealth: Payer: Self-pay

## 2022-04-25 VITALS — BP 112/76 | HR 71 | Temp 98.1°F | Resp 18

## 2022-04-25 DIAGNOSIS — C20 Malignant neoplasm of rectum: Secondary | ICD-10-CM

## 2022-04-25 DIAGNOSIS — Z5189 Encounter for other specified aftercare: Secondary | ICD-10-CM | POA: Diagnosis not present

## 2022-04-25 DIAGNOSIS — Z95828 Presence of other vascular implants and grafts: Secondary | ICD-10-CM

## 2022-04-25 DIAGNOSIS — Z923 Personal history of irradiation: Secondary | ICD-10-CM | POA: Diagnosis not present

## 2022-04-25 DIAGNOSIS — T451X5D Adverse effect of antineoplastic and immunosuppressive drugs, subsequent encounter: Secondary | ICD-10-CM | POA: Diagnosis not present

## 2022-04-25 DIAGNOSIS — M109 Gout, unspecified: Secondary | ICD-10-CM | POA: Diagnosis not present

## 2022-04-25 DIAGNOSIS — D61810 Antineoplastic chemotherapy induced pancytopenia: Secondary | ICD-10-CM | POA: Diagnosis not present

## 2022-04-25 DIAGNOSIS — L409 Psoriasis, unspecified: Secondary | ICD-10-CM | POA: Diagnosis not present

## 2022-04-25 DIAGNOSIS — I10 Essential (primary) hypertension: Secondary | ICD-10-CM | POA: Diagnosis not present

## 2022-04-25 DIAGNOSIS — Z5111 Encounter for antineoplastic chemotherapy: Secondary | ICD-10-CM | POA: Diagnosis not present

## 2022-04-25 MED ORDER — HEPARIN SOD (PORK) LOCK FLUSH 100 UNIT/ML IV SOLN
500.0000 [IU] | Freq: Once | INTRAVENOUS | Status: AC
  Administered 2022-04-25: 500 [IU] via INTRAVENOUS

## 2022-04-25 MED ORDER — SODIUM CHLORIDE 0.9% FLUSH
10.0000 mL | Freq: Once | INTRAVENOUS | Status: AC
  Administered 2022-04-25: 10 mL via INTRAVENOUS

## 2022-04-25 MED ORDER — SODIUM CHLORIDE 0.9 % IV SOLN: INTRAVENOUS | Status: DC

## 2022-04-25 NOTE — Telephone Encounter (Signed)
Patient called in and stated he's not feeling well, and to see if he can come for IVF. I spoke with the provider and it's fine to come in. He is schedule to come in.

## 2022-04-25 NOTE — Patient Instructions (Signed)
Rehydration, Adult Rehydration is the replacement of body fluids, salts, and minerals (electrolytes) that are lost during dehydration. Dehydration is when there is not enough water or other fluids in the body. This happens when you lose more fluids than you take in. Common causes of dehydration include: Not drinking enough fluids. This can occur when you are ill or doing activities that require a lot of energy, especially in hot weather. Conditions that cause loss of water or other fluids, such as diarrhea, vomiting, sweating, or urinating a lot. Other illnesses, such as fever or infection. Certain medicines, such as those that remove excess fluid from the body (diuretics). Symptoms of mild or moderate dehydration may include thirst, dry lips and mouth, and dizziness. Symptoms of severe dehydration may include increased heart rate, confusion, fainting, and not urinating. For severe dehydration, you may need to get fluids through an IV at the hospital. For mild or moderate dehydration, you can usually rehydrate at home by drinking certain fluids as told by your health care provider. What are the risks? Generally, rehydration is safe. However, taking in too much fluid (overhydration) can be a problem. This is rare. Overhydration can cause an electrolyte imbalance, kidney failure, or a decrease in salt (sodium) levels in the body. Supplies needed You will need an oral rehydration solution (ORS) if your health care provider tells you to use one. This is a drink to treat dehydration. It can be found in pharmacies and retail stores. How to rehydrate Fluids Follow instructions from your health care provider for rehydration. The kind of fluid and the amount you should drink depend on your condition. In general, you should choose drinks that you prefer. If told by your health care provider, drink an ORS. Make an ORS by following instructions on the package. Start by drinking small amounts, about  cup (120  mL) every 5-10 minutes. Slowly increase how much you drink until you have taken the amount recommended by your health care provider. Drink enough clear fluids to keep your urine pale yellow. If you were told to drink an ORS, finish it first, then start slowly drinking other clear fluids. Drink fluids such as: Water. This includes sparkling water and flavored water. Drinking only water can lead to having too little sodium in your body (hyponatremia). Follow the advice of your health care provider. Water from ice chips you suck on. Fruit juice with water you add to it (diluted). Sports drinks. Hot or cold herbal teas. Broth-based soups. Milk or milk products. Food Follow instructions from your health care provider about what to eat while you rehydrate. Your health care provider may recommend that you slowly begin eating regular foods in small amounts. Eat foods that contain a healthy balance of electrolytes, such as bananas, oranges, potatoes, tomatoes, and spinach. Avoid foods that are greasy or contain a lot of sugar. In some cases, you may get nutrition through a feeding tube that is passed through your nose and into your stomach (nasogastric tube, or NG tube). This may be done if you have uncontrolled vomiting or diarrhea. Beverages to avoid  Certain beverages may make dehydration worse. While you rehydrate, avoid drinking alcohol. How to tell if you are recovering from dehydration You may be recovering from dehydration if: You are urinating more often than before you started rehydrating. Your urine is pale yellow. Your energy level improves. You vomit less frequently. You have diarrhea less frequently. Your appetite improves or returns to normal. You feel less dizzy or less light-headed.   Your skin tone and color start to look more normal. Follow these instructions at home: Take over-the-counter and prescription medicines only as told by your health care provider. Do not take sodium  tablets. Doing this can lead to having too much sodium in your body (hypernatremia). Contact a health care provider if: You continue to have symptoms of mild or moderate dehydration, such as: Thirst. Dry lips. Slightly dry mouth. Dizziness. Dark urine or less urine than normal. Muscle cramps. You continue to vomit or have diarrhea. Get help right away if you: Have symptoms of dehydration that get worse. Have a fever. Have a severe headache. Have been vomiting and the following happens: Your vomiting gets worse or does not go away. Your vomit includes blood or green matter (bile). You cannot eat or drink without vomiting. Have problems with urination or bowel movements, such as: Diarrhea that gets worse or does not go away. Blood in your stool (feces). This may cause stool to look black and tarry. Not urinating, or urinating only a small amount of very dark urine, within 6-8 hours. Have trouble breathing. Have symptoms that get worse with treatment. These symptoms may represent a serious problem that is an emergency. Do not wait to see if the symptoms will go away. Get medical help right away. Call your local emergency services (911 in the [place] Do not drive yourself to the hospital. Summary Rehydration is the replacement of body fluids and minerals (electrolytes) that are lost during dehydration. Follow instructions from your health care provider for rehydration. The kind of fluid and amount you should drink depend on your condition. Slowly increase how much you drink until you have taken the amount recommended by your health care provider. Contact your health care provider if you continue to show signs of mild or moderate dehydration. This information is not intended to replace advice given to you by your health care provider. Make sure you discuss any questions you have with your health care provider. Document Revised: 01/04/2020 Document Reviewed: 11/14/2019 Elsevier Patient  Education  2023 [employer]  

## 2022-04-28 ENCOUNTER — Ambulatory Visit: Payer: BC Managed Care – PPO

## 2022-04-28 ENCOUNTER — Inpatient Hospital Stay: Payer: BC Managed Care – PPO

## 2022-04-28 ENCOUNTER — Other Ambulatory Visit: Payer: BC Managed Care – PPO

## 2022-04-28 ENCOUNTER — Ambulatory Visit: Payer: BC Managed Care – PPO | Admitting: Nurse Practitioner

## 2022-04-28 ENCOUNTER — Other Ambulatory Visit: Payer: Self-pay

## 2022-04-28 DIAGNOSIS — Z5111 Encounter for antineoplastic chemotherapy: Secondary | ICD-10-CM | POA: Diagnosis not present

## 2022-04-28 DIAGNOSIS — C20 Malignant neoplasm of rectum: Secondary | ICD-10-CM | POA: Diagnosis not present

## 2022-04-28 DIAGNOSIS — D61810 Antineoplastic chemotherapy induced pancytopenia: Secondary | ICD-10-CM | POA: Diagnosis not present

## 2022-04-28 DIAGNOSIS — Z5189 Encounter for other specified aftercare: Secondary | ICD-10-CM | POA: Diagnosis not present

## 2022-04-28 DIAGNOSIS — L409 Psoriasis, unspecified: Secondary | ICD-10-CM | POA: Diagnosis not present

## 2022-04-28 DIAGNOSIS — I10 Essential (primary) hypertension: Secondary | ICD-10-CM | POA: Diagnosis not present

## 2022-04-28 DIAGNOSIS — M109 Gout, unspecified: Secondary | ICD-10-CM | POA: Diagnosis not present

## 2022-04-28 DIAGNOSIS — T451X5D Adverse effect of antineoplastic and immunosuppressive drugs, subsequent encounter: Secondary | ICD-10-CM | POA: Diagnosis not present

## 2022-04-28 DIAGNOSIS — Z923 Personal history of irradiation: Secondary | ICD-10-CM | POA: Diagnosis not present

## 2022-04-28 MED ORDER — SODIUM CHLORIDE 0.9 % IV SOLN: INTRAVENOUS | Status: DC

## 2022-04-28 MED ORDER — HEPARIN SOD (PORK) LOCK FLUSH 100 UNIT/ML IV SOLN
500.0000 [IU] | Freq: Once | INTRAVENOUS | Status: AC
  Administered 2022-04-28: 500 [IU] via INTRAVENOUS

## 2022-04-28 MED ORDER — SODIUM CHLORIDE 0.9% FLUSH
10.0000 mL | Freq: Once | INTRAVENOUS | Status: AC
  Administered 2022-04-28: 10 mL via INTRAVENOUS

## 2022-04-28 NOTE — Patient Instructions (Signed)
Implanted Port Home Guide An implanted port is a device that is placed under the skin. It is usually placed in the chest. The device may vary based on the need. Implanted ports can be used to give IV medicine, to take blood, or to give fluids. You may have an implanted port if: You need IV medicine that would be irritating to the small veins in your hands or arms. You need IV medicines, such as chemotherapy, for a long period of time. You need IV nutrition for a long period of time. You may have fewer limitations when using a port than you would if you used other types of long-term IVs. You will also likely be able to return to normal activities after your incision heals. An implanted port has two main parts: Reservoir. The reservoir is the part where a needle is inserted to give medicines or draw blood. The reservoir is round. After the port is placed, it appears as a small, raised area under your skin. Catheter. The catheter is a small, thin tube that connects the reservoir to a vein. Medicine that is inserted into the reservoir goes into the catheter and then into the vein. How is my port accessed? To access your port: A numbing cream may be placed on the skin over the port site. Your health care provider will put on a mask and sterile gloves. The skin over your port will be cleaned carefully with a germ-killing soap and allowed to dry. Your health care provider will gently pinch the port and insert a needle into it. Your health care provider will check for a blood return to make sure the port is in the vein and is still working (patent). If your port needs to remain accessed to get medicine continuously (constant infusion), your health care provider will place a clear bandage (dressing) over the needle site. The dressing and needle will need to be changed every week, or as told by your health care provider. What is flushing? Flushing helps keep the port working. Follow instructions from your  health care provider about how and when to flush the port. Ports are usually flushed with saline solution or a medicine called heparin. The need for flushing will depend on how the port is used: If the port is only used from time to time to give medicines or draw blood, the port may need to be flushed: Before and after medicines have been given. Before and after blood has been drawn. As part of routine maintenance. Flushing may be recommended every 4-6 weeks. If a constant infusion is running, the port may not need to be flushed. Throw away any syringes in a disposal container that is meant for sharp items (sharps container). You can buy a sharps container from a pharmacy, or you can make one by using an empty hard plastic bottle with a cover. How long will my port stay implanted? The port can stay in for as long as your health care provider thinks it is needed. When it is time for the port to come out, a surgery will be done to remove it. The surgery will be similar to the procedure that was done to put the port in. Follow these instructions at home: Caring for your port and port site Flush your port as told by your health care provider. If you need an infusion over several days, follow instructions from your health care provider about how to take care of your port site. Make sure you: Change your   dressing as told by your health care provider. Wash your hands with soap and water for at least 20 seconds before and after you change your dressing. If soap and water are not available, use alcohol-based hand sanitizer. Place any used dressings or infusion bags into a plastic bag. Throw that bag in the trash. Keep the dressing that covers the needle clean and dry. Do not get it wet. Do not use scissors or sharp objects near the infusion tubing. Keep any external tubes clamped, unless they are being used. Check your port site every day for signs of infection. Check for: Redness, swelling, or  pain. Fluid or blood. Warmth. Pus or a bad smell. Protect the skin around the port site. Avoid wearing bra straps that rub or irritate the site. Protect the skin around your port from seat belts. Place a soft pad over your chest if needed. Bathe or shower as told by your health care provider. The site may get wet as long as you are not actively receiving an infusion. General instructions  Return to your normal activities as told by your health care provider. Ask your health care provider what activities are safe for you. Carry a medical alert card or wear a medical alert bracelet at all times. This will let health care providers know that you have an implanted port in case of an emergency. Where to find more information American [employer] Society: www.cancer.org American Society of Clinical Oncology: www.cancer.net Contact a health care provider if: You have a fever or chills. You have redness, swelling, or pain at the port site. You have fluid or blood coming from your port site. Your incision feels warm to the touch. You have pus or a bad smell coming from the port site. Summary Implanted ports are usually placed in the chest for long-term IV access. Follow instructions from your health care provider about flushing the port and changing bandages (dressings). Take care of the area around your port by avoiding clothing that puts pressure on the area, and by watching for signs of infection. Protect the skin around your port from seat belts. Place a soft pad over your chest if needed. Contact a health care provider if you have a fever or you have redness, swelling, pain, fluid, or a bad smell at the port site. This information is not intended to replace advice given to you by your health care provider. Make sure you discuss any questions you have with your health care provider. Document Revised: 05/07/2021 Document Reviewed: 05/07/2021 [employer] Patient Education  2023 [employer]  Rehydration, Adult Rehydration is the replacement of body fluids, salts, and minerals (electrolytes) that are lost during dehydration. Dehydration is when there is not enough water or other fluids in the body. This happens when you lose more fluids than you take in. Common causes of dehydration include: Not drinking enough fluids. This can occur when you are ill or doing activities that require a lot of energy, especially in hot weather. Conditions that cause loss of water or other fluids, such as diarrhea, vomiting, sweating, or urinating a lot. Other illnesses, such as fever or infection. Certain medicines, such as those that remove excess fluid from the body (diuretics). Symptoms of mild or moderate dehydration may include thirst, dry lips and mouth, and dizziness. Symptoms of severe dehydration may include increased heart rate, confusion, fainting, and not urinating. For severe dehydration, you may need to get fluids through an IV at the hospital. For mild or moderate dehydration, you   can usually rehydrate at home by drinking certain fluids as told by your health care provider. What are the risks? Generally, rehydration is safe. However, taking in too much fluid (overhydration) can be a problem. This is rare. Overhydration can cause an electrolyte imbalance, kidney failure, or a decrease in salt (sodium) levels in the body. Supplies needed You will need an oral rehydration solution (ORS) if your health care provider tells you to use one. This is a drink to treat dehydration. It can be found in pharmacies and retail stores. How to rehydrate Fluids Follow instructions from your health care provider for rehydration. The kind of fluid and the amount you should drink depend on your condition. In general, you should choose drinks that you prefer. If told by your health care provider, drink an ORS. Make an ORS by following instructions on the package. Start by drinking small amounts, about   cup (120 mL) every 5-10 minutes. Slowly increase how much you drink until you have taken the amount recommended by your health care provider. Drink enough clear fluids to keep your urine pale yellow. If you were told to drink an ORS, finish it first, then start slowly drinking other clear fluids. Drink fluids such as: Water. This includes sparkling water and flavored water. Drinking only water can lead to having too little sodium in your body (hyponatremia). Follow the advice of your health care provider. Water from ice chips you suck on. Fruit juice with water you add to it (diluted). Sports drinks. Hot or cold herbal teas. Broth-based soups. Milk or milk products. Food Follow instructions from your health care provider about what to eat while you rehydrate. Your health care provider may recommend that you slowly begin eating regular foods in small amounts. Eat foods that contain a healthy balance of electrolytes, such as bananas, oranges, potatoes, tomatoes, and spinach. Avoid foods that are greasy or contain a lot of sugar. In some cases, you may get nutrition through a feeding tube that is passed through your nose and into your stomach (nasogastric tube, or NG tube). This may be done if you have uncontrolled vomiting or diarrhea. Beverages to avoid  Certain beverages may make dehydration worse. While you rehydrate, avoid drinking alcohol. How to tell if you are recovering from dehydration You may be recovering from dehydration if: You are urinating more often than before you started rehydrating. Your urine is pale yellow. Your energy level improves. You vomit less frequently. You have diarrhea less frequently. Your appetite improves or returns to normal. You feel less dizzy or less light-headed. Your skin tone and color start to look more normal. Follow these instructions at home: Take over-the-counter and prescription medicines only as told by your health care provider. Do not take  sodium tablets. Doing this can lead to having too much sodium in your body (hypernatremia). Contact a health care provider if: You continue to have symptoms of mild or moderate dehydration, such as: Thirst. Dry lips. Slightly dry mouth. Dizziness. Dark urine or less urine than normal. Muscle cramps. You continue to vomit or have diarrhea. Get help right away if you: Have symptoms of dehydration that get worse. Have a fever. Have a severe headache. Have been vomiting and the following happens: Your vomiting gets worse or does not go away. Your vomit includes blood or green matter (bile). You cannot eat or drink without vomiting. Have problems with urination or bowel movements, such as: Diarrhea that gets worse or does not go away. Blood in your   stool (feces). This may cause stool to look black and tarry. Not urinating, or urinating only a small amount of very dark urine, within 6-8 hours. Have trouble breathing. Have symptoms that get worse with treatment. These symptoms may represent a serious problem that is an emergency. Do not wait to see if the symptoms will go away. Get medical help right away. Call your local emergency services (911 in the [place] Do not drive yourself to the hospital. Summary Rehydration is the replacement of body fluids and minerals (electrolytes) that are lost during dehydration. Follow instructions from your health care provider for rehydration. The kind of fluid and amount you should drink depend on your condition. Slowly increase how much you drink until you have taken the amount recommended by your health care provider. Contact your health care provider if you continue to show signs of mild or moderate dehydration. This information is not intended to replace advice given to you by your health care provider. Make sure you discuss any questions you have with your health care provider. Document Revised: 01/04/2020 Document Reviewed: 11/14/2019 [employer]  Patient Education  2023 [employer]  

## 2022-04-29 ENCOUNTER — Other Ambulatory Visit: Payer: Self-pay

## 2022-04-29 DIAGNOSIS — C20 Malignant neoplasm of rectum: Secondary | ICD-10-CM

## 2022-04-30 ENCOUNTER — Inpatient Hospital Stay: Payer: BC Managed Care – PPO

## 2022-04-30 VITALS — BP 110/80 | HR 71 | Temp 97.8°F | Resp 18 | Ht 71.0 in | Wt 188.0 lb

## 2022-04-30 DIAGNOSIS — Z923 Personal history of irradiation: Secondary | ICD-10-CM | POA: Diagnosis not present

## 2022-04-30 DIAGNOSIS — T451X5D Adverse effect of antineoplastic and immunosuppressive drugs, subsequent encounter: Secondary | ICD-10-CM | POA: Diagnosis not present

## 2022-04-30 DIAGNOSIS — L409 Psoriasis, unspecified: Secondary | ICD-10-CM | POA: Diagnosis not present

## 2022-04-30 DIAGNOSIS — Z95828 Presence of other vascular implants and grafts: Secondary | ICD-10-CM

## 2022-04-30 DIAGNOSIS — D61810 Antineoplastic chemotherapy induced pancytopenia: Secondary | ICD-10-CM | POA: Diagnosis not present

## 2022-04-30 DIAGNOSIS — Z5189 Encounter for other specified aftercare: Secondary | ICD-10-CM | POA: Diagnosis not present

## 2022-04-30 DIAGNOSIS — M109 Gout, unspecified: Secondary | ICD-10-CM | POA: Diagnosis not present

## 2022-04-30 DIAGNOSIS — C20 Malignant neoplasm of rectum: Secondary | ICD-10-CM | POA: Diagnosis not present

## 2022-04-30 DIAGNOSIS — I10 Essential (primary) hypertension: Secondary | ICD-10-CM | POA: Diagnosis not present

## 2022-04-30 DIAGNOSIS — Z5111 Encounter for antineoplastic chemotherapy: Secondary | ICD-10-CM | POA: Diagnosis not present

## 2022-04-30 MED ORDER — SODIUM CHLORIDE 0.9 % IV SOLN: Freq: Once | INTRAVENOUS | Status: AC

## 2022-04-30 MED ORDER — SODIUM CHLORIDE 0.9% FLUSH
10.0000 mL | Freq: Once | INTRAVENOUS | Status: AC
  Administered 2022-04-30: 10 mL via INTRAVENOUS

## 2022-04-30 MED ORDER — HEPARIN SOD (PORK) LOCK FLUSH 100 UNIT/ML IV SOLN
500.0000 [IU] | Freq: Once | INTRAVENOUS | Status: AC
  Administered 2022-04-30: 500 [IU] via INTRAVENOUS

## 2022-04-30 NOTE — Patient Instructions (Signed)
Rehydration, Adult Rehydration is the replacement of body fluids, salts, and minerals (electrolytes) that are lost during dehydration. Dehydration is when there is not enough water or other fluids in the body. This happens when you lose more fluids than you take in. Common causes of dehydration include: Not drinking enough fluids. This can occur when you are ill or doing activities that require a lot of energy, especially in hot weather. Conditions that cause loss of water or other fluids, such as diarrhea, vomiting, sweating, or urinating a lot. Other illnesses, such as fever or infection. Certain medicines, such as those that remove excess fluid from the body (diuretics). Symptoms of mild or moderate dehydration may include thirst, dry lips and mouth, and dizziness. Symptoms of severe dehydration may include increased heart rate, confusion, fainting, and not urinating. For severe dehydration, you may need to get fluids through an IV at the hospital. For mild or moderate dehydration, you can usually rehydrate at home by drinking certain fluids as told by your health care provider. What are the risks? Generally, rehydration is safe. However, taking in too much fluid (overhydration) can be a problem. This is rare. Overhydration can cause an electrolyte imbalance, kidney failure, or a decrease in salt (sodium) levels in the body. Supplies needed You will need an oral rehydration solution (ORS) if your health care provider tells you to use one. This is a drink to treat dehydration. It can be found in pharmacies and retail stores. How to rehydrate Fluids Follow instructions from your health care provider for rehydration. The kind of fluid and the amount you should drink depend on your condition. In general, you should choose drinks that you prefer. If told by your health care provider, drink an ORS. Make an ORS by following instructions on the package. Start by drinking small amounts, about  cup (120  mL) every 5-10 minutes. Slowly increase how much you drink until you have taken the amount recommended by your health care provider. Drink enough clear fluids to keep your urine pale yellow. If you were told to drink an ORS, finish it first, then start slowly drinking other clear fluids. Drink fluids such as: Water. This includes sparkling water and flavored water. Drinking only water can lead to having too little sodium in your body (hyponatremia). Follow the advice of your health care provider. Water from ice chips you suck on. Fruit juice with water you add to it (diluted). Sports drinks. Hot or cold herbal teas. Broth-based soups. Milk or milk products. Food Follow instructions from your health care provider about what to eat while you rehydrate. Your health care provider may recommend that you slowly begin eating regular foods in small amounts. Eat foods that contain a healthy balance of electrolytes, such as bananas, oranges, potatoes, tomatoes, and spinach. Avoid foods that are greasy or contain a lot of sugar. In some cases, you may get nutrition through a feeding tube that is passed through your nose and into your stomach (nasogastric tube, or NG tube). This may be done if you have uncontrolled vomiting or diarrhea. Beverages to avoid  Certain beverages may make dehydration worse. While you rehydrate, avoid drinking alcohol. How to tell if you are recovering from dehydration You may be recovering from dehydration if: You are urinating more often than before you started rehydrating. Your urine is pale yellow. Your energy level improves. You vomit less frequently. You have diarrhea less frequently. Your appetite improves or returns to normal. You feel less dizzy or less light-headed.   Your skin tone and color start to look more normal. Follow these instructions at home: Take over-the-counter and prescription medicines only as told by your health care provider. Do not take sodium  tablets. Doing this can lead to having too much sodium in your body (hypernatremia). Contact a health care provider if: You continue to have symptoms of mild or moderate dehydration, such as: Thirst. Dry lips. Slightly dry mouth. Dizziness. Dark urine or less urine than normal. Muscle cramps. You continue to vomit or have diarrhea. Get help right away if you: Have symptoms of dehydration that get worse. Have a fever. Have a severe headache. Have been vomiting and the following happens: Your vomiting gets worse or does not go away. Your vomit includes blood or green matter (bile). You cannot eat or drink without vomiting. Have problems with urination or bowel movements, such as: Diarrhea that gets worse or does not go away. Blood in your stool (feces). This may cause stool to look black and tarry. Not urinating, or urinating only a small amount of very dark urine, within 6-8 hours. Have trouble breathing. Have symptoms that get worse with treatment. These symptoms may represent a serious problem that is an emergency. Do not wait to see if the symptoms will go away. Get medical help right away. Call your local emergency services (911 in the [place] Do not drive yourself to the hospital. Summary Rehydration is the replacement of body fluids and minerals (electrolytes) that are lost during dehydration. Follow instructions from your health care provider for rehydration. The kind of fluid and amount you should drink depend on your condition. Slowly increase how much you drink until you have taken the amount recommended by your health care provider. Contact your health care provider if you continue to show signs of mild or moderate dehydration. This information is not intended to replace advice given to you by your health care provider. Make sure you discuss any questions you have with your health care provider. Document Revised: 01/04/2020 Document Reviewed: 11/14/2019 Elsevier Patient  Education  2023 [employer]  

## 2022-05-02 ENCOUNTER — Inpatient Hospital Stay: Payer: BC Managed Care – PPO

## 2022-05-02 DIAGNOSIS — C20 Malignant neoplasm of rectum: Secondary | ICD-10-CM | POA: Diagnosis not present

## 2022-05-02 DIAGNOSIS — L409 Psoriasis, unspecified: Secondary | ICD-10-CM | POA: Diagnosis not present

## 2022-05-02 DIAGNOSIS — D61810 Antineoplastic chemotherapy induced pancytopenia: Secondary | ICD-10-CM | POA: Diagnosis not present

## 2022-05-02 DIAGNOSIS — M109 Gout, unspecified: Secondary | ICD-10-CM | POA: Diagnosis not present

## 2022-05-02 DIAGNOSIS — Z5189 Encounter for other specified aftercare: Secondary | ICD-10-CM | POA: Diagnosis not present

## 2022-05-02 DIAGNOSIS — I10 Essential (primary) hypertension: Secondary | ICD-10-CM | POA: Diagnosis not present

## 2022-05-02 DIAGNOSIS — Z5111 Encounter for antineoplastic chemotherapy: Secondary | ICD-10-CM | POA: Diagnosis not present

## 2022-05-02 DIAGNOSIS — T451X5D Adverse effect of antineoplastic and immunosuppressive drugs, subsequent encounter: Secondary | ICD-10-CM | POA: Diagnosis not present

## 2022-05-02 DIAGNOSIS — Z923 Personal history of irradiation: Secondary | ICD-10-CM | POA: Diagnosis not present

## 2022-05-02 MED ORDER — SODIUM CHLORIDE 0.9 % IV SOLN: Freq: Once | INTRAVENOUS | Status: AC

## 2022-05-02 MED ORDER — HEPARIN SOD (PORK) LOCK FLUSH 100 UNIT/ML IV SOLN
500.0000 [IU] | Freq: Once | INTRAVENOUS | Status: AC
  Administered 2022-05-02: 500 [IU] via INTRAVENOUS

## 2022-05-02 MED ORDER — SODIUM CHLORIDE 0.9% FLUSH
10.0000 mL | Freq: Once | INTRAVENOUS | Status: AC
  Administered 2022-05-02: 10 mL via INTRAVENOUS

## 2022-05-02 NOTE — Patient Instructions (Signed)
Rehydration, Adult Rehydration is the replacement of body fluids, salts, and minerals (electrolytes) that are lost during dehydration. Dehydration is when there is not enough water or other fluids in the body. This happens when you lose more fluids than you take in. Common causes of dehydration include: Not drinking enough fluids. This can occur when you are ill or doing activities that require a lot of energy, especially in hot weather. Conditions that cause loss of water or other fluids, such as diarrhea, vomiting, sweating, or urinating a lot. Other illnesses, such as fever or infection. Certain medicines, such as those that remove excess fluid from the body (diuretics). Symptoms of mild or moderate dehydration may include thirst, dry lips and mouth, and dizziness. Symptoms of severe dehydration may include increased heart rate, confusion, fainting, and not urinating. For severe dehydration, you may need to get fluids through an IV at the hospital. For mild or moderate dehydration, you can usually rehydrate at home by drinking certain fluids as told by your health care provider. What are the risks? Generally, rehydration is safe. However, taking in too much fluid (overhydration) can be a problem. This is rare. Overhydration can cause an electrolyte imbalance, kidney failure, or a decrease in salt (sodium) levels in the body. Supplies needed You will need an oral rehydration solution (ORS) if your health care provider tells you to use one. This is a drink to treat dehydration. It can be found in pharmacies and retail stores. How to rehydrate Fluids Follow instructions from your health care provider for rehydration. The kind of fluid and the amount you should drink depend on your condition. In general, you should choose drinks that you prefer. If told by your health care provider, drink an ORS. Make an ORS by following instructions on the package. Start by drinking small amounts, about  cup (120  mL) every 5-10 minutes. Slowly increase how much you drink until you have taken the amount recommended by your health care provider. Drink enough clear fluids to keep your urine pale yellow. If you were told to drink an ORS, finish it first, then start slowly drinking other clear fluids. Drink fluids such as: Water. This includes sparkling water and flavored water. Drinking only water can lead to having too little sodium in your body (hyponatremia). Follow the advice of your health care provider. Water from ice chips you suck on. Fruit juice with water you add to it (diluted). Sports drinks. Hot or cold herbal teas. Broth-based soups. Milk or milk products. Food Follow instructions from your health care provider about what to eat while you rehydrate. Your health care provider may recommend that you slowly begin eating regular foods in small amounts. Eat foods that contain a healthy balance of electrolytes, such as bananas, oranges, potatoes, tomatoes, and spinach. Avoid foods that are greasy or contain a lot of sugar. In some cases, you may get nutrition through a feeding tube that is passed through your nose and into your stomach (nasogastric tube, or NG tube). This may be done if you have uncontrolled vomiting or diarrhea. Beverages to avoid  Certain beverages may make dehydration worse. While you rehydrate, avoid drinking alcohol. How to tell if you are recovering from dehydration You may be recovering from dehydration if: You are urinating more often than before you started rehydrating. Your urine is pale yellow. Your energy level improves. You vomit less frequently. You have diarrhea less frequently. Your appetite improves or returns to normal. You feel less dizzy or less light-headed.   Your skin tone and color start to look more normal. Follow these instructions at home: Take over-the-counter and prescription medicines only as told by your health care provider. Do not take sodium  tablets. Doing this can lead to having too much sodium in your body (hypernatremia). Contact a health care provider if: You continue to have symptoms of mild or moderate dehydration, such as: Thirst. Dry lips. Slightly dry mouth. Dizziness. Dark urine or less urine than normal. Muscle cramps. You continue to vomit or have diarrhea. Get help right away if you: Have symptoms of dehydration that get worse. Have a fever. Have a severe headache. Have been vomiting and the following happens: Your vomiting gets worse or does not go away. Your vomit includes blood or green matter (bile). You cannot eat or drink without vomiting. Have problems with urination or bowel movements, such as: Diarrhea that gets worse or does not go away. Blood in your stool (feces). This may cause stool to look black and tarry. Not urinating, or urinating only a small amount of very dark urine, within 6-8 hours. Have trouble breathing. Have symptoms that get worse with treatment. These symptoms may represent a serious problem that is an emergency. Do not wait to see if the symptoms will go away. Get medical help right away. Call your local emergency services (911 in the [place] Do not drive yourself to the hospital. Summary Rehydration is the replacement of body fluids and minerals (electrolytes) that are lost during dehydration. Follow instructions from your health care provider for rehydration. The kind of fluid and amount you should drink depend on your condition. Slowly increase how much you drink until you have taken the amount recommended by your health care provider. Contact your health care provider if you continue to show signs of mild or moderate dehydration. This information is not intended to replace advice given to you by your health care provider. Make sure you discuss any questions you have with your health care provider. Document Revised: 01/04/2020 Document Reviewed: 11/14/2019 Elsevier Patient  Education  2023 [employer]  

## 2022-05-03 ENCOUNTER — Other Ambulatory Visit: Payer: Self-pay | Admitting: Oncology

## 2022-05-05 ENCOUNTER — Encounter: Payer: Self-pay | Admitting: Nurse Practitioner

## 2022-05-05 ENCOUNTER — Other Ambulatory Visit: Payer: Self-pay | Admitting: *Deleted

## 2022-05-05 ENCOUNTER — Inpatient Hospital Stay: Payer: BC Managed Care – PPO

## 2022-05-05 ENCOUNTER — Other Ambulatory Visit: Payer: Self-pay

## 2022-05-05 ENCOUNTER — Inpatient Hospital Stay: Payer: BC Managed Care – PPO | Admitting: Nurse Practitioner

## 2022-05-05 ENCOUNTER — Encounter: Payer: Self-pay | Admitting: *Deleted

## 2022-05-05 VITALS — BP 113/75 | HR 75 | Temp 98.2°F | Resp 20 | Ht 71.0 in | Wt 186.0 lb

## 2022-05-05 DIAGNOSIS — C20 Malignant neoplasm of rectum: Secondary | ICD-10-CM

## 2022-05-05 DIAGNOSIS — M109 Gout, unspecified: Secondary | ICD-10-CM | POA: Diagnosis not present

## 2022-05-05 DIAGNOSIS — T451X5D Adverse effect of antineoplastic and immunosuppressive drugs, subsequent encounter: Secondary | ICD-10-CM | POA: Diagnosis not present

## 2022-05-05 DIAGNOSIS — Z932 Ileostomy status: Secondary | ICD-10-CM | POA: Diagnosis not present

## 2022-05-05 DIAGNOSIS — I10 Essential (primary) hypertension: Secondary | ICD-10-CM | POA: Diagnosis not present

## 2022-05-05 DIAGNOSIS — D61810 Antineoplastic chemotherapy induced pancytopenia: Secondary | ICD-10-CM | POA: Diagnosis not present

## 2022-05-05 DIAGNOSIS — Z5111 Encounter for antineoplastic chemotherapy: Secondary | ICD-10-CM | POA: Diagnosis not present

## 2022-05-05 DIAGNOSIS — Z5189 Encounter for other specified aftercare: Secondary | ICD-10-CM | POA: Diagnosis not present

## 2022-05-05 DIAGNOSIS — Z923 Personal history of irradiation: Secondary | ICD-10-CM | POA: Diagnosis not present

## 2022-05-05 DIAGNOSIS — L409 Psoriasis, unspecified: Secondary | ICD-10-CM | POA: Diagnosis not present

## 2022-05-05 LAB — CMP (CANCER CENTER ONLY)
ALT: 26 U/L (ref 0–44)
AST: 32 U/L (ref 15–41)
Albumin: 4.2 g/dL (ref 3.5–5.0)
Alkaline Phosphatase: 186 U/L — ABNORMAL HIGH (ref 38–126)
Anion gap: 10 (ref 5–15)
BUN: 16 mg/dL (ref 6–20)
CO2: 29 mmol/L (ref 22–32)
Calcium: 9.8 mg/dL (ref 8.9–10.3)
Chloride: 97 mmol/L — ABNORMAL LOW (ref 98–111)
Creatinine: 1.37 mg/dL — ABNORMAL HIGH (ref 0.61–1.24)
GFR, Estimated: 59 mL/min — ABNORMAL LOW (ref >60)
Glucose, Bld: 118 mg/dL — ABNORMAL HIGH (ref 70–99)
Potassium: 3.5 mmol/L (ref 3.5–5.1)
Sodium: 136 mmol/L (ref 135–145)
Total Bilirubin: 0.6 mg/dL (ref 0.3–1.2)
Total Protein: 7.2 g/dL (ref 6.5–8.1)

## 2022-05-05 LAB — CBC WITH DIFFERENTIAL (CANCER CENTER ONLY)
Abs Immature Granulocytes: 0.31 10*3/uL — ABNORMAL HIGH (ref 0.00–0.07)
Basophils Absolute: 0.1 10*3/uL (ref 0.0–0.1)
Basophils Relative: 1 %
Eosinophils Absolute: 0.1 10*3/uL (ref 0.0–0.5)
Eosinophils Relative: 2 %
HCT: 36.7 % — ABNORMAL LOW (ref 39.0–52.0)
Hemoglobin: 12.3 g/dL — ABNORMAL LOW (ref 13.0–17.0)
Immature Granulocytes: 4 %
Lymphocytes Relative: 19 %
Lymphs Abs: 1.4 10*3/uL (ref 0.7–4.0)
MCH: 29.1 pg (ref 26.0–34.0)
MCHC: 33.5 g/dL (ref 30.0–36.0)
MCV: 86.8 fL (ref 80.0–100.0)
Monocytes Absolute: 0.8 10*3/uL (ref 0.1–1.0)
Monocytes Relative: 11 %
Neutro Abs: 4.5 10*3/uL (ref 1.7–7.7)
Neutrophils Relative %: 63 %
Platelet Count: 90 10*3/uL — ABNORMAL LOW (ref 150–400)
RBC: 4.23 MIL/uL (ref 4.22–5.81)
RDW: 21.6 % — ABNORMAL HIGH (ref 11.5–15.5)
WBC Count: 7.2 10*3/uL (ref 4.0–10.5)
nRBC: 0.4 % — ABNORMAL HIGH (ref 0.0–0.2)

## 2022-05-05 MED ORDER — OXALIPLATIN CHEMO INJECTION 100 MG/20ML
65.0000 mg/m2 | Freq: Once | INTRAVENOUS | Status: AC
  Administered 2022-05-05: 135 mg via INTRAVENOUS
  Filled 2022-05-05: qty 20

## 2022-05-05 MED ORDER — DEXTROSE 5 % IV SOLN: Freq: Once | INTRAVENOUS | Status: AC

## 2022-05-05 MED ORDER — SODIUM CHLORIDE 0.9 % IV SOLN
10.0000 mg | Freq: Once | INTRAVENOUS | Status: AC
  Administered 2022-05-05: 10 mg via INTRAVENOUS
  Filled 2022-05-05: qty 1

## 2022-05-05 MED ORDER — SODIUM CHLORIDE 0.9 % IV SOLN
5000.0000 mg | INTRAVENOUS | Status: DC
  Administered 2022-05-05: 5000 mg via INTRAVENOUS
  Filled 2022-05-05: qty 100

## 2022-05-05 MED ORDER — PALONOSETRON HCL INJECTION 0.25 MG/5ML
0.2500 mg | Freq: Once | INTRAVENOUS | Status: AC
  Administered 2022-05-05: 0.25 mg via INTRAVENOUS
  Filled 2022-05-05: qty 5

## 2022-05-05 MED ORDER — SODIUM CHLORIDE 0.9 % IV SOLN: INTRAVENOUS | Status: AC

## 2022-05-05 MED ORDER — LEUCOVORIN CALCIUM INJECTION 350 MG
400.0000 mg/m2 | Freq: Once | INTRAVENOUS | Status: AC
  Administered 2022-05-05: 840 mg via INTRAVENOUS
  Filled 2022-05-05: qty 25

## 2022-05-05 MED ORDER — FLUOROURACIL CHEMO INJECTION 2.5 GM/50ML
400.0000 mg/m2 | Freq: Once | INTRAVENOUS | Status: AC
  Administered 2022-05-05: 850 mg via INTRAVENOUS
  Filled 2022-05-05: qty 17

## 2022-05-05 NOTE — Patient Instructions (Signed)
Kenneth [name]  The chemotherapy medication bag should finish at 46 hours, 96 hours, or 7 days. For example, if your pump is scheduled for 46 hours and it was put on at 4:00 p.m., it should finish at 2:00 p.m. the day it is scheduled to come off regardless of your appointment time.     Estimated time to finish at 10:30 Thursday, April 24, 2022.   If the display on your pump reads "Low Volume" and it is beeping, take the batteries out of the pump and come to the cancer center for it to be taken off.   If the pump alarms go off prior to the pump reading "Low Volume" then call [phone] and someone can assist you.  If the plunger comes out and the chemotherapy medication is leaking out, please use your home chemo spill kit to clean up the spill. Do NOT use paper towels or other household products.  If you have problems or questions regarding your pump, please call either [phone] (24 hours a day) or the cancer center Monday-Friday 8:00 a.m.- 4:30 p.m. at the clinic number and we will assist you. If you are unable to get assistance, then go to the nearest Emergency Department and ask the staff to contact the IV team for assistance.   Discharge Instructions: Thank you for choosing [facility] to provide your oncology and hematology care.   If you have a lab appointment with the [facility], please go directly to the [facility] and check in at the registration area.   Wear comfortable clothing and clothing appropriate for easy access to any Portacath or PICC line.   We strive to give you quality time with your provider. You may need to reschedule your appointment if you arrive late (15 or more minutes).  Arriving late affects you and other patients whose appointments are after yours.  Also, if you miss three or more appointments without notifying the office, you may be dismissed from the clinic at the provider's discretion.      For prescription  refill requests, have your pharmacy contact our office and allow 72 hours for refills to be completed.    Today you received the following chemotherapy and/or immunotherapy agents Oxaliplatin, Leucovorin, Fluorouracil.      To help prevent nausea and vomiting after your treatment, we encourage you to take your nausea medication as directed.  BELOW ARE SYMPTOMS THAT SHOULD BE REPORTED IMMEDIATELY: *FEVER GREATER THAN 100.4 F (38 C) OR HIGHER *CHILLS OR SWEATING *NAUSEA AND VOMITING THAT IS NOT CONTROLLED WITH YOUR NAUSEA MEDICATION *UNUSUAL SHORTNESS OF BREATH *UNUSUAL BRUISING OR BLEEDING *URINARY PROBLEMS (pain or burning when urinating, or frequent urination) *BOWEL PROBLEMS (unusual diarrhea, constipation, pain near the anus) TENDERNESS IN MOUTH AND THROAT WITH OR WITHOUT PRESENCE OF ULCERS (sore throat, sores in mouth, or a toothache) UNUSUAL RASH, SWELLING OR PAIN  UNUSUAL VAGINAL DISCHARGE OR ITCHING   Items with * indicate a potential emergency and should be followed up as soon as possible or go to the Emergency Department if any problems should occur.  Please show the CHEMOTHERAPY ALERT CARD or IMMUNOTHERAPY ALERT CARD at check-in to the Emergency Department and triage nurse.  Should you have questions after your visit or need to cancel or reschedule your appointment, please contact [facility]  Dept: [phone]  and follow the prompts.  Office hours are 8:00 a.m. to 4:30 p.m. Monday - Friday. Please note that voicemails  left after 4:00 p.m. may not be returned until the following business day.  We are closed weekends and major holidays. You have access to a nurse at all times for urgent questions. Please call the main number to the clinic Dept: [phone] and follow the prompts.   For any non-urgent questions, you may also contact your provider using MyChart. We now offer e-Visits for anyone 36 and older to request care online for non-urgent  symptoms. For details visit [email or web address].   Also download the MyChart app! Go to the app store, search "MyChart", open the app, select [facility], and log in with your MyChart username and password.  Due to Covid, a mask is required upon entering the hospital/clinic. If you do not have a mask, one will be given to you upon arrival. For doctor visits, patients may have 1 support person aged 2 or older with them. For treatment visits, patients cannot have anyone with them due to current Covid guidelines and our immunocompromised population.   Oxaliplatin Injection What is this medication? OXALIPLATIN (ox AL i PLA tin) is a chemotherapy drug. It targets fast dividing cells, like cancer cells, and causes these cells to die. This medicine is used to treat cancers of the colon and rectum, and many other cancers. This medicine may be used for other purposes; ask your health care provider or pharmacist if you have questions. COMMON BRAND NAME(S): Eloxatin What should I tell my care team before I take this medication? They need to know if you have any of these conditions: heart disease history of irregular heartbeat liver disease low blood counts, like white cells, platelets, or red blood cells lung or breathing disease, like asthma take medicines that treat or prevent blood clots tingling of the fingers or toes, or other nerve disorder an unusual or allergic reaction to oxaliplatin, other chemotherapy, other medicines, foods, dyes, or preservatives pregnant or trying to get pregnant breast-feeding How should I use this medication? This drug is given as an infusion into a vein. It is administered in a hospital or clinic by a specially trained health care professional. Talk to your pediatrician regarding the use of this medicine in children. Special care may be needed. Overdosage: If you think you have taken too much of this medicine contact a poison control center or emergency room at  once. NOTE: This medicine is only for you. Do not share this medicine with others. What if I miss a dose? It is important not to miss a dose. Call your doctor or health care professional if you are unable to keep an appointment. What may interact with this medication? Do not take this medicine with any of the following medications: cisapride dronedarone pimozide thioridazine This medicine may also interact with the following medications: aspirin and aspirin-like medicines certain medicines that treat or prevent blood clots like warfarin, apixaban, dabigatran, and rivaroxaban cisplatin cyclosporine diuretics medicines for infection like acyclovir, adefovir, amphotericin B, bacitracin, cidofovir, foscarnet, ganciclovir, gentamicin, pentamidine, vancomycin NSAIDs, medicines for pain and inflammation, like ibuprofen or naproxen other medicines that prolong the QT interval (an abnormal heart rhythm) pamidronate zoledronic acid This list may not describe all possible interactions. Give your health care provider a list of all the medicines, herbs, non-prescription drugs, or dietary supplements you use. Also tell them if you smoke, drink alcohol, or use illegal drugs. Some items may interact with your medicine. What should I watch for while using this medication? Your condition will be monitored carefully while you are receiving this  medicine. You may need blood work done while you are taking this medicine. This medicine may make you feel generally unwell. This is not uncommon as chemotherapy can affect healthy cells as well as cancer cells. Report any side effects. Continue your course of treatment even though you feel ill unless your healthcare professional tells you to stop. This medicine can make you more sensitive to cold. Do not drink cold drinks or use ice. Cover exposed skin before coming in contact with cold temperatures or cold objects. When out in cold weather wear warm clothing and  cover your mouth and nose to warm the air that goes into your lungs. Tell your doctor if you get sensitive to the cold. Do not become pregnant while taking this medicine or for 9 months after stopping it. Women should inform their health care professional if they wish to become pregnant or think they might be pregnant. Men should not father a child while taking this medicine and for 6 months after stopping it. There is potential for serious side effects to an unborn child. Talk to your health care professional for more information. Do not breast-feed a child while taking this medicine or for 3 months after stopping it. This medicine has caused ovarian failure in some women. This medicine may make it more difficult to get pregnant. Talk to your health care professional if you are concerned about your fertility. This medicine has caused decreased sperm counts in some men. This may make it more difficult to father a child. Talk to your health care professional if you are concerned about your fertility. This medicine may increase your risk of getting an infection. Call your health care professional for advice if you get a fever, chills, or sore throat, or other symptoms of a cold or flu. Do not treat yourself. Try to avoid being around people who are sick. Avoid taking medicines that contain aspirin, acetaminophen, ibuprofen, naproxen, or ketoprofen unless instructed by your health care professional. These medicines may hide a fever. Be careful brushing or flossing your teeth or using a toothpick because you may get an infection or bleed more easily. If you have any dental work done, tell your dentist you are receiving this medicine. What side effects may I notice from receiving this medication? Side effects that you should report to your doctor or health care professional as soon as possible: allergic reactions like skin rash, itching or hives, swelling of the face, lips, or tongue breathing  problems cough low blood counts - this medicine may decrease the number of white blood cells, red blood cells, and platelets. You may be at increased risk for infections and bleeding nausea, vomiting pain, redness, or irritation at site where injected pain, tingling, numbness in the hands or feet signs and symptoms of bleeding such as bloody or black, tarry stools; red or dark brown urine; spitting up blood or brown material that looks like coffee grounds; red spots on the skin; unusual bruising or bleeding from the eyes, gums, or nose signs and symptoms of a dangerous change in heartbeat or heart rhythm like chest pain; dizziness; fast, irregular heartbeat; palpitations; feeling faint or lightheaded; falls signs and symptoms of infection like fever; chills; cough; sore throat; pain or trouble passing urine signs and symptoms of liver injury like dark yellow or brown urine; general ill feeling or flu-like symptoms; light-colored stools; loss of appetite; nausea; right upper belly pain; unusually weak or tired; yellowing of the eyes or skin signs and symptoms of  low red blood cells or anemia such as unusually weak or tired; feeling faint or lightheaded; falls signs and symptoms of muscle injury like dark urine; trouble passing urine or change in the amount of urine; unusually weak or tired; muscle pain; back pain Side effects that usually do not require medical attention (report to your doctor or health care professional if they continue or are bothersome): changes in taste diarrhea gas hair loss loss of appetite mouth sores This list may not describe all possible side effects. Call your doctor for medical advice about side effects. You may report side effects to FDA at 1-800-FDA-1088. Where should I keep my medication? This drug is given in a hospital or clinic and will not be stored at home. NOTE: This sheet is a summary. It may not cover all possible information. If you have questions about  this medicine, talk to your doctor, pharmacist, or health care provider.  2023 Elsevier/Gold Standard (2021-10-04 00:00:00)  Leucovorin injection What is this medication? LEUCOVORIN (loo koe VOR in) is used to prevent or treat the harmful effects of some medicines. This medicine is used to treat anemia caused by a low amount of folic acid in the body. It is also used with 5-fluorouracil (5-FU) to treat colon cancer. This medicine may be used for other purposes; ask your health care provider or pharmacist if you have questions. What should I tell my care team before I take this medication? They need to know if you have any of these conditions: anemia from low levels of vitamin B-12 in the blood an unusual or allergic reaction to leucovorin, folic acid, other medicines, foods, dyes, or preservatives pregnant or trying to get pregnant breast-feeding How should I use this medication? This medicine is for injection into a muscle or into a vein. It is given by a health care professional in a hospital or clinic setting. Talk to your pediatrician regarding the use of this medicine in children. Special care may be needed. Overdosage: If you think you have taken too much of this medicine contact a poison control center or emergency room at once. NOTE: This medicine is only for you. Do not share this medicine with others. What if I miss a dose? This does not apply. What may interact with this medication? capecitabine fluorouracil phenobarbital phenytoin primidone trimethoprim-sulfamethoxazole This list may not describe all possible interactions. Give your health care provider a list of all the medicines, herbs, non-prescription drugs, or dietary supplements you use. Also tell them if you smoke, drink alcohol, or use illegal drugs. Some items may interact with your medicine. What should I watch for while using this medication? Your condition will be monitored carefully while you are receiving this  medicine. This medicine may increase the side effects of 5-fluorouracil, 5-FU. Tell your doctor or health care professional if you have diarrhea or mouth sores that do not get better or that get worse. What side effects may I notice from receiving this medication? Side effects that you should report to your doctor or health care professional as soon as possible: allergic reactions like skin rash, itching or hives, swelling of the face, lips, or tongue breathing problems fever, infection mouth sores unusual bleeding or bruising unusually weak or tired Side effects that usually do not require medical attention (report to your doctor or health care professional if they continue or are bothersome): constipation or diarrhea loss of appetite nausea, vomiting This list may not describe all possible side effects. Call your doctor for medical  advice about side effects. You may report side effects to FDA at 1-800-FDA-1088. Where should I keep my medication? This drug is given in a hospital or clinic and will not be stored at home. NOTE: This sheet is a summary. It may not cover all possible information. If you have questions about this medicine, talk to your doctor, pharmacist, or health care provider.  2023 Elsevier/Gold Standard (2008-05-11 00:00:00)  Fluorouracil, 5-FU injection What is this medication? FLUOROURACIL, 5-FU (flure oh YOOR a sil) is a chemotherapy drug. It slows the growth of cancer cells. This medicine is used to treat many types of cancer like breast cancer, colon or rectal cancer, pancreatic cancer, and stomach cancer. This medicine may be used for other purposes; ask your health care provider or pharmacist if you have questions. COMMON BRAND NAME(S): Adrucil What should I tell my care team before I take this medication? They need to know if you have any of these conditions: blood disorders dihydropyrimidine dehydrogenase (DPD) deficiency infection (especially a virus  infection such as chickenpox, cold sores, or herpes) kidney disease liver disease malnourished, poor nutrition recent or ongoing radiation therapy an unusual or allergic reaction to fluorouracil, other chemotherapy, other medicines, foods, dyes, or preservatives pregnant or trying to get pregnant breast-feeding How should I use this medication? This drug is given as an infusion or injection into a vein. It is administered in a hospital or clinic by a specially trained health care professional. Talk to your pediatrician regarding the use of this medicine in children. Special care may be needed. Overdosage: If you think you have taken too much of this medicine contact a poison control center or emergency room at once. NOTE: This medicine is only for you. Do not share this medicine with others. What if I miss a dose? It is important not to miss your dose. Call your doctor or health care professional if you are unable to keep an appointment. What may interact with this medication? Do not take this medicine with any of the following medications: live virus vaccines This medicine may also interact with the following medications: medicines that treat or prevent blood clots like warfarin, enoxaparin, and dalteparin This list may not describe all possible interactions. Give your health care provider a list of all the medicines, herbs, non-prescription drugs, or dietary supplements you use. Also tell them if you smoke, drink alcohol, or use illegal drugs. Some items may interact with your medicine. What should I watch for while using this medication? Visit your doctor for checks on your progress. This drug may make you feel generally unwell. This is not uncommon, as chemotherapy can affect healthy cells as well as cancer cells. Report any side effects. Continue your course of treatment even though you feel ill unless your doctor tells you to stop. In some cases, you may be given additional medicines to  help with side effects. Follow all directions for their use. Call your doctor or health care professional for advice if you get a fever, chills or sore throat, or other symptoms of a cold or flu. Do not treat yourself. This drug decreases your body's ability to fight infections. Try to avoid being around people who are sick. This medicine may increase your risk to bruise or bleed. Call your doctor or health care professional if you notice any unusual bleeding. Be careful brushing and flossing your teeth or using a toothpick because you may get an infection or bleed more easily. If you have any dental work done,  tell your dentist you are receiving this medicine. Avoid taking products that contain aspirin, acetaminophen, ibuprofen, naproxen, or ketoprofen unless instructed by your doctor. These medicines may hide a fever. Do not become pregnant while taking this medicine. Women should inform their doctor if they wish to become pregnant or think they might be pregnant. There is a potential for serious side effects to an unborn child. Talk to your health care professional or pharmacist for more information. Do not breast-feed an infant while taking this medicine. Men should inform their doctor if they wish to father a child. This medicine may lower sperm counts. Do not treat diarrhea with over the counter products. Contact your doctor if you have diarrhea that lasts more than 2 days or if it is severe and watery. This medicine can make you more sensitive to the sun. Keep out of the sun. If you cannot avoid being in the sun, wear protective clothing and use sunscreen. Do not use sun lamps or tanning beds/booths. What side effects may I notice from receiving this medication? Side effects that you should report to your doctor or health care professional as soon as possible: allergic reactions like skin rash, itching or hives, swelling of the face, lips, or tongue low blood counts - this medicine may decrease  the number of white blood cells, red blood cells and platelets. You may be at increased risk for infections and bleeding. signs of infection - fever or chills, cough, sore throat, pain or difficulty passing urine signs of decreased platelets or bleeding - bruising, pinpoint red spots on the skin, black, tarry stools, blood in the urine signs of decreased red blood cells - unusually weak or tired, fainting spells, lightheadedness breathing problems changes in vision chest pain mouth sores nausea and vomiting pain, swelling, redness at site where injected pain, tingling, numbness in the hands or feet redness, swelling, or sores on hands or feet stomach pain unusual bleeding Side effects that usually do not require medical attention (report to your doctor or health care professional if they continue or are bothersome): changes in finger or toe nails diarrhea dry or itchy skin hair loss headache loss of appetite sensitivity of eyes to the light stomach upset unusually teary eyes This list may not describe all possible side effects. Call your doctor for medical advice about side effects. You may report side effects to FDA at 1-800-FDA-1088. Where should I keep my medication? This drug is given in a hospital or clinic and will not be stored at home. NOTE: This sheet is a summary. It may not cover all possible information. If you have questions about this medicine, talk to your doctor, pharmacist, or health care provider.  2023 Elsevier/Gold Standard (2021-10-04 00:00:00) \[id number]\

## 2022-05-05 NOTE — Patient Instructions (Signed)

## 2022-05-05 NOTE — Progress Notes (Signed)
Patient seen by [name] NP today  Vitals are within treatment parameters.  Labs reviewed by [name] NP and are not all within treatment parameters. Platelet count 90,000--OK to treat. Oxaliplatin will continue to be dosed at 65 mg/m2  Per physician team, patient is ready for treatment and there are NO modifications to the treatment plan.  Will continue to receive IVF every MWF

## 2022-05-05 NOTE — Progress Notes (Signed)
  [facility] OFFICE PROGRESS NOTE   Diagnosis: Rectal cancer  INTERVAL HISTORY:   Kenneth [name] returns as scheduled.  He completed cycle 5 FOLFOX 04/22/2022.  Oxaliplatin was dose reduced due to thrombocytopenia.  He denies nausea/vomiting.  No mouth sores though he does note some mouth tenderness.  He continues Imodium and Lomotil as well as IV fluids 3 times weekly for the high output ileostomy.  Cold sensitivity last 5 to 6 days.  No numbness or tingling in the absence of cold exposure.  Objective:  Vital signs in last 24 hours:  Blood pressure 113/75, pulse 75, temperature 98.2 F (36.8 C), temperature source Oral, resp. rate 20, height $RemoveBe'5\' 11"'yAOXmNmLi$  (1.803 m), weight 186 lb (84.4 kg), SpO2 100 %.    HEENT: Patchy erythema at the posterior palate, no ulcers.  No thrush. Resp: Lungs clear bilaterally. Cardio: Regular rate and rhythm. GI: Abdomen soft and nontender.  No hepatosplenomegaly.  Right abdomen ileostomy. Vascular: No leg edema. Neuro: Vibratory sense intact over the fingertips per tuning fork exam. Skin: Palms without erythema.   Lab Results:  Lab Results  Component Value Date   WBC 7.2 05/05/2022   HGB 12.3 (L) 05/05/2022   HCT 36.7 (L) 05/05/2022   MCV 86.8 05/05/2022   PLT 90 (L) 05/05/2022   NEUTROABS 4.5 05/05/2022    Imaging:  No results found.  Medications: I have reviewed the patient's current medications.  Assessment/Plan: Rectal cancer-mass at 11 cm on proctoscopy by Dr. Morton Stall 02/04/2022 Colonoscopy 12/26/2021-obstructing mass at the rectosigmoid measured at 15 cm, biopsy invasive moderate to poorly differentiated adenocarcinoma, mismatch repair protein expression intact CTs 12/31/2021-masslike circumferential thickening of the distal sigmoid colon with mildly enlarged pericolonic lymph nodes, numerous subcentimeter hypodense liver lesions, splenomegaly MRI abdomen 01/07/2022-innumerable T2 hyperintense liver lesions consistent with cysts,  spleen unremarkable, no ascites or adenopathy, moderate colonic fecal retention, no bowel obstruction MRI pelvis 01/22/2022-T3cN2 tumor above and below the peritoneal reflection, tumor 11.3 cm from the anal verge and 6.1 cm from the sphincter complex, multiple enlarged perirectal nodes, 1.1 cm node versus tumor deposit at the right aspect of the tumor Diverting loop ileostomy 01/15/2022 Cycle 1 FOLFOX 02/17/2022 Cycle 2 FOLFOX 03/04/2022 Cycle 3 FOLFOX held on 03/17/2022 due to neutropenia Cycle 3 FOLFOX 03/17/2022, Udenyca Treatment held 03/31/2022 due to dehydration Cycle 4 FOLFOX 04/07/2022 Cycle 5 FOLFOX 04/22/2022, oxaliplatin dose reduced due to thrombocytopenia Cycle 6 FOLFOX 05/05/2022 Gout Psoriasis Hypertension Left scalene node versus cyst/lipoma on exam 02/03/2022-CT neck 02/10/2022 with 9.5 mm lymph node in the left supraclavicular region posterior to the sternocleidomastoid muscle.  No other prominent lymph nodes in the neck.  Biopsy 02/12/2022 compatible with benign lymph node, negative for metastatic carcinoma.  Disposition: Kenneth [name] appears stable.  He has completed 5 cycles of FOLFOX.  Oxaliplatin was dose reduced with cycle 5 due to thrombocytopenia.  Plan to proceed with cycle 6 today as scheduled, same oxaliplatin dose reduction as with cycle 5.  CBC and chemistry panel reviewed.  Labs adequate to proceed as above.  Platelet count is improved as compared to 2 weeks ago.  He will continue IV fluids 3 times per week and Imodium/Lomotil for the high output ileostomy.  He will return for lab, follow-up, cycle 7 FOLFOX in 2 weeks.    [name] ANP/GNP-BC   05/05/2022  10:12 AM

## 2022-05-05 NOTE — Progress Notes (Signed)
IV fluid orders entered for rest of this week.

## 2022-05-07 ENCOUNTER — Inpatient Hospital Stay: Payer: BC Managed Care – PPO

## 2022-05-07 VITALS — BP 108/77 | HR 65 | Temp 97.5°F | Resp 18

## 2022-05-07 DIAGNOSIS — C20 Malignant neoplasm of rectum: Secondary | ICD-10-CM | POA: Diagnosis not present

## 2022-05-07 DIAGNOSIS — I10 Essential (primary) hypertension: Secondary | ICD-10-CM | POA: Diagnosis not present

## 2022-05-07 DIAGNOSIS — Z5189 Encounter for other specified aftercare: Secondary | ICD-10-CM | POA: Diagnosis not present

## 2022-05-07 DIAGNOSIS — T451X5D Adverse effect of antineoplastic and immunosuppressive drugs, subsequent encounter: Secondary | ICD-10-CM | POA: Diagnosis not present

## 2022-05-07 DIAGNOSIS — Z5111 Encounter for antineoplastic chemotherapy: Secondary | ICD-10-CM | POA: Diagnosis not present

## 2022-05-07 DIAGNOSIS — Z923 Personal history of irradiation: Secondary | ICD-10-CM | POA: Diagnosis not present

## 2022-05-07 DIAGNOSIS — D61810 Antineoplastic chemotherapy induced pancytopenia: Secondary | ICD-10-CM | POA: Diagnosis not present

## 2022-05-07 DIAGNOSIS — M109 Gout, unspecified: Secondary | ICD-10-CM | POA: Diagnosis not present

## 2022-05-07 DIAGNOSIS — L409 Psoriasis, unspecified: Secondary | ICD-10-CM | POA: Diagnosis not present

## 2022-05-07 MED ORDER — SODIUM CHLORIDE 0.9 % IV SOLN: INTRAVENOUS | Status: AC

## 2022-05-07 MED ORDER — PEGFILGRASTIM-CBQV 6 MG/0.6ML ~~LOC~~ SOSY
6.0000 mg | PREFILLED_SYRINGE | Freq: Once | SUBCUTANEOUS | Status: AC
  Administered 2022-05-07: 6 mg via SUBCUTANEOUS
  Filled 2022-05-07: qty 0.6

## 2022-05-07 MED ORDER — HEPARIN SOD (PORK) LOCK FLUSH 100 UNIT/ML IV SOLN
500.0000 [IU] | Freq: Once | INTRAVENOUS | Status: AC | PRN
  Administered 2022-05-07: 500 [IU]

## 2022-05-07 MED ORDER — SODIUM CHLORIDE 0.9% FLUSH
10.0000 mL | INTRAVENOUS | Status: DC | PRN
  Administered 2022-05-07: 10 mL

## 2022-05-07 NOTE — Patient Instructions (Signed)
Implanted Port Home Guide An implanted port is a device that is placed under the skin. It is usually placed in the chest. The device may vary based on the need. Implanted ports can be used to give IV medicine, to take blood, or to give fluids. You may have an implanted port if: You need IV medicine that would be irritating to the small veins in your hands or arms. You need IV medicines, such as chemotherapy, for a long period of time. You need IV nutrition for a long period of time. You may have fewer limitations when using a port than you would if you used other types of long-term IVs. You will also likely be able to return to normal activities after your incision heals. An implanted port has two main parts: Reservoir. The reservoir is the part where a needle is inserted to give medicines or draw blood. The reservoir is round. After the port is placed, it appears as a small, raised area under your skin. Catheter. The catheter is a small, thin tube that connects the reservoir to a vein. Medicine that is inserted into the reservoir goes into the catheter and then into the vein. How is my port accessed? To access your port: A numbing cream may be placed on the skin over the port site. Your health care provider will put on a mask and sterile gloves. The skin over your port will be cleaned carefully with a germ-killing soap and allowed to dry. Your health care provider will gently pinch the port and insert a needle into it. Your health care provider will check for a blood return to make sure the port is in the vein and is still working (patent). If your port needs to remain accessed to get medicine continuously (constant infusion), your health care provider will place a clear bandage (dressing) over the needle site. The dressing and needle will need to be changed every week, or as told by your health care provider. What is flushing? Flushing helps keep the port working. Follow instructions from your  health care provider about how and when to flush the port. Ports are usually flushed with saline solution or a medicine called heparin. The need for flushing will depend on how the port is used: If the port is only used from time to time to give medicines or draw blood, the port may need to be flushed: Before and after medicines have been given. Before and after blood has been drawn. As part of routine maintenance. Flushing may be recommended every 4-6 weeks. If a constant infusion is running, the port may not need to be flushed. Throw away any syringes in a disposal container that is meant for sharp items (sharps container). You can buy a sharps container from a pharmacy, or you can make one by using an empty hard plastic bottle with a cover. How long will my port stay implanted? The port can stay in for as long as your health care provider thinks it is needed. When it is time for the port to come out, a surgery will be done to remove it. The surgery will be similar to the procedure that was done to put the port in. Follow these instructions at home: Caring for your port and port site Flush your port as told by your health care provider. If you need an infusion over several days, follow instructions from your health care provider about how to take care of your port site. Make sure you: Change your   dressing as told by your health care provider. Wash your hands with soap and water for at least 20 seconds before and after you change your dressing. If soap and water are not available, use alcohol-based hand sanitizer. Place any used dressings or infusion bags into a plastic bag. Throw that bag in the trash. Keep the dressing that covers the needle clean and dry. Do not get it wet. Do not use scissors or sharp objects near the infusion tubing. Keep any external tubes clamped, unless they are being used. Check your port site every day for signs of infection. Check for: Redness, swelling, or  pain. Fluid or blood. Warmth. Pus or a bad smell. Protect the skin around the port site. Avoid wearing bra straps that rub or irritate the site. Protect the skin around your port from seat belts. Place a soft pad over your chest if needed. Bathe or shower as told by your health care provider. The site may get wet as long as you are not actively receiving an infusion. General instructions  Return to your normal activities as told by your health care provider. Ask your health care provider what activities are safe for you. Carry a medical alert card or wear a medical alert bracelet at all times. This will let health care providers know that you have an implanted port in case of an emergency. Where to find more information American Cancer Society: www.cancer.[place] of Clinical Oncology: www.cancer.net Contact a health care provider if: You have a fever or chills. You have redness, swelling, or pain at the port site. You have fluid or blood coming from your port site. Your incision feels warm to the touch. You have pus or a bad smell coming from the port site. Summary Implanted ports are usually placed in the chest for long-term IV access. Follow instructions from your health care provider about flushing the port and changing bandages (dressings). Take care of the area around your port by avoiding clothing that puts pressure on the area, and by watching for signs of infection. Protect the skin around your port from seat belts. Place a soft pad over your chest if needed. Contact a health care provider if you have a fever or you have redness, swelling, pain, fluid, or a bad smell at the port site. This information is not intended to replace advice given to you by your health care provider. Make sure you discuss any questions you have with your health care provider. Document Revised: 05/07/2021 Document Reviewed: 05/07/2021 Elsevier Patient Education  [place]. Rehydration, Adult Rehydration is the replacement of body fluids, salts, and minerals (electrolytes) that are lost during dehydration. Dehydration is when there is not enough water or other fluids in the body. This happens when you lose more fluids than you take in. Common causes of dehydration include: Not drinking enough fluids. This can occur when you are ill or doing activities that require a lot of energy, especially in hot weather. Conditions that cause loss of water or other fluids, such as diarrhea, vomiting, sweating, or urinating a lot. Other illnesses, such as fever or infection. Certain medicines, such as those that remove excess fluid from the body (diuretics). Symptoms of mild or moderate dehydration may include thirst, dry lips and mouth, and dizziness. Symptoms of severe dehydration may include increased heart rate, confusion, fainting, and not urinating. For severe dehydration, you may need to get fluids through an IV at the hospital. For mild or moderate dehydration, you can  usually rehydrate at home by drinking certain fluids as told by your health care provider. What are the risks? Generally, rehydration is safe. However, taking in too much fluid (overhydration) can be a problem. This is rare. Overhydration can cause an electrolyte imbalance, kidney failure, or a decrease in salt (sodium) levels in the body. Supplies needed You will need an oral rehydration solution (ORS) if your health care provider tells you to use one. This is a drink to treat dehydration. It can be found in pharmacies and retail stores. How to rehydrate Fluids Follow instructions from your health care provider for rehydration. The kind of fluid and the amount you should drink depend on your condition. In general, you should choose drinks that you prefer. If told by your health care provider, drink an ORS. Make an ORS by following instructions on the package. Start by drinking small amounts, about  cup  (120 mL) every 5-10 minutes. Slowly increase how much you drink until you have taken the amount recommended by your health care provider. Drink enough clear fluids to keep your urine pale yellow. If you were told to drink an ORS, finish it first, then start slowly drinking other clear fluids. Drink fluids such as: Water. This includes sparkling water and flavored water. Drinking only water can lead to having too little sodium in your body (hyponatremia). Follow the advice of your health care provider. Water from ice chips you suck on. Fruit juice with water you add to it (diluted). Sports drinks. Hot or cold herbal teas. Broth-based soups. Milk or milk products. Food Follow instructions from your health care provider about what to eat while you rehydrate. Your health care provider may recommend that you slowly begin eating regular foods in small amounts. Eat foods that contain a healthy balance of electrolytes, such as bananas, oranges, potatoes, tomatoes, and spinach. Avoid foods that are greasy or contain a lot of sugar. In some cases, you may get nutrition through a feeding tube that is passed through your nose and into your stomach (nasogastric tube, or NG tube). This may be done if you have uncontrolled vomiting or diarrhea. Beverages to avoid  Certain beverages may make dehydration worse. While you rehydrate, avoid drinking alcohol. How to tell if you are recovering from dehydration You may be recovering from dehydration if: You are urinating more often than before you started rehydrating. Your urine is pale yellow. Your energy level improves. You vomit less frequently. You have diarrhea less frequently. Your appetite improves or returns to normal. You feel less dizzy or less light-headed. Your skin tone and color start to look more normal. Follow these instructions at home: Take over-the-counter and prescription medicines only as told by your health care provider. Do not take  sodium tablets. Doing this can lead to having too much sodium in your body (hypernatremia). Contact a health care provider if: You continue to have symptoms of mild or moderate dehydration, such as: Thirst. Dry lips. Slightly dry mouth. Dizziness. Dark urine or less urine than normal. Muscle cramps. You continue to vomit or have diarrhea. Get help right away if you: Have symptoms of dehydration that get worse. Have a fever. Have a severe headache. Have been vomiting and the following happens: Your vomiting gets worse or does not go away. Your vomit includes blood or green matter (bile). You cannot eat or drink without vomiting. Have problems with urination or bowel movements, such as: Diarrhea that gets worse or does not go away. Blood in your stool (  feces). This may cause stool to look black and tarry. Not urinating, or urinating only a small amount of very dark urine, within 6-8 hours. Have trouble breathing. Have symptoms that get worse with treatment. These symptoms may represent a serious problem that is an emergency. Do not wait to see if the symptoms will go away. Get medical help right away. Call your local emergency services (911 in the [place] Do not drive yourself to the hospital. Summary Rehydration is the replacement of body fluids and minerals (electrolytes) that are lost during dehydration. Follow instructions from your health care provider for rehydration. The kind of fluid and amount you should drink depend on your condition. Slowly increase how much you drink until you have taken the amount recommended by your health care provider. Contact your health care provider if you continue to show signs of mild or moderate dehydration. This information is not intended to replace advice given to you by your health care provider. Make sure you discuss any questions you have with your health care provider. Document Revised: 01/04/2020 Document Reviewed: 11/14/2019 Elsevier  Patient Education  [place].  Pegfilgrastim Injection What is this medication? PEGFILGRASTIM (PEG fil gra stim) lowers the risk of infection in people who are receiving chemotherapy. It works by Building control surveyor make more white blood cells, which protects your body from infection. It may also be used to help people who have been exposed to high doses of radiation. This medicine may be used for other purposes; ask your health care provider or pharmacist if you have questions. COMMON BRAND NAME(S): [place], Neulasta, Nyvepria, Stimufend, UDENYCA, Ziextenzo What should I tell my care team before I take this medication? They need to know if you have any of these conditions: Kidney disease Latex allergy Ongoing radiation therapy Sickle cell disease Skin reactions to acrylic adhesives (On-Body Injector only) An unusual or allergic reaction to pegfilgrastim, filgrastim, other medications, foods, dyes, or preservatives Pregnant or trying to get pregnant Breast-feeding How should I use this medication? This medication is for injection under the skin. If you get this medication at home, you will be taught how to prepare and give the pre-filled syringe or how to use the On-body Injector. Refer to the patient Instructions for Use for detailed instructions. Use exactly as directed. Tell your care team immediately if you suspect that the On-body Injector may not have performed as intended or if you suspect the use of the On-body Injector resulted in a missed or partial dose. It is important that you put your used needles and syringes in a special sharps container. Do not put them in a trash can. If you do not have a sharps container, call your pharmacist or care team to get one. Talk to your care team about the use of this medication in children. While this medication may be prescribed for selected conditions, precautions do apply. Overdosage: If you think you have taken too much of this  medicine contact a poison control center or emergency room at once. NOTE: This medicine is only for you. Do not share this medicine with others. What if I miss a dose? It is important not to miss your dose. Call your care team if you miss your dose. If you miss a dose due to an On-body Injector failure or leakage, a new dose should be administered as soon as possible using a single prefilled syringe for manual use. What may interact with this medication? Interactions have not been studied. This list  may not describe all possible interactions. Give your health care provider a list of all the medicines, herbs, non-prescription drugs, or dietary supplements you use. Also tell them if you smoke, drink alcohol, or use illegal drugs. Some items may interact with your medicine. What should I watch for while using this medication? Your condition will be monitored carefully while you are receiving this medication. You may need blood work done while you are taking this medication. Talk to your care team about your risk of cancer. You may be more at risk for certain types of cancer if you take this medication. If you are going to need a MRI, CT scan, or other procedure, tell your care team that you are using this medication (On-Body Injector only). What side effects may I notice from receiving this medication? Side effects that you should report to your care team as soon as possible: Allergic reactions--skin rash, itching, hives, swelling of the face, lips, tongue, or throat Capillary leak syndrome--stomach or muscle pain, unusual weakness or fatigue, feeling faint or lightheaded, decrease in the amount of urine, swelling of the ankles, hands, or feet, trouble breathing High white blood cell level--fever, fatigue, trouble breathing, night sweats, change in vision, weight loss Inflammation of the aorta--fever, fatigue, back, chest, or stomach pain, severe headache Kidney injury (glomerulonephritis)--decrease  in the amount of urine, red or dark brown urine, foamy or bubbly urine, swelling of the ankles, hands, or feet Shortness of breath or trouble breathing Spleen injury--pain in upper left stomach or shoulder Unusual bruising or bleeding Side effects that usually do not require medical attention (report to your care team if they continue or are bothersome): Bone pain Pain in the hands or feet This list may not describe all possible side effects. Call your doctor for medical advice about side effects. You may report side effects to FDA at 1-800-FDA-1088. Where should I keep my medication? Keep out of the reach of children. If you are using this medication at home, you will be instructed on how to store it. Throw away any unused medication after the expiration date on the label. NOTE: This sheet is a summary. It may not cover all possible information. If you have questions about this medicine, talk to your doctor, pharmacist, or health care provider.  2023 Elsevier/Gold Standard (2021-10-04 00:00:00)

## 2022-05-09 ENCOUNTER — Inpatient Hospital Stay: Payer: BC Managed Care – PPO

## 2022-05-09 ENCOUNTER — Other Ambulatory Visit: Payer: Self-pay | Admitting: *Deleted

## 2022-05-09 VITALS — BP 109/79 | HR 66 | Temp 97.9°F | Resp 18

## 2022-05-09 DIAGNOSIS — C20 Malignant neoplasm of rectum: Secondary | ICD-10-CM | POA: Diagnosis not present

## 2022-05-09 DIAGNOSIS — Z923 Personal history of irradiation: Secondary | ICD-10-CM | POA: Diagnosis not present

## 2022-05-09 DIAGNOSIS — I10 Essential (primary) hypertension: Secondary | ICD-10-CM | POA: Diagnosis not present

## 2022-05-09 DIAGNOSIS — M109 Gout, unspecified: Secondary | ICD-10-CM | POA: Diagnosis not present

## 2022-05-09 DIAGNOSIS — L409 Psoriasis, unspecified: Secondary | ICD-10-CM | POA: Diagnosis not present

## 2022-05-09 DIAGNOSIS — Z95828 Presence of other vascular implants and grafts: Secondary | ICD-10-CM

## 2022-05-09 DIAGNOSIS — D61810 Antineoplastic chemotherapy induced pancytopenia: Secondary | ICD-10-CM | POA: Diagnosis not present

## 2022-05-09 DIAGNOSIS — T451X5D Adverse effect of antineoplastic and immunosuppressive drugs, subsequent encounter: Secondary | ICD-10-CM | POA: Diagnosis not present

## 2022-05-09 DIAGNOSIS — Z5189 Encounter for other specified aftercare: Secondary | ICD-10-CM | POA: Diagnosis not present

## 2022-05-09 DIAGNOSIS — Z5111 Encounter for antineoplastic chemotherapy: Secondary | ICD-10-CM | POA: Diagnosis not present

## 2022-05-09 MED ORDER — SODIUM CHLORIDE 0.9 % IV SOLN: INTRAVENOUS | Status: DC

## 2022-05-09 MED ORDER — SODIUM CHLORIDE 0.9 % IV SOLN: INTRAVENOUS | Status: AC

## 2022-05-09 MED ORDER — HEPARIN SOD (PORK) LOCK FLUSH 100 UNIT/ML IV SOLN
500.0000 [IU] | Freq: Once | INTRAVENOUS | Status: AC
  Administered 2022-05-09: 500 [IU] via INTRAVENOUS

## 2022-05-09 MED ORDER — SODIUM CHLORIDE 0.9% FLUSH
10.0000 mL | Freq: Once | INTRAVENOUS | Status: AC
  Administered 2022-05-09: 10 mL via INTRAVENOUS

## 2022-05-12 ENCOUNTER — Other Ambulatory Visit: Payer: Self-pay | Admitting: *Deleted

## 2022-05-12 ENCOUNTER — Other Ambulatory Visit (HOSPITAL_BASED_OUTPATIENT_CLINIC_OR_DEPARTMENT_OTHER): Payer: Self-pay

## 2022-05-12 ENCOUNTER — Inpatient Hospital Stay: Payer: BC Managed Care – PPO

## 2022-05-12 VITALS — BP 109/83 | HR 67 | Temp 98.3°F | Resp 20 | Ht 71.0 in | Wt 184.4 lb

## 2022-05-12 DIAGNOSIS — C20 Malignant neoplasm of rectum: Secondary | ICD-10-CM

## 2022-05-12 DIAGNOSIS — Z923 Personal history of irradiation: Secondary | ICD-10-CM | POA: Diagnosis not present

## 2022-05-12 DIAGNOSIS — M109 Gout, unspecified: Secondary | ICD-10-CM | POA: Diagnosis not present

## 2022-05-12 DIAGNOSIS — D61810 Antineoplastic chemotherapy induced pancytopenia: Secondary | ICD-10-CM | POA: Diagnosis not present

## 2022-05-12 DIAGNOSIS — T451X5D Adverse effect of antineoplastic and immunosuppressive drugs, subsequent encounter: Secondary | ICD-10-CM | POA: Diagnosis not present

## 2022-05-12 DIAGNOSIS — I10 Essential (primary) hypertension: Secondary | ICD-10-CM | POA: Diagnosis not present

## 2022-05-12 DIAGNOSIS — Z95828 Presence of other vascular implants and grafts: Secondary | ICD-10-CM

## 2022-05-12 DIAGNOSIS — L409 Psoriasis, unspecified: Secondary | ICD-10-CM | POA: Diagnosis not present

## 2022-05-12 DIAGNOSIS — Z5189 Encounter for other specified aftercare: Secondary | ICD-10-CM | POA: Diagnosis not present

## 2022-05-12 DIAGNOSIS — Z5111 Encounter for antineoplastic chemotherapy: Secondary | ICD-10-CM | POA: Diagnosis not present

## 2022-05-12 MED ORDER — SODIUM CHLORIDE 0.9 % IV SOLN: INTRAVENOUS | Status: AC

## 2022-05-12 MED ORDER — HEPARIN SOD (PORK) LOCK FLUSH 100 UNIT/ML IV SOLN
500.0000 [IU] | Freq: Once | INTRAVENOUS | Status: AC
  Administered 2022-05-12: 500 [IU] via INTRAVENOUS

## 2022-05-12 MED ORDER — SODIUM CHLORIDE 0.9% FLUSH
10.0000 mL | Freq: Once | INTRAVENOUS | Status: AC
  Administered 2022-05-12: 10 mL via INTRAVENOUS

## 2022-05-12 MED ORDER — DIPHENOXYLATE-ATROPINE 2.5-0.025 MG PO TABS
ORAL_TABLET | ORAL | 0 refills | Status: DC
  Filled 2022-05-12: qty 15, 2d supply, fill #0
  Filled 2022-05-12: qty 60, 7d supply, fill #0

## 2022-05-12 NOTE — Telephone Encounter (Signed)
Patient here for IVF today and requesting refill on Lomotil.

## 2022-05-14 ENCOUNTER — Inpatient Hospital Stay: Payer: BC Managed Care – PPO

## 2022-05-14 VITALS — BP 111/81 | HR 63 | Temp 98.4°F | Resp 20

## 2022-05-14 DIAGNOSIS — T451X5D Adverse effect of antineoplastic and immunosuppressive drugs, subsequent encounter: Secondary | ICD-10-CM | POA: Diagnosis not present

## 2022-05-14 DIAGNOSIS — L409 Psoriasis, unspecified: Secondary | ICD-10-CM | POA: Diagnosis not present

## 2022-05-14 DIAGNOSIS — Z95828 Presence of other vascular implants and grafts: Secondary | ICD-10-CM

## 2022-05-14 DIAGNOSIS — Z5111 Encounter for antineoplastic chemotherapy: Secondary | ICD-10-CM | POA: Diagnosis not present

## 2022-05-14 DIAGNOSIS — Z923 Personal history of irradiation: Secondary | ICD-10-CM | POA: Diagnosis not present

## 2022-05-14 DIAGNOSIS — D61810 Antineoplastic chemotherapy induced pancytopenia: Secondary | ICD-10-CM | POA: Diagnosis not present

## 2022-05-14 DIAGNOSIS — M109 Gout, unspecified: Secondary | ICD-10-CM | POA: Diagnosis not present

## 2022-05-14 DIAGNOSIS — C20 Malignant neoplasm of rectum: Secondary | ICD-10-CM | POA: Diagnosis not present

## 2022-05-14 DIAGNOSIS — I10 Essential (primary) hypertension: Secondary | ICD-10-CM | POA: Diagnosis not present

## 2022-05-14 DIAGNOSIS — Z5189 Encounter for other specified aftercare: Secondary | ICD-10-CM | POA: Diagnosis not present

## 2022-05-14 MED ORDER — SODIUM CHLORIDE 0.9% FLUSH
10.0000 mL | Freq: Once | INTRAVENOUS | Status: AC
  Administered 2022-05-14: 10 mL via INTRAVENOUS

## 2022-05-14 MED ORDER — SODIUM CHLORIDE 0.9 % IV SOLN: INTRAVENOUS | Status: AC

## 2022-05-14 MED ORDER — HEPARIN SOD (PORK) LOCK FLUSH 100 UNIT/ML IV SOLN
500.0000 [IU] | Freq: Once | INTRAVENOUS | Status: AC
  Administered 2022-05-14: 500 [IU] via INTRAVENOUS

## 2022-05-14 NOTE — Patient Instructions (Signed)
Rehydration, Adult Rehydration is the replacement of body fluids, salts, and minerals (electrolytes) that are lost during dehydration. Dehydration is when there is not enough water or other fluids in the body. This happens when you lose more fluids than you take in. Common causes of dehydration include: Not drinking enough fluids. This can occur when you are ill or doing activities that require a lot of energy, especially in hot weather. Conditions that cause loss of water or other fluids, such as diarrhea, vomiting, sweating, or urinating a lot. Other illnesses, such as fever or infection. Certain medicines, such as those that remove excess fluid from the body (diuretics). Symptoms of mild or moderate dehydration may include thirst, dry lips and mouth, and dizziness. Symptoms of severe dehydration may include increased heart rate, confusion, fainting, and not urinating. For severe dehydration, you may need to get fluids through an IV at the hospital. For mild or moderate dehydration, you can usually rehydrate at home by drinking certain fluids as told by your health care provider. What are the risks? Generally, rehydration is safe. However, taking in too much fluid (overhydration) can be a problem. This is rare. Overhydration can cause an electrolyte imbalance, kidney failure, or a decrease in salt (sodium) levels in the body. Supplies needed You will need an oral rehydration solution (ORS) if your health care provider tells you to use one. This is a drink to treat dehydration. It can be found in pharmacies and retail stores. How to rehydrate Fluids Follow instructions from your health care provider for rehydration. The kind of fluid and the amount you should drink depend on your condition. In general, you should choose drinks that you prefer. If told by your health care provider, drink an ORS. Make an ORS by following instructions on the package. Start by drinking small amounts, about  cup (120  mL) every 5-10 minutes. Slowly increase how much you drink until you have taken the amount recommended by your health care provider. Drink enough clear fluids to keep your urine pale yellow. If you were told to drink an ORS, finish it first, then start slowly drinking other clear fluids. Drink fluids such as: Water. This includes sparkling water and flavored water. Drinking only water can lead to having too little sodium in your body (hyponatremia). Follow the advice of your health care provider. Water from ice chips you suck on. Fruit juice with water you add to it (diluted). Sports drinks. Hot or cold herbal teas. Broth-based soups. Milk or milk products. Food Follow instructions from your health care provider about what to eat while you rehydrate. Your health care provider may recommend that you slowly begin eating regular foods in small amounts. Eat foods that contain a healthy balance of electrolytes, such as bananas, oranges, potatoes, tomatoes, and spinach. Avoid foods that are greasy or contain a lot of sugar. In some cases, you may get nutrition through a feeding tube that is passed through your nose and into your stomach (nasogastric tube, or NG tube). This may be done if you have uncontrolled vomiting or diarrhea. Beverages to avoid  Certain beverages may make dehydration worse. While you rehydrate, avoid drinking alcohol. How to tell if you are recovering from dehydration You may be recovering from dehydration if: You are urinating more often than before you started rehydrating. Your urine is pale yellow. Your energy level improves. You vomit less frequently. You have diarrhea less frequently. Your appetite improves or returns to normal. You feel less dizzy or less light-headed.   Your skin tone and color start to look more normal. Follow these instructions at home: Take over-the-counter and prescription medicines only as told by your health care provider. Do not take sodium  tablets. Doing this can lead to having too much sodium in your body (hypernatremia). Contact a health care provider if: You continue to have symptoms of mild or moderate dehydration, such as: Thirst. Dry lips. Slightly dry mouth. Dizziness. Dark urine or less urine than normal. Muscle cramps. You continue to vomit or have diarrhea. Get help right away if you: Have symptoms of dehydration that get worse. Have a fever. Have a severe headache. Have been vomiting and the following happens: Your vomiting gets worse or does not go away. Your vomit includes blood or green matter (bile). You cannot eat or drink without vomiting. Have problems with urination or bowel movements, such as: Diarrhea that gets worse or does not go away. Blood in your stool (feces). This may cause stool to look black and tarry. Not urinating, or urinating only a small amount of very dark urine, within 6-8 hours. Have trouble breathing. Have symptoms that get worse with treatment. These symptoms may represent a serious problem that is an emergency. Do not wait to see if the symptoms will go away. Get medical help right away. Call your local emergency services (911 in the [place] Do not drive yourself to the hospital. Summary Rehydration is the replacement of body fluids and minerals (electrolytes) that are lost during dehydration. Follow instructions from your health care provider for rehydration. The kind of fluid and amount you should drink depend on your condition. Slowly increase how much you drink until you have taken the amount recommended by your health care provider. Contact your health care provider if you continue to show signs of mild or moderate dehydration. This information is not intended to replace advice given to you by your health care provider. Make sure you discuss any questions you have with your health care provider. Document Revised: 01/04/2020 Document Reviewed: 11/14/2019 Elsevier Patient  Education  2023 [employer]  

## 2022-05-16 ENCOUNTER — Other Ambulatory Visit: Payer: Self-pay | Admitting: *Deleted

## 2022-05-16 ENCOUNTER — Inpatient Hospital Stay: Payer: BC Managed Care – PPO

## 2022-05-16 VITALS — BP 118/80 | HR 65 | Temp 98.2°F | Resp 18

## 2022-05-16 DIAGNOSIS — Z5189 Encounter for other specified aftercare: Secondary | ICD-10-CM | POA: Diagnosis not present

## 2022-05-16 DIAGNOSIS — I10 Essential (primary) hypertension: Secondary | ICD-10-CM | POA: Diagnosis not present

## 2022-05-16 DIAGNOSIS — Z923 Personal history of irradiation: Secondary | ICD-10-CM | POA: Diagnosis not present

## 2022-05-16 DIAGNOSIS — M109 Gout, unspecified: Secondary | ICD-10-CM | POA: Diagnosis not present

## 2022-05-16 DIAGNOSIS — T451X5D Adverse effect of antineoplastic and immunosuppressive drugs, subsequent encounter: Secondary | ICD-10-CM | POA: Diagnosis not present

## 2022-05-16 DIAGNOSIS — D61810 Antineoplastic chemotherapy induced pancytopenia: Secondary | ICD-10-CM | POA: Diagnosis not present

## 2022-05-16 DIAGNOSIS — C20 Malignant neoplasm of rectum: Secondary | ICD-10-CM | POA: Diagnosis not present

## 2022-05-16 DIAGNOSIS — L409 Psoriasis, unspecified: Secondary | ICD-10-CM | POA: Diagnosis not present

## 2022-05-16 DIAGNOSIS — E871 Hypo-osmolality and hyponatremia: Secondary | ICD-10-CM

## 2022-05-16 DIAGNOSIS — Z5111 Encounter for antineoplastic chemotherapy: Secondary | ICD-10-CM | POA: Diagnosis not present

## 2022-05-16 DIAGNOSIS — Z95828 Presence of other vascular implants and grafts: Secondary | ICD-10-CM

## 2022-05-16 MED ORDER — SODIUM CHLORIDE 0.9% FLUSH
10.0000 mL | Freq: Once | INTRAVENOUS | Status: AC
  Administered 2022-05-16: 10 mL via INTRAVENOUS

## 2022-05-16 MED ORDER — HEPARIN SOD (PORK) LOCK FLUSH 100 UNIT/ML IV SOLN
500.0000 [IU] | Freq: Once | INTRAVENOUS | Status: AC
  Administered 2022-05-16: 500 [IU] via INTRAVENOUS

## 2022-05-16 MED ORDER — SODIUM CHLORIDE 0.9 % IV SOLN: INTRAVENOUS | Status: AC

## 2022-05-16 NOTE — Progress Notes (Signed)
Fluid orders placed for week of 7/3.

## 2022-05-16 NOTE — Patient Instructions (Signed)
Rehydration, Adult Rehydration is the replacement of body fluids, salts, and minerals (electrolytes) that are lost during dehydration. Dehydration is when there is not enough water or other fluids in the body. This happens when you lose more fluids than you take in. Common causes of dehydration include: Not drinking enough fluids. This can occur when you are ill or doing activities that require a lot of energy, especially in hot weather. Conditions that cause loss of water or other fluids, such as diarrhea, vomiting, sweating, or urinating a lot. Other illnesses, such as fever or infection. Certain medicines, such as those that remove excess fluid from the body (diuretics). Symptoms of mild or moderate dehydration may include thirst, dry lips and mouth, and dizziness. Symptoms of severe dehydration may include increased heart rate, confusion, fainting, and not urinating. For severe dehydration, you may need to get fluids through an IV at the hospital. For mild or moderate dehydration, you can usually rehydrate at home by drinking certain fluids as told by your health care provider. What are the risks? Generally, rehydration is safe. However, taking in too much fluid (overhydration) can be a problem. This is rare. Overhydration can cause an electrolyte imbalance, kidney failure, or a decrease in salt (sodium) levels in the body. Supplies needed You will need an oral rehydration solution (ORS) if your health care provider tells you to use one. This is a drink to treat dehydration. It can be found in pharmacies and retail stores. How to rehydrate Fluids Follow instructions from your health care provider for rehydration. The kind of fluid and the amount you should drink depend on your condition. In general, you should choose drinks that you prefer. If told by your health care provider, drink an ORS. Make an ORS by following instructions on the package. Start by drinking small amounts, about  cup (120  mL) every 5-10 minutes. Slowly increase how much you drink until you have taken the amount recommended by your health care provider. Drink enough clear fluids to keep your urine pale yellow. If you were told to drink an ORS, finish it first, then start slowly drinking other clear fluids. Drink fluids such as: Water. This includes sparkling water and flavored water. Drinking only water can lead to having too little sodium in your body (hyponatremia). Follow the advice of your health care provider. Water from ice chips you suck on. Fruit juice with water you add to it (diluted). Sports drinks. Hot or cold herbal teas. Broth-based soups. Milk or milk products. Food Follow instructions from your health care provider about what to eat while you rehydrate. Your health care provider may recommend that you slowly begin eating regular foods in small amounts. Eat foods that contain a healthy balance of electrolytes, such as bananas, oranges, potatoes, tomatoes, and spinach. Avoid foods that are greasy or contain a lot of sugar. In some cases, you may get nutrition through a feeding tube that is passed through your nose and into your stomach (nasogastric tube, or NG tube). This may be done if you have uncontrolled vomiting or diarrhea. Beverages to avoid  Certain beverages may make dehydration worse. While you rehydrate, avoid drinking alcohol. How to tell if you are recovering from dehydration You may be recovering from dehydration if: You are urinating more often than before you started rehydrating. Your urine is pale yellow. Your energy level improves. You vomit less frequently. You have diarrhea less frequently. Your appetite improves or returns to normal. You feel less dizzy or less light-headed.   Your skin tone and color start to look more normal. Follow these instructions at home: Take over-the-counter and prescription medicines only as told by your health care provider. Do not take sodium  tablets. Doing this can lead to having too much sodium in your body (hypernatremia). Contact a health care provider if: You continue to have symptoms of mild or moderate dehydration, such as: Thirst. Dry lips. Slightly dry mouth. Dizziness. Dark urine or less urine than normal. Muscle cramps. You continue to vomit or have diarrhea. Get help right away if you: Have symptoms of dehydration that get worse. Have a fever. Have a severe headache. Have been vomiting and the following happens: Your vomiting gets worse or does not go away. Your vomit includes blood or green matter (bile). You cannot eat or drink without vomiting. Have problems with urination or bowel movements, such as: Diarrhea that gets worse or does not go away. Blood in your stool (feces). This may cause stool to look black and tarry. Not urinating, or urinating only a small amount of very dark urine, within 6-8 hours. Have trouble breathing. Have symptoms that get worse with treatment. These symptoms may represent a serious problem that is an emergency. Do not wait to see if the symptoms will go away. Get medical help right away. Call your local emergency services (911 in the [place] Do not drive yourself to the hospital. Summary Rehydration is the replacement of body fluids and minerals (electrolytes) that are lost during dehydration. Follow instructions from your health care provider for rehydration. The kind of fluid and amount you should drink depend on your condition. Slowly increase how much you drink until you have taken the amount recommended by your health care provider. Contact your health care provider if you continue to show signs of mild or moderate dehydration. This information is not intended to replace advice given to you by your health care provider. Make sure you discuss any questions you have with your health care provider. Document Revised: 01/04/2020 Document Reviewed: 11/14/2019 Elsevier Patient  Education  2023 [employer]  

## 2022-05-17 ENCOUNTER — Other Ambulatory Visit: Payer: Self-pay | Admitting: Oncology

## 2022-05-19 ENCOUNTER — Inpatient Hospital Stay: Payer: BC Managed Care – PPO | Admitting: Oncology

## 2022-05-19 ENCOUNTER — Inpatient Hospital Stay: Payer: BC Managed Care – PPO

## 2022-05-19 ENCOUNTER — Other Ambulatory Visit: Payer: Self-pay

## 2022-05-19 ENCOUNTER — Encounter: Payer: Self-pay | Admitting: *Deleted

## 2022-05-19 ENCOUNTER — Telehealth: Payer: Self-pay

## 2022-05-19 ENCOUNTER — Other Ambulatory Visit (HOSPITAL_BASED_OUTPATIENT_CLINIC_OR_DEPARTMENT_OTHER): Payer: Self-pay

## 2022-05-19 VITALS — BP 110/79 | HR 81 | Temp 98.2°F | Resp 18 | Ht 71.0 in | Wt 182.0 lb

## 2022-05-19 DIAGNOSIS — G620 Drug-induced polyneuropathy: Secondary | ICD-10-CM | POA: Insufficient documentation

## 2022-05-19 DIAGNOSIS — C20 Malignant neoplasm of rectum: Secondary | ICD-10-CM | POA: Insufficient documentation

## 2022-05-19 DIAGNOSIS — I10 Essential (primary) hypertension: Secondary | ICD-10-CM | POA: Insufficient documentation

## 2022-05-19 DIAGNOSIS — Z923 Personal history of irradiation: Secondary | ICD-10-CM | POA: Insufficient documentation

## 2022-05-19 DIAGNOSIS — Z5111 Encounter for antineoplastic chemotherapy: Secondary | ICD-10-CM | POA: Insufficient documentation

## 2022-05-19 DIAGNOSIS — T451X5A Adverse effect of antineoplastic and immunosuppressive drugs, initial encounter: Secondary | ICD-10-CM | POA: Insufficient documentation

## 2022-05-19 DIAGNOSIS — M109 Gout, unspecified: Secondary | ICD-10-CM | POA: Insufficient documentation

## 2022-05-19 DIAGNOSIS — L409 Psoriasis, unspecified: Secondary | ICD-10-CM | POA: Insufficient documentation

## 2022-05-19 DIAGNOSIS — Z5189 Encounter for other specified aftercare: Secondary | ICD-10-CM | POA: Diagnosis not present

## 2022-05-19 DIAGNOSIS — Z95828 Presence of other vascular implants and grafts: Secondary | ICD-10-CM

## 2022-05-19 DIAGNOSIS — R634 Abnormal weight loss: Secondary | ICD-10-CM | POA: Insufficient documentation

## 2022-05-19 DIAGNOSIS — E871 Hypo-osmolality and hyponatremia: Secondary | ICD-10-CM

## 2022-05-19 LAB — CMP (CANCER CENTER ONLY)
ALT: 30 U/L (ref 0–44)
AST: 39 U/L (ref 15–41)
Albumin: 4.4 g/dL (ref 3.5–5.0)
Alkaline Phosphatase: 217 U/L — ABNORMAL HIGH (ref 38–126)
Anion gap: 11 (ref 5–15)
BUN: 16 mg/dL (ref 6–20)
CO2: 32 mmol/L (ref 22–32)
Calcium: 10 mg/dL (ref 8.9–10.3)
Chloride: 93 mmol/L — ABNORMAL LOW (ref 98–111)
Creatinine: 1.76 mg/dL — ABNORMAL HIGH (ref 0.61–1.24)
GFR, Estimated: 44 mL/min — ABNORMAL LOW (ref >60)
Glucose, Bld: 155 mg/dL — ABNORMAL HIGH (ref 70–99)
Potassium: 3.5 mmol/L (ref 3.5–5.1)
Sodium: 136 mmol/L (ref 135–145)
Total Bilirubin: 0.8 mg/dL (ref 0.3–1.2)
Total Protein: 7.4 g/dL (ref 6.5–8.1)

## 2022-05-19 LAB — CBC WITH DIFFERENTIAL (CANCER CENTER ONLY)
Abs Immature Granulocytes: 1.41 10*3/uL — ABNORMAL HIGH (ref 0.00–0.07)
Basophils Absolute: 0.1 10*3/uL (ref 0.0–0.1)
Basophils Relative: 1 %
Eosinophils Absolute: 0.1 10*3/uL (ref 0.0–0.5)
Eosinophils Relative: 1 %
HCT: 37.8 % — ABNORMAL LOW (ref 39.0–52.0)
Hemoglobin: 12.7 g/dL — ABNORMAL LOW (ref 13.0–17.0)
Immature Granulocytes: 11 %
Lymphocytes Relative: 13 %
Lymphs Abs: 1.7 10*3/uL (ref 0.7–4.0)
MCH: 30.6 pg (ref 26.0–34.0)
MCHC: 33.6 g/dL (ref 30.0–36.0)
MCV: 91.1 fL (ref 80.0–100.0)
Monocytes Absolute: 1.2 10*3/uL — ABNORMAL HIGH (ref 0.1–1.0)
Monocytes Relative: 9 %
Neutro Abs: 8.4 10*3/uL — ABNORMAL HIGH (ref 1.7–7.7)
Neutrophils Relative %: 65 %
Platelet Count: 93 10*3/uL — ABNORMAL LOW (ref 150–400)
RBC: 4.15 MIL/uL — ABNORMAL LOW (ref 4.22–5.81)
RDW: 21.6 % — ABNORMAL HIGH (ref 11.5–15.5)
Smear Review: NORMAL
WBC Count: 12.8 10*3/uL — ABNORMAL HIGH (ref 4.0–10.5)
nRBC: 0.2 % (ref 0.0–0.2)

## 2022-05-19 MED ORDER — POTASSIUM CHLORIDE CRYS ER 20 MEQ PO TBCR
20.0000 meq | EXTENDED_RELEASE_TABLET | Freq: Every day | ORAL | 1 refills | Status: DC
  Filled 2022-05-19: qty 30, 30d supply, fill #0
  Filled 2022-06-20: qty 30, 30d supply, fill #1

## 2022-05-19 MED ORDER — DEXTROSE 5 % IV SOLN: Freq: Once | INTRAVENOUS | Status: AC

## 2022-05-19 MED ORDER — PALONOSETRON HCL INJECTION 0.25 MG/5ML
0.2500 mg | Freq: Once | INTRAVENOUS | Status: AC
  Administered 2022-05-19: 0.25 mg via INTRAVENOUS
  Filled 2022-05-19: qty 5

## 2022-05-19 MED ORDER — SODIUM CHLORIDE 0.9% FLUSH
10.0000 mL | INTRAVENOUS | Status: DC | PRN
  Administered 2022-05-19: 10 mL via INTRAVENOUS

## 2022-05-19 MED ORDER — SODIUM CHLORIDE 0.9 % IV SOLN: INTRAVENOUS | Status: DC

## 2022-05-19 MED ORDER — FLUOROURACIL CHEMO INJECTION 2.5 GM/50ML
400.0000 mg/m2 | Freq: Once | INTRAVENOUS | Status: AC
  Administered 2022-05-19: 850 mg via INTRAVENOUS
  Filled 2022-05-19: qty 17

## 2022-05-19 MED ORDER — LEUCOVORIN CALCIUM INJECTION 350 MG
400.0000 mg/m2 | Freq: Once | INTRAVENOUS | Status: AC
  Administered 2022-05-19: 840 mg via INTRAVENOUS
  Filled 2022-05-19: qty 42

## 2022-05-19 MED ORDER — SODIUM CHLORIDE 0.9 % IV SOLN
10.0000 mg | Freq: Once | INTRAVENOUS | Status: AC
  Administered 2022-05-19: 10 mg via INTRAVENOUS
  Filled 2022-05-19: qty 10

## 2022-05-19 MED ORDER — SODIUM CHLORIDE 0.9 % IV SOLN
2390.0000 mg/m2 | INTRAVENOUS | Status: DC
  Administered 2022-05-19: 5000 mg via INTRAVENOUS
  Filled 2022-05-19: qty 100

## 2022-05-19 MED ORDER — OXALIPLATIN CHEMO INJECTION 100 MG/20ML
65.0000 mg/m2 | Freq: Once | INTRAVENOUS | Status: AC
  Administered 2022-05-19: 135 mg via INTRAVENOUS
  Filled 2022-05-19: qty 20

## 2022-05-19 NOTE — Progress Notes (Signed)
Patient seen by Dr. [name] today  Vitals are within treatment parameters.  Labs reviewed by Dr. [name] and are not all within treatment parameters. Platelet = 93,000 and serum creatinine 1.76 OK to treat with above lab results. Per physician team, patient is ready for treatment and there are NO modifications to the treatment plan.  Will receive 1 liter NS today and W/F this week per usual schedule--

## 2022-05-19 NOTE — Patient Instructions (Signed)

## 2022-05-19 NOTE — Progress Notes (Signed)
[facility] OFFICE PROGRESS NOTE   Diagnosis: Rectal cancer  INTERVAL HISTORY:   Kenneth [name] returns as scheduled.  Completed another cycle of FOLFOX on 05/05/2022.  He continues to have liquid output from the ileostomy.  The output has been thicker since he increase the Imodium and Lomotil to 8 tablets each per day.  His mouth is dry.  He feels better after receiving IV fluids.  He has cold sensitivity lasting 7 to 8 days following chemotherapy.  He has numbness and tingling into fingers on the left hand, this predated chemotherapy.  No other numbness.  Objective:  Vital signs in last 24 hours:  Blood pressure 110/79, pulse 81, temperature 98.2 F (36.8 C), temperature source Oral, resp. rate 18, height $RemoveBe'5\' 11"'aUwBGRbSO$  (1.803 m), weight 182 lb (82.6 kg), SpO2 99 %.    HEENT: No thrush or ulcers Resp: Lungs clear bilaterally Cardio: Regular rate and rhythm GI: No hepatosplenomegaly, right abdomen ileostomy with a small amount of liquid stool Vascular: No leg edema, diminished skin turgor Neuro: Mild loss of vibratory sense at the fingertips bilaterally   Portacath/PICC-without erythema  Lab Results:  Lab Results  Component Value Date   WBC 12.8 (H) 05/19/2022   HGB 12.7 (L) 05/19/2022   HCT 37.8 (L) 05/19/2022   MCV 91.1 05/19/2022   PLT 93 (L) 05/19/2022   NEUTROABS 8.4 (H) 05/19/2022    CMP  Lab Results  Component Value Date   NA 136 05/19/2022   K 3.5 05/19/2022   CL 93 (L) 05/19/2022   CO2 32 05/19/2022   GLUCOSE 155 (H) 05/19/2022   BUN 16 05/19/2022   CREATININE 1.76 (H) 05/19/2022   CALCIUM 10.0 05/19/2022   PROT 7.4 05/19/2022   ALBUMIN 4.4 05/19/2022   AST 39 05/19/2022   ALT 30 05/19/2022   ALKPHOS 217 (H) 05/19/2022   BILITOT 0.8 05/19/2022   GFRNONAA 44 (L) 05/19/2022   GFRAA 84 03/17/2008    Lab Results  Component Value Date   CEA 5.43 (H) 02/10/2022    Medications: I have reviewed the patient's current  medications.   Assessment/Plan: Rectal cancer-mass at 11 cm on proctoscopy by Dr. Morton Stall 02/04/2022 Colonoscopy 12/26/2021-obstructing mass at the rectosigmoid measured at 15 cm, biopsy invasive moderate to poorly differentiated adenocarcinoma, mismatch repair protein expression intact CTs 12/31/2021-masslike circumferential thickening of the distal sigmoid colon with mildly enlarged pericolonic lymph nodes, numerous subcentimeter hypodense liver lesions, splenomegaly MRI abdomen 01/07/2022-innumerable T2 hyperintense liver lesions consistent with cysts, spleen unremarkable, no ascites or adenopathy, moderate colonic fecal retention, no bowel obstruction MRI pelvis 01/22/2022-T3cN2 tumor above and below the peritoneal reflection, tumor 11.3 cm from the anal verge and 6.1 cm from the sphincter complex, multiple enlarged perirectal nodes, 1.1 cm node versus tumor deposit at the right aspect of the tumor Diverting loop ileostomy 01/15/2022 Cycle 1 FOLFOX 02/17/2022 Cycle 2 FOLFOX 03/04/2022 Cycle 3 FOLFOX held on 03/17/2022 due to neutropenia Cycle 3 FOLFOX 03/17/2022, Udenyca Treatment held 03/31/2022 due to dehydration Cycle 4 FOLFOX 04/07/2022 Cycle 5 FOLFOX 04/22/2022, oxaliplatin dose reduced due to thrombocytopenia Cycle 6 FOLFOX 05/05/2022 Cycle 7 FOLFOX 05/19/2022 Gout Psoriasis Hypertension Left scalene node versus cyst/lipoma on exam 02/03/2022-CT neck 02/10/2022 with 9.5 mm lymph node in the left supraclavicular region posterior to the sternocleidomastoid muscle.  No other prominent lymph nodes in the neck.  Biopsy 02/12/2022 compatible with benign lymph node, negative for metastatic carcinoma.    Disposition: Kenneth [name] has completed 6 cycles of FOLFOX.  He has  tolerated the chemotherapy well.  He continues to have high output from the ileostomy.  He receives IV fluids 3 days/week at the Cancer center.  This will continue.  He will complete cycle 7 FOLFOX today.  He will return for an office visit and the  final cycle of FOLFOX in 2 weeks.  He has mild oxaliplatin neuropathy symptoms.  Betsy Coder, MD  05/19/2022  10:06 AM

## 2022-05-19 NOTE — Patient Instructions (Signed)
[facility]  Discharge Instructions: Thank you for choosing [facility] to provide your oncology and hematology care.   If you have a lab appointment with the [facility], please go directly to the [facility] and check in at the registration area.   Wear comfortable clothing and clothing appropriate for easy access to any Portacath or PICC line.   We strive to give you quality time with your provider. You may need to reschedule your appointment if you arrive late (15 or more minutes).  Arriving late affects you and other patients whose appointments are after yours.  Also, if you miss three or more appointments without notifying the office, you may be dismissed from the clinic at the provider's discretion.      For prescription refill requests, have your pharmacy contact our office and allow 72 hours for refills to be completed.    Today you received the following chemotherapy and/or immunotherapy agents Oxaliplatin, Leucovorin and Adrucil      To help prevent nausea and vomiting after your treatment, we encourage you to take your nausea medication as directed.  BELOW ARE SYMPTOMS THAT SHOULD BE REPORTED IMMEDIATELY: *FEVER GREATER THAN 100.4 F (38 C) OR HIGHER *CHILLS OR SWEATING *NAUSEA AND VOMITING THAT IS NOT CONTROLLED WITH YOUR NAUSEA MEDICATION *UNUSUAL SHORTNESS OF BREATH *UNUSUAL BRUISING OR BLEEDING *URINARY PROBLEMS (pain or burning when urinating, or frequent urination) *BOWEL PROBLEMS (unusual diarrhea, constipation, pain near the anus) TENDERNESS IN MOUTH AND THROAT WITH OR WITHOUT PRESENCE OF ULCERS (sore throat, sores in mouth, or a toothache) UNUSUAL RASH, SWELLING OR PAIN  UNUSUAL VAGINAL DISCHARGE OR ITCHING   Items with * indicate a potential emergency and should be followed up as soon as possible or go to the Emergency Department if any problems should occur.  Please show the CHEMOTHERAPY ALERT CARD or IMMUNOTHERAPY  ALERT CARD at check-in to the Emergency Department and triage nurse.  Should you have questions after your visit or need to cancel or reschedule your appointment, please contact [facility]  Dept: [phone]  and follow the prompts.  Office hours are 8:00 a.m. to 4:30 p.m. Monday - Friday. Please note that voicemails left after 4:00 p.m. may not be returned until the following business day.  We are closed weekends and major holidays. You have access to a nurse at all times for urgent questions. Please call the main number to the clinic Dept: [phone] and follow the prompts.   For any non-urgent questions, you may also contact your provider using MyChart. We now offer e-Visits for anyone 59 and older to request care online for non-urgent symptoms. For details visit [email or web address].   Also download the MyChart app! Go to the app store, search "MyChart", open the app, select [facility], and log in with your MyChart username and password.  Masks are optional in the cancer centers. If you would like for your care team to wear a mask while they are taking care of you, please let them know. For doctor visits, patients may have with them one support person who is at least 61 years old. At this time, visitors are not allowed in the infusion area.

## 2022-05-21 ENCOUNTER — Inpatient Hospital Stay: Payer: BC Managed Care – PPO

## 2022-05-21 VITALS — BP 100/66 | HR 64 | Temp 97.9°F | Resp 18

## 2022-05-21 DIAGNOSIS — Z923 Personal history of irradiation: Secondary | ICD-10-CM | POA: Diagnosis not present

## 2022-05-21 DIAGNOSIS — G620 Drug-induced polyneuropathy: Secondary | ICD-10-CM | POA: Diagnosis not present

## 2022-05-21 DIAGNOSIS — L409 Psoriasis, unspecified: Secondary | ICD-10-CM | POA: Diagnosis not present

## 2022-05-21 DIAGNOSIS — C20 Malignant neoplasm of rectum: Secondary | ICD-10-CM

## 2022-05-21 DIAGNOSIS — T451X5A Adverse effect of antineoplastic and immunosuppressive drugs, initial encounter: Secondary | ICD-10-CM | POA: Diagnosis not present

## 2022-05-21 DIAGNOSIS — I10 Essential (primary) hypertension: Secondary | ICD-10-CM | POA: Diagnosis not present

## 2022-05-21 DIAGNOSIS — R634 Abnormal weight loss: Secondary | ICD-10-CM | POA: Diagnosis not present

## 2022-05-21 DIAGNOSIS — M109 Gout, unspecified: Secondary | ICD-10-CM | POA: Diagnosis not present

## 2022-05-21 DIAGNOSIS — Z5189 Encounter for other specified aftercare: Secondary | ICD-10-CM | POA: Diagnosis not present

## 2022-05-21 DIAGNOSIS — Z5111 Encounter for antineoplastic chemotherapy: Secondary | ICD-10-CM | POA: Diagnosis not present

## 2022-05-21 MED ORDER — HEPARIN SOD (PORK) LOCK FLUSH 100 UNIT/ML IV SOLN
500.0000 [IU] | Freq: Once | INTRAVENOUS | Status: AC | PRN
  Administered 2022-05-21: 500 [IU]

## 2022-05-21 MED ORDER — SODIUM CHLORIDE 0.9% FLUSH
10.0000 mL | INTRAVENOUS | Status: DC | PRN
  Administered 2022-05-21: 10 mL

## 2022-05-21 MED ORDER — SODIUM CHLORIDE 0.9 % IV SOLN: INTRAVENOUS | Status: AC

## 2022-05-21 MED ORDER — PEGFILGRASTIM-CBQV 6 MG/0.6ML ~~LOC~~ SOSY
6.0000 mg | PREFILLED_SYRINGE | Freq: Once | SUBCUTANEOUS | Status: AC
  Administered 2022-05-21: 6 mg via SUBCUTANEOUS
  Filled 2022-05-21: qty 0.6

## 2022-05-21 NOTE — Patient Instructions (Signed)
Pegfilgrastim Injection What is this medication? PEGFILGRASTIM (PEG fil gra stim) lowers the risk of infection in people who are receiving chemotherapy. It works by helping your body make more white blood cells, which protects your body from infection. It may also be used to help people who have been exposed to high doses of radiation. This medicine may be used for other purposes; ask your health care provider or pharmacist if you have questions. COMMON BRAND NAME(S): Fulphila, Fylnetra, Neulasta, Nyvepria, Stimufend, UDENYCA, Ziextenzo What should I tell my care team before I take this medication? They need to know if you have any of these conditions: Kidney disease Latex allergy Ongoing radiation therapy Sickle cell disease Skin reactions to acrylic adhesives (On-Body Injector only) An unusual or allergic reaction to pegfilgrastim, filgrastim, other medications, foods, dyes, or preservatives Pregnant or trying to get pregnant Breast-feeding How should I use this medication? This medication is for injection under the skin. If you get this medication at home, you will be taught how to prepare and give the pre-filled syringe or how to use the On-body Injector. Refer to the patient Instructions for Use for detailed instructions. Use exactly as directed. Tell your care team immediately if you suspect that the On-body Injector may not have performed as intended or if you suspect the use of the On-body Injector resulted in a missed or partial dose. It is important that you put your used needles and syringes in a special sharps container. Do not put them in a trash can. If you do not have a sharps container, call your pharmacist or care team to get one. Talk to your care team about the use of this medication in children. While this medication may be prescribed for selected conditions, precautions do apply. Overdosage: If you think you have taken too much of this medicine contact a poison control center  or emergency room at once. NOTE: This medicine is only for you. Do not share this medicine with others. What if I miss a dose? It is important not to miss your dose. Call your care team if you miss your dose. If you miss a dose due to an On-body Injector failure or leakage, a new dose should be administered as soon as possible using a single prefilled syringe for manual use. What may interact with this medication? Interactions have not been studied. This list may not describe all possible interactions. Give your health care provider a list of all the medicines, herbs, non-prescription drugs, or dietary supplements you use. Also tell them if you smoke, drink alcohol, or use illegal drugs. Some items may interact with your medicine. What should I watch for while using this medication? Your condition will be monitored carefully while you are receiving this medication. You may need blood work done while you are taking this medication. Talk to your care team about your risk of cancer. You may be more at risk for certain types of cancer if you take this medication. If you are going to need a MRI, CT scan, or other procedure, tell your care team that you are using this medication (On-Body Injector only). What side effects may I notice from receiving this medication? Side effects that you should report to your care team as soon as possible: Allergic reactions--skin rash, itching, hives, swelling of the face, lips, tongue, or throat Capillary leak syndrome--stomach or muscle pain, unusual weakness or fatigue, feeling faint or lightheaded, decrease in the amount of urine, swelling of the ankles, hands, or feet, trouble   breathing High white blood cell level--fever, fatigue, trouble breathing, night sweats, change in vision, weight loss Inflammation of the aorta--fever, fatigue, back, chest, or stomach pain, severe headache Kidney injury (glomerulonephritis)--decrease in the amount of urine, red or dark brown  urine, foamy or bubbly urine, swelling of the ankles, hands, or feet Shortness of breath or trouble breathing Spleen injury--pain in upper left stomach or shoulder Unusual bruising or bleeding Side effects that usually do not require medical attention (report to your care team if they continue or are bothersome): Bone pain Pain in the hands or feet This list may not describe all possible side effects. Call your doctor for medical advice about side effects. You may report side effects to FDA at 1-800-FDA-1088. Where should I keep my medication? Keep out of the reach of children. If you are using this medication at home, you will be instructed on how to store it. Throw away any unused medication after the expiration date on the label. NOTE: This sheet is a summary. It may not cover all possible information. If you have questions about this medicine, talk to your doctor, pharmacist, or health care provider.  2023 Elsevier/Gold Standard (2021-10-04 00:00:00)  Rehydration, Adult Rehydration is the replacement of body fluids, salts, and minerals (electrolytes) that are lost during dehydration. Dehydration is when there is not enough water or other fluids in the body. This happens when you lose more fluids than you take in. Common causes of dehydration include: Not drinking enough fluids. This can occur when you are ill or doing activities that require a lot of energy, especially in hot weather. Conditions that cause loss of water or other fluids, such as diarrhea, vomiting, sweating, or urinating a lot. Other illnesses, such as fever or infection. Certain medicines, such as those that remove excess fluid from the body (diuretics). Symptoms of mild or moderate dehydration may include thirst, dry lips and mouth, and dizziness. Symptoms of severe dehydration may include increased heart rate, confusion, fainting, and not urinating. For severe dehydration, you may need to get fluids through an IV at the  hospital. For mild or moderate dehydration, you can usually rehydrate at home by drinking certain fluids as told by your health care provider. What are the risks? Generally, rehydration is safe. However, taking in too much fluid (overhydration) can be a problem. This is rare. Overhydration can cause an electrolyte imbalance, kidney failure, or a decrease in salt (sodium) levels in the body. Supplies needed You will need an oral rehydration solution (ORS) if your health care provider tells you to use one. This is a drink to treat dehydration. It can be found in pharmacies and retail stores. How to rehydrate Fluids Follow instructions from your health care provider for rehydration. The kind of fluid and the amount you should drink depend on your condition. In general, you should choose drinks that you prefer. If told by your health care provider, drink an ORS. Make an ORS by following instructions on the package. Start by drinking small amounts, about  cup (120 mL) every 5-10 minutes. Slowly increase how much you drink until you have taken the amount recommended by your health care provider. Drink enough clear fluids to keep your urine pale yellow. If you were told to drink an ORS, finish it first, then start slowly drinking other clear fluids. Drink fluids such as: Water. This includes sparkling water and flavored water. Drinking only water can lead to having too little sodium in your body (hyponatremia). Follow the advice  of your health care provider. Water from ice chips you suck on. Fruit juice with water you add to it (diluted). Sports drinks. Hot or cold herbal teas. Broth-based soups. Milk or milk products. Food Follow instructions from your health care provider about what to eat while you rehydrate. Your health care provider may recommend that you slowly begin eating regular foods in small amounts. Eat foods that contain a healthy balance of electrolytes, such as bananas, oranges,  potatoes, tomatoes, and spinach. Avoid foods that are greasy or contain a lot of sugar. In some cases, you may get nutrition through a feeding tube that is passed through your nose and into your stomach (nasogastric tube, or NG tube). This may be done if you have uncontrolled vomiting or diarrhea. Beverages to avoid  Certain beverages may make dehydration worse. While you rehydrate, avoid drinking alcohol. How to tell if you are recovering from dehydration You may be recovering from dehydration if: You are urinating more often than before you started rehydrating. Your urine is pale yellow. Your energy level improves. You vomit less frequently. You have diarrhea less frequently. Your appetite improves or returns to normal. You feel less dizzy or less light-headed. Your skin tone and color start to look more normal. Follow these instructions at home: Take over-the-counter and prescription medicines only as told by your health care provider. Do not take sodium tablets. Doing this can lead to having too much sodium in your body (hypernatremia). Contact a health care provider if: You continue to have symptoms of mild or moderate dehydration, such as: Thirst. Dry lips. Slightly dry mouth. Dizziness. Dark urine or less urine than normal. Muscle cramps. You continue to vomit or have diarrhea. Get help right away if you: Have symptoms of dehydration that get worse. Have a fever. Have a severe headache. Have been vomiting and the following happens: Your vomiting gets worse or does not go away. Your vomit includes blood or green matter (bile). You cannot eat or drink without vomiting. Have problems with urination or bowel movements, such as: Diarrhea that gets worse or does not go away. Blood in your stool (feces). This may cause stool to look black and tarry. Not urinating, or urinating only a small amount of very dark urine, within 6-8 hours. Have trouble breathing. Have symptoms that  get worse with treatment. These symptoms may represent a serious problem that is an emergency. Do not wait to see if the symptoms will go away. Get medical help right away. Call your local emergency services (911 in the [place] Do not drive yourself to the hospital. Summary Rehydration is the replacement of body fluids and minerals (electrolytes) that are lost during dehydration. Follow instructions from your health care provider for rehydration. The kind of fluid and amount you should drink depend on your condition. Slowly increase how much you drink until you have taken the amount recommended by your health care provider. Contact your health care provider if you continue to show signs of mild or moderate dehydration. This information is not intended to replace advice given to you by your health care provider. Make sure you discuss any questions you have with your health care provider. Document Revised: 01/04/2020 Document Reviewed: 11/14/2019 Elsevier Patient Education  [place].

## 2022-05-23 ENCOUNTER — Encounter (HOSPITAL_COMMUNITY): Payer: Self-pay | Admitting: Nurse Practitioner

## 2022-05-23 ENCOUNTER — Other Ambulatory Visit: Payer: Self-pay | Admitting: *Deleted

## 2022-05-23 ENCOUNTER — Inpatient Hospital Stay: Payer: BC Managed Care – PPO

## 2022-05-23 VITALS — BP 117/82 | HR 72 | Temp 98.1°F | Resp 18

## 2022-05-23 DIAGNOSIS — Z5111 Encounter for antineoplastic chemotherapy: Secondary | ICD-10-CM | POA: Diagnosis not present

## 2022-05-23 DIAGNOSIS — T451X5A Adverse effect of antineoplastic and immunosuppressive drugs, initial encounter: Secondary | ICD-10-CM | POA: Diagnosis not present

## 2022-05-23 DIAGNOSIS — Z5189 Encounter for other specified aftercare: Secondary | ICD-10-CM | POA: Diagnosis not present

## 2022-05-23 DIAGNOSIS — Z923 Personal history of irradiation: Secondary | ICD-10-CM | POA: Diagnosis not present

## 2022-05-23 DIAGNOSIS — R634 Abnormal weight loss: Secondary | ICD-10-CM | POA: Diagnosis not present

## 2022-05-23 DIAGNOSIS — C20 Malignant neoplasm of rectum: Secondary | ICD-10-CM

## 2022-05-23 DIAGNOSIS — G620 Drug-induced polyneuropathy: Secondary | ICD-10-CM | POA: Diagnosis not present

## 2022-05-23 DIAGNOSIS — L409 Psoriasis, unspecified: Secondary | ICD-10-CM | POA: Diagnosis not present

## 2022-05-23 DIAGNOSIS — M109 Gout, unspecified: Secondary | ICD-10-CM | POA: Diagnosis not present

## 2022-05-23 DIAGNOSIS — I10 Essential (primary) hypertension: Secondary | ICD-10-CM | POA: Diagnosis not present

## 2022-05-23 MED ORDER — SODIUM CHLORIDE 0.9 % IV SOLN: INTRAVENOUS | Status: AC

## 2022-05-23 MED ORDER — SODIUM CHLORIDE 0.9% FLUSH
10.0000 mL | INTRAVENOUS | Status: DC | PRN
  Administered 2022-05-23: 10 mL

## 2022-05-23 MED ORDER — HEPARIN SOD (PORK) LOCK FLUSH 100 UNIT/ML IV SOLN
500.0000 [IU] | Freq: Once | INTRAVENOUS | Status: AC
  Administered 2022-05-23: 500 [IU] via INTRAVENOUS

## 2022-05-23 NOTE — Patient Instructions (Signed)
Rehydration, Adult Rehydration is the replacement of body fluids, salts, and minerals (electrolytes) that are lost during dehydration. Dehydration is when there is not enough water or other fluids in the body. This happens when you lose more fluids than you take in. Common causes of dehydration include: Not drinking enough fluids. This can occur when you are ill or doing activities that require a lot of energy, especially in hot weather. Conditions that cause loss of water or other fluids, such as diarrhea, vomiting, sweating, or urinating a lot. Other illnesses, such as fever or infection. Certain medicines, such as those that remove excess fluid from the body (diuretics). Symptoms of mild or moderate dehydration may include thirst, dry lips and mouth, and dizziness. Symptoms of severe dehydration may include increased heart rate, confusion, fainting, and not urinating. For severe dehydration, you may need to get fluids through an IV at the hospital. For mild or moderate dehydration, you can usually rehydrate at home by drinking certain fluids as told by your health care provider. What are the risks? Generally, rehydration is safe. However, taking in too much fluid (overhydration) can be a problem. This is rare. Overhydration can cause an electrolyte imbalance, kidney failure, or a decrease in salt (sodium) levels in the body. Supplies needed You will need an oral rehydration solution (ORS) if your health care provider tells you to use one. This is a drink to treat dehydration. It can be found in pharmacies and retail stores. How to rehydrate Fluids Follow instructions from your health care provider for rehydration. The kind of fluid and the amount you should drink depend on your condition. In general, you should choose drinks that you prefer. If told by your health care provider, drink an ORS. Make an ORS by following instructions on the package. Start by drinking small amounts, about  cup (120  mL) every 5-10 minutes. Slowly increase how much you drink until you have taken the amount recommended by your health care provider. Drink enough clear fluids to keep your urine pale yellow. If you were told to drink an ORS, finish it first, then start slowly drinking other clear fluids. Drink fluids such as: Water. This includes sparkling water and flavored water. Drinking only water can lead to having too little sodium in your body (hyponatremia). Follow the advice of your health care provider. Water from ice chips you suck on. Fruit juice with water you add to it (diluted). Sports drinks. Hot or cold herbal teas. Broth-based soups. Milk or milk products. Food Follow instructions from your health care provider about what to eat while you rehydrate. Your health care provider may recommend that you slowly begin eating regular foods in small amounts. Eat foods that contain a healthy balance of electrolytes, such as bananas, oranges, potatoes, tomatoes, and spinach. Avoid foods that are greasy or contain a lot of sugar. In some cases, you may get nutrition through a feeding tube that is passed through your nose and into your stomach (nasogastric tube, or NG tube). This may be done if you have uncontrolled vomiting or diarrhea. Beverages to avoid  Certain beverages may make dehydration worse. While you rehydrate, avoid drinking alcohol. How to tell if you are recovering from dehydration You may be recovering from dehydration if: You are urinating more often than before you started rehydrating. Your urine is pale yellow. Your energy level improves. You vomit less frequently. You have diarrhea less frequently. Your appetite improves or returns to normal. You feel less dizzy or less light-headed.   Your skin tone and color start to look more normal. Follow these instructions at home: Take over-the-counter and prescription medicines only as told by your health care provider. Do not take sodium  tablets. Doing this can lead to having too much sodium in your body (hypernatremia). Contact a health care provider if: You continue to have symptoms of mild or moderate dehydration, such as: Thirst. Dry lips. Slightly dry mouth. Dizziness. Dark urine or less urine than normal. Muscle cramps. You continue to vomit or have diarrhea. Get help right away if you: Have symptoms of dehydration that get worse. Have a fever. Have a severe headache. Have been vomiting and the following happens: Your vomiting gets worse or does not go away. Your vomit includes blood or green matter (bile). You cannot eat or drink without vomiting. Have problems with urination or bowel movements, such as: Diarrhea that gets worse or does not go away. Blood in your stool (feces). This may cause stool to look black and tarry. Not urinating, or urinating only a small amount of very dark urine, within 6-8 hours. Have trouble breathing. Have symptoms that get worse with treatment. These symptoms may represent a serious problem that is an emergency. Do not wait to see if the symptoms will go away. Get medical help right away. Call your local emergency services (911 in the [place] Do not drive yourself to the hospital. Summary Rehydration is the replacement of body fluids and minerals (electrolytes) that are lost during dehydration. Follow instructions from your health care provider for rehydration. The kind of fluid and amount you should drink depend on your condition. Slowly increase how much you drink until you have taken the amount recommended by your health care provider. Contact your health care provider if you continue to show signs of mild or moderate dehydration. This information is not intended to replace advice given to you by your health care provider. Make sure you discuss any questions you have with your health care provider. Document Revised: 01/04/2020 Document Reviewed: 11/14/2019 Elsevier Patient  Education  2023 [employer]  

## 2022-05-26 ENCOUNTER — Inpatient Hospital Stay: Payer: BC Managed Care – PPO

## 2022-05-26 ENCOUNTER — Telehealth: Payer: Self-pay | Admitting: Oncology

## 2022-05-26 VITALS — BP 111/76 | HR 61 | Temp 98.1°F | Resp 18

## 2022-05-26 DIAGNOSIS — C20 Malignant neoplasm of rectum: Secondary | ICD-10-CM

## 2022-05-26 DIAGNOSIS — Z95828 Presence of other vascular implants and grafts: Secondary | ICD-10-CM

## 2022-05-26 DIAGNOSIS — M109 Gout, unspecified: Secondary | ICD-10-CM | POA: Diagnosis not present

## 2022-05-26 DIAGNOSIS — Z923 Personal history of irradiation: Secondary | ICD-10-CM | POA: Diagnosis not present

## 2022-05-26 DIAGNOSIS — G620 Drug-induced polyneuropathy: Secondary | ICD-10-CM | POA: Diagnosis not present

## 2022-05-26 DIAGNOSIS — I10 Essential (primary) hypertension: Secondary | ICD-10-CM | POA: Diagnosis not present

## 2022-05-26 DIAGNOSIS — R634 Abnormal weight loss: Secondary | ICD-10-CM | POA: Diagnosis not present

## 2022-05-26 DIAGNOSIS — Z5189 Encounter for other specified aftercare: Secondary | ICD-10-CM | POA: Diagnosis not present

## 2022-05-26 DIAGNOSIS — L409 Psoriasis, unspecified: Secondary | ICD-10-CM | POA: Diagnosis not present

## 2022-05-26 DIAGNOSIS — Z5111 Encounter for antineoplastic chemotherapy: Secondary | ICD-10-CM | POA: Diagnosis not present

## 2022-05-26 DIAGNOSIS — T451X5A Adverse effect of antineoplastic and immunosuppressive drugs, initial encounter: Secondary | ICD-10-CM | POA: Diagnosis not present

## 2022-05-26 MED ORDER — SODIUM CHLORIDE 0.9 % IV SOLN: INTRAVENOUS | Status: AC

## 2022-05-26 MED ORDER — SODIUM CHLORIDE 0.9% FLUSH
10.0000 mL | Freq: Once | INTRAVENOUS | Status: AC
  Administered 2022-05-26: 10 mL via INTRAVENOUS

## 2022-05-26 MED ORDER — HEPARIN SOD (PORK) LOCK FLUSH 100 UNIT/ML IV SOLN
500.0000 [IU] | Freq: Once | INTRAVENOUS | Status: AC
  Administered 2022-05-26: 500 [IU] via INTRAVENOUS

## 2022-05-26 NOTE — Patient Instructions (Signed)
Rehydration, Adult Rehydration is the replacement of body fluids, salts, and minerals (electrolytes) that are lost during dehydration. Dehydration is when there is not enough water or other fluids in the body. This happens when you lose more fluids than you take in. Common causes of dehydration include: Not drinking enough fluids. This can occur when you are ill or doing activities that require a lot of energy, especially in hot weather. Conditions that cause loss of water or other fluids, such as diarrhea, vomiting, sweating, or urinating a lot. Other illnesses, such as fever or infection. Certain medicines, such as those that remove excess fluid from the body (diuretics). Symptoms of mild or moderate dehydration may include thirst, dry lips and mouth, and dizziness. Symptoms of severe dehydration may include increased heart rate, confusion, fainting, and not urinating. For severe dehydration, you may need to get fluids through an IV at the hospital. For mild or moderate dehydration, you can usually rehydrate at home by drinking certain fluids as told by your health care provider. What are the risks? Generally, rehydration is safe. However, taking in too much fluid (overhydration) can be a problem. This is rare. Overhydration can cause an electrolyte imbalance, kidney failure, or a decrease in salt (sodium) levels in the body. Supplies needed You will need an oral rehydration solution (ORS) if your health care provider tells you to use one. This is a drink to treat dehydration. It can be found in pharmacies and retail stores. How to rehydrate Fluids Follow instructions from your health care provider for rehydration. The kind of fluid and the amount you should drink depend on your condition. In general, you should choose drinks that you prefer. If told by your health care provider, drink an ORS. Make an ORS by following instructions on the package. Start by drinking small amounts, about  cup (120  mL) every 5-10 minutes. Slowly increase how much you drink until you have taken the amount recommended by your health care provider. Drink enough clear fluids to keep your urine pale yellow. If you were told to drink an ORS, finish it first, then start slowly drinking other clear fluids. Drink fluids such as: Water. This includes sparkling water and flavored water. Drinking only water can lead to having too little sodium in your body (hyponatremia). Follow the advice of your health care provider. Water from ice chips you suck on. Fruit juice with water you add to it (diluted). Sports drinks. Hot or cold herbal teas. Broth-based soups. Milk or milk products. Food Follow instructions from your health care provider about what to eat while you rehydrate. Your health care provider may recommend that you slowly begin eating regular foods in small amounts. Eat foods that contain a healthy balance of electrolytes, such as bananas, oranges, potatoes, tomatoes, and spinach. Avoid foods that are greasy or contain a lot of sugar. In some cases, you may get nutrition through a feeding tube that is passed through your nose and into your stomach (nasogastric tube, or NG tube). This may be done if you have uncontrolled vomiting or diarrhea. Beverages to avoid  Certain beverages may make dehydration worse. While you rehydrate, avoid drinking alcohol. How to tell if you are recovering from dehydration You may be recovering from dehydration if: You are urinating more often than before you started rehydrating. Your urine is pale yellow. Your energy level improves. You vomit less frequently. You have diarrhea less frequently. Your appetite improves or returns to normal. You feel less dizzy or less light-headed.   Your skin tone and color start to look more normal. Follow these instructions at home: Take over-the-counter and prescription medicines only as told by your health care provider. Do not take sodium  tablets. Doing this can lead to having too much sodium in your body (hypernatremia). Contact a health care provider if: You continue to have symptoms of mild or moderate dehydration, such as: Thirst. Dry lips. Slightly dry mouth. Dizziness. Dark urine or less urine than normal. Muscle cramps. You continue to vomit or have diarrhea. Get help right away if you: Have symptoms of dehydration that get worse. Have a fever. Have a severe headache. Have been vomiting and the following happens: Your vomiting gets worse or does not go away. Your vomit includes blood or green matter (bile). You cannot eat or drink without vomiting. Have problems with urination or bowel movements, such as: Diarrhea that gets worse or does not go away. Blood in your stool (feces). This may cause stool to look black and tarry. Not urinating, or urinating only a small amount of very dark urine, within 6-8 hours. Have trouble breathing. Have symptoms that get worse with treatment. These symptoms may represent a serious problem that is an emergency. Do not wait to see if the symptoms will go away. Get medical help right away. Call your local emergency services (911 in the [place] Do not drive yourself to the hospital. Summary Rehydration is the replacement of body fluids and minerals (electrolytes) that are lost during dehydration. Follow instructions from your health care provider for rehydration. The kind of fluid and amount you should drink depend on your condition. Slowly increase how much you drink until you have taken the amount recommended by your health care provider. Contact your health care provider if you continue to show signs of mild or moderate dehydration. This information is not intended to replace advice given to you by your health care provider. Make sure you discuss any questions you have with your health care provider. Document Revised: 01/04/2020 Document Reviewed: 11/14/2019 Elsevier Patient  Education  2023 [employer]  

## 2022-05-26 NOTE — Telephone Encounter (Signed)
Patient was given two gas cards per Otilio Carpen she verified for patient to receive two cards. Cards were given on 7/7.

## 2022-05-28 ENCOUNTER — Inpatient Hospital Stay: Payer: BC Managed Care – PPO

## 2022-05-28 VITALS — BP 109/82 | HR 65 | Temp 97.9°F | Resp 18

## 2022-05-28 DIAGNOSIS — Z5189 Encounter for other specified aftercare: Secondary | ICD-10-CM | POA: Diagnosis not present

## 2022-05-28 DIAGNOSIS — G620 Drug-induced polyneuropathy: Secondary | ICD-10-CM | POA: Diagnosis not present

## 2022-05-28 DIAGNOSIS — Z5111 Encounter for antineoplastic chemotherapy: Secondary | ICD-10-CM | POA: Diagnosis not present

## 2022-05-28 DIAGNOSIS — L409 Psoriasis, unspecified: Secondary | ICD-10-CM | POA: Diagnosis not present

## 2022-05-28 DIAGNOSIS — Z923 Personal history of irradiation: Secondary | ICD-10-CM | POA: Diagnosis not present

## 2022-05-28 DIAGNOSIS — R634 Abnormal weight loss: Secondary | ICD-10-CM | POA: Diagnosis not present

## 2022-05-28 DIAGNOSIS — C20 Malignant neoplasm of rectum: Secondary | ICD-10-CM | POA: Diagnosis not present

## 2022-05-28 DIAGNOSIS — M109 Gout, unspecified: Secondary | ICD-10-CM | POA: Diagnosis not present

## 2022-05-28 DIAGNOSIS — I10 Essential (primary) hypertension: Secondary | ICD-10-CM | POA: Diagnosis not present

## 2022-05-28 DIAGNOSIS — Z95828 Presence of other vascular implants and grafts: Secondary | ICD-10-CM

## 2022-05-28 DIAGNOSIS — T451X5A Adverse effect of antineoplastic and immunosuppressive drugs, initial encounter: Secondary | ICD-10-CM | POA: Diagnosis not present

## 2022-05-28 MED ORDER — SODIUM CHLORIDE 0.9% FLUSH
10.0000 mL | Freq: Once | INTRAVENOUS | Status: AC
  Administered 2022-05-28: 10 mL via INTRAVENOUS

## 2022-05-28 MED ORDER — HEPARIN SOD (PORK) LOCK FLUSH 100 UNIT/ML IV SOLN
500.0000 [IU] | Freq: Once | INTRAVENOUS | Status: AC
  Administered 2022-05-28: 500 [IU] via INTRAVENOUS

## 2022-05-28 MED ORDER — SODIUM CHLORIDE 0.9 % IV SOLN: INTRAVENOUS | Status: AC

## 2022-05-28 NOTE — Patient Instructions (Signed)
Rehydration, Adult Rehydration is the replacement of body fluids, salts, and minerals (electrolytes) that are lost during dehydration. Dehydration is when there is not enough water or other fluids in the body. This happens when you lose more fluids than you take in. Common causes of dehydration include: Not drinking enough fluids. This can occur when you are ill or doing activities that require a lot of energy, especially in hot weather. Conditions that cause loss of water or other fluids, such as diarrhea, vomiting, sweating, or urinating a lot. Other illnesses, such as fever or infection. Certain medicines, such as those that remove excess fluid from the body (diuretics). Symptoms of mild or moderate dehydration may include thirst, dry lips and mouth, and dizziness. Symptoms of severe dehydration may include increased heart rate, confusion, fainting, and not urinating. For severe dehydration, you may need to get fluids through an IV at the hospital. For mild or moderate dehydration, you can usually rehydrate at home by drinking certain fluids as told by your health care provider. What are the risks? Generally, rehydration is safe. However, taking in too much fluid (overhydration) can be a problem. This is rare. Overhydration can cause an electrolyte imbalance, kidney failure, or a decrease in salt (sodium) levels in the body. Supplies needed You will need an oral rehydration solution (ORS) if your health care provider tells you to use one. This is a drink to treat dehydration. It can be found in pharmacies and retail stores. How to rehydrate Fluids Follow instructions from your health care provider for rehydration. The kind of fluid and the amount you should drink depend on your condition. In general, you should choose drinks that you prefer. If told by your health care provider, drink an ORS. Make an ORS by following instructions on the package. Start by drinking small amounts, about  cup (120  mL) every 5-10 minutes. Slowly increase how much you drink until you have taken the amount recommended by your health care provider. Drink enough clear fluids to keep your urine pale yellow. If you were told to drink an ORS, finish it first, then start slowly drinking other clear fluids. Drink fluids such as: Water. This includes sparkling water and flavored water. Drinking only water can lead to having too little sodium in your body (hyponatremia). Follow the advice of your health care provider. Water from ice chips you suck on. Fruit juice with water you add to it (diluted). Sports drinks. Hot or cold herbal teas. Broth-based soups. Milk or milk products. Food Follow instructions from your health care provider about what to eat while you rehydrate. Your health care provider may recommend that you slowly begin eating regular foods in small amounts. Eat foods that contain a healthy balance of electrolytes, such as bananas, oranges, potatoes, tomatoes, and spinach. Avoid foods that are greasy or contain a lot of sugar. In some cases, you may get nutrition through a feeding tube that is passed through your nose and into your stomach (nasogastric tube, or NG tube). This may be done if you have uncontrolled vomiting or diarrhea. Beverages to avoid  Certain beverages may make dehydration worse. While you rehydrate, avoid drinking alcohol. How to tell if you are recovering from dehydration You may be recovering from dehydration if: You are urinating more often than before you started rehydrating. Your urine is pale yellow. Your energy level improves. You vomit less frequently. You have diarrhea less frequently. Your appetite improves or returns to normal. You feel less dizzy or less light-headed.   Your skin tone and color start to look more normal. Follow these instructions at home: Take over-the-counter and prescription medicines only as told by your health care provider. Do not take sodium  tablets. Doing this can lead to having too much sodium in your body (hypernatremia). Contact a health care provider if: You continue to have symptoms of mild or moderate dehydration, such as: Thirst. Dry lips. Slightly dry mouth. Dizziness. Dark urine or less urine than normal. Muscle cramps. You continue to vomit or have diarrhea. Get help right away if you: Have symptoms of dehydration that get worse. Have a fever. Have a severe headache. Have been vomiting and the following happens: Your vomiting gets worse or does not go away. Your vomit includes blood or green matter (bile). You cannot eat or drink without vomiting. Have problems with urination or bowel movements, such as: Diarrhea that gets worse or does not go away. Blood in your stool (feces). This may cause stool to look black and tarry. Not urinating, or urinating only a small amount of very dark urine, within 6-8 hours. Have trouble breathing. Have symptoms that get worse with treatment. These symptoms may represent a serious problem that is an emergency. Do not wait to see if the symptoms will go away. Get medical help right away. Call your local emergency services (911 in the [place] Do not drive yourself to the hospital. Summary Rehydration is the replacement of body fluids and minerals (electrolytes) that are lost during dehydration. Follow instructions from your health care provider for rehydration. The kind of fluid and amount you should drink depend on your condition. Slowly increase how much you drink until you have taken the amount recommended by your health care provider. Contact your health care provider if you continue to show signs of mild or moderate dehydration. This information is not intended to replace advice given to you by your health care provider. Make sure you discuss any questions you have with your health care provider. Document Revised: 01/04/2020 Document Reviewed: 11/14/2019 Elsevier Patient  Education  [place] An implanted port is a device that is placed under the skin. It is usually placed in the chest. The device may vary based on the need. Implanted ports can be used to give IV medicine, to take blood, or to give fluids. You may have an implanted port if: You need IV medicine that would be irritating to the small veins in your hands or arms. You need IV medicines, such as chemotherapy, for a long period of time. You need IV nutrition for a long period of time. You may have fewer limitations when using a port than you would if you used other types of long-term IVs. You will also likely be able to return to normal activities after your incision heals. An implanted port has two main parts: Reservoir. The reservoir is the part where a needle is inserted to give medicines or draw blood. The reservoir is round. After the port is placed, it appears as a small, raised area under your skin. Catheter. The catheter is a small, thin tube that connects the reservoir to a vein. Medicine that is inserted into the reservoir goes into the catheter and then into the vein. How is my port accessed? To access your port: A numbing cream may be placed on the skin over the port site. Your health care provider will put on a mask and sterile gloves. The skin over your port will be cleaned  carefully with a germ-killing soap and allowed to dry. Your health care provider will gently pinch the port and insert a needle into it. Your health care provider will check for a blood return to make sure the port is in the vein and is still working (patent). If your port needs to remain accessed to get medicine continuously (constant infusion), your health care provider will place a clear bandage (dressing) over the needle site. The dressing and needle will need to be changed every week, or as told by your health care provider. What is flushing? Flushing helps keep the port  working. Follow instructions from your health care provider about how and when to flush the port. Ports are usually flushed with saline solution or a medicine called heparin. The need for flushing will depend on how the port is used: If the port is only used from time to time to give medicines or draw blood, the port may need to be flushed: Before and after medicines have been given. Before and after blood has been drawn. As part of routine maintenance. Flushing may be recommended every 4-6 weeks. If a constant infusion is running, the port may not need to be flushed. Throw away any syringes in a disposal container that is meant for sharp items (sharps container). You can buy a sharps container from a pharmacy, or you can make one by using an empty hard plastic bottle with a cover. How long will my port stay implanted? The port can stay in for as long as your health care provider thinks it is needed. When it is time for the port to come out, a surgery will be done to remove it. The surgery will be similar to the procedure that was done to put the port in. Follow these instructions at home: Caring for your port and port site Flush your port as told by your health care provider. If you need an infusion over several days, follow instructions from your health care provider about how to take care of your port site. Make sure you: Change your dressing as told by your health care provider. Wash your hands with soap and water for at least 20 seconds before and after you change your dressing. If soap and water are not available, use alcohol-based hand sanitizer. Place any used dressings or infusion bags into a plastic bag. Throw that bag in the trash. Keep the dressing that covers the needle clean and dry. Do not get it wet. Do not use scissors or sharp objects near the infusion tubing. Keep any external tubes clamped, unless they are being used. Check your port site every day for signs of infection. Check  for: Redness, swelling, or pain. Fluid or blood. Warmth. Pus or a bad smell. Protect the skin around the port site. Avoid wearing bra straps that rub or irritate the site. Protect the skin around your port from seat belts. Place a soft pad over your chest if needed. Bathe or shower as told by your health care provider. The site may get wet as long as you are not actively receiving an infusion. General instructions  Return to your normal activities as told by your health care provider. Ask your health care provider what activities are safe for you. Carry a medical alert card or wear a medical alert bracelet at all times. This will let health care providers know that you have an implanted port in case of an emergency. Where to find more information American Cancer Society: www.cancer.org American  Society of Clinical Oncology: www.cancer.net Contact a health care provider if: You have a fever or chills. You have redness, swelling, or pain at the port site. You have fluid or blood coming from your port site. Your incision feels warm to the touch. You have pus or a bad smell coming from the port site. Summary Implanted ports are usually placed in the chest for long-term IV access. Follow instructions from your health care provider about flushing the port and changing bandages (dressings). Take care of the area around your port by avoiding clothing that puts pressure on the area, and by watching for signs of infection. Protect the skin around your port from seat belts. Place a soft pad over your chest if needed. Contact a health care provider if you have a fever or you have redness, swelling, pain, fluid, or a bad smell at the port site. This information is not intended to replace advice given to you by your health care provider. Make sure you discuss any questions you have with your health care provider. Document Revised: 05/07/2021 Document Reviewed: 05/07/2021 Elsevier Patient Education   [place].

## 2022-05-30 ENCOUNTER — Other Ambulatory Visit: Payer: Self-pay | Admitting: *Deleted

## 2022-05-30 ENCOUNTER — Inpatient Hospital Stay: Payer: BC Managed Care – PPO

## 2022-05-30 ENCOUNTER — Encounter (HOSPITAL_COMMUNITY): Payer: Self-pay | Admitting: Nurse Practitioner

## 2022-05-30 VITALS — BP 116/79 | HR 55 | Temp 97.5°F | Resp 18

## 2022-05-30 DIAGNOSIS — R634 Abnormal weight loss: Secondary | ICD-10-CM | POA: Diagnosis not present

## 2022-05-30 DIAGNOSIS — Z5189 Encounter for other specified aftercare: Secondary | ICD-10-CM | POA: Diagnosis not present

## 2022-05-30 DIAGNOSIS — Z923 Personal history of irradiation: Secondary | ICD-10-CM | POA: Diagnosis not present

## 2022-05-30 DIAGNOSIS — L409 Psoriasis, unspecified: Secondary | ICD-10-CM | POA: Diagnosis not present

## 2022-05-30 DIAGNOSIS — I10 Essential (primary) hypertension: Secondary | ICD-10-CM | POA: Diagnosis not present

## 2022-05-30 DIAGNOSIS — T451X5A Adverse effect of antineoplastic and immunosuppressive drugs, initial encounter: Secondary | ICD-10-CM | POA: Diagnosis not present

## 2022-05-30 DIAGNOSIS — C20 Malignant neoplasm of rectum: Secondary | ICD-10-CM

## 2022-05-30 DIAGNOSIS — Z5111 Encounter for antineoplastic chemotherapy: Secondary | ICD-10-CM | POA: Diagnosis not present

## 2022-05-30 DIAGNOSIS — G620 Drug-induced polyneuropathy: Secondary | ICD-10-CM | POA: Diagnosis not present

## 2022-05-30 DIAGNOSIS — Z95828 Presence of other vascular implants and grafts: Secondary | ICD-10-CM

## 2022-05-30 DIAGNOSIS — M109 Gout, unspecified: Secondary | ICD-10-CM | POA: Diagnosis not present

## 2022-05-30 MED ORDER — SODIUM CHLORIDE 0.9 % IV SOLN: INTRAVENOUS | Status: AC

## 2022-05-30 MED ORDER — HEPARIN SOD (PORK) LOCK FLUSH 100 UNIT/ML IV SOLN
500.0000 [IU] | Freq: Once | INTRAVENOUS | Status: AC
  Administered 2022-05-30: 500 [IU] via INTRAVENOUS

## 2022-05-30 MED ORDER — SODIUM CHLORIDE 0.9% FLUSH
10.0000 mL | Freq: Once | INTRAVENOUS | Status: AC
  Administered 2022-05-30: 10 mL via INTRAVENOUS

## 2022-05-30 NOTE — Patient Instructions (Signed)
Rehydration, Adult Rehydration is the replacement of body fluids, salts, and minerals (electrolytes) that are lost during dehydration. Dehydration is when there is not enough water or other fluids in the body. This happens when you lose more fluids than you take in. Common causes of dehydration include: Not drinking enough fluids. This can occur when you are ill or doing activities that require a lot of energy, especially in hot weather. Conditions that cause loss of water or other fluids, such as diarrhea, vomiting, sweating, or urinating a lot. Other illnesses, such as fever or infection. Certain medicines, such as those that remove excess fluid from the body (diuretics). Symptoms of mild or moderate dehydration may include thirst, dry lips and mouth, and dizziness. Symptoms of severe dehydration may include increased heart rate, confusion, fainting, and not urinating. For severe dehydration, you may need to get fluids through an IV at the hospital. For mild or moderate dehydration, you can usually rehydrate at home by drinking certain fluids as told by your health care provider. What are the risks? Generally, rehydration is safe. However, taking in too much fluid (overhydration) can be a problem. This is rare. Overhydration can cause an electrolyte imbalance, kidney failure, or a decrease in salt (sodium) levels in the body. Supplies needed You will need an oral rehydration solution (ORS) if your health care provider tells you to use one. This is a drink to treat dehydration. It can be found in pharmacies and retail stores. How to rehydrate Fluids Follow instructions from your health care provider for rehydration. The kind of fluid and the amount you should drink depend on your condition. In general, you should choose drinks that you prefer. If told by your health care provider, drink an ORS. Make an ORS by following instructions on the package. Start by drinking small amounts, about  cup (120  mL) every 5-10 minutes. Slowly increase how much you drink until you have taken the amount recommended by your health care provider. Drink enough clear fluids to keep your urine pale yellow. If you were told to drink an ORS, finish it first, then start slowly drinking other clear fluids. Drink fluids such as: Water. This includes sparkling water and flavored water. Drinking only water can lead to having too little sodium in your body (hyponatremia). Follow the advice of your health care provider. Water from ice chips you suck on. Fruit juice with water you add to it (diluted). Sports drinks. Hot or cold herbal teas. Broth-based soups. Milk or milk products. Food Follow instructions from your health care provider about what to eat while you rehydrate. Your health care provider may recommend that you slowly begin eating regular foods in small amounts. Eat foods that contain a healthy balance of electrolytes, such as bananas, oranges, potatoes, tomatoes, and spinach. Avoid foods that are greasy or contain a lot of sugar. In some cases, you may get nutrition through a feeding tube that is passed through your nose and into your stomach (nasogastric tube, or NG tube). This may be done if you have uncontrolled vomiting or diarrhea. Beverages to avoid  Certain beverages may make dehydration worse. While you rehydrate, avoid drinking alcohol. How to tell if you are recovering from dehydration You may be recovering from dehydration if: You are urinating more often than before you started rehydrating. Your urine is pale yellow. Your energy level improves. You vomit less frequently. You have diarrhea less frequently. Your appetite improves or returns to normal. You feel less dizzy or less light-headed.   Your skin tone and color start to look more normal. Follow these instructions at home: Take over-the-counter and prescription medicines only as told by your health care provider. Do not take sodium  tablets. Doing this can lead to having too much sodium in your body (hypernatremia). Contact a health care provider if: You continue to have symptoms of mild or moderate dehydration, such as: Thirst. Dry lips. Slightly dry mouth. Dizziness. Dark urine or less urine than normal. Muscle cramps. You continue to vomit or have diarrhea. Get help right away if you: Have symptoms of dehydration that get worse. Have a fever. Have a severe headache. Have been vomiting and the following happens: Your vomiting gets worse or does not go away. Your vomit includes blood or green matter (bile). You cannot eat or drink without vomiting. Have problems with urination or bowel movements, such as: Diarrhea that gets worse or does not go away. Blood in your stool (feces). This may cause stool to look black and tarry. Not urinating, or urinating only a small amount of very dark urine, within 6-8 hours. Have trouble breathing. Have symptoms that get worse with treatment. These symptoms may represent a serious problem that is an emergency. Do not wait to see if the symptoms will go away. Get medical help right away. Call your local emergency services (911 in the [place] Do not drive yourself to the hospital. Summary Rehydration is the replacement of body fluids and minerals (electrolytes) that are lost during dehydration. Follow instructions from your health care provider for rehydration. The kind of fluid and amount you should drink depend on your condition. Slowly increase how much you drink until you have taken the amount recommended by your health care provider. Contact your health care provider if you continue to show signs of mild or moderate dehydration. This information is not intended to replace advice given to you by your health care provider. Make sure you discuss any questions you have with your health care provider. Document Revised: 01/04/2020 Document Reviewed: 11/14/2019 Elsevier Patient  Education  2023 [employer]  

## 2022-05-30 NOTE — Progress Notes (Signed)
Placed IVF orders for 06/02/22. Unsure of remainder of week orders.

## 2022-06-01 ENCOUNTER — Other Ambulatory Visit: Payer: Self-pay | Admitting: Oncology

## 2022-06-02 ENCOUNTER — Inpatient Hospital Stay: Payer: BC Managed Care – PPO

## 2022-06-02 VITALS — BP 106/76 | HR 65 | Temp 98.1°F | Resp 18 | Ht 71.0 in | Wt 179.5 lb

## 2022-06-02 DIAGNOSIS — R634 Abnormal weight loss: Secondary | ICD-10-CM | POA: Diagnosis not present

## 2022-06-02 DIAGNOSIS — R7989 Other specified abnormal findings of blood chemistry: Secondary | ICD-10-CM | POA: Insufficient documentation

## 2022-06-02 DIAGNOSIS — I10 Essential (primary) hypertension: Secondary | ICD-10-CM | POA: Diagnosis not present

## 2022-06-02 DIAGNOSIS — K769 Liver disease, unspecified: Secondary | ICD-10-CM | POA: Insufficient documentation

## 2022-06-02 DIAGNOSIS — T451X5A Adverse effect of antineoplastic and immunosuppressive drugs, initial encounter: Secondary | ICD-10-CM | POA: Diagnosis not present

## 2022-06-02 DIAGNOSIS — C20 Malignant neoplasm of rectum: Secondary | ICD-10-CM | POA: Diagnosis not present

## 2022-06-02 DIAGNOSIS — Z95828 Presence of other vascular implants and grafts: Secondary | ICD-10-CM

## 2022-06-02 DIAGNOSIS — N529 Male erectile dysfunction, unspecified: Secondary | ICD-10-CM | POA: Insufficient documentation

## 2022-06-02 DIAGNOSIS — G620 Drug-induced polyneuropathy: Secondary | ICD-10-CM | POA: Diagnosis not present

## 2022-06-02 DIAGNOSIS — N183 Chronic kidney disease, stage 3 unspecified: Secondary | ICD-10-CM | POA: Insufficient documentation

## 2022-06-02 DIAGNOSIS — M109 Gout, unspecified: Secondary | ICD-10-CM | POA: Diagnosis not present

## 2022-06-02 DIAGNOSIS — Z5111 Encounter for antineoplastic chemotherapy: Secondary | ICD-10-CM | POA: Diagnosis not present

## 2022-06-02 DIAGNOSIS — Z5189 Encounter for other specified aftercare: Secondary | ICD-10-CM | POA: Diagnosis not present

## 2022-06-02 DIAGNOSIS — L409 Psoriasis, unspecified: Secondary | ICD-10-CM | POA: Diagnosis not present

## 2022-06-02 DIAGNOSIS — Z923 Personal history of irradiation: Secondary | ICD-10-CM | POA: Diagnosis not present

## 2022-06-02 MED ORDER — SODIUM CHLORIDE 0.9% FLUSH
10.0000 mL | INTRAVENOUS | Status: DC | PRN
  Administered 2022-06-02: 10 mL via INTRAVENOUS

## 2022-06-02 MED ORDER — SODIUM CHLORIDE 0.9 % IV SOLN: INTRAVENOUS | Status: AC

## 2022-06-02 MED ORDER — HEPARIN SOD (PORK) LOCK FLUSH 100 UNIT/ML IV SOLN
500.0000 [IU] | Freq: Once | INTRAVENOUS | Status: AC
  Administered 2022-06-02: 500 [IU] via INTRAVENOUS

## 2022-06-02 NOTE — Patient Instructions (Signed)

## 2022-06-03 ENCOUNTER — Telehealth: Payer: Self-pay | Admitting: Pharmacist

## 2022-06-03 ENCOUNTER — Other Ambulatory Visit (HOSPITAL_BASED_OUTPATIENT_CLINIC_OR_DEPARTMENT_OTHER): Payer: Self-pay

## 2022-06-03 ENCOUNTER — Other Ambulatory Visit (HOSPITAL_COMMUNITY): Payer: Self-pay

## 2022-06-03 ENCOUNTER — Other Ambulatory Visit: Payer: Self-pay | Admitting: *Deleted

## 2022-06-03 ENCOUNTER — Inpatient Hospital Stay: Payer: BC Managed Care – PPO

## 2022-06-03 ENCOUNTER — Inpatient Hospital Stay: Payer: BC Managed Care – PPO | Admitting: Oncology

## 2022-06-03 ENCOUNTER — Telehealth: Payer: Self-pay | Admitting: Pharmacy Technician

## 2022-06-03 ENCOUNTER — Encounter: Payer: Self-pay | Admitting: *Deleted

## 2022-06-03 VITALS — BP 116/80 | HR 86 | Temp 97.9°F | Resp 20 | Ht 71.0 in | Wt 179.2 lb

## 2022-06-03 VITALS — BP 112/79 | HR 69

## 2022-06-03 DIAGNOSIS — Z5189 Encounter for other specified aftercare: Secondary | ICD-10-CM | POA: Diagnosis not present

## 2022-06-03 DIAGNOSIS — Z923 Personal history of irradiation: Secondary | ICD-10-CM | POA: Diagnosis not present

## 2022-06-03 DIAGNOSIS — C20 Malignant neoplasm of rectum: Secondary | ICD-10-CM | POA: Diagnosis not present

## 2022-06-03 DIAGNOSIS — G620 Drug-induced polyneuropathy: Secondary | ICD-10-CM | POA: Diagnosis not present

## 2022-06-03 DIAGNOSIS — R634 Abnormal weight loss: Secondary | ICD-10-CM | POA: Diagnosis not present

## 2022-06-03 DIAGNOSIS — E876 Hypokalemia: Secondary | ICD-10-CM

## 2022-06-03 DIAGNOSIS — M109 Gout, unspecified: Secondary | ICD-10-CM | POA: Diagnosis not present

## 2022-06-03 DIAGNOSIS — Z95828 Presence of other vascular implants and grafts: Secondary | ICD-10-CM

## 2022-06-03 DIAGNOSIS — T451X5A Adverse effect of antineoplastic and immunosuppressive drugs, initial encounter: Secondary | ICD-10-CM | POA: Diagnosis not present

## 2022-06-03 DIAGNOSIS — Z5111 Encounter for antineoplastic chemotherapy: Secondary | ICD-10-CM | POA: Diagnosis not present

## 2022-06-03 DIAGNOSIS — I10 Essential (primary) hypertension: Secondary | ICD-10-CM | POA: Diagnosis not present

## 2022-06-03 DIAGNOSIS — L409 Psoriasis, unspecified: Secondary | ICD-10-CM | POA: Diagnosis not present

## 2022-06-03 LAB — CBC WITH DIFFERENTIAL (CANCER CENTER ONLY)
Abs Immature Granulocytes: 0.94 10*3/uL — ABNORMAL HIGH (ref 0.00–0.07)
Basophils Absolute: 0.1 10*3/uL (ref 0.0–0.1)
Basophils Relative: 1 %
Eosinophils Absolute: 0.1 10*3/uL (ref 0.0–0.5)
Eosinophils Relative: 1 %
HCT: 35.3 % — ABNORMAL LOW (ref 39.0–52.0)
Hemoglobin: 11.9 g/dL — ABNORMAL LOW (ref 13.0–17.0)
Immature Granulocytes: 7 %
Lymphocytes Relative: 11 %
Lymphs Abs: 1.5 10*3/uL (ref 0.7–4.0)
MCH: 32.2 pg (ref 26.0–34.0)
MCHC: 33.7 g/dL (ref 30.0–36.0)
MCV: 95.4 fL (ref 80.0–100.0)
Monocytes Absolute: 0.9 10*3/uL (ref 0.1–1.0)
Monocytes Relative: 6 %
Neutro Abs: 10.4 10*3/uL — ABNORMAL HIGH (ref 1.7–7.7)
Neutrophils Relative %: 74 %
Platelet Count: 92 10*3/uL — ABNORMAL LOW (ref 150–400)
RBC: 3.7 MIL/uL — ABNORMAL LOW (ref 4.22–5.81)
RDW: 20.3 % — ABNORMAL HIGH (ref 11.5–15.5)
WBC Count: 14 10*3/uL — ABNORMAL HIGH (ref 4.0–10.5)
nRBC: 0 % (ref 0.0–0.2)

## 2022-06-03 LAB — CMP (CANCER CENTER ONLY)
ALT: 28 U/L (ref 0–44)
AST: 39 U/L (ref 15–41)
Albumin: 4.3 g/dL (ref 3.5–5.0)
Alkaline Phosphatase: 226 U/L — ABNORMAL HIGH (ref 38–126)
Anion gap: 11 (ref 5–15)
BUN: 17 mg/dL (ref 6–20)
CO2: 32 mmol/L (ref 22–32)
Calcium: 10 mg/dL (ref 8.9–10.3)
Chloride: 94 mmol/L — ABNORMAL LOW (ref 98–111)
Creatinine: 2.03 mg/dL — ABNORMAL HIGH (ref 0.61–1.24)
GFR, Estimated: 37 mL/min — ABNORMAL LOW (ref >60)
Glucose, Bld: 183 mg/dL — ABNORMAL HIGH (ref 70–99)
Potassium: 3.3 mmol/L — ABNORMAL LOW (ref 3.5–5.1)
Sodium: 137 mmol/L (ref 135–145)
Total Bilirubin: 0.9 mg/dL (ref 0.3–1.2)
Total Protein: 6.8 g/dL (ref 6.5–8.1)

## 2022-06-03 LAB — MAGNESIUM: Magnesium: 1.8 mg/dL (ref 1.7–2.4)

## 2022-06-03 MED ORDER — SODIUM CHLORIDE 0.9% FLUSH
10.0000 mL | Freq: Once | INTRAVENOUS | Status: AC
  Administered 2022-06-03: 10 mL via INTRAVENOUS

## 2022-06-03 MED ORDER — SODIUM CHLORIDE 0.9 % IV SOLN: INTRAVENOUS | Status: DC

## 2022-06-03 MED ORDER — POTASSIUM CHLORIDE 10 MEQ/100ML IV SOLN
10.0000 meq | INTRAVENOUS | Status: AC
  Administered 2022-06-03 (×2): 10 meq via INTRAVENOUS
  Filled 2022-06-03: qty 100

## 2022-06-03 MED ORDER — ALLOPURINOL 100 MG PO TABS
ORAL_TABLET | ORAL | 2 refills | Status: DC
Start: 2021-06-10 — End: 2022-06-30

## 2022-06-03 MED ORDER — CAPECITABINE 500 MG PO TABS
ORAL_TABLET | ORAL | 0 refills | Status: DC
  Filled 2022-06-03: qty 168, fill #0

## 2022-06-03 MED ORDER — AMLODIPINE BESY-BENAZEPRIL HCL 10-20 MG PO CAPS: ORAL_CAPSULE | ORAL | 1 refills | Status: DC

## 2022-06-03 MED ORDER — HEPARIN SOD (PORK) LOCK FLUSH 100 UNIT/ML IV SOLN
500.0000 [IU] | Freq: Once | INTRAVENOUS | Status: AC
  Administered 2022-06-03: 500 [IU] via INTRAVENOUS

## 2022-06-03 MED ORDER — PROCHLORPERAZINE MALEATE 10 MG PO TABS: ORAL_TABLET | ORAL | 2 refills | Status: DC

## 2022-06-03 MED ORDER — POTASSIUM CHLORIDE ER 20 MEQ PO TBCR: EXTENDED_RELEASE_TABLET | ORAL | 3 refills | Status: DC

## 2022-06-03 MED ORDER — CAPECITABINE 500 MG PO TABS
1500.0000 mg | ORAL_TABLET | Freq: Two times a day (BID) | ORAL | 0 refills | Status: DC
  Filled 2022-06-03 – 2022-06-10 (×2): qty 168, 28d supply, fill #0

## 2022-06-03 NOTE — Patient Instructions (Signed)
Rehydration, Adult Rehydration is the replacement of body fluids, salts, and minerals (electrolytes) that are lost during dehydration. Dehydration is when there is not enough water or other fluids in the body. This happens when you lose more fluids than you take in. Common causes of dehydration include: Not drinking enough fluids. This can occur when you are ill or doing activities that require a lot of energy, especially in hot weather. Conditions that cause loss of water or other fluids, such as diarrhea, vomiting, sweating, or urinating a lot. Other illnesses, such as fever or infection. Certain medicines, such as those that remove excess fluid from the body (diuretics). Symptoms of mild or moderate dehydration may include thirst, dry lips and mouth, and dizziness. Symptoms of severe dehydration may include increased heart rate, confusion, fainting, and not urinating. For severe dehydration, you may need to get fluids through an IV at the hospital. For mild or moderate dehydration, you can usually rehydrate at home by drinking certain fluids as told by your health care provider. What are the risks? Generally, rehydration is safe. However, taking in too much fluid (overhydration) can be a problem. This is rare. Overhydration can cause an electrolyte imbalance, kidney failure, or a decrease in salt (sodium) levels in the body. Supplies needed You will need an oral rehydration solution (ORS) if your health care provider tells you to use one. This is a drink to treat dehydration. It can be found in pharmacies and retail stores. How to rehydrate Fluids Follow instructions from your health care provider for rehydration. The kind of fluid and the amount you should drink depend on your condition. In general, you should choose drinks that you prefer. If told by your health care provider, drink an ORS. Make an ORS by following instructions on the package. Start by drinking small amounts, about  cup (120  mL) every 5-10 minutes. Slowly increase how much you drink until you have taken the amount recommended by your health care provider. Drink enough clear fluids to keep your urine pale yellow. If you were told to drink an ORS, finish it first, then start slowly drinking other clear fluids. Drink fluids such as: Water. This includes sparkling water and flavored water. Drinking only water can lead to having too little sodium in your body (hyponatremia). Follow the advice of your health care provider. Water from ice chips you suck on. Fruit juice with water you add to it (diluted). Sports drinks. Hot or cold herbal teas. Broth-based soups. Milk or milk products. Food Follow instructions from your health care provider about what to eat while you rehydrate. Your health care provider may recommend that you slowly begin eating regular foods in small amounts. Eat foods that contain a healthy balance of electrolytes, such as bananas, oranges, potatoes, tomatoes, and spinach. Avoid foods that are greasy or contain a lot of sugar. In some cases, you may get nutrition through a feeding tube that is passed through your nose and into your stomach (nasogastric tube, or NG tube). This may be done if you have uncontrolled vomiting or diarrhea. Beverages to avoid  Certain beverages may make dehydration worse. While you rehydrate, avoid drinking alcohol. How to tell if you are recovering from dehydration You may be recovering from dehydration if: You are urinating more often than before you started rehydrating. Your urine is pale yellow. Your energy level improves. You vomit less frequently. You have diarrhea less frequently. Your appetite improves or returns to normal. You feel less dizzy or less light-headed.   Your skin tone and color start to look more normal. Follow these instructions at home: Take over-the-counter and prescription medicines only as told by your health care provider. Do not take sodium  tablets. Doing this can lead to having too much sodium in your body (hypernatremia). Contact a health care provider if: You continue to have symptoms of mild or moderate dehydration, such as: Thirst. Dry lips. Slightly dry mouth. Dizziness. Dark urine or less urine than normal. Muscle cramps. You continue to vomit or have diarrhea. Get help right away if you: Have symptoms of dehydration that get worse. Have a fever. Have a severe headache. Have been vomiting and the following happens: Your vomiting gets worse or does not go away. Your vomit includes blood or green matter (bile). You cannot eat or drink without vomiting. Have problems with urination or bowel movements, such as: Diarrhea that gets worse or does not go away. Blood in your stool (feces). This may cause stool to look black and tarry. Not urinating, or urinating only a small amount of very dark urine, within 6-8 hours. Have trouble breathing. Have symptoms that get worse with treatment. These symptoms may represent a serious problem that is an emergency. Do not wait to see if the symptoms will go away. Get medical help right away. Call your local emergency services (911 in the [place] Do not drive yourself to the hospital. Summary Rehydration is the replacement of body fluids and minerals (electrolytes) that are lost during dehydration. Follow instructions from your health care provider for rehydration. The kind of fluid and amount you should drink depend on your condition. Slowly increase how much you drink until you have taken the amount recommended by your health care provider. Contact your health care provider if you continue to show signs of mild or moderate dehydration. This information is not intended to replace advice given to you by your health care provider. Make sure you discuss any questions you have with your health care provider. Document Revised: 01/04/2020 Document Reviewed: 11/14/2019 Elsevier Patient  Education  [place].  Potassium Chloride Injection What is this medication? POTASSIUM CHLORIDE (poe TASS i um KLOOR ide) prevents and treats low levels of potassium in your body. Potassium plays an important role in maintaining the health of your kidneys, heart, muscles, and nervous system. This medicine may be used for other purposes; ask your health care provider or pharmacist if you have questions. COMMON BRAND NAME(S): PROAMP What should I tell my care team before I take this medication? They need to know if you have any of these conditions: Addison disease Dehydration Diabetes (high blood sugar) Heart disease High levels of potassium in the blood Irregular heartbeat or rhythm Kidney disease Large areas of burned skin An unusual or allergic reaction to potassium, other medications, foods, dyes, or preservatives Pregnant or trying to get pregnant Breast-feeding How should I use this medication? This medication is injected into a vein. It is given in a hospital or clinic setting. Talk to your care team about the use of this medication in children. Special care may be needed. Overdosage: If you think you have taken too much of this medicine contact a poison control center or emergency room at once. NOTE: This medicine is only for you. Do not share this medicine with others. What if I miss a dose? This does not apply. This medication is not for regular use. What may interact with this medication? Do not take this medication with any of the following: Certain  diuretics such as spironolactone, triamterene Eplerenone Sodium polystyrene sulfonate This medication may also interact with the following: Certain medications for blood pressure or heart disease like lisinopril, losartan, quinapril, valsartan Medications that lower your chance of fighting infection such as cyclosporine, tacrolimus NSAIDs, medications for pain and inflammation, like ibuprofen or naproxen Other  potassium supplements Salt substitutes This list may not describe all possible interactions. Give your health care provider a list of all the medicines, herbs, non-prescription drugs, or dietary supplements you use. Also tell them if you smoke, drink alcohol, or use illegal drugs. Some items may interact with your medicine. What should I watch for while using this medication? Visit your care team for regular checks on your progress. Tell your care team if your symptoms do not start to get better or if they get worse. You may need blood work while you are taking this medication. Avoid salt substitutes unless you are told otherwise by your care team. What side effects may I notice from receiving this medication? Side effects that you should report to your care team as soon as possible: Allergic reactions--skin rash, itching, hives, swelling of the face, lips, tongue, or throat High potassium level--muscle weakness, fast or irregular heartbeat Side effects that usually do not require medical attention (report to your care team if they continue or are bothersome): Diarrhea Nausea Stomach pain Vomiting This list may not describe all possible side effects. Call your doctor for medical advice about side effects. You may report side effects to FDA at 1-800-FDA-1088. Where should I keep my medication? This medication is given in a hospital or clinic. It will not be stored at home. NOTE: This sheet is a summary. It may not cover all possible information. If you have questions about this medicine, talk to your doctor, pharmacist, or health care provider.  2023 Elsevier/Gold Standard (2021-02-19 00:00:00)

## 2022-06-03 NOTE — Telephone Encounter (Signed)
Oral Oncology Patient Advocate Encounter  Prior Authorization for Capecitabine (Xeloda) has been approved.    PA# [id number] Effective dates: 06/03/2022 through 06/03/2023  Patients co-pay is pending.    Lady Deutscher, CPhT-Adv Pharmacy Patient Advocate Specialist [place] Patient Advocate Team Direct Number: [phone]  Fax: [phone]

## 2022-06-03 NOTE — Progress Notes (Signed)
Patient seen by Dr. [name] today  Vitals are within treatment parameters.  Labs reviewed by Dr. [name] and are not all within treatment parameters. Creatinine 2.03 and platelets 92.  Per physician team, patient will not be receiving treatment today. Will receive IVF today and 3/week. Will also receive 20 meq KCL today

## 2022-06-03 NOTE — Patient Instructions (Signed)

## 2022-06-03 NOTE — Progress Notes (Signed)
[place] OFFICE PROGRESS NOTE   Diagnosis: Rectal cancer  INTERVAL HISTORY:   Kenneth [name] completed another cycle of FOLFOX 05/19/2022.  He has cold sensitivity and peripheral tingling following chemotherapy.  This has resolved over the past several days.  He continues to have high liquid output from the ileostomy.  He empties the bag frequently.  He is drinking fluids.  He continues to receive intravenous fluids 3 days a week at the Cancer center.  He feels better after receiving IV fluids.  He reports increased malaise following chemotherapy.  He had mouth soreness following the last cycle of chemotherapy.  He does not wish to complete another cycle of FOLFOX.  He feels the ileostomy output increases following chemotherapy.  The liquid output persist despite taking 8 Imodium and 8 Lomotil tablets daily.  Objective:  Vital signs in last 24 hours:  Blood pressure 116/80, pulse 86, temperature 97.9 F (36.6 C), temperature source Oral, resp. rate 20, height 5' 11" (1.803 m), weight 179 lb 3.2 oz (81.3 kg), SpO2 98 %.    HEENT: Mild erythema at the palate and upper pharynx, no discrete ulcers, no thrush Resp: Lungs clear bilaterally Cardio: Regular rate and rhythm GI: No hepatosplenomegaly, right abdomen ileostomy Vascular: No leg edema Neuro: Mild loss of vibratory sense at the fingertips bilaterally   Portacath/PICC-without erythema  Lab Results:  Lab Results  Component Value Date   WBC 14.0 (H) 06/03/2022   HGB 11.9 (L) 06/03/2022   HCT 35.3 (L) 06/03/2022   MCV 95.4 06/03/2022   PLT 92 (L) 06/03/2022   NEUTROABS PENDING 06/03/2022    CMP  Lab Results  Component Value Date   NA 136 05/19/2022   K 3.5 05/19/2022   CL 93 (L) 05/19/2022   CO2 32 05/19/2022   GLUCOSE 155 (H) 05/19/2022   BUN 16 05/19/2022   CREATININE 1.76 (H) 05/19/2022   CALCIUM 10.0 05/19/2022   PROT 7.4 05/19/2022   ALBUMIN 4.4 05/19/2022   AST 39 05/19/2022   ALT 30 05/19/2022    ALKPHOS 217 (H) 05/19/2022   BILITOT 0.8 05/19/2022   GFRNONAA 44 (L) 05/19/2022   GFRAA 84 03/17/2008    Lab Results  Component Value Date   CEA 5.43 (H) 02/10/2022    Medications: I have reviewed the patient's current medications.   Assessment/Plan: Rectal cancer-mass at 11 cm on proctoscopy by Dr. Morton Stall 02/04/2022 Colonoscopy 12/26/2021-obstructing mass at the rectosigmoid measured at 15 cm, biopsy invasive moderate to poorly differentiated adenocarcinoma, mismatch repair protein expression intact CTs 12/31/2021-masslike circumferential thickening of the distal sigmoid colon with mildly enlarged pericolonic lymph nodes, numerous subcentimeter hypodense liver lesions, splenomegaly MRI abdomen 01/07/2022-innumerable T2 hyperintense liver lesions consistent with cysts, spleen unremarkable, no ascites or adenopathy, moderate colonic fecal retention, no bowel obstruction MRI pelvis 01/22/2022-T3cN2 tumor above and below the peritoneal reflection, tumor 11.3 cm from the anal verge and 6.1 cm from the sphincter complex, multiple enlarged perirectal nodes, 1.1 cm node versus tumor deposit at the right aspect of the tumor Diverting loop ileostomy 01/15/2022 Cycle 1 FOLFOX 02/17/2022 Cycle 2 FOLFOX 03/04/2022 Cycle 3 FOLFOX held on 03/17/2022 due to neutropenia Cycle 3 FOLFOX 03/17/2022, Udenyca Treatment held 03/31/2022 due to dehydration Cycle 4 FOLFOX 04/07/2022 Cycle 5 FOLFOX 04/22/2022, oxaliplatin dose reduced due to thrombocytopenia Cycle 6 FOLFOX 05/05/2022 Cycle 7 FOLFOX 05/19/2022 Cycle 8 FOLFOX held secondary to patient preference and toxicity (diarrhea/weight loss/neuropathy symptoms) Gout Psoriasis Hypertension Left scalene node versus cyst/lipoma on exam 02/03/2022-CT neck 02/10/2022  with 9.5 mm lymph node in the left supraclavicular region posterior to the sternocleidomastoid muscle.  No other prominent lymph nodes in the neck.  Biopsy 02/12/2022 compatible with benign lymph node, negative for  metastatic carcinoma.      Disposition: Kenneth [name] has clinical stage III rectal cancer.  He has completed 7 cycles of neoadjuvant FOLFOX.  He has a output ileostomy requiring intravenous fluid supplementation.  He has lost weight and is beginning FOLFOX.  He would like to discontinue FOLFOX.  He will receive intravenous fluids with potassium supplementation today.  He will continue IV fluids 3 days/week at the Cancer center.  He is scheduled to follow-up with Dr. Waters later today.  We will arrange for radiation oncology follow-up.  He will likely begin concurrent capecitabine and radiation on 06/16/2022.  We reviewed potential toxicities associated with capecitabine including the chance of mucositis, diarrhea, hyperpigmentation, sun sensitivity, and hand/foot syndrome.  He will not begin capecitabine until the start of radiation.  Kenneth [name] will return for an office and lab visit on 06/16/2022.  Gary Sherrill, MD  06/03/2022  9:00 AM   

## 2022-06-03 NOTE — Telephone Encounter (Signed)
Oral Oncology Patient Advocate Encounter   Received notification that prior authorization for Capecitabine is required.   PA submitted on 06/03/2022 Key [id number] Status is pending     Lady Deutscher, CPhT-Adv Pharmacy Patient Advocate Specialist [facility] Patient Advocate Team Direct Number: [phone]  Fax: [phone]

## 2022-06-03 NOTE — Telephone Encounter (Signed)
Oral Oncology Pharmacy Student Encounter  Received new prescription for capecitabine (Xeloda) for the treatment of clinical stage III rectal cancer in conjunction with radiation, planned duration until done with radiation.  CMP from 06/03/22 assessed, serum creatinine elevated from patient's normal today at 2.03, CrCl approximately 41. Patient received IV hydration in the clinic today, expect improvement in creatinine clearance. Recommend continuing to monitor renal function. Prescription dose and frequency assessed.   Current medication list in Epic reviewed, capecitabine DDIs with allopurinol identified: Allopurinol may decrease serum concentrations of active metabolites of capecitabine *verify if patient is still taking allopurinol, appears to be an old prescription  Evaluated chart and no patient barriers to medication adherence identified.   Prescription has been e-scribed to the [facility] for benefits analysis and approval.  Oral Oncology Clinic will continue to follow for insurance authorization, copayment issues, initial counseling and start date.   Si Raider, PharmD Candidate [address]/DB/AP Oral Chemotherapy Navigation Clinic [phone]  06/03/2022 3:17 PM

## 2022-06-03 NOTE — Progress Notes (Signed)
Provided Kenneth [name] printed information on Xeloda and process to obtain the medication. He is not to begin until day 1 of radiation therapy.

## 2022-06-03 NOTE — Progress Notes (Signed)
Added IVF orders and KCL today

## 2022-06-04 ENCOUNTER — Other Ambulatory Visit (HOSPITAL_COMMUNITY): Payer: Self-pay

## 2022-06-04 NOTE — Telephone Encounter (Signed)
Oral Chemotherapy Pharmacist Encounter  Patient plans on picking up his Xeloda from [facility] after 06/10/22. He knows to start along wit his first day of radiation.   Patient Education I spoke with patient for overview of new oral chemotherapy medication:capecitabine (Xeloda) for the treatment of clinical stage III rectal cancer in conjunction with radiation, planned duration until radiation is complete.   Counseled patient on administration, dosing, side effects, monitoring, drug-food interactions, safe handling, storage, and disposal. Patient will take 3 tablets (1,500 mg total) by mouth 2 (two) times daily after a meal. Take Monday through Friday, take on days of radiation only.  Side effects include but not limited to: diarrhea, hand-foot syndrome, mouth sores, edema, decreased wbc, fatigue, N/V Diarrhea: patient is already having trouble with diarrhea, he knows to call the office if he is having trouble managing the diarrhea Hand-foot syndrome: patient plans on picking up some Udderly Smooth Extra Care 20 when he picks up his medication from [place]. He knows to report any skin changes he notices Mouth sores: Patient has experienced with using magic mouth wash as need to mouth sores. He knows to call if he needs more mouth wash    Reviewed with patient importance of keeping a medication schedule and plan for any missed doses.  After discussion with patient no patient barriers to medication adherence identified.   Kenneth [name] voiced understanding and appreciation. All questions answered. Medication handout provided.  Provided patient with Oral [facility] phone number. Patient knows to call the office with questions or concerns. Oral Chemotherapy Navigation Clinic will continue to follow.  Darl Pikes, PharmD, BCPS, BCOP, CPP Hematology/Oncology Clinical Pharmacist Practitioner [place]/DB/AP Oral [facility] [phone]  06/04/2022 3:02 PM

## 2022-06-05 ENCOUNTER — Other Ambulatory Visit: Payer: Self-pay | Admitting: *Deleted

## 2022-06-05 ENCOUNTER — Inpatient Hospital Stay: Payer: BC Managed Care – PPO

## 2022-06-05 ENCOUNTER — Telehealth: Payer: Self-pay | Admitting: *Deleted

## 2022-06-05 ENCOUNTER — Telehealth: Payer: Self-pay | Admitting: Radiation Oncology

## 2022-06-05 VITALS — BP 101/67 | HR 67 | Temp 98.0°F | Resp 18

## 2022-06-05 DIAGNOSIS — M109 Gout, unspecified: Secondary | ICD-10-CM | POA: Diagnosis not present

## 2022-06-05 DIAGNOSIS — G620 Drug-induced polyneuropathy: Secondary | ICD-10-CM | POA: Diagnosis not present

## 2022-06-05 DIAGNOSIS — E871 Hypo-osmolality and hyponatremia: Secondary | ICD-10-CM

## 2022-06-05 DIAGNOSIS — L409 Psoriasis, unspecified: Secondary | ICD-10-CM | POA: Diagnosis not present

## 2022-06-05 DIAGNOSIS — Z923 Personal history of irradiation: Secondary | ICD-10-CM | POA: Diagnosis not present

## 2022-06-05 DIAGNOSIS — I10 Essential (primary) hypertension: Secondary | ICD-10-CM | POA: Diagnosis not present

## 2022-06-05 DIAGNOSIS — C20 Malignant neoplasm of rectum: Secondary | ICD-10-CM | POA: Diagnosis not present

## 2022-06-05 DIAGNOSIS — Z95828 Presence of other vascular implants and grafts: Secondary | ICD-10-CM

## 2022-06-05 DIAGNOSIS — T451X5A Adverse effect of antineoplastic and immunosuppressive drugs, initial encounter: Secondary | ICD-10-CM | POA: Diagnosis not present

## 2022-06-05 DIAGNOSIS — R634 Abnormal weight loss: Secondary | ICD-10-CM | POA: Diagnosis not present

## 2022-06-05 DIAGNOSIS — Z5111 Encounter for antineoplastic chemotherapy: Secondary | ICD-10-CM | POA: Diagnosis not present

## 2022-06-05 DIAGNOSIS — Z5189 Encounter for other specified aftercare: Secondary | ICD-10-CM | POA: Diagnosis not present

## 2022-06-05 MED ORDER — SODIUM CHLORIDE 0.9% FLUSH
10.0000 mL | Freq: Once | INTRAVENOUS | Status: AC
  Administered 2022-06-05: 10 mL via INTRAVENOUS

## 2022-06-05 MED ORDER — SODIUM CHLORIDE 0.9 % IV SOLN: INTRAVENOUS | Status: AC

## 2022-06-05 MED ORDER — HEPARIN SOD (PORK) LOCK FLUSH 100 UNIT/ML IV SOLN
500.0000 [IU] | Freq: Once | INTRAVENOUS | Status: AC
  Administered 2022-06-05: 500 [IU] via INTRAVENOUS

## 2022-06-05 NOTE — Chronic Care Management (AMB) (Signed)
  Care Coordination  Note  06/05/2022 Name: Kenneth [name] MRN: [id number] DOB: [birth year]  [name] is a 61 y.o. year old male who is a primary care patient of Marda Stalker, [place]. I reached out to [name] by phone today to offer care coordination services.       Follow up plan: Unsuccessful telephone outreach attempt made. A HIPAA compliant phone message was left for the patient providing contact information and requesting a return call.   Julian Hy, [place] Direct Dial: [phone]

## 2022-06-05 NOTE — Progress Notes (Signed)
IV fluid orders placed for week of 7/24

## 2022-06-05 NOTE — Telephone Encounter (Signed)
7/20 @ 9:53 am Left voicemail for patient to call our office to be schedule for consult with Dr. Lisbeth Renshaw.

## 2022-06-05 NOTE — Patient Instructions (Signed)

## 2022-06-06 ENCOUNTER — Inpatient Hospital Stay: Payer: BC Managed Care – PPO

## 2022-06-06 ENCOUNTER — Telehealth: Payer: Self-pay | Admitting: Radiation Oncology

## 2022-06-06 VITALS — BP 101/76 | HR 66 | Temp 97.8°F | Resp 18

## 2022-06-06 DIAGNOSIS — T451X5A Adverse effect of antineoplastic and immunosuppressive drugs, initial encounter: Secondary | ICD-10-CM | POA: Diagnosis not present

## 2022-06-06 DIAGNOSIS — C20 Malignant neoplasm of rectum: Secondary | ICD-10-CM | POA: Diagnosis not present

## 2022-06-06 DIAGNOSIS — Z5111 Encounter for antineoplastic chemotherapy: Secondary | ICD-10-CM | POA: Diagnosis not present

## 2022-06-06 DIAGNOSIS — M109 Gout, unspecified: Secondary | ICD-10-CM | POA: Diagnosis not present

## 2022-06-06 DIAGNOSIS — Z5189 Encounter for other specified aftercare: Secondary | ICD-10-CM | POA: Diagnosis not present

## 2022-06-06 DIAGNOSIS — Z923 Personal history of irradiation: Secondary | ICD-10-CM | POA: Diagnosis not present

## 2022-06-06 DIAGNOSIS — Z95828 Presence of other vascular implants and grafts: Secondary | ICD-10-CM

## 2022-06-06 DIAGNOSIS — R634 Abnormal weight loss: Secondary | ICD-10-CM | POA: Diagnosis not present

## 2022-06-06 DIAGNOSIS — I10 Essential (primary) hypertension: Secondary | ICD-10-CM | POA: Diagnosis not present

## 2022-06-06 DIAGNOSIS — G620 Drug-induced polyneuropathy: Secondary | ICD-10-CM | POA: Diagnosis not present

## 2022-06-06 DIAGNOSIS — L409 Psoriasis, unspecified: Secondary | ICD-10-CM | POA: Diagnosis not present

## 2022-06-06 MED ORDER — SODIUM CHLORIDE 0.9% FLUSH
10.0000 mL | Freq: Once | INTRAVENOUS | Status: AC
  Administered 2022-06-06: 10 mL via INTRAVENOUS

## 2022-06-06 MED ORDER — HEPARIN SOD (PORK) LOCK FLUSH 100 UNIT/ML IV SOLN
500.0000 [IU] | Freq: Once | INTRAVENOUS | Status: AC
  Administered 2022-06-06: 500 [IU] via INTRAVENOUS

## 2022-06-06 MED ORDER — SODIUM CHLORIDE 0.9 % IV SOLN: INTRAVENOUS | Status: AC

## 2022-06-06 NOTE — Telephone Encounter (Signed)
7/21 @ 9:12 am Left voicemail for patient to call our office.

## 2022-06-06 NOTE — Patient Instructions (Signed)
Rehydration, Adult Rehydration is the replacement of body fluids, salts, and minerals (electrolytes) that are lost during dehydration. Dehydration is when there is not enough water or other fluids in the body. This happens when you lose more fluids than you take in. Common causes of dehydration include: Not drinking enough fluids. This can occur when you are ill or doing activities that require a lot of energy, especially in hot weather. Conditions that cause loss of water or other fluids, such as diarrhea, vomiting, sweating, or urinating a lot. Other illnesses, such as fever or infection. Certain medicines, such as those that remove excess fluid from the body (diuretics). Symptoms of mild or moderate dehydration may include thirst, dry lips and mouth, and dizziness. Symptoms of severe dehydration may include increased heart rate, confusion, fainting, and not urinating. For severe dehydration, you may need to get fluids through an IV at the hospital. For mild or moderate dehydration, you can usually rehydrate at home by drinking certain fluids as told by your health care provider. What are the risks? Generally, rehydration is safe. However, taking in too much fluid (overhydration) can be a problem. This is rare. Overhydration can cause an electrolyte imbalance, kidney failure, or a decrease in salt (sodium) levels in the body. Supplies needed You will need an oral rehydration solution (ORS) if your health care provider tells you to use one. This is a drink to treat dehydration. It can be found in pharmacies and retail stores. How to rehydrate Fluids Follow instructions from your health care provider for rehydration. The kind of fluid and the amount you should drink depend on your condition. In general, you should choose drinks that you prefer. If told by your health care provider, drink an ORS. Make an ORS by following instructions on the package. Start by drinking small amounts, about  cup (120  mL) every 5-10 minutes. Slowly increase how much you drink until you have taken the amount recommended by your health care provider. Drink enough clear fluids to keep your urine pale yellow. If you were told to drink an ORS, finish it first, then start slowly drinking other clear fluids. Drink fluids such as: Water. This includes sparkling water and flavored water. Drinking only water can lead to having too little sodium in your body (hyponatremia). Follow the advice of your health care provider. Water from ice chips you suck on. Fruit juice with water you add to it (diluted). Sports drinks. Hot or cold herbal teas. Broth-based soups. Milk or milk products. Food Follow instructions from your health care provider about what to eat while you rehydrate. Your health care provider may recommend that you slowly begin eating regular foods in small amounts. Eat foods that contain a healthy balance of electrolytes, such as bananas, oranges, potatoes, tomatoes, and spinach. Avoid foods that are greasy or contain a lot of sugar. In some cases, you may get nutrition through a feeding tube that is passed through your nose and into your stomach (nasogastric tube, or NG tube). This may be done if you have uncontrolled vomiting or diarrhea. Beverages to avoid  Certain beverages may make dehydration worse. While you rehydrate, avoid drinking alcohol. How to tell if you are recovering from dehydration You may be recovering from dehydration if: You are urinating more often than before you started rehydrating. Your urine is pale yellow. Your energy level improves. You vomit less frequently. You have diarrhea less frequently. Your appetite improves or returns to normal. You feel less dizzy or less light-headed.   Your skin tone and color start to look more normal. Follow these instructions at home: Take over-the-counter and prescription medicines only as told by your health care provider. Do not take sodium  tablets. Doing this can lead to having too much sodium in your body (hypernatremia). Contact a health care provider if: You continue to have symptoms of mild or moderate dehydration, such as: Thirst. Dry lips. Slightly dry mouth. Dizziness. Dark urine or less urine than normal. Muscle cramps. You continue to vomit or have diarrhea. Get help right away if you: Have symptoms of dehydration that get worse. Have a fever. Have a severe headache. Have been vomiting and the following happens: Your vomiting gets worse or does not go away. Your vomit includes blood or green matter (bile). You cannot eat or drink without vomiting. Have problems with urination or bowel movements, such as: Diarrhea that gets worse or does not go away. Blood in your stool (feces). This may cause stool to look black and tarry. Not urinating, or urinating only a small amount of very dark urine, within 6-8 hours. Have trouble breathing. Have symptoms that get worse with treatment. These symptoms may represent a serious problem that is an emergency. Do not wait to see if the symptoms will go away. Get medical help right away. Call your local emergency services (911 in the [place] Do not drive yourself to the hospital. Summary Rehydration is the replacement of body fluids and minerals (electrolytes) that are lost during dehydration. Follow instructions from your health care provider for rehydration. The kind of fluid and amount you should drink depend on your condition. Slowly increase how much you drink until you have taken the amount recommended by your health care provider. Contact your health care provider if you continue to show signs of mild or moderate dehydration. This information is not intended to replace advice given to you by your health care provider. Make sure you discuss any questions you have with your health care provider. Document Revised: 01/04/2020 Document Reviewed: 11/14/2019 Elsevier Patient  Education  2023 [employer]  

## 2022-06-09 ENCOUNTER — Ambulatory Visit
Admission: RE | Admit: 2022-06-09 | Discharge: 2022-06-09 | Disposition: A | Discharge status: Home / Self Care | Payer: BC Managed Care – PPO | Source: Ambulatory Visit | Attending: Radiation Oncology | Admitting: Radiation Oncology

## 2022-06-09 ENCOUNTER — Other Ambulatory Visit: Payer: Self-pay

## 2022-06-09 ENCOUNTER — Inpatient Hospital Stay: Payer: BC Managed Care – PPO

## 2022-06-09 ENCOUNTER — Encounter: Payer: Self-pay | Admitting: Radiation Oncology

## 2022-06-09 ENCOUNTER — Ambulatory Visit
Admission: RE | Admit: 2022-06-09 | Payer: BC Managed Care – PPO | Source: Ambulatory Visit | Admitting: Radiation Oncology

## 2022-06-09 VITALS — BP 108/77 | HR 68 | Resp 20

## 2022-06-09 VITALS — BP 106/74 | HR 76 | Temp 98.0°F | Resp 19 | Ht 71.0 in | Wt 178.0 lb

## 2022-06-09 DIAGNOSIS — Z79899 Other long term (current) drug therapy: Secondary | ICD-10-CM | POA: Insufficient documentation

## 2022-06-09 DIAGNOSIS — Z806 Family history of leukemia: Secondary | ICD-10-CM | POA: Insufficient documentation

## 2022-06-09 DIAGNOSIS — Z803 Family history of malignant neoplasm of breast: Secondary | ICD-10-CM | POA: Diagnosis not present

## 2022-06-09 DIAGNOSIS — Z800 Family history of malignant neoplasm of digestive organs: Secondary | ICD-10-CM | POA: Diagnosis not present

## 2022-06-09 DIAGNOSIS — Z5189 Encounter for other specified aftercare: Secondary | ICD-10-CM | POA: Diagnosis not present

## 2022-06-09 DIAGNOSIS — T451X5A Adverse effect of antineoplastic and immunosuppressive drugs, initial encounter: Secondary | ICD-10-CM | POA: Diagnosis not present

## 2022-06-09 DIAGNOSIS — Z923 Personal history of irradiation: Secondary | ICD-10-CM | POA: Diagnosis not present

## 2022-06-09 DIAGNOSIS — C20 Malignant neoplasm of rectum: Secondary | ICD-10-CM

## 2022-06-09 DIAGNOSIS — F17210 Nicotine dependence, cigarettes, uncomplicated: Secondary | ICD-10-CM | POA: Diagnosis not present

## 2022-06-09 DIAGNOSIS — I10 Essential (primary) hypertension: Secondary | ICD-10-CM | POA: Diagnosis not present

## 2022-06-09 DIAGNOSIS — E860 Dehydration: Secondary | ICD-10-CM | POA: Insufficient documentation

## 2022-06-09 DIAGNOSIS — Z5111 Encounter for antineoplastic chemotherapy: Secondary | ICD-10-CM | POA: Diagnosis not present

## 2022-06-09 DIAGNOSIS — E871 Hypo-osmolality and hyponatremia: Secondary | ICD-10-CM

## 2022-06-09 DIAGNOSIS — M109 Gout, unspecified: Secondary | ICD-10-CM | POA: Diagnosis not present

## 2022-06-09 DIAGNOSIS — Z95828 Presence of other vascular implants and grafts: Secondary | ICD-10-CM

## 2022-06-09 DIAGNOSIS — L409 Psoriasis, unspecified: Secondary | ICD-10-CM | POA: Diagnosis not present

## 2022-06-09 DIAGNOSIS — G620 Drug-induced polyneuropathy: Secondary | ICD-10-CM | POA: Diagnosis not present

## 2022-06-09 DIAGNOSIS — R634 Abnormal weight loss: Secondary | ICD-10-CM | POA: Diagnosis not present

## 2022-06-09 LAB — BASIC METABOLIC PANEL - CANCER CENTER ONLY
Anion gap: 10 (ref 5–15)
BUN: 14 mg/dL (ref 6–20)
CO2: 28 mmol/L (ref 22–32)
Calcium: 10 mg/dL (ref 8.9–10.3)
Chloride: 99 mmol/L (ref 98–111)
Creatinine: 1.4 mg/dL — ABNORMAL HIGH (ref 0.61–1.24)
GFR, Estimated: 58 mL/min — ABNORMAL LOW (ref >60)
Glucose, Bld: 78 mg/dL (ref 70–99)
Potassium: 3.6 mmol/L (ref 3.5–5.1)
Sodium: 137 mmol/L (ref 135–145)

## 2022-06-09 LAB — MAGNESIUM: Magnesium: 1.9 mg/dL (ref 1.7–2.4)

## 2022-06-09 MED ORDER — SODIUM CHLORIDE 0.9 % IV SOLN: INTRAVENOUS | Status: AC

## 2022-06-09 MED ORDER — SODIUM CHLORIDE 0.9% FLUSH
10.0000 mL | Freq: Once | INTRAVENOUS | Status: AC
  Administered 2022-06-09: 10 mL via INTRAVENOUS

## 2022-06-09 MED ORDER — HEPARIN SOD (PORK) LOCK FLUSH 100 UNIT/ML IV SOLN
500.0000 [IU] | Freq: Once | INTRAVENOUS | Status: AC
  Administered 2022-06-09: 500 [IU] via INTRAVENOUS

## 2022-06-09 NOTE — Progress Notes (Addendum)
Radiation Oncology         [phone] ________________________________  Name: Kenneth [name]        MRN: [id number]  Date of Service: 06/09/2022 DOB: 1960-12-13  XB:[id number], Loma Sousa, Kenneth Jordan, MD     REFERRING PHYSICIAN: Ladell Pier, MD   DIAGNOSIS: The encounter diagnosis was Rectal cancer [facility]).   HISTORY OF PRESENT ILLNESS: Kenneth [name] is a 61 y.o. male with a diagnosis of rectal cancer.  The patient presented after a positive Cologuard test in September 2022.  He underwent colonoscopy on 12/26/2021 showing a partially obstructing mass in the rectosigmoid colon at 15 cm.  The mass was circumferential and biopsies were consistent with adenocarcinoma the scope could not be advanced beyond the mass.  CT showed masslike circumferential wall thickening of the distal sigmoid colon suspicious for malignancy with mildly enlarged pericolonic lymph nodes.  There were also some centimeter hypodense lesion in the liver and difficult to characterize.  An MRI on 01/07/2022 at [place] showed innumerable nonenhancing lesions consistent with cysts, gastric wall thickening and no evidence of bowel obstruction.  He was seen by Dr. Morton Stall on 01/09/2022 and proctoscopy identified a visible mass at 11 1/2 cm.  He underwent MRI of the rectum on 01/22/2022 showing the location of his cancer 11.3 cm from the anal verge and 6.1 cm from the sphincter complex the tumor straddles the peritoneal reflection and was characterized as T3cN2.  He did undergo loop ileostomy on 01/15/2022 at [facility].  He was offered total neoadjuvant FOLFOX and began this on 02/17/22.  He did have a  palpable left supraclavicular lymph node on exam and a CT of the neck showed a 9.5 mm node in the left supraclavicular region posterior to the sternocleidomastoid, and this showed benign changes.  He has completed 7 cycles on 06/03/22 and has struggled with high output from his ileostomy. He has decided to forgo the last  cycle of chemotherapy, and is seen today to begin the process of moving forward with chemoradiation.     PREVIOUS RADIATION THERAPY: No   PAST MEDICAL HISTORY:  Past Medical History:  Diagnosis Date   Rectal cancer ([place]) 01/09/2022       PAST SURGICAL HISTORY: Past Surgical History:  Procedure Laterality Date   ILEOSTOMY  01/15/2022   IR IMAGING GUIDED PORT INSERTION  02/12/2022   IR US GUIDE BX ASP/DRAIN  02/12/2022     FAMILY HISTORY:  Family History  Problem Relation Age of Onset   Breast cancer Mother    Leukemia Mother    Pancreatic cancer Father      SOCIAL HISTORY:  reports that he has been smoking. He has never used smokeless tobacco. He reports that he does not currently use alcohol. He reports that he does not currently use drugs.  The patient is married and lives in [place].  He works for [employer].   ALLERGIES: Patient has no known allergies.   MEDICATIONS:  Current Outpatient Medications  Medication Sig Dispense Refill   allopurinol (ZYLOPRIM) 100 MG tablet Take 1 tablet by mouth twice daily. 60 tablet 2   amLODipine-benazepril (LOTREL) 10-20 MG capsule Take 1 capsule by mouth every day as directed. 90 capsule 1   capecitabine (XELODA) 500 MG tablet Take 3 tablets (1,500 mg total) by mouth 2 (two) times daily after a meal. Take Monday through Friday, take on days of radiation only 168 tablet 0   Cholecalciferol (VITAMIN D3) 125  MCG (5000 UT) CAPS Take by mouth. 2 daily     diphenoxylate-atropine (LOMOTIL) 2.5-0.025 MG tablet TAKE 2 TABLETS BY MOUTH FOUR TIMES DAILY AS NEEDED FOR DIARRHEA OR LOOSE STOOLS 75 tablet 0   ferrous sulfate 325 (65 FE) MG tablet Take 325 mg by mouth daily with breakfast.     lidocaine-prilocaine (EMLA) cream Apply 1 application. topically as needed (Start using with 2nd treatment, apply to [facility] site 1-2 hours before treatment). 30 g 0   magic mouthwash SOLN Take 5 mLs by mouth 4 (four) times daily as needed for  mouth pain. Swish and spit 240 mL 0   melatonin 5 MG TABS Take 5 mg by mouth as needed.     ondansetron (ZOFRAN) 8 MG tablet Take 1 tablet (8 mg total) by mouth every 8 (eight) hours as needed for nausea or vomiting (As needed for nausea/vomiting STARTING day 4 after chemo). (Patient not taking: Reported on 06/03/2022) 20 tablet 0   Potassium Chloride ER 20 MEQ TBCR Take 1 tablet (20 MEQ) by mouth once daily. 30 tablet 3   potassium chloride SA (KLOR-CON M) 20 MEQ tablet Take 1 tablet (20 mEq total) by mouth daily. 30 tablet 1   prochlorperazine (COMPAZINE) 10 MG tablet Take 1 tablet (10 mg total) by mouth every 6 (six) hours as needed. 60 tablet 1   prochlorperazine (COMPAZINE) 10 MG tablet Take 1 tablet ('10mg'$ ) by mouth every 6 hours as needed for nausea or vomiting. 30 tablet 2   No current facility-administered medications for this encounter.     REVIEW OF SYSTEMS: On review of systems, the patient reports that he is doing okay but having high volume from his ileostomy despite drinking more than 100 oz of water a day. He reports no bleeding now from the rectum. He reports less frequent mucous per rectum as well. He has been tired during the course of therapy. No other complaints are noted.      PHYSICAL EXAM:  Wt Readings from Last 3 Encounters:  06/09/22 178 lb (80.7 kg)  06/03/22 179 lb 3.2 oz (81.3 kg)  06/02/22 179 lb 8 oz (81.4 kg)   Temp Readings from Last 3 Encounters:  06/09/22 98 F (36.7 C) (Oral)  06/06/22 97.8 F (36.6 C) (Oral)  06/05/22 98 F (36.7 C) (Oral)   BP Readings from Last 3 Encounters:  06/09/22 106/74  06/06/22 101/76  06/05/22 101/67   Pulse Readings from Last 3 Encounters:  06/09/22 76  06/06/22 66  06/05/22 67   Pain Assessment Pain Score: 0-No pain/10  In general this is a well appearing Caucasian male in no acute distress. He's alert and oriented x4 and appropriate throughout the examination. Cardiopulmonary assessment is negative for acute  distress and he exhibits normal effort.    ECOG = 1  0 - Asymptomatic (Fully active, able to carry on all predisease activities without restriction)  1 - Symptomatic but completely ambulatory (Restricted in physically strenuous activity but ambulatory and able to carry out work of a light or sedentary nature. For example, light housework, office work)  2 - Symptomatic, <50% in bed during the day (Ambulatory and capable of all self care but unable to carry out any work activities. Up and about more than 50% of waking hours)  3 - Symptomatic, >50% in bed, but not bedbound (Capable of only limited self-care, confined to bed or chair 50% or more of waking hours)  4 - Bedbound (Completely disabled. Cannot carry on any  self-care. Totally confined to bed or chair)  5 - Death   Eustace Pen MM, Creech RH, Tormey DC, et al. [id number]). "Toxicity and response criteria of the [facility]". [place] Oncol. 5 (6): 649-55    LABORATORY DATA:  Lab Results  Component Value Date   WBC 14.0 (H) 06/03/2022   HGB 11.9 (L) 06/03/2022   HCT 35.3 (L) 06/03/2022   MCV 95.4 06/03/2022   PLT 92 (L) 06/03/2022   Lab Results  Component Value Date   NA 137 06/03/2022   K 3.3 (L) 06/03/2022   CL 94 (L) 06/03/2022   CO2 32 06/03/2022   Lab Results  Component Value Date   ALT 28 06/03/2022   AST 39 06/03/2022   ALKPHOS 226 (H) 06/03/2022   BILITOT 0.9 06/03/2022      RADIOGRAPHY: No results found.     IMPRESSION/PLAN: 1. Stage IIIC, cT3N2M0, adenocarcinoma of the rectum. The patient has completed 7 cycles of therapy and Dr. [name] has recommended moving forward now with chemoradiation. We reviewed the delivery and logstics of therapy as well as the goals of treatment. We discussed the risks, benefits, short, and long term effects of radiotherapy, as well as the curative intent, and the patient is interested in proceeding. Dr. Lisbeth Renshaw recommends a course of 5 1/2 weeks of radiotherapy  to the rectum and regional nodes in the pelvis. Written consent is obtained and placed in the chart, a copy was provided to the patient. He will simulate today and we anticipate starting therapy on 06/16/22. We will also coordinate prehabilitation with physical therapy.  2. Pilonidal Disease. This should not prohibit therapy, and he reports he's not had any clinical symptoms of this during chemotherapy, but this will be followed expectantly.  3. Dehydration from high volume ileostomy. He will continue to follow with Dr. [name] with IV hydration.    In a visit lasting 45 minutes, greater than 50% of the time was spent face to face discussing the patient's condition, in preparation for the discussion, and coordinating the patient's care.        Carola Rhine, [facility]   **Disclaimer: This note was dictated with voice recognition software. Similar sounding words can inadvertently be transcribed and this note may contain transcription errors which may not have been corrected upon publication of note.**

## 2022-06-09 NOTE — Addendum Note (Signed)
Encounter addended by: Hayden Pedro, PA-C on: 06/09/2022 10:06 AM  Actions taken: Clinical Note Signed

## 2022-06-09 NOTE — Progress Notes (Signed)
Follow-up-new appointment. I verified patient's identity and began nursing interview w/ spouse Mrs. Trentan Trippe in attendance. No rectal discomfort reported at this time.  Meaningful use complete.  BP 106/74 (BP Location: Right Arm, Patient Position: Sitting, Cuff Size: Normal)   Pulse 76   Temp 98 F (36.7 C) (Oral)   Resp 19   Ht '5\' 11"'$  (1.803 m)   Wt 178 lb (80.7 kg)   SpO2 100%   BMI 24.83 kg/m

## 2022-06-09 NOTE — Patient Instructions (Signed)
Rehydration, Adult Rehydration is the replacement of body fluids, salts, and minerals (electrolytes) that are lost during dehydration. Dehydration is when there is not enough water or other fluids in the body. This happens when you lose more fluids than you take in. Common causes of dehydration include: Not drinking enough fluids. This can occur when you are ill or doing activities that require a lot of energy, especially in hot weather. Conditions that cause loss of water or other fluids, such as diarrhea, vomiting, sweating, or urinating a lot. Other illnesses, such as fever or infection. Certain medicines, such as those that remove excess fluid from the body (diuretics). Symptoms of mild or moderate dehydration may include thirst, dry lips and mouth, and dizziness. Symptoms of severe dehydration may include increased heart rate, confusion, fainting, and not urinating. For severe dehydration, you may need to get fluids through an IV at the hospital. For mild or moderate dehydration, you can usually rehydrate at home by drinking certain fluids as told by your health care provider. What are the risks? Generally, rehydration is safe. However, taking in too much fluid (overhydration) can be a problem. This is rare. Overhydration can cause an electrolyte imbalance, kidney failure, or a decrease in salt (sodium) levels in the body. Supplies needed You will need an oral rehydration solution (ORS) if your health care provider tells you to use one. This is a drink to treat dehydration. It can be found in pharmacies and retail stores. How to rehydrate Fluids Follow instructions from your health care provider for rehydration. The kind of fluid and the amount you should drink depend on your condition. In general, you should choose drinks that you prefer. If told by your health care provider, drink an ORS. Make an ORS by following instructions on the package. Start by drinking small amounts, about  cup (120  mL) every 5-10 minutes. Slowly increase how much you drink until you have taken the amount recommended by your health care provider. Drink enough clear fluids to keep your urine pale yellow. If you were told to drink an ORS, finish it first, then start slowly drinking other clear fluids. Drink fluids such as: Water. This includes sparkling water and flavored water. Drinking only water can lead to having too little sodium in your body (hyponatremia). Follow the advice of your health care provider. Water from ice chips you suck on. Fruit juice with water you add to it (diluted). Sports drinks. Hot or cold herbal teas. Broth-based soups. Milk or milk products. Food Follow instructions from your health care provider about what to eat while you rehydrate. Your health care provider may recommend that you slowly begin eating regular foods in small amounts. Eat foods that contain a healthy balance of electrolytes, such as bananas, oranges, potatoes, tomatoes, and spinach. Avoid foods that are greasy or contain a lot of sugar. In some cases, you may get nutrition through a feeding tube that is passed through your nose and into your stomach (nasogastric tube, or NG tube). This may be done if you have uncontrolled vomiting or diarrhea. Beverages to avoid  Certain beverages may make dehydration worse. While you rehydrate, avoid drinking alcohol. How to tell if you are recovering from dehydration You may be recovering from dehydration if: You are urinating more often than before you started rehydrating. Your urine is pale yellow. Your energy level improves. You vomit less frequently. You have diarrhea less frequently. Your appetite improves or returns to normal. You feel less dizzy or less light-headed.   Your skin tone and color start to look more normal. Follow these instructions at home: Take over-the-counter and prescription medicines only as told by your health care provider. Do not take sodium  tablets. Doing this can lead to having too much sodium in your body (hypernatremia). Contact a health care provider if: You continue to have symptoms of mild or moderate dehydration, such as: Thirst. Dry lips. Slightly dry mouth. Dizziness. Dark urine or less urine than normal. Muscle cramps. You continue to vomit or have diarrhea. Get help right away if you: Have symptoms of dehydration that get worse. Have a fever. Have a severe headache. Have been vomiting and the following happens: Your vomiting gets worse or does not go away. Your vomit includes blood or green matter (bile). You cannot eat or drink without vomiting. Have problems with urination or bowel movements, such as: Diarrhea that gets worse or does not go away. Blood in your stool (feces). This may cause stool to look black and tarry. Not urinating, or urinating only a small amount of very dark urine, within 6-8 hours. Have trouble breathing. Have symptoms that get worse with treatment. These symptoms may represent a serious problem that is an emergency. Do not wait to see if the symptoms will go away. Get medical help right away. Call your local emergency services (911 in the [place] Do not drive yourself to the hospital. Summary Rehydration is the replacement of body fluids and minerals (electrolytes) that are lost during dehydration. Follow instructions from your health care provider for rehydration. The kind of fluid and amount you should drink depend on your condition. Slowly increase how much you drink until you have taken the amount recommended by your health care provider. Contact your health care provider if you continue to show signs of mild or moderate dehydration. This information is not intended to replace advice given to you by your health care provider. Make sure you discuss any questions you have with your health care provider. Document Revised: 01/04/2020 Document Reviewed: 11/14/2019 Elsevier Patient  Education  2023 [employer]  

## 2022-06-10 ENCOUNTER — Encounter (HOSPITAL_COMMUNITY): Payer: Self-pay | Admitting: Nurse Practitioner

## 2022-06-10 ENCOUNTER — Other Ambulatory Visit: Payer: Self-pay

## 2022-06-10 ENCOUNTER — Other Ambulatory Visit (HOSPITAL_COMMUNITY): Payer: Self-pay

## 2022-06-11 ENCOUNTER — Ambulatory Visit: Payer: BC Managed Care – PPO | Admitting: Radiation Oncology

## 2022-06-11 ENCOUNTER — Other Ambulatory Visit: Payer: Self-pay | Admitting: Oncology

## 2022-06-11 ENCOUNTER — Other Ambulatory Visit (HOSPITAL_BASED_OUTPATIENT_CLINIC_OR_DEPARTMENT_OTHER): Payer: Self-pay

## 2022-06-11 ENCOUNTER — Ambulatory Visit: Payer: BC Managed Care – PPO

## 2022-06-11 ENCOUNTER — Inpatient Hospital Stay: Payer: BC Managed Care – PPO

## 2022-06-11 ENCOUNTER — Encounter: Payer: Self-pay | Admitting: Oncology

## 2022-06-11 ENCOUNTER — Encounter (HOSPITAL_BASED_OUTPATIENT_CLINIC_OR_DEPARTMENT_OTHER): Payer: Self-pay

## 2022-06-11 ENCOUNTER — Encounter (HOSPITAL_COMMUNITY): Payer: Self-pay | Admitting: *Deleted

## 2022-06-11 VITALS — BP 99/70 | HR 61 | Temp 97.7°F | Resp 18

## 2022-06-11 DIAGNOSIS — C20 Malignant neoplasm of rectum: Secondary | ICD-10-CM | POA: Diagnosis not present

## 2022-06-11 DIAGNOSIS — I10 Essential (primary) hypertension: Secondary | ICD-10-CM | POA: Diagnosis not present

## 2022-06-11 DIAGNOSIS — Z95828 Presence of other vascular implants and grafts: Secondary | ICD-10-CM

## 2022-06-11 DIAGNOSIS — Z5189 Encounter for other specified aftercare: Secondary | ICD-10-CM | POA: Diagnosis not present

## 2022-06-11 DIAGNOSIS — Z923 Personal history of irradiation: Secondary | ICD-10-CM | POA: Diagnosis not present

## 2022-06-11 DIAGNOSIS — M109 Gout, unspecified: Secondary | ICD-10-CM | POA: Diagnosis not present

## 2022-06-11 DIAGNOSIS — Z5111 Encounter for antineoplastic chemotherapy: Secondary | ICD-10-CM | POA: Diagnosis not present

## 2022-06-11 DIAGNOSIS — L409 Psoriasis, unspecified: Secondary | ICD-10-CM | POA: Diagnosis not present

## 2022-06-11 DIAGNOSIS — R634 Abnormal weight loss: Secondary | ICD-10-CM | POA: Diagnosis not present

## 2022-06-11 DIAGNOSIS — T451X5A Adverse effect of antineoplastic and immunosuppressive drugs, initial encounter: Secondary | ICD-10-CM | POA: Diagnosis not present

## 2022-06-11 DIAGNOSIS — G620 Drug-induced polyneuropathy: Secondary | ICD-10-CM | POA: Diagnosis not present

## 2022-06-11 DIAGNOSIS — E871 Hypo-osmolality and hyponatremia: Secondary | ICD-10-CM

## 2022-06-11 MED ORDER — HEPARIN SOD (PORK) LOCK FLUSH 100 UNIT/ML IV SOLN
500.0000 [IU] | Freq: Once | INTRAVENOUS | Status: AC
  Administered 2022-06-11: 500 [IU] via INTRAVENOUS

## 2022-06-11 MED ORDER — SODIUM CHLORIDE 0.9% FLUSH
10.0000 mL | Freq: Once | INTRAVENOUS | Status: AC
  Administered 2022-06-11: 10 mL via INTRAVENOUS

## 2022-06-11 MED ORDER — SODIUM CHLORIDE 0.9 % IV SOLN: INTRAVENOUS | Status: AC

## 2022-06-11 MED ORDER — LIDOCAINE-PRILOCAINE 2.5-2.5 % EX CREA
1.0000 | TOPICAL_CREAM | CUTANEOUS | 5 refills | Status: DC | PRN
  Filled 2022-06-11: qty 30, 14d supply, fill #0

## 2022-06-11 NOTE — Progress Notes (Signed)
Approval received from patient's plan for Lidocaine cream 2.5% starting 06/10/22-06/11/23.

## 2022-06-11 NOTE — Patient Instructions (Signed)
Rehydration, Adult Rehydration is the replacement of body fluids, salts, and minerals (electrolytes) that are lost during dehydration. Dehydration is when there is not enough water or other fluids in the body. This happens when you lose more fluids than you take in. Common causes of dehydration include: Not drinking enough fluids. This can occur when you are ill or doing activities that require a lot of energy, especially in hot weather. Conditions that cause loss of water or other fluids, such as diarrhea, vomiting, sweating, or urinating a lot. Other illnesses, such as fever or infection. Certain medicines, such as those that remove excess fluid from the body (diuretics). Symptoms of mild or moderate dehydration may include thirst, dry lips and mouth, and dizziness. Symptoms of severe dehydration may include increased heart rate, confusion, fainting, and not urinating. For severe dehydration, you may need to get fluids through an IV at the hospital. For mild or moderate dehydration, you can usually rehydrate at home by drinking certain fluids as told by your health care provider. What are the risks? Generally, rehydration is safe. However, taking in too much fluid (overhydration) can be a problem. This is rare. Overhydration can cause an electrolyte imbalance, kidney failure, or a decrease in salt (sodium) levels in the body. Supplies needed You will need an oral rehydration solution (ORS) if your health care provider tells you to use one. This is a drink to treat dehydration. It can be found in pharmacies and retail stores. How to rehydrate Fluids Follow instructions from your health care provider for rehydration. The kind of fluid and the amount you should drink depend on your condition. In general, you should choose drinks that you prefer. If told by your health care provider, drink an ORS. Make an ORS by following instructions on the package. Start by drinking small amounts, about  cup (120  mL) every 5-10 minutes. Slowly increase how much you drink until you have taken the amount recommended by your health care provider. Drink enough clear fluids to keep your urine pale yellow. If you were told to drink an ORS, finish it first, then start slowly drinking other clear fluids. Drink fluids such as: Water. This includes sparkling water and flavored water. Drinking only water can lead to having too little sodium in your body (hyponatremia). Follow the advice of your health care provider. Water from ice chips you suck on. Fruit juice with water you add to it (diluted). Sports drinks. Hot or cold herbal teas. Broth-based soups. Milk or milk products. Food Follow instructions from your health care provider about what to eat while you rehydrate. Your health care provider may recommend that you slowly begin eating regular foods in small amounts. Eat foods that contain a healthy balance of electrolytes, such as bananas, oranges, potatoes, tomatoes, and spinach. Avoid foods that are greasy or contain a lot of sugar. In some cases, you may get nutrition through a feeding tube that is passed through your nose and into your stomach (nasogastric tube, or NG tube). This may be done if you have uncontrolled vomiting or diarrhea. Beverages to avoid  Certain beverages may make dehydration worse. While you rehydrate, avoid drinking alcohol. How to tell if you are recovering from dehydration You may be recovering from dehydration if: You are urinating more often than before you started rehydrating. Your urine is pale yellow. Your energy level improves. You vomit less frequently. You have diarrhea less frequently. Your appetite improves or returns to normal. You feel less dizzy or less light-headed.   Your skin tone and color start to look more normal. Follow these instructions at home: Take over-the-counter and prescription medicines only as told by your health care provider. Do not take sodium  tablets. Doing this can lead to having too much sodium in your body (hypernatremia). Contact a health care provider if: You continue to have symptoms of mild or moderate dehydration, such as: Thirst. Dry lips. Slightly dry mouth. Dizziness. Dark urine or less urine than normal. Muscle cramps. You continue to vomit or have diarrhea. Get help right away if you: Have symptoms of dehydration that get worse. Have a fever. Have a severe headache. Have been vomiting and the following happens: Your vomiting gets worse or does not go away. Your vomit includes blood or green matter (bile). You cannot eat or drink without vomiting. Have problems with urination or bowel movements, such as: Diarrhea that gets worse or does not go away. Blood in your stool (feces). This may cause stool to look black and tarry. Not urinating, or urinating only a small amount of very dark urine, within 6-8 hours. Have trouble breathing. Have symptoms that get worse with treatment. These symptoms may represent a serious problem that is an emergency. Do not wait to see if the symptoms will go away. Get medical help right away. Call your local emergency services (911 in the [place] Do not drive yourself to the hospital. Summary Rehydration is the replacement of body fluids and minerals (electrolytes) that are lost during dehydration. Follow instructions from your health care provider for rehydration. The kind of fluid and amount you should drink depend on your condition. Slowly increase how much you drink until you have taken the amount recommended by your health care provider. Contact your health care provider if you continue to show signs of mild or moderate dehydration. This information is not intended to replace advice given to you by your health care provider. Make sure you discuss any questions you have with your health care provider. Document Revised: 01/04/2020 Document Reviewed: 11/14/2019 Elsevier Patient  Education  2023 [employer]  

## 2022-06-12 ENCOUNTER — Other Ambulatory Visit (HOSPITAL_BASED_OUTPATIENT_CLINIC_OR_DEPARTMENT_OTHER): Payer: Self-pay

## 2022-06-12 ENCOUNTER — Encounter: Payer: Self-pay | Admitting: Oncology

## 2022-06-13 ENCOUNTER — Inpatient Hospital Stay: Payer: BC Managed Care – PPO

## 2022-06-13 ENCOUNTER — Other Ambulatory Visit: Payer: Self-pay | Admitting: *Deleted

## 2022-06-13 ENCOUNTER — Other Ambulatory Visit: Payer: Self-pay | Admitting: Oncology

## 2022-06-13 VITALS — BP 106/78 | HR 70 | Temp 97.7°F | Resp 18 | Ht 71.0 in | Wt 188.9 lb

## 2022-06-13 DIAGNOSIS — I10 Essential (primary) hypertension: Secondary | ICD-10-CM | POA: Diagnosis not present

## 2022-06-13 DIAGNOSIS — G620 Drug-induced polyneuropathy: Secondary | ICD-10-CM | POA: Diagnosis not present

## 2022-06-13 DIAGNOSIS — L409 Psoriasis, unspecified: Secondary | ICD-10-CM | POA: Diagnosis not present

## 2022-06-13 DIAGNOSIS — C20 Malignant neoplasm of rectum: Secondary | ICD-10-CM

## 2022-06-13 DIAGNOSIS — E871 Hypo-osmolality and hyponatremia: Secondary | ICD-10-CM

## 2022-06-13 DIAGNOSIS — Z5189 Encounter for other specified aftercare: Secondary | ICD-10-CM | POA: Diagnosis not present

## 2022-06-13 DIAGNOSIS — R634 Abnormal weight loss: Secondary | ICD-10-CM | POA: Diagnosis not present

## 2022-06-13 DIAGNOSIS — Z5111 Encounter for antineoplastic chemotherapy: Secondary | ICD-10-CM | POA: Diagnosis not present

## 2022-06-13 DIAGNOSIS — Z923 Personal history of irradiation: Secondary | ICD-10-CM | POA: Diagnosis not present

## 2022-06-13 DIAGNOSIS — M109 Gout, unspecified: Secondary | ICD-10-CM | POA: Diagnosis not present

## 2022-06-13 DIAGNOSIS — Z95828 Presence of other vascular implants and grafts: Secondary | ICD-10-CM

## 2022-06-13 DIAGNOSIS — T451X5A Adverse effect of antineoplastic and immunosuppressive drugs, initial encounter: Secondary | ICD-10-CM | POA: Diagnosis not present

## 2022-06-13 MED ORDER — SODIUM CHLORIDE 0.9% FLUSH
10.0000 mL | Freq: Once | INTRAVENOUS | Status: AC
  Administered 2022-06-13: 10 mL via INTRAVENOUS

## 2022-06-13 MED ORDER — HEPARIN SOD (PORK) LOCK FLUSH 100 UNIT/ML IV SOLN
500.0000 [IU] | Freq: Once | INTRAVENOUS | Status: AC
  Administered 2022-06-13: 500 [IU] via INTRAVENOUS

## 2022-06-13 MED ORDER — SODIUM CHLORIDE 0.9 % IV SOLN: INTRAVENOUS | Status: AC

## 2022-06-13 NOTE — Patient Instructions (Signed)
Rehydration, Adult Rehydration is the replacement of body fluids, salts, and minerals (electrolytes) that are lost during dehydration. Dehydration is when there is not enough water or other fluids in the body. This happens when you lose more fluids than you take in. Common causes of dehydration include: Not drinking enough fluids. This can occur when you are ill or doing activities that require a lot of energy, especially in hot weather. Conditions that cause loss of water or other fluids, such as diarrhea, vomiting, sweating, or urinating a lot. Other illnesses, such as fever or infection. Certain medicines, such as those that remove excess fluid from the body (diuretics). Symptoms of mild or moderate dehydration may include thirst, dry lips and mouth, and dizziness. Symptoms of severe dehydration may include increased heart rate, confusion, fainting, and not urinating. For severe dehydration, you may need to get fluids through an IV at the hospital. For mild or moderate dehydration, you can usually rehydrate at home by drinking certain fluids as told by your health care provider. What are the risks? Generally, rehydration is safe. However, taking in too much fluid (overhydration) can be a problem. This is rare. Overhydration can cause an electrolyte imbalance, kidney failure, or a decrease in salt (sodium) levels in the body. Supplies needed You will need an oral rehydration solution (ORS) if your health care provider tells you to use one. This is a drink to treat dehydration. It can be found in pharmacies and retail stores. How to rehydrate Fluids Follow instructions from your health care provider for rehydration. The kind of fluid and the amount you should drink depend on your condition. In general, you should choose drinks that you prefer. If told by your health care provider, drink an ORS. Make an ORS by following instructions on the package. Start by drinking small amounts, about  cup (120  mL) every 5-10 minutes. Slowly increase how much you drink until you have taken the amount recommended by your health care provider. Drink enough clear fluids to keep your urine pale yellow. If you were told to drink an ORS, finish it first, then start slowly drinking other clear fluids. Drink fluids such as: Water. This includes sparkling water and flavored water. Drinking only water can lead to having too little sodium in your body (hyponatremia). Follow the advice of your health care provider. Water from ice chips you suck on. Fruit juice with water you add to it (diluted). Sports drinks. Hot or cold herbal teas. Broth-based soups. Milk or milk products. Food Follow instructions from your health care provider about what to eat while you rehydrate. Your health care provider may recommend that you slowly begin eating regular foods in small amounts. Eat foods that contain a healthy balance of electrolytes, such as bananas, oranges, potatoes, tomatoes, and spinach. Avoid foods that are greasy or contain a lot of sugar. In some cases, you may get nutrition through a feeding tube that is passed through your nose and into your stomach (nasogastric tube, or NG tube). This may be done if you have uncontrolled vomiting or diarrhea. Beverages to avoid  Certain beverages may make dehydration worse. While you rehydrate, avoid drinking alcohol. How to tell if you are recovering from dehydration You may be recovering from dehydration if: You are urinating more often than before you started rehydrating. Your urine is pale yellow. Your energy level improves. You vomit less frequently. You have diarrhea less frequently. Your appetite improves or returns to normal. You feel less dizzy or less light-headed.   Your skin tone and color start to look more normal. Follow these instructions at home: Take over-the-counter and prescription medicines only as told by your health care provider. Do not take sodium  tablets. Doing this can lead to having too much sodium in your body (hypernatremia). Contact a health care provider if: You continue to have symptoms of mild or moderate dehydration, such as: Thirst. Dry lips. Slightly dry mouth. Dizziness. Dark urine or less urine than normal. Muscle cramps. You continue to vomit or have diarrhea. Get help right away if you: Have symptoms of dehydration that get worse. Have a fever. Have a severe headache. Have been vomiting and the following happens: Your vomiting gets worse or does not go away. Your vomit includes blood or green matter (bile). You cannot eat or drink without vomiting. Have problems with urination or bowel movements, such as: Diarrhea that gets worse or does not go away. Blood in your stool (feces). This may cause stool to look black and tarry. Not urinating, or urinating only a small amount of very dark urine, within 6-8 hours. Have trouble breathing. Have symptoms that get worse with treatment. These symptoms may represent a serious problem that is an emergency. Do not wait to see if the symptoms will go away. Get medical help right away. Call your local emergency services (911 in the [place] Do not drive yourself to the hospital. Summary Rehydration is the replacement of body fluids and minerals (electrolytes) that are lost during dehydration. Follow instructions from your health care provider for rehydration. The kind of fluid and amount you should drink depend on your condition. Slowly increase how much you drink until you have taken the amount recommended by your health care provider. Contact your health care provider if you continue to show signs of mild or moderate dehydration. This information is not intended to replace advice given to you by your health care provider. Make sure you discuss any questions you have with your health care provider. Document Revised: 01/04/2020 Document Reviewed: 11/14/2019 Elsevier Patient  Education  2023 [employer]  

## 2022-06-16 ENCOUNTER — Other Ambulatory Visit: Payer: Self-pay

## 2022-06-16 ENCOUNTER — Encounter: Payer: Self-pay | Admitting: *Deleted

## 2022-06-16 ENCOUNTER — Inpatient Hospital Stay: Payer: BC Managed Care – PPO

## 2022-06-16 ENCOUNTER — Encounter: Payer: Self-pay | Admitting: Nurse Practitioner

## 2022-06-16 ENCOUNTER — Inpatient Hospital Stay: Payer: BC Managed Care – PPO | Admitting: Nurse Practitioner

## 2022-06-16 ENCOUNTER — Ambulatory Visit
Admission: RE | Admit: 2022-06-16 | Discharge: 2022-06-16 | Disposition: A | Discharge status: Home / Self Care | Payer: BC Managed Care – PPO | Source: Ambulatory Visit | Attending: Radiation Oncology | Admitting: Radiation Oncology

## 2022-06-16 VITALS — BP 120/75 | HR 77 | Temp 98.2°F | Resp 20 | Ht 71.0 in | Wt 190.4 lb

## 2022-06-16 VITALS — BP 116/77 | HR 64

## 2022-06-16 DIAGNOSIS — C20 Malignant neoplasm of rectum: Secondary | ICD-10-CM

## 2022-06-16 DIAGNOSIS — T451X5A Adverse effect of antineoplastic and immunosuppressive drugs, initial encounter: Secondary | ICD-10-CM | POA: Diagnosis not present

## 2022-06-16 DIAGNOSIS — L409 Psoriasis, unspecified: Secondary | ICD-10-CM | POA: Diagnosis not present

## 2022-06-16 DIAGNOSIS — I10 Essential (primary) hypertension: Secondary | ICD-10-CM | POA: Diagnosis not present

## 2022-06-16 DIAGNOSIS — G620 Drug-induced polyneuropathy: Secondary | ICD-10-CM | POA: Diagnosis not present

## 2022-06-16 DIAGNOSIS — Z510 Encounter for antineoplastic radiation therapy: Secondary | ICD-10-CM | POA: Diagnosis not present

## 2022-06-16 DIAGNOSIS — Z5189 Encounter for other specified aftercare: Secondary | ICD-10-CM | POA: Diagnosis not present

## 2022-06-16 DIAGNOSIS — Z923 Personal history of irradiation: Secondary | ICD-10-CM | POA: Diagnosis not present

## 2022-06-16 DIAGNOSIS — E871 Hypo-osmolality and hyponatremia: Secondary | ICD-10-CM

## 2022-06-16 DIAGNOSIS — Z5111 Encounter for antineoplastic chemotherapy: Secondary | ICD-10-CM | POA: Diagnosis not present

## 2022-06-16 DIAGNOSIS — M109 Gout, unspecified: Secondary | ICD-10-CM | POA: Diagnosis not present

## 2022-06-16 DIAGNOSIS — Z95828 Presence of other vascular implants and grafts: Secondary | ICD-10-CM

## 2022-06-16 DIAGNOSIS — R634 Abnormal weight loss: Secondary | ICD-10-CM | POA: Diagnosis not present

## 2022-06-16 LAB — CBC WITH DIFFERENTIAL (CANCER CENTER ONLY)
Abs Immature Granulocytes: 0.02 10*3/uL (ref 0.00–0.07)
Basophils Absolute: 0.1 10*3/uL (ref 0.0–0.1)
Basophils Relative: 1 %
Eosinophils Absolute: 0.1 10*3/uL (ref 0.0–0.5)
Eosinophils Relative: 2 %
HCT: 32.9 % — ABNORMAL LOW (ref 39.0–52.0)
Hemoglobin: 10.8 g/dL — ABNORMAL LOW (ref 13.0–17.0)
Immature Granulocytes: 0 %
Lymphocytes Relative: 13 %
Lymphs Abs: 0.9 10*3/uL (ref 0.7–4.0)
MCH: 33 pg (ref 26.0–34.0)
MCHC: 32.8 g/dL (ref 30.0–36.0)
MCV: 100.6 fL — ABNORMAL HIGH (ref 80.0–100.0)
Monocytes Absolute: 0.6 10*3/uL (ref 0.1–1.0)
Monocytes Relative: 8 %
Neutro Abs: 5.4 10*3/uL (ref 1.7–7.7)
Neutrophils Relative %: 76 %
Platelet Count: 141 10*3/uL — ABNORMAL LOW (ref 150–400)
RBC: 3.27 MIL/uL — ABNORMAL LOW (ref 4.22–5.81)
RDW: 17.4 % — ABNORMAL HIGH (ref 11.5–15.5)
WBC Count: 7 10*3/uL (ref 4.0–10.5)
nRBC: 0 % (ref 0.0–0.2)

## 2022-06-16 LAB — RAD ONC ARIA SESSION SUMMARY
Course Elapsed Days: 0
Plan Fractions Treated to Date: 1
Plan Prescribed Dose Per Fraction: 1.8 Gy
Plan Total Fractions Prescribed: 25
Plan Total Prescribed Dose: 45 Gy
Reference Point Dosage Given to Date: 1.8 Gy
Reference Point Session Dosage Given: 1.8 Gy
Session Number: 1

## 2022-06-16 LAB — CMP (CANCER CENTER ONLY)
ALT: 25 U/L (ref 0–44)
AST: 35 U/L (ref 15–41)
Albumin: 3.9 g/dL (ref 3.5–5.0)
Alkaline Phosphatase: 163 U/L — ABNORMAL HIGH (ref 38–126)
Anion gap: 8 (ref 5–15)
BUN: 15 mg/dL (ref 6–20)
CO2: 26 mmol/L (ref 22–32)
Calcium: 9.5 mg/dL (ref 8.9–10.3)
Chloride: 105 mmol/L (ref 98–111)
Creatinine: 1.17 mg/dL (ref 0.61–1.24)
GFR, Estimated: >60 mL/min (ref >60)
Glucose, Bld: 106 mg/dL — ABNORMAL HIGH (ref 70–99)
Potassium: 4.1 mmol/L (ref 3.5–5.1)
Sodium: 139 mmol/L (ref 135–145)
Total Bilirubin: 0.6 mg/dL (ref 0.3–1.2)
Total Protein: 6.3 g/dL — ABNORMAL LOW (ref 6.5–8.1)

## 2022-06-16 LAB — MAGNESIUM: Magnesium: 1.9 mg/dL (ref 1.7–2.4)

## 2022-06-16 MED ORDER — SODIUM CHLORIDE 0.9 % IV SOLN: INTRAVENOUS | Status: AC

## 2022-06-16 MED ORDER — HEPARIN SOD (PORK) LOCK FLUSH 100 UNIT/ML IV SOLN
500.0000 [IU] | Freq: Once | INTRAVENOUS | Status: AC
  Administered 2022-06-16: 500 [IU] via INTRAVENOUS

## 2022-06-16 MED ORDER — SODIUM CHLORIDE 0.9% FLUSH
10.0000 mL | Freq: Once | INTRAVENOUS | Status: AC
  Administered 2022-06-16: 10 mL via INTRAVENOUS

## 2022-06-16 MED ORDER — MAGIC MOUTHWASH: 5.0000 mL | Freq: Four times a day (QID) | ORAL | 0 refills | Status: DC | PRN

## 2022-06-16 NOTE — Progress Notes (Signed)
  [place] OFFICE PROGRESS NOTE   Diagnosis: Rectal cancer  INTERVAL HISTORY:   Kenneth [name] returns as scheduled.  He began Xeloda this morning.  He is scheduled to begin radiation this afternoon.  Overall feeling better.  He has gained some weight.  Output from the ileostomy is more solid, not watery.  He continues Imodium and Lomotil.  Objective:  Vital signs in last 24 hours:  Blood pressure 120/75, pulse 77, temperature 98.2 F (36.8 C), temperature source Oral, resp. rate 20, height $RemoveBe'5\' 11"'AgJaJikgr$  (1.803 m), weight 190 lb 6.4 oz (86.4 kg), SpO2 100 %.    HEENT: No thrush or ulcers. Resp: Lungs clear bilaterally. Cardio: Regular rate and rhythm. GI: No hepatomegaly.  Right abdomen ileostomy.  Collection bag is empty. Vascular: No leg edema. Skin: Palms without erythema. Port-A-Cath without erythema.   Lab Results:  Lab Results  Component Value Date   WBC 7.0 06/16/2022   HGB 10.8 (L) 06/16/2022   HCT 32.9 (L) 06/16/2022   MCV 100.6 (H) 06/16/2022   PLT 141 (L) 06/16/2022   NEUTROABS 5.4 06/16/2022    Imaging:  No results found.  Medications: I have reviewed the patient's current medications.  Assessment/Plan: Rectal cancer-mass at 11 cm on proctoscopy by Dr. Morton Stall 02/04/2022 Colonoscopy 12/26/2021-obstructing mass at the rectosigmoid measured at 15 cm, biopsy invasive moderate to poorly differentiated adenocarcinoma, mismatch repair protein expression intact CTs 12/31/2021-masslike circumferential thickening of the distal sigmoid colon with mildly enlarged pericolonic lymph nodes, numerous subcentimeter hypodense liver lesions, splenomegaly MRI abdomen 01/07/2022-innumerable T2 hyperintense liver lesions consistent with cysts, spleen unremarkable, no ascites or adenopathy, moderate colonic fecal retention, no bowel obstruction MRI pelvis 01/22/2022-T3cN2 tumor above and below the peritoneal reflection, tumor 11.3 cm from the anal verge and 6.1 cm from the  sphincter complex, multiple enlarged perirectal nodes, 1.1 cm node versus tumor deposit at the right aspect of the tumor Diverting loop ileostomy 01/15/2022 Cycle 1 FOLFOX 02/17/2022 Cycle 2 FOLFOX 03/04/2022 Cycle 3 FOLFOX held on 03/17/2022 due to neutropenia Cycle 3 FOLFOX 03/17/2022, Udenyca Treatment held 03/31/2022 due to dehydration Cycle 4 FOLFOX 04/07/2022 Cycle 5 FOLFOX 04/22/2022, oxaliplatin dose reduced due to thrombocytopenia Cycle 6 FOLFOX 05/05/2022 Cycle 7 FOLFOX 05/19/2022 Cycle 8 FOLFOX held secondary to patient preference and toxicity (diarrhea/weight loss/neuropathy symptoms) 06/16/2022 radiation/Xeloda Gout Psoriasis Hypertension Left scalene node versus cyst/lipoma on exam 02/03/2022-CT neck 02/10/2022 with 9.5 mm lymph node in the left supraclavicular region posterior to the sternocleidomastoid muscle.  No other prominent lymph nodes in the neck.  Biopsy 02/12/2022 compatible with benign lymph node, negative for metastatic carcinoma.  Disposition: Kenneth [name] appears stable.  He begins radiation/Xeloda today.  We again reviewed potential side effects associated with Xeloda.  He agrees to proceed.  CBC and chemistry panel from today reviewed.  Labs adequate to proceed as above.  He will continue IV fluids on a Monday Wednesday Friday schedule.  He will return for follow-up in 2 weeks.  We are available to see him sooner if needed.    [name] ANP/GNP-BC   06/16/2022  10:44 AM

## 2022-06-16 NOTE — Progress Notes (Signed)
Patient seen by [name] NP today  Vitals are within treatment parameters.  Labs reviewed by [name] NP and are within treatment parameters. Only having IV fluids today.  Per physician team,  IVF orders are in for today and remainder of week.

## 2022-06-16 NOTE — Patient Instructions (Signed)
Rehydration, Adult Rehydration is the replacement of body fluids, salts, and minerals (electrolytes) that are lost during dehydration. Dehydration is when there is not enough water or other fluids in the body. This happens when you lose more fluids than you take in. Common causes of dehydration include: Not drinking enough fluids. This can occur when you are ill or doing activities that require a lot of energy, especially in hot weather. Conditions that cause loss of water or other fluids, such as diarrhea, vomiting, sweating, or urinating a lot. Other illnesses, such as fever or infection. Certain medicines, such as those that remove excess fluid from the body (diuretics). Symptoms of mild or moderate dehydration may include thirst, dry lips and mouth, and dizziness. Symptoms of severe dehydration may include increased heart rate, confusion, fainting, and not urinating. For severe dehydration, you may need to get fluids through an IV at the hospital. For mild or moderate dehydration, you can usually rehydrate at home by drinking certain fluids as told by your health care provider. What are the risks? Generally, rehydration is safe. However, taking in too much fluid (overhydration) can be a problem. This is rare. Overhydration can cause an electrolyte imbalance, kidney failure, or a decrease in salt (sodium) levels in the body. Supplies needed You will need an oral rehydration solution (ORS) if your health care provider tells you to use one. This is a drink to treat dehydration. It can be found in pharmacies and retail stores. How to rehydrate Fluids Follow instructions from your health care provider for rehydration. The kind of fluid and the amount you should drink depend on your condition. In general, you should choose drinks that you prefer. If told by your health care provider, drink an ORS. Make an ORS by following instructions on the package. Start by drinking small amounts, about  cup (120  mL) every 5-10 minutes. Slowly increase how much you drink until you have taken the amount recommended by your health care provider. Drink enough clear fluids to keep your urine pale yellow. If you were told to drink an ORS, finish it first, then start slowly drinking other clear fluids. Drink fluids such as: Water. This includes sparkling water and flavored water. Drinking only water can lead to having too little sodium in your body (hyponatremia). Follow the advice of your health care provider. Water from ice chips you suck on. Fruit juice with water you add to it (diluted). Sports drinks. Hot or cold herbal teas. Broth-based soups. Milk or milk products. Food Follow instructions from your health care provider about what to eat while you rehydrate. Your health care provider may recommend that you slowly begin eating regular foods in small amounts. Eat foods that contain a healthy balance of electrolytes, such as bananas, oranges, potatoes, tomatoes, and spinach. Avoid foods that are greasy or contain a lot of sugar. In some cases, you may get nutrition through a feeding tube that is passed through your nose and into your stomach (nasogastric tube, or NG tube). This may be done if you have uncontrolled vomiting or diarrhea. Beverages to avoid  Certain beverages may make dehydration worse. While you rehydrate, avoid drinking alcohol. How to tell if you are recovering from dehydration You may be recovering from dehydration if: You are urinating more often than before you started rehydrating. Your urine is pale yellow. Your energy level improves. You vomit less frequently. You have diarrhea less frequently. Your appetite improves or returns to normal. You feel less dizzy or less light-headed.   Your skin tone and color start to look more normal. Follow these instructions at home: Take over-the-counter and prescription medicines only as told by your health care provider. Do not take sodium  tablets. Doing this can lead to having too much sodium in your body (hypernatremia). Contact a health care provider if: You continue to have symptoms of mild or moderate dehydration, such as: Thirst. Dry lips. Slightly dry mouth. Dizziness. Dark urine or less urine than normal. Muscle cramps. You continue to vomit or have diarrhea. Get help right away if you: Have symptoms of dehydration that get worse. Have a fever. Have a severe headache. Have been vomiting and the following happens: Your vomiting gets worse or does not go away. Your vomit includes blood or green matter (bile). You cannot eat or drink without vomiting. Have problems with urination or bowel movements, such as: Diarrhea that gets worse or does not go away. Blood in your stool (feces). This may cause stool to look black and tarry. Not urinating, or urinating only a small amount of very dark urine, within 6-8 hours. Have trouble breathing. Have symptoms that get worse with treatment. These symptoms may represent a serious problem that is an emergency. Do not wait to see if the symptoms will go away. Get medical help right away. Call your local emergency services (911 in the [place] Do not drive yourself to the hospital. Summary Rehydration is the replacement of body fluids and minerals (electrolytes) that are lost during dehydration. Follow instructions from your health care provider for rehydration. The kind of fluid and amount you should drink depend on your condition. Slowly increase how much you drink until you have taken the amount recommended by your health care provider. Contact your health care provider if you continue to show signs of mild or moderate dehydration. This information is not intended to replace advice given to you by your health care provider. Make sure you discuss any questions you have with your health care provider. Document Revised: 01/04/2020 Document Reviewed: 11/14/2019 Elsevier Patient  Education  2023 [employer]  

## 2022-06-17 ENCOUNTER — Ambulatory Visit: Payer: BC Managed Care – PPO

## 2022-06-17 ENCOUNTER — Other Ambulatory Visit: Payer: Self-pay

## 2022-06-17 ENCOUNTER — Ambulatory Visit: Payer: BC Managed Care – PPO | Attending: Radiation Oncology | Admitting: Physical Therapy

## 2022-06-17 ENCOUNTER — Ambulatory Visit
Admission: RE | Admit: 2022-06-17 | Discharge: 2022-06-17 | Disposition: A | Discharge status: Home / Self Care | Payer: BC Managed Care – PPO | Source: Ambulatory Visit | Attending: Radiation Oncology | Admitting: Radiation Oncology

## 2022-06-17 ENCOUNTER — Encounter: Payer: Self-pay | Admitting: Physical Therapy

## 2022-06-17 DIAGNOSIS — C20 Malignant neoplasm of rectum: Secondary | ICD-10-CM | POA: Insufficient documentation

## 2022-06-17 DIAGNOSIS — T451X5D Adverse effect of antineoplastic and immunosuppressive drugs, subsequent encounter: Secondary | ICD-10-CM | POA: Diagnosis not present

## 2022-06-17 DIAGNOSIS — I10 Essential (primary) hypertension: Secondary | ICD-10-CM | POA: Diagnosis not present

## 2022-06-17 DIAGNOSIS — L409 Psoriasis, unspecified: Secondary | ICD-10-CM | POA: Insufficient documentation

## 2022-06-17 DIAGNOSIS — Z510 Encounter for antineoplastic radiation therapy: Secondary | ICD-10-CM | POA: Diagnosis not present

## 2022-06-17 DIAGNOSIS — R110 Nausea: Secondary | ICD-10-CM | POA: Diagnosis not present

## 2022-06-17 DIAGNOSIS — D61810 Antineoplastic chemotherapy induced pancytopenia: Secondary | ICD-10-CM | POA: Diagnosis not present

## 2022-06-17 DIAGNOSIS — R293 Abnormal posture: Secondary | ICD-10-CM | POA: Insufficient documentation

## 2022-06-17 DIAGNOSIS — M109 Gout, unspecified: Secondary | ICD-10-CM | POA: Insufficient documentation

## 2022-06-17 DIAGNOSIS — M6281 Muscle weakness (generalized): Secondary | ICD-10-CM | POA: Diagnosis not present

## 2022-06-17 DIAGNOSIS — M62838 Other muscle spasm: Secondary | ICD-10-CM | POA: Insufficient documentation

## 2022-06-17 DIAGNOSIS — Z452 Encounter for adjustment and management of vascular access device: Secondary | ICD-10-CM | POA: Insufficient documentation

## 2022-06-17 DIAGNOSIS — Z923 Personal history of irradiation: Secondary | ICD-10-CM | POA: Insufficient documentation

## 2022-06-17 DIAGNOSIS — R279 Unspecified lack of coordination: Secondary | ICD-10-CM | POA: Insufficient documentation

## 2022-06-17 DIAGNOSIS — D6481 Anemia due to antineoplastic chemotherapy: Secondary | ICD-10-CM | POA: Diagnosis not present

## 2022-06-17 DIAGNOSIS — E860 Dehydration: Secondary | ICD-10-CM | POA: Insufficient documentation

## 2022-06-17 LAB — RAD ONC ARIA SESSION SUMMARY
Course Elapsed Days: 1
Plan Fractions Treated to Date: 2
Plan Prescribed Dose Per Fraction: 1.8 Gy
Plan Total Fractions Prescribed: 25
Plan Total Prescribed Dose: 45 Gy
Reference Point Dosage Given to Date: 3.6 Gy
Reference Point Session Dosage Given: 1.8 Gy
Session Number: 2

## 2022-06-17 NOTE — Therapy (Signed)
OUTPATIENT PHYSICAL THERAPY MALE PELVIC EVALUATION   Patient Name: JOHAAN RYSER MRN: [id number] DOB:10/05/1961, 61 y.o., male Today's Date: 06/17/2022   PT End of Session - 06/17/22 1005     Visit Number 1    Date for PT Re-Evaluation 09/17/22    Authorization Type [employer]    PT Start Time 0930    PT Stop Time 1008    PT Time Calculation (min) 38 min    Activity Tolerance Patient tolerated treatment well    Behavior During Therapy [facility] for tasks assessed/performed             Past Medical History:  Diagnosis Date   Rectal cancer ([place]) 01/09/2022   Past Surgical History:  Procedure Laterality Date   ILEOSTOMY  01/15/2022   IR IMAGING GUIDED PORT INSERTION  02/12/2022   IR US GUIDE BX ASP/DRAIN  02/12/2022   Patient Active Problem List   Diagnosis Date Noted   Chronic kidney disease, stage 3 unspecified ([place]) 06/02/2022   Low testosterone 06/02/2022   ED (erectile dysfunction) of organic origin 06/02/2022   Lesion of liver 06/02/2022   Rectal cancer ([place]) 02/06/2022   Gout of right elbow 10/29/2021   Bursitis of right hip 08/15/2020   Olecranon bursitis, right elbow 05/22/2020   Elbow mass, left 03/12/2020   Calcific tendinitis of left elbow 05/23/2019   Degenerative joint disease of knee, left 10/27/2018   Calcific Achilles tendinitis 06/20/2015   HYPOGONADISM 04/13/2009   LOW HDL 04/13/2009   ANKLE PAIN, RIGHT 04/13/2009   ERECTILE DYSFUNCTION 03/02/2009   ALLERGIC RHINITIS 03/02/2009   FATIGUE 03/02/2009   ESSENTIAL HYPERTENSION 04/05/2008   PSORIASIS 06/15/2007   TENDINITIS, ELBOW 06/15/2007   CHONDROMALACIA 06/15/2007   LIVER FUNCTION TESTS, ABNORMAL 06/15/2007    PCP: Marda Stalker, PA-C  REFERRING PROVIDER: Hayden Pedro, PA-C   REFERRING DIAG: C20 (ICD-10-CM) - Rectal cancer ([place])  THERAPY DIAG:  Muscle weakness (generalized)  Unspecified lack of coordination  Other muscle spasm  Abnormal posture  Rationale for Evaluation  and Treatment Rehabilitation  ONSET DATE: 01/22/22  SUBJECTIVE:                                                                                                                                                                                           SUBJECTIVE STATEMENT: Pt reports he had decline in strength and endurance with chemo but is feeling better now, just started radiation yesterday. Pt presents for education and baseline eval for rectal CA.  Fluid intake: "I drink water all day"   PAIN:  Are you having pain? No  PRECAUTIONS: Other: radiation treatment and has  ostomy  WEIGHT BEARING RESTRICTIONS No  FALLS:  Has patient fallen in last 6 months? No  LIVING ENVIRONMENT: Lives with: lives with their family Lives in: House/apartment Stairs: Yes: External: 5 steps  OCCUPATION: not currently working  PLOF: Independent  PATIENT GOALS to maintain as much strength as possible and improve  PERTINENT HISTORY:  ileostomy, positive Cologuard test in September 2022., Rectal cancer-mass at 11 cm on proctoscopy by Dr. Morton Stall 02/04/2022, MRI pelvis 01/22/2022-T3cN2 tumor above and below the peritoneal reflection, tumor 11.3 cm from the anal verge and 6.1 cm from the sphincter complex, multiple enlarged perirectal nodes, 1.1 cm node versus tumor deposit at the right aspect of the tumor, Diverting loop ileostomy 01/15/2022, 7 cycles of FOLFOX 02/17/22-05/19/22 held the 8th; 06/16/22 radiation/Xeloda Sexual abuse: NO   BOWEL MOVEMENT Pain with bowel movement: No - ileostomy   URINATION Pain with urination: No Fully empty bladder: Yes:   Stream: Strong Urgency: No Frequency: normal for him no more than every 3 hours, sometimes 1x per night but not regular  Leakage:  no Pads: No  INTERCOURSE Pain with intercourse:  not active    OBJECTIVE:   DIAGNOSTIC FINDINGS:    COGNITION:  Overall cognitive status: Within functional limits for tasks assessed     SENSATION:  Light touch:  Deficits Lt first digit tingling   Proprioception: Appears intact  MUSCLE LENGTH: Bil hamstrings and adductors limited by 25%   POSTURE: rounded shoulders   LUMBARAROM/PROM  A/PROM A/PROM  eval  Flexion Limited by 50%  Extension WFL  Right lateral flexion Limited y 25%  Left lateral flexion Limited by 25%  Right rotation WFL  Left rotation WFL   (Blank rows = not tested)  LOWER EXTREMITY AROM/PROM:  WFL  LOWER EXTREMITY MMT:   Bil hips grossly 4+/5; knees and ankles 5/5  PALPATION: GENERAL mild TTP around ostomy but no additional tenderness of pain.               External Perineal Exam deferred              Internal Pelvic Floor deferred  Patient confirms identification and approves PT to assess internal pelvic floor and treatment No  PELVIC MMT:   MMT eval  Internal Anal Sphincter   External Anal Sphincter   Puborectalis   Diastasis Recti   (Blank rows = not tested)   TONE: Deferred until after radiation if needed  TODAY'S TREATMENT  EVAL Examination completed, findings reviewed, pt educated on POC, HEP. Pt motivated to participate in PT and agreeable to attempt recommendations.     PATIENT EDUCATION:  Education details: [place] Person educated: Patient and Spouse Education method: Explanation, Demonstration, Corporate treasurer cues, Verbal cues, and Handouts Education comprehension: verbalized understanding and returned demonstration   HOME EXERCISE PROGRAM: [id number]  ASSESSMENT:  CLINICAL IMPRESSION: Patient is a 61 y.o. male  who was seen today for physical therapy evaluation and treatment for baseline evaluation prior to radiation for rectal CA. Pt reports he had extreme fatigue with chemo but now his energy levels are returning and he is stronger than during chemo. Pt did have mild bil hip weakness, and reports decreased endurance overall. Pt had WFL spinal mobility and mild hip decreased flexibility. Pt concerned about his many other appointments with  radiation and fluid appointments for the next several weeks and if possible would like an HEP for home mobility and return post radiation. This is agreeable, pt is very motivated and spouse supportive. Pt understands he may  return if there is a decline in strength or function without change in medical status for additional PT appointments however as able will return post radiation for pelvic floor PT. Pt understands to contact medical provider for changes in medical status or needs. Pt given and reviewed HEP, no additional questions. Pt also educated on log rolling, pressure management for transfers and during HEP. Pt verbalized understanding. Pt would benefit from additional PT to further address deficits.     OBJECTIVE IMPAIRMENTS decreased endurance, decreased mobility, decreased strength, impaired flexibility, improper body mechanics, and postural dysfunction.   ACTIVITY LIMITATIONS carrying, lifting, standing, squatting, transfers, and locomotion level  PARTICIPATION LIMITATIONS: cleaning, laundry, interpersonal relationship, shopping, community activity, and yard work  [place], Time since onset of injury/illness/exacerbation, and 1 comorbidity: medical history  are also affecting patient's functional outcome.   REHAB POTENTIAL: Good  CLINICAL DECISION MAKING: Stable/uncomplicated  EVALUATION COMPLEXITY: Low   GOALS: Goals reviewed with patient? Yes  SHORT TERM GOALS: Target date: 07/15/2022   Pt to be I with HEP.  Baseline: Goal status: INITIAL  2.  Pt will be able to complete gentle workout for 20 minutes without discomfort  Baseline:  Goal status: INITIAL   LONG TERM GOALS: Target date: 07/29/2022   Pt to be I with advanced HEP.  Baseline:  Goal status: INITIAL  2.  Pt to demonstrate at least 5/5 bil hip strength for improved pelvic stability and functional squats without pain or compensatory strategies.  Baseline:  Goal status: INITIAL  3.  Pt will be  able to complete gentle workout for 30 minutes without discomfort  Baseline:  Goal status: INITIAL  4.  Pt to demonstrate improved coordination with pelvic floor and breathing mechanics with body weight squats x10 without pain or compensatory strategies for improved pelvic stability Baseline:  Goal status: INITIAL  5.  Pt to be I with pelvic relaxation/contraction/bulge/relax with minimal cues for regular bowel and bladder habits post ostomy reversal.  Baseline:  Goal status: INITIAL    PLAN: PT FREQUENCY: 1x/week  PT DURATION:  5 sessions  PLANNED INTERVENTIONS: Therapeutic exercises, Therapeutic activity, Neuromuscular re-education, Patient/Family education, Self Care, Joint mobilization, Aquatic Therapy, Spinal mobilization, Cryotherapy, Moist heat, scar mobilization, Taping, Biofeedback, and Manual therapy  PLAN FOR NEXT SESSION: internal as needed, global strengthening and stretching post radiation, pelvic floor relaxation techniques, voiding mechanics, breathing mechanics   Stacy Gardner, PT, DPT 06/17/2310:07 AM

## 2022-06-18 ENCOUNTER — Other Ambulatory Visit: Payer: Self-pay

## 2022-06-18 ENCOUNTER — Inpatient Hospital Stay: Payer: BC Managed Care – PPO | Attending: Oncology

## 2022-06-18 ENCOUNTER — Ambulatory Visit
Admission: RE | Admit: 2022-06-18 | Discharge: 2022-06-18 | Disposition: A | Discharge status: Home / Self Care | Payer: BC Managed Care – PPO | Source: Ambulatory Visit | Attending: Radiation Oncology | Admitting: Radiation Oncology

## 2022-06-18 VITALS — BP 114/75 | HR 68 | Resp 20

## 2022-06-18 DIAGNOSIS — T451X5D Adverse effect of antineoplastic and immunosuppressive drugs, subsequent encounter: Secondary | ICD-10-CM | POA: Diagnosis not present

## 2022-06-18 DIAGNOSIS — M109 Gout, unspecified: Secondary | ICD-10-CM | POA: Diagnosis not present

## 2022-06-18 DIAGNOSIS — R110 Nausea: Secondary | ICD-10-CM | POA: Diagnosis not present

## 2022-06-18 DIAGNOSIS — D6481 Anemia due to antineoplastic chemotherapy: Secondary | ICD-10-CM | POA: Diagnosis not present

## 2022-06-18 DIAGNOSIS — D61810 Antineoplastic chemotherapy induced pancytopenia: Secondary | ICD-10-CM | POA: Diagnosis not present

## 2022-06-18 DIAGNOSIS — Z923 Personal history of irradiation: Secondary | ICD-10-CM | POA: Diagnosis not present

## 2022-06-18 DIAGNOSIS — E860 Dehydration: Secondary | ICD-10-CM | POA: Diagnosis not present

## 2022-06-18 DIAGNOSIS — Z452 Encounter for adjustment and management of vascular access device: Secondary | ICD-10-CM | POA: Diagnosis not present

## 2022-06-18 DIAGNOSIS — C20 Malignant neoplasm of rectum: Secondary | ICD-10-CM

## 2022-06-18 DIAGNOSIS — Z510 Encounter for antineoplastic radiation therapy: Secondary | ICD-10-CM | POA: Diagnosis not present

## 2022-06-18 DIAGNOSIS — L409 Psoriasis, unspecified: Secondary | ICD-10-CM | POA: Diagnosis not present

## 2022-06-18 DIAGNOSIS — E871 Hypo-osmolality and hyponatremia: Secondary | ICD-10-CM

## 2022-06-18 DIAGNOSIS — Z95828 Presence of other vascular implants and grafts: Secondary | ICD-10-CM

## 2022-06-18 DIAGNOSIS — I10 Essential (primary) hypertension: Secondary | ICD-10-CM | POA: Diagnosis not present

## 2022-06-18 LAB — RAD ONC ARIA SESSION SUMMARY
Course Elapsed Days: 2
Plan Fractions Treated to Date: 3
Plan Prescribed Dose Per Fraction: 1.8 Gy
Plan Total Fractions Prescribed: 25
Plan Total Prescribed Dose: 45 Gy
Reference Point Dosage Given to Date: 5.4 Gy
Reference Point Session Dosage Given: 1.8 Gy
Session Number: 3

## 2022-06-18 MED ORDER — HEPARIN SOD (PORK) LOCK FLUSH 100 UNIT/ML IV SOLN
500.0000 [IU] | Freq: Once | INTRAVENOUS | Status: AC
  Administered 2022-06-18: 500 [IU] via INTRAVENOUS

## 2022-06-18 MED ORDER — SODIUM CHLORIDE 0.9% FLUSH
10.0000 mL | Freq: Once | INTRAVENOUS | Status: AC
  Administered 2022-06-18: 10 mL via INTRAVENOUS

## 2022-06-18 MED ORDER — SODIUM CHLORIDE 0.9 % IV SOLN: INTRAVENOUS | Status: AC

## 2022-06-18 NOTE — Patient Instructions (Signed)
Rehydration, Adult Rehydration is the replacement of body fluids, salts, and minerals (electrolytes) that are lost during dehydration. Dehydration is when there is not enough water or other fluids in the body. This happens when you lose more fluids than you take in. Common causes of dehydration include: Not drinking enough fluids. This can occur when you are ill or doing activities that require a lot of energy, especially in hot weather. Conditions that cause loss of water or other fluids, such as diarrhea, vomiting, sweating, or urinating a lot. Other illnesses, such as fever or infection. Certain medicines, such as those that remove excess fluid from the body (diuretics). Symptoms of mild or moderate dehydration may include thirst, dry lips and mouth, and dizziness. Symptoms of severe dehydration may include increased heart rate, confusion, fainting, and not urinating. For severe dehydration, you may need to get fluids through an IV at the hospital. For mild or moderate dehydration, you can usually rehydrate at home by drinking certain fluids as told by your health care provider. What are the risks? Generally, rehydration is safe. However, taking in too much fluid (overhydration) can be a problem. This is rare. Overhydration can cause an electrolyte imbalance, kidney failure, or a decrease in salt (sodium) levels in the body. Supplies needed You will need an oral rehydration solution (ORS) if your health care provider tells you to use one. This is a drink to treat dehydration. It can be found in pharmacies and retail stores. How to rehydrate Fluids Follow instructions from your health care provider for rehydration. The kind of fluid and the amount you should drink depend on your condition. In general, you should choose drinks that you prefer. If told by your health care provider, drink an ORS. Make an ORS by following instructions on the package. Start by drinking small amounts, about  cup (120  mL) every 5-10 minutes. Slowly increase how much you drink until you have taken the amount recommended by your health care provider. Drink enough clear fluids to keep your urine pale yellow. If you were told to drink an ORS, finish it first, then start slowly drinking other clear fluids. Drink fluids such as: Water. This includes sparkling water and flavored water. Drinking only water can lead to having too little sodium in your body (hyponatremia). Follow the advice of your health care provider. Water from ice chips you suck on. Fruit juice with water you add to it (diluted). Sports drinks. Hot or cold herbal teas. Broth-based soups. Milk or milk products. Food Follow instructions from your health care provider about what to eat while you rehydrate. Your health care provider may recommend that you slowly begin eating regular foods in small amounts. Eat foods that contain a healthy balance of electrolytes, such as bananas, oranges, potatoes, tomatoes, and spinach. Avoid foods that are greasy or contain a lot of sugar. In some cases, you may get nutrition through a feeding tube that is passed through your nose and into your stomach (nasogastric tube, or NG tube). This may be done if you have uncontrolled vomiting or diarrhea. Beverages to avoid  Certain beverages may make dehydration worse. While you rehydrate, avoid drinking alcohol. How to tell if you are recovering from dehydration You may be recovering from dehydration if: You are urinating more often than before you started rehydrating. Your urine is pale yellow. Your energy level improves. You vomit less frequently. You have diarrhea less frequently. Your appetite improves or returns to normal. You feel less dizzy or less light-headed.   Your skin tone and color start to look more normal. Follow these instructions at home: Take over-the-counter and prescription medicines only as told by your health care provider. Do not take sodium  tablets. Doing this can lead to having too much sodium in your body (hypernatremia). Contact a health care provider if: You continue to have symptoms of mild or moderate dehydration, such as: Thirst. Dry lips. Slightly dry mouth. Dizziness. Dark urine or less urine than normal. Muscle cramps. You continue to vomit or have diarrhea. Get help right away if you: Have symptoms of dehydration that get worse. Have a fever. Have a severe headache. Have been vomiting and the following happens: Your vomiting gets worse or does not go away. Your vomit includes blood or green matter (bile). You cannot eat or drink without vomiting. Have problems with urination or bowel movements, such as: Diarrhea that gets worse or does not go away. Blood in your stool (feces). This may cause stool to look black and tarry. Not urinating, or urinating only a small amount of very dark urine, within 6-8 hours. Have trouble breathing. Have symptoms that get worse with treatment. These symptoms may represent a serious problem that is an emergency. Do not wait to see if the symptoms will go away. Get medical help right away. Call your local emergency services (911 in the [place] Do not drive yourself to the hospital. Summary Rehydration is the replacement of body fluids and minerals (electrolytes) that are lost during dehydration. Follow instructions from your health care provider for rehydration. The kind of fluid and amount you should drink depend on your condition. Slowly increase how much you drink until you have taken the amount recommended by your health care provider. Contact your health care provider if you continue to show signs of mild or moderate dehydration. This information is not intended to replace advice given to you by your health care provider. Make sure you discuss any questions you have with your health care provider. Document Revised: 01/04/2020 Document Reviewed: 11/14/2019 Elsevier Patient  Education  2023 [employer]  

## 2022-06-19 ENCOUNTER — Ambulatory Visit
Admission: RE | Admit: 2022-06-19 | Discharge: 2022-06-19 | Disposition: A | Discharge status: Home / Self Care | Payer: BC Managed Care – PPO | Source: Ambulatory Visit | Attending: Radiation Oncology | Admitting: Radiation Oncology

## 2022-06-19 ENCOUNTER — Other Ambulatory Visit: Payer: Self-pay

## 2022-06-19 ENCOUNTER — Other Ambulatory Visit (HOSPITAL_COMMUNITY): Payer: Self-pay

## 2022-06-19 DIAGNOSIS — M109 Gout, unspecified: Secondary | ICD-10-CM | POA: Diagnosis not present

## 2022-06-19 DIAGNOSIS — I10 Essential (primary) hypertension: Secondary | ICD-10-CM | POA: Diagnosis not present

## 2022-06-19 DIAGNOSIS — R110 Nausea: Secondary | ICD-10-CM | POA: Diagnosis not present

## 2022-06-19 DIAGNOSIS — E860 Dehydration: Secondary | ICD-10-CM | POA: Diagnosis not present

## 2022-06-19 DIAGNOSIS — Z510 Encounter for antineoplastic radiation therapy: Secondary | ICD-10-CM | POA: Diagnosis not present

## 2022-06-19 DIAGNOSIS — L409 Psoriasis, unspecified: Secondary | ICD-10-CM | POA: Diagnosis not present

## 2022-06-19 DIAGNOSIS — Z452 Encounter for adjustment and management of vascular access device: Secondary | ICD-10-CM | POA: Diagnosis not present

## 2022-06-19 DIAGNOSIS — D61810 Antineoplastic chemotherapy induced pancytopenia: Secondary | ICD-10-CM | POA: Diagnosis not present

## 2022-06-19 DIAGNOSIS — Z923 Personal history of irradiation: Secondary | ICD-10-CM | POA: Diagnosis not present

## 2022-06-19 DIAGNOSIS — D6481 Anemia due to antineoplastic chemotherapy: Secondary | ICD-10-CM | POA: Diagnosis not present

## 2022-06-19 DIAGNOSIS — C20 Malignant neoplasm of rectum: Secondary | ICD-10-CM | POA: Diagnosis not present

## 2022-06-19 DIAGNOSIS — T451X5D Adverse effect of antineoplastic and immunosuppressive drugs, subsequent encounter: Secondary | ICD-10-CM | POA: Diagnosis not present

## 2022-06-19 LAB — RAD ONC ARIA SESSION SUMMARY
Course Elapsed Days: 3
Plan Fractions Treated to Date: 4
Plan Prescribed Dose Per Fraction: 1.8 Gy
Plan Total Fractions Prescribed: 25
Plan Total Prescribed Dose: 45 Gy
Reference Point Dosage Given to Date: 7.2 Gy
Reference Point Session Dosage Given: 1.8 Gy
Session Number: 4

## 2022-06-19 NOTE — Progress Notes (Signed)
Pt here for patient teaching.  Pt given Radiation and You booklet, skin care instructions, and Sonafine.  Reviewed areas of pertinence such as diarrhea, fatigue, hair loss, sexual and fertility changes, skin changes, and urinary and bladder changes . Pt able to give teach back of to pat skin, use unscented/gentle soap, use baby wipes, have Imodium on hand, drink plenty of water, and sitz bath,apply Sonafine bid and avoid applying anything to skin within 4 hours of treatment. Pt verbalizes understanding of information given and will contact nursing with any questions or concerns.   Gloriajean Dell. Leonie Green, BSN

## 2022-06-20 ENCOUNTER — Other Ambulatory Visit: Payer: Self-pay

## 2022-06-20 ENCOUNTER — Other Ambulatory Visit (HOSPITAL_BASED_OUTPATIENT_CLINIC_OR_DEPARTMENT_OTHER): Payer: Self-pay

## 2022-06-20 ENCOUNTER — Other Ambulatory Visit: Payer: Self-pay | Admitting: *Deleted

## 2022-06-20 ENCOUNTER — Inpatient Hospital Stay: Payer: BC Managed Care – PPO

## 2022-06-20 ENCOUNTER — Ambulatory Visit
Admission: RE | Admit: 2022-06-20 | Discharge: 2022-06-20 | Disposition: A | Discharge status: Home / Self Care | Payer: BC Managed Care – PPO | Source: Ambulatory Visit | Attending: Radiation Oncology | Admitting: Radiation Oncology

## 2022-06-20 VITALS — BP 107/77 | HR 66 | Temp 97.3°F | Resp 18

## 2022-06-20 DIAGNOSIS — E871 Hypo-osmolality and hyponatremia: Secondary | ICD-10-CM

## 2022-06-20 DIAGNOSIS — C20 Malignant neoplasm of rectum: Secondary | ICD-10-CM

## 2022-06-20 DIAGNOSIS — E860 Dehydration: Secondary | ICD-10-CM | POA: Diagnosis not present

## 2022-06-20 DIAGNOSIS — T451X5D Adverse effect of antineoplastic and immunosuppressive drugs, subsequent encounter: Secondary | ICD-10-CM | POA: Diagnosis not present

## 2022-06-20 DIAGNOSIS — Z95828 Presence of other vascular implants and grafts: Secondary | ICD-10-CM

## 2022-06-20 DIAGNOSIS — M109 Gout, unspecified: Secondary | ICD-10-CM | POA: Diagnosis not present

## 2022-06-20 DIAGNOSIS — L409 Psoriasis, unspecified: Secondary | ICD-10-CM | POA: Diagnosis not present

## 2022-06-20 DIAGNOSIS — D6481 Anemia due to antineoplastic chemotherapy: Secondary | ICD-10-CM | POA: Diagnosis not present

## 2022-06-20 DIAGNOSIS — I10 Essential (primary) hypertension: Secondary | ICD-10-CM | POA: Diagnosis not present

## 2022-06-20 DIAGNOSIS — D61810 Antineoplastic chemotherapy induced pancytopenia: Secondary | ICD-10-CM | POA: Diagnosis not present

## 2022-06-20 DIAGNOSIS — Z510 Encounter for antineoplastic radiation therapy: Secondary | ICD-10-CM | POA: Diagnosis not present

## 2022-06-20 DIAGNOSIS — R110 Nausea: Secondary | ICD-10-CM | POA: Diagnosis not present

## 2022-06-20 DIAGNOSIS — Z452 Encounter for adjustment and management of vascular access device: Secondary | ICD-10-CM | POA: Diagnosis not present

## 2022-06-20 DIAGNOSIS — Z923 Personal history of irradiation: Secondary | ICD-10-CM | POA: Diagnosis not present

## 2022-06-20 LAB — RAD ONC ARIA SESSION SUMMARY
Course Elapsed Days: 4
Plan Fractions Treated to Date: 5
Plan Prescribed Dose Per Fraction: 1.8 Gy
Plan Total Fractions Prescribed: 25
Plan Total Prescribed Dose: 45 Gy
Reference Point Dosage Given to Date: 9 Gy
Reference Point Session Dosage Given: 1.8 Gy
Session Number: 5

## 2022-06-20 MED ORDER — SODIUM CHLORIDE 0.9% FLUSH
10.0000 mL | Freq: Once | INTRAVENOUS | Status: AC
  Administered 2022-06-20: 10 mL via INTRAVENOUS

## 2022-06-20 MED ORDER — SONAFINE EX EMUL
1.0000 | Freq: Once | CUTANEOUS | Status: AC
  Administered 2022-06-20: 1 via TOPICAL

## 2022-06-20 MED ORDER — HEPARIN SOD (PORK) LOCK FLUSH 100 UNIT/ML IV SOLN
500.0000 [IU] | Freq: Once | INTRAVENOUS | Status: AC
  Administered 2022-06-20: 500 [IU] via INTRAVENOUS

## 2022-06-20 MED ORDER — SODIUM CHLORIDE 0.9 % IV SOLN: INTRAVENOUS | Status: AC

## 2022-06-20 NOTE — Patient Instructions (Signed)
Dehydration, Adult Dehydration is a condition in which there is not enough water or other fluids in the body. This happens when a person loses more fluids than he or she takes in. Important organs, such as the kidneys, brain, and heart, cannot function without a proper amount of fluids. Any loss of fluids from the body can lead to dehydration. Dehydration can be mild, moderate, or severe. It should be treated right away to prevent it from becoming severe. What are the causes? Dehydration may be caused by: Conditions that cause loss of water or other fluids, such as diarrhea, vomiting, or sweating or urinating a lot. Not drinking enough fluids, especially when you are ill or doing activities that require a lot of energy. Other illnesses and conditions, such as fever or infection. Certain medicines, such as medicines that remove excess fluid from the body (diuretics). Lack of safe drinking water. Not being able to get enough water and food. What increases the risk? The following factors may make you more likely to develop this condition: Having a long-term (chronic) illness that has not been treated properly, such as diabetes, heart disease, or kidney disease. Being 65 years of age or older. Having a disability. Living in a place that is high in altitude, where thinner, drier air causes more fluid loss. Doing exercises that put stress on your body for a long time (endurance sports). What are the signs or symptoms? Symptoms of dehydration depend on how severe it is. Mild or moderate dehydration Thirst. Dry lips or dry mouth. Dizziness or light-headedness, especially when standing up from a seated position. Muscle cramps. Dark urine. Urine may be the color of tea. Less urine or tears produced than usual. Headache. Severe dehydration Changes in skin. Your skin may be cold and clammy, blotchy, or pale. Your skin also may not return to normal after being lightly pinched and released. Little or  no tears, urine, or sweat. Changes in vital signs, such as rapid breathing and low blood pressure. Your pulse may be weak or may be faster than 100 beats a minute when you are sitting still. Other changes, such as: Feeling very thirsty. Sunken eyes. Cold hands and feet. Confusion. Being very tired (lethargic) or having trouble waking from sleep. Short-term weight loss. Loss of consciousness. How is this diagnosed? This condition is diagnosed based on your symptoms and a physical exam. You may have blood and urine tests to help confirm the diagnosis. How is this treated? Treatment for this condition depends on how severe it is. Treatment should be started right away. Do not wait until dehydration becomes severe. Severe dehydration is an emergency and needs to be treated in a hospital. Mild or moderate dehydration can be treated at home. You may be asked to: Drink more fluids. Drink an oral rehydration solution (ORS). This drink helps restore proper amounts of fluids and salts and minerals in the blood (electrolytes). Severe dehydration can be treated: With IV fluids. By correcting abnormal levels of electrolytes. This is often done by giving electrolytes through a tube that is passed through your nose and into your stomach (nasogastric tube, or NG tube). By treating the underlying cause of dehydration. Follow these instructions at home: Oral rehydration solution If told by your health care provider, drink an ORS: Make an ORS by following instructions on the package. Start by drinking small amounts, about  cup (120 mL) every 5-10 minutes. Slowly increase how much you drink until you have taken the amount recommended by your health   care provider. Eating and drinking        Drink enough clear fluid to keep your urine pale yellow. If you were told to drink an ORS, finish the ORS first and then start slowly drinking other clear fluids. Drink fluids such as: Water. Do not drink only  water. Doing that can lead to hyponatremia, which is having too little salt (sodium) in the body. Water from ice chips you suck on. Fruit juice that you have added water to (diluted fruit juice). Low-calorie sports drinks. Eat foods that contain a healthy balance of electrolytes, such as bananas, oranges, potatoes, tomatoes, and spinach. Do not drink alcohol. Avoid the following: Drinks that contain a lot of sugar. These include high-calorie sports drinks, fruit juice that is not diluted, and soda. Caffeine. Foods that are greasy or contain a lot of fat or sugar. General instructions Take over-the-counter and prescription medicines only as told by your health care provider. Do not take sodium tablets. Doing that can lead to having too much sodium in the body (hypernatremia). Return to your normal activities as told by your health care provider. Ask your health care provider what activities are safe for you. Keep all follow-up visits as told by your health care provider. This is important. Contact a health care provider if: You have muscle cramps, pain, or discomfort, such as: Pain in your abdomen and the pain gets worse or stays in one area (localizes). Stiff neck. You have a rash. You are more irritable than usual. You are sleepier or have a harder time waking than usual. You feel weak or dizzy. You feel very thirsty. Get help right away if you have: Any symptoms of severe dehydration. Symptoms of vomiting, such as: You cannot eat or drink without vomiting. Vomiting gets worse or does not go away. Vomit includes blood or green matter (bile). Symptoms that get worse with treatment. A fever. A severe headache. Problems with urination or bowel movements, such as: Diarrhea that gets worse or does not go away. Blood in your stool (feces). This may cause stool to look black and tarry. Not urinating, or urinating only a small amount of very dark urine, within 6-8 hours. Trouble  breathing. These symptoms may represent a serious problem that is an emergency. Do not wait to see if the symptoms will go away. Get medical help right away. Call your local emergency services (911 in the [place] Do not drive yourself to the hospital. Summary Dehydration is a condition in which there is not enough water or other fluids in the body. This happens when a person loses more fluids than he or she takes in. Treatment for this condition depends on how severe it is. Treatment should be started right away. Do not wait until dehydration becomes severe. Drink enough clear fluid to keep your urine pale yellow. If you were told to drink an oral rehydration solution (ORS), finish the ORS first and then start slowly drinking other clear fluids. Take over-the-counter and prescription medicines only as told by your health care provider. Get help right away if you have any symptoms of severe dehydration. This information is not intended to replace advice given to you by your health care provider. Make sure you discuss any questions you have with your health care provider. Document Revised: 03/12/2022 Document Reviewed: 06/16/2019 Elsevier Patient Education  2023 Else[employer] Inc[employer]  

## 2022-06-23 ENCOUNTER — Other Ambulatory Visit: Payer: Self-pay

## 2022-06-23 ENCOUNTER — Inpatient Hospital Stay: Payer: BC Managed Care – PPO

## 2022-06-23 ENCOUNTER — Ambulatory Visit
Admission: RE | Admit: 2022-06-23 | Discharge: 2022-06-23 | Disposition: A | Discharge status: Home / Self Care | Payer: BC Managed Care – PPO | Source: Ambulatory Visit | Attending: Radiation Oncology | Admitting: Radiation Oncology

## 2022-06-23 VITALS — BP 102/80 | HR 64 | Temp 98.3°F | Resp 20 | Ht 71.0 in | Wt 192.2 lb

## 2022-06-23 DIAGNOSIS — L409 Psoriasis, unspecified: Secondary | ICD-10-CM | POA: Diagnosis not present

## 2022-06-23 DIAGNOSIS — Z923 Personal history of irradiation: Secondary | ICD-10-CM | POA: Diagnosis not present

## 2022-06-23 DIAGNOSIS — Z510 Encounter for antineoplastic radiation therapy: Secondary | ICD-10-CM | POA: Diagnosis not present

## 2022-06-23 DIAGNOSIS — M109 Gout, unspecified: Secondary | ICD-10-CM | POA: Diagnosis not present

## 2022-06-23 DIAGNOSIS — D6481 Anemia due to antineoplastic chemotherapy: Secondary | ICD-10-CM | POA: Diagnosis not present

## 2022-06-23 DIAGNOSIS — R110 Nausea: Secondary | ICD-10-CM | POA: Diagnosis not present

## 2022-06-23 DIAGNOSIS — D61810 Antineoplastic chemotherapy induced pancytopenia: Secondary | ICD-10-CM | POA: Diagnosis not present

## 2022-06-23 DIAGNOSIS — Z452 Encounter for adjustment and management of vascular access device: Secondary | ICD-10-CM | POA: Diagnosis not present

## 2022-06-23 DIAGNOSIS — E860 Dehydration: Secondary | ICD-10-CM | POA: Diagnosis not present

## 2022-06-23 DIAGNOSIS — T451X5D Adverse effect of antineoplastic and immunosuppressive drugs, subsequent encounter: Secondary | ICD-10-CM | POA: Diagnosis not present

## 2022-06-23 DIAGNOSIS — C20 Malignant neoplasm of rectum: Secondary | ICD-10-CM

## 2022-06-23 DIAGNOSIS — E871 Hypo-osmolality and hyponatremia: Secondary | ICD-10-CM

## 2022-06-23 DIAGNOSIS — Z95828 Presence of other vascular implants and grafts: Secondary | ICD-10-CM

## 2022-06-23 DIAGNOSIS — I10 Essential (primary) hypertension: Secondary | ICD-10-CM | POA: Diagnosis not present

## 2022-06-23 LAB — RAD ONC ARIA SESSION SUMMARY
Course Elapsed Days: 7
Plan Fractions Treated to Date: 6
Plan Prescribed Dose Per Fraction: 1.8 Gy
Plan Total Fractions Prescribed: 25
Plan Total Prescribed Dose: 45 Gy
Reference Point Dosage Given to Date: 10.8 Gy
Reference Point Session Dosage Given: 1.8 Gy
Session Number: 6

## 2022-06-23 MED ORDER — HEPARIN SOD (PORK) LOCK FLUSH 100 UNIT/ML IV SOLN
500.0000 [IU] | Freq: Once | INTRAVENOUS | Status: AC
  Administered 2022-06-23: 500 [IU] via INTRAVENOUS

## 2022-06-23 MED ORDER — SODIUM CHLORIDE 0.9 % IV SOLN: INTRAVENOUS | Status: DC

## 2022-06-23 MED ORDER — SODIUM CHLORIDE 0.9% FLUSH
10.0000 mL | Freq: Once | INTRAVENOUS | Status: AC
  Administered 2022-06-23: 10 mL via INTRAVENOUS

## 2022-06-23 NOTE — Patient Instructions (Signed)
Rehydration, Adult Rehydration is the replacement of body fluids, salts, and minerals (electrolytes) that are lost during dehydration. Dehydration is when there is not enough water or other fluids in the body. This happens when you lose more fluids than you take in. Common causes of dehydration include: Not drinking enough fluids. This can occur when you are ill or doing activities that require a lot of energy, especially in hot weather. Conditions that cause loss of water or other fluids, such as diarrhea, vomiting, sweating, or urinating a lot. Other illnesses, such as fever or infection. Certain medicines, such as those that remove excess fluid from the body (diuretics). Symptoms of mild or moderate dehydration may include thirst, dry lips and mouth, and dizziness. Symptoms of severe dehydration may include increased heart rate, confusion, fainting, and not urinating. For severe dehydration, you may need to get fluids through an IV at the hospital. For mild or moderate dehydration, you can usually rehydrate at home by drinking certain fluids as told by your health care provider. What are the risks? Generally, rehydration is safe. However, taking in too much fluid (overhydration) can be a problem. This is rare. Overhydration can cause an electrolyte imbalance, kidney failure, or a decrease in salt (sodium) levels in the body. Supplies needed You will need an oral rehydration solution (ORS) if your health care provider tells you to use one. This is a drink to treat dehydration. It can be found in pharmacies and retail stores. How to rehydrate Fluids Follow instructions from your health care provider for rehydration. The kind of fluid and the amount you should drink depend on your condition. In general, you should choose drinks that you prefer. If told by your health care provider, drink an ORS. Make an ORS by following instructions on the package. Start by drinking small amounts, about  cup (120  mL) every 5-10 minutes. Slowly increase how much you drink until you have taken the amount recommended by your health care provider. Drink enough clear fluids to keep your urine pale yellow. If you were told to drink an ORS, finish it first, then start slowly drinking other clear fluids. Drink fluids such as: Water. This includes sparkling water and flavored water. Drinking only water can lead to having too little sodium in your body (hyponatremia). Follow the advice of your health care provider. Water from ice chips you suck on. Fruit juice with water you add to it (diluted). Sports drinks. Hot or cold herbal teas. Broth-based soups. Milk or milk products. Food Follow instructions from your health care provider about what to eat while you rehydrate. Your health care provider may recommend that you slowly begin eating regular foods in small amounts. Eat foods that contain a healthy balance of electrolytes, such as bananas, oranges, potatoes, tomatoes, and spinach. Avoid foods that are greasy or contain a lot of sugar. In some cases, you may get nutrition through a feeding tube that is passed through your nose and into your stomach (nasogastric tube, or NG tube). This may be done if you have uncontrolled vomiting or diarrhea. Beverages to avoid  Certain beverages may make dehydration worse. While you rehydrate, avoid drinking alcohol. How to tell if you are recovering from dehydration You may be recovering from dehydration if: You are urinating more often than before you started rehydrating. Your urine is pale yellow. Your energy level improves. You vomit less frequently. You have diarrhea less frequently. Your appetite improves or returns to normal. You feel less dizzy or less light-headed.   Your skin tone and color start to look more normal. Follow these instructions at home: Take over-the-counter and prescription medicines only as told by your health care provider. Do not take sodium  tablets. Doing this can lead to having too much sodium in your body (hypernatremia). Contact a health care provider if: You continue to have symptoms of mild or moderate dehydration, such as: Thirst. Dry lips. Slightly dry mouth. Dizziness. Dark urine or less urine than normal. Muscle cramps. You continue to vomit or have diarrhea. Get help right away if you: Have symptoms of dehydration that get worse. Have a fever. Have a severe headache. Have been vomiting and the following happens: Your vomiting gets worse or does not go away. Your vomit includes blood or green matter (bile). You cannot eat or drink without vomiting. Have problems with urination or bowel movements, such as: Diarrhea that gets worse or does not go away. Blood in your stool (feces). This may cause stool to look black and tarry. Not urinating, or urinating only a small amount of very dark urine, within 6-8 hours. Have trouble breathing. Have symptoms that get worse with treatment. These symptoms may represent a serious problem that is an emergency. Do not wait to see if the symptoms will go away. Get medical help right away. Call your local emergency services (911 in the [place] Do not drive yourself to the hospital. Summary Rehydration is the replacement of body fluids and minerals (electrolytes) that are lost during dehydration. Follow instructions from your health care provider for rehydration. The kind of fluid and amount you should drink depend on your condition. Slowly increase how much you drink until you have taken the amount recommended by your health care provider. Contact your health care provider if you continue to show signs of mild or moderate dehydration. This information is not intended to replace advice given to you by your health care provider. Make sure you discuss any questions you have with your health care provider. Document Revised: 01/04/2020 Document Reviewed: 11/14/2019 Elsevier Patient  Education  2023 [employer]  

## 2022-06-24 ENCOUNTER — Ambulatory Visit
Admission: RE | Admit: 2022-06-24 | Discharge: 2022-06-24 | Disposition: A | Discharge status: Home / Self Care | Payer: BC Managed Care – PPO | Source: Ambulatory Visit | Attending: Radiation Oncology | Admitting: Radiation Oncology

## 2022-06-24 ENCOUNTER — Other Ambulatory Visit: Payer: Self-pay

## 2022-06-24 ENCOUNTER — Other Ambulatory Visit (HOSPITAL_COMMUNITY): Payer: Self-pay

## 2022-06-24 DIAGNOSIS — Z510 Encounter for antineoplastic radiation therapy: Secondary | ICD-10-CM | POA: Diagnosis not present

## 2022-06-24 DIAGNOSIS — E860 Dehydration: Secondary | ICD-10-CM | POA: Diagnosis not present

## 2022-06-24 DIAGNOSIS — L409 Psoriasis, unspecified: Secondary | ICD-10-CM | POA: Diagnosis not present

## 2022-06-24 DIAGNOSIS — C20 Malignant neoplasm of rectum: Secondary | ICD-10-CM | POA: Diagnosis not present

## 2022-06-24 DIAGNOSIS — Z923 Personal history of irradiation: Secondary | ICD-10-CM | POA: Diagnosis not present

## 2022-06-24 DIAGNOSIS — D6481 Anemia due to antineoplastic chemotherapy: Secondary | ICD-10-CM | POA: Diagnosis not present

## 2022-06-24 DIAGNOSIS — D61810 Antineoplastic chemotherapy induced pancytopenia: Secondary | ICD-10-CM | POA: Diagnosis not present

## 2022-06-24 DIAGNOSIS — T451X5D Adverse effect of antineoplastic and immunosuppressive drugs, subsequent encounter: Secondary | ICD-10-CM | POA: Diagnosis not present

## 2022-06-24 DIAGNOSIS — R110 Nausea: Secondary | ICD-10-CM | POA: Diagnosis not present

## 2022-06-24 DIAGNOSIS — M109 Gout, unspecified: Secondary | ICD-10-CM | POA: Diagnosis not present

## 2022-06-24 DIAGNOSIS — I10 Essential (primary) hypertension: Secondary | ICD-10-CM | POA: Diagnosis not present

## 2022-06-24 DIAGNOSIS — Z452 Encounter for adjustment and management of vascular access device: Secondary | ICD-10-CM | POA: Diagnosis not present

## 2022-06-24 LAB — RAD ONC ARIA SESSION SUMMARY
Course Elapsed Days: 8
Plan Fractions Treated to Date: 7
Plan Prescribed Dose Per Fraction: 1.8 Gy
Plan Total Fractions Prescribed: 25
Plan Total Prescribed Dose: 45 Gy
Reference Point Dosage Given to Date: 12.6 Gy
Reference Point Session Dosage Given: 1.8 Gy
Session Number: 7

## 2022-06-25 ENCOUNTER — Other Ambulatory Visit: Payer: Self-pay

## 2022-06-25 ENCOUNTER — Inpatient Hospital Stay: Payer: BC Managed Care – PPO

## 2022-06-25 ENCOUNTER — Ambulatory Visit
Admission: RE | Admit: 2022-06-25 | Discharge: 2022-06-25 | Disposition: A | Discharge status: Home / Self Care | Payer: BC Managed Care – PPO | Source: Ambulatory Visit | Attending: Radiation Oncology | Admitting: Radiation Oncology

## 2022-06-25 VITALS — BP 111/73 | HR 66 | Temp 97.6°F | Resp 18

## 2022-06-25 DIAGNOSIS — E871 Hypo-osmolality and hyponatremia: Secondary | ICD-10-CM

## 2022-06-25 DIAGNOSIS — R110 Nausea: Secondary | ICD-10-CM | POA: Diagnosis not present

## 2022-06-25 DIAGNOSIS — Z452 Encounter for adjustment and management of vascular access device: Secondary | ICD-10-CM | POA: Diagnosis not present

## 2022-06-25 DIAGNOSIS — Z95828 Presence of other vascular implants and grafts: Secondary | ICD-10-CM

## 2022-06-25 DIAGNOSIS — M109 Gout, unspecified: Secondary | ICD-10-CM | POA: Diagnosis not present

## 2022-06-25 DIAGNOSIS — L409 Psoriasis, unspecified: Secondary | ICD-10-CM | POA: Diagnosis not present

## 2022-06-25 DIAGNOSIS — Z923 Personal history of irradiation: Secondary | ICD-10-CM | POA: Diagnosis not present

## 2022-06-25 DIAGNOSIS — C20 Malignant neoplasm of rectum: Secondary | ICD-10-CM

## 2022-06-25 DIAGNOSIS — E860 Dehydration: Secondary | ICD-10-CM | POA: Diagnosis not present

## 2022-06-25 DIAGNOSIS — T451X5D Adverse effect of antineoplastic and immunosuppressive drugs, subsequent encounter: Secondary | ICD-10-CM | POA: Diagnosis not present

## 2022-06-25 DIAGNOSIS — D6481 Anemia due to antineoplastic chemotherapy: Secondary | ICD-10-CM | POA: Diagnosis not present

## 2022-06-25 DIAGNOSIS — I10 Essential (primary) hypertension: Secondary | ICD-10-CM | POA: Diagnosis not present

## 2022-06-25 DIAGNOSIS — Z510 Encounter for antineoplastic radiation therapy: Secondary | ICD-10-CM | POA: Diagnosis not present

## 2022-06-25 DIAGNOSIS — D61810 Antineoplastic chemotherapy induced pancytopenia: Secondary | ICD-10-CM | POA: Diagnosis not present

## 2022-06-25 LAB — RAD ONC ARIA SESSION SUMMARY
Course Elapsed Days: 9
Plan Fractions Treated to Date: 8
Plan Prescribed Dose Per Fraction: 1.8 Gy
Plan Total Fractions Prescribed: 25
Plan Total Prescribed Dose: 45 Gy
Reference Point Dosage Given to Date: 14.4 Gy
Reference Point Session Dosage Given: 1.8 Gy
Session Number: 8

## 2022-06-25 MED ORDER — SODIUM CHLORIDE 0.9% FLUSH
10.0000 mL | Freq: Once | INTRAVENOUS | Status: AC
  Administered 2022-06-25: 10 mL via INTRAVENOUS

## 2022-06-25 MED ORDER — SODIUM CHLORIDE 0.9 % IV SOLN: INTRAVENOUS | Status: AC

## 2022-06-25 MED ORDER — HEPARIN SOD (PORK) LOCK FLUSH 100 UNIT/ML IV SOLN
500.0000 [IU] | Freq: Once | INTRAVENOUS | Status: AC
  Administered 2022-06-25: 500 [IU] via INTRAVENOUS

## 2022-06-25 NOTE — Patient Instructions (Signed)
Rehydration, Adult Rehydration is the replacement of body fluids, salts, and minerals (electrolytes) that are lost during dehydration. Dehydration is when there is not enough water or other fluids in the body. This happens when you lose more fluids than you take in. Common causes of dehydration include: Not drinking enough fluids. This can occur when you are ill or doing activities that require a lot of energy, especially in hot weather. Conditions that cause loss of water or other fluids, such as diarrhea, vomiting, sweating, or urinating a lot. Other illnesses, such as fever or infection. Certain medicines, such as those that remove excess fluid from the body (diuretics). Symptoms of mild or moderate dehydration may include thirst, dry lips and mouth, and dizziness. Symptoms of severe dehydration may include increased heart rate, confusion, fainting, and not urinating. For severe dehydration, you may need to get fluids through an IV at the hospital. For mild or moderate dehydration, you can usually rehydrate at home by drinking certain fluids as told by your health care provider. What are the risks? Generally, rehydration is safe. However, taking in too much fluid (overhydration) can be a problem. This is rare. Overhydration can cause an electrolyte imbalance, kidney failure, or a decrease in salt (sodium) levels in the body. Supplies needed You will need an oral rehydration solution (ORS) if your health care provider tells you to use one. This is a drink to treat dehydration. It can be found in pharmacies and retail stores. How to rehydrate Fluids Follow instructions from your health care provider for rehydration. The kind of fluid and the amount you should drink depend on your condition. In general, you should choose drinks that you prefer. If told by your health care provider, drink an ORS. Make an ORS by following instructions on the package. Start by drinking small amounts, about  cup (120  mL) every 5-10 minutes. Slowly increase how much you drink until you have taken the amount recommended by your health care provider. Drink enough clear fluids to keep your urine pale yellow. If you were told to drink an ORS, finish it first, then start slowly drinking other clear fluids. Drink fluids such as: Water. This includes sparkling water and flavored water. Drinking only water can lead to having too little sodium in your body (hyponatremia). Follow the advice of your health care provider. Water from ice chips you suck on. Fruit juice with water you add to it (diluted). Sports drinks. Hot or cold herbal teas. Broth-based soups. Milk or milk products. Food Follow instructions from your health care provider about what to eat while you rehydrate. Your health care provider may recommend that you slowly begin eating regular foods in small amounts. Eat foods that contain a healthy balance of electrolytes, such as bananas, oranges, potatoes, tomatoes, and spinach. Avoid foods that are greasy or contain a lot of sugar. In some cases, you may get nutrition through a feeding tube that is passed through your nose and into your stomach (nasogastric tube, or NG tube). This may be done if you have uncontrolled vomiting or diarrhea. Beverages to avoid  Certain beverages may make dehydration worse. While you rehydrate, avoid drinking alcohol. How to tell if you are recovering from dehydration You may be recovering from dehydration if: You are urinating more often than before you started rehydrating. Your urine is pale yellow. Your energy level improves. You vomit less frequently. You have diarrhea less frequently. Your appetite improves or returns to normal. You feel less dizzy or less light-headed.   Your skin tone and color start to look more normal. Follow these instructions at home: Take over-the-counter and prescription medicines only as told by your health care provider. Do not take sodium  tablets. Doing this can lead to having too much sodium in your body (hypernatremia). Contact a health care provider if: You continue to have symptoms of mild or moderate dehydration, such as: Thirst. Dry lips. Slightly dry mouth. Dizziness. Dark urine or less urine than normal. Muscle cramps. You continue to vomit or have diarrhea. Get help right away if you: Have symptoms of dehydration that get worse. Have a fever. Have a severe headache. Have been vomiting and the following happens: Your vomiting gets worse or does not go away. Your vomit includes blood or green matter (bile). You cannot eat or drink without vomiting. Have problems with urination or bowel movements, such as: Diarrhea that gets worse or does not go away. Blood in your stool (feces). This may cause stool to look black and tarry. Not urinating, or urinating only a small amount of very dark urine, within 6-8 hours. Have trouble breathing. Have symptoms that get worse with treatment. These symptoms may represent a serious problem that is an emergency. Do not wait to see if the symptoms will go away. Get medical help right away. Call your local emergency services (911 in the [place] Do not drive yourself to the hospital. Summary Rehydration is the replacement of body fluids and minerals (electrolytes) that are lost during dehydration. Follow instructions from your health care provider for rehydration. The kind of fluid and amount you should drink depend on your condition. Slowly increase how much you drink until you have taken the amount recommended by your health care provider. Contact your health care provider if you continue to show signs of mild or moderate dehydration. This information is not intended to replace advice given to you by your health care provider. Make sure you discuss any questions you have with your health care provider. Document Revised: 01/04/2020 Document Reviewed: 11/14/2019 Elsevier Patient  Education  2023 [employer]  

## 2022-06-26 ENCOUNTER — Ambulatory Visit
Admission: RE | Admit: 2022-06-26 | Discharge: 2022-06-26 | Disposition: A | Discharge status: Home / Self Care | Payer: BC Managed Care – PPO | Source: Ambulatory Visit | Attending: Radiation Oncology | Admitting: Radiation Oncology

## 2022-06-26 ENCOUNTER — Other Ambulatory Visit: Payer: Self-pay

## 2022-06-26 DIAGNOSIS — T451X5D Adverse effect of antineoplastic and immunosuppressive drugs, subsequent encounter: Secondary | ICD-10-CM | POA: Diagnosis not present

## 2022-06-26 DIAGNOSIS — Z510 Encounter for antineoplastic radiation therapy: Secondary | ICD-10-CM | POA: Diagnosis not present

## 2022-06-26 DIAGNOSIS — C20 Malignant neoplasm of rectum: Secondary | ICD-10-CM | POA: Diagnosis not present

## 2022-06-26 DIAGNOSIS — L409 Psoriasis, unspecified: Secondary | ICD-10-CM | POA: Diagnosis not present

## 2022-06-26 DIAGNOSIS — I10 Essential (primary) hypertension: Secondary | ICD-10-CM | POA: Diagnosis not present

## 2022-06-26 DIAGNOSIS — D6481 Anemia due to antineoplastic chemotherapy: Secondary | ICD-10-CM | POA: Diagnosis not present

## 2022-06-26 DIAGNOSIS — Z923 Personal history of irradiation: Secondary | ICD-10-CM | POA: Diagnosis not present

## 2022-06-26 DIAGNOSIS — Z452 Encounter for adjustment and management of vascular access device: Secondary | ICD-10-CM | POA: Diagnosis not present

## 2022-06-26 DIAGNOSIS — D61810 Antineoplastic chemotherapy induced pancytopenia: Secondary | ICD-10-CM | POA: Diagnosis not present

## 2022-06-26 DIAGNOSIS — M109 Gout, unspecified: Secondary | ICD-10-CM | POA: Diagnosis not present

## 2022-06-26 DIAGNOSIS — R110 Nausea: Secondary | ICD-10-CM | POA: Diagnosis not present

## 2022-06-26 DIAGNOSIS — E860 Dehydration: Secondary | ICD-10-CM | POA: Diagnosis not present

## 2022-06-26 LAB — RAD ONC ARIA SESSION SUMMARY
Course Elapsed Days: 10
Plan Fractions Treated to Date: 9
Plan Prescribed Dose Per Fraction: 1.8 Gy
Plan Total Fractions Prescribed: 25
Plan Total Prescribed Dose: 45 Gy
Reference Point Dosage Given to Date: 16.2 Gy
Reference Point Session Dosage Given: 1.8 Gy
Session Number: 9

## 2022-06-27 ENCOUNTER — Ambulatory Visit
Admission: RE | Admit: 2022-06-27 | Discharge: 2022-06-27 | Disposition: A | Discharge status: Home / Self Care | Payer: BC Managed Care – PPO | Source: Ambulatory Visit | Attending: Radiation Oncology | Admitting: Radiation Oncology

## 2022-06-27 ENCOUNTER — Other Ambulatory Visit: Payer: Self-pay

## 2022-06-27 ENCOUNTER — Inpatient Hospital Stay: Payer: BC Managed Care – PPO

## 2022-06-27 ENCOUNTER — Other Ambulatory Visit: Payer: Self-pay | Admitting: *Deleted

## 2022-06-27 VITALS — BP 107/77 | HR 62 | Temp 97.7°F

## 2022-06-27 DIAGNOSIS — E860 Dehydration: Secondary | ICD-10-CM | POA: Diagnosis not present

## 2022-06-27 DIAGNOSIS — Z923 Personal history of irradiation: Secondary | ICD-10-CM | POA: Diagnosis not present

## 2022-06-27 DIAGNOSIS — D61810 Antineoplastic chemotherapy induced pancytopenia: Secondary | ICD-10-CM | POA: Diagnosis not present

## 2022-06-27 DIAGNOSIS — E871 Hypo-osmolality and hyponatremia: Secondary | ICD-10-CM

## 2022-06-27 DIAGNOSIS — D6481 Anemia due to antineoplastic chemotherapy: Secondary | ICD-10-CM | POA: Diagnosis not present

## 2022-06-27 DIAGNOSIS — Z95828 Presence of other vascular implants and grafts: Secondary | ICD-10-CM

## 2022-06-27 DIAGNOSIS — L409 Psoriasis, unspecified: Secondary | ICD-10-CM | POA: Diagnosis not present

## 2022-06-27 DIAGNOSIS — R110 Nausea: Secondary | ICD-10-CM | POA: Diagnosis not present

## 2022-06-27 DIAGNOSIS — Z510 Encounter for antineoplastic radiation therapy: Secondary | ICD-10-CM | POA: Diagnosis not present

## 2022-06-27 DIAGNOSIS — M109 Gout, unspecified: Secondary | ICD-10-CM | POA: Diagnosis not present

## 2022-06-27 DIAGNOSIS — T451X5D Adverse effect of antineoplastic and immunosuppressive drugs, subsequent encounter: Secondary | ICD-10-CM | POA: Diagnosis not present

## 2022-06-27 DIAGNOSIS — C20 Malignant neoplasm of rectum: Secondary | ICD-10-CM | POA: Diagnosis not present

## 2022-06-27 DIAGNOSIS — I10 Essential (primary) hypertension: Secondary | ICD-10-CM | POA: Diagnosis not present

## 2022-06-27 DIAGNOSIS — Z452 Encounter for adjustment and management of vascular access device: Secondary | ICD-10-CM | POA: Diagnosis not present

## 2022-06-27 LAB — RAD ONC ARIA SESSION SUMMARY
Course Elapsed Days: 11
Plan Fractions Treated to Date: 10
Plan Prescribed Dose Per Fraction: 1.8 Gy
Plan Total Fractions Prescribed: 25
Plan Total Prescribed Dose: 45 Gy
Reference Point Dosage Given to Date: 18 Gy
Reference Point Session Dosage Given: 1.8 Gy
Session Number: 10

## 2022-06-27 MED ORDER — SODIUM CHLORIDE 0.9% FLUSH
10.0000 mL | Freq: Once | INTRAVENOUS | Status: AC
  Administered 2022-06-27: 10 mL via INTRAVENOUS

## 2022-06-27 MED ORDER — SODIUM CHLORIDE 0.9 % IV SOLN: INTRAVENOUS | Status: DC

## 2022-06-27 MED ORDER — HEPARIN SOD (PORK) LOCK FLUSH 100 UNIT/ML IV SOLN
500.0000 [IU] | Freq: Once | INTRAVENOUS | Status: AC
  Administered 2022-06-27: 500 [IU] via INTRAVENOUS

## 2022-06-27 NOTE — Patient Instructions (Signed)
Rehydration, Adult Rehydration is the replacement of body fluids, salts, and minerals (electrolytes) that are lost during dehydration. Dehydration is when there is not enough water or other fluids in the body. This happens when you lose more fluids than you take in. Common causes of dehydration include: Not drinking enough fluids. This can occur when you are ill or doing activities that require a lot of energy, especially in hot weather. Conditions that cause loss of water or other fluids, such as diarrhea, vomiting, sweating, or urinating a lot. Other illnesses, such as fever or infection. Certain medicines, such as those that remove excess fluid from the body (diuretics). Symptoms of mild or moderate dehydration may include thirst, dry lips and mouth, and dizziness. Symptoms of severe dehydration may include increased heart rate, confusion, fainting, and not urinating. For severe dehydration, you may need to get fluids through an IV at the hospital. For mild or moderate dehydration, you can usually rehydrate at home by drinking certain fluids as told by your health care provider. What are the risks? Generally, rehydration is safe. However, taking in too much fluid (overhydration) can be a problem. This is rare. Overhydration can cause an electrolyte imbalance, kidney failure, or a decrease in salt (sodium) levels in the body. Supplies needed You will need an oral rehydration solution (ORS) if your health care provider tells you to use one. This is a drink to treat dehydration. It can be found in pharmacies and retail stores. How to rehydrate Fluids Follow instructions from your health care provider for rehydration. The kind of fluid and the amount you should drink depend on your condition. In general, you should choose drinks that you prefer. If told by your health care provider, drink an ORS. Make an ORS by following instructions on the package. Start by drinking small amounts, about  cup (120  mL) every 5-10 minutes. Slowly increase how much you drink until you have taken the amount recommended by your health care provider. Drink enough clear fluids to keep your urine pale yellow. If you were told to drink an ORS, finish it first, then start slowly drinking other clear fluids. Drink fluids such as: Water. This includes sparkling water and flavored water. Drinking only water can lead to having too little sodium in your body (hyponatremia). Follow the advice of your health care provider. Water from ice chips you suck on. Fruit juice with water you add to it (diluted). Sports drinks. Hot or cold herbal teas. Broth-based soups. Milk or milk products. Food Follow instructions from your health care provider about what to eat while you rehydrate. Your health care provider may recommend that you slowly begin eating regular foods in small amounts. Eat foods that contain a healthy balance of electrolytes, such as bananas, oranges, potatoes, tomatoes, and spinach. Avoid foods that are greasy or contain a lot of sugar. In some cases, you may get nutrition through a feeding tube that is passed through your nose and into your stomach (nasogastric tube, or NG tube). This may be done if you have uncontrolled vomiting or diarrhea. Beverages to avoid  Certain beverages may make dehydration worse. While you rehydrate, avoid drinking alcohol. How to tell if you are recovering from dehydration You may be recovering from dehydration if: You are urinating more often than before you started rehydrating. Your urine is pale yellow. Your energy level improves. You vomit less frequently. You have diarrhea less frequently. Your appetite improves or returns to normal. You feel less dizzy or less light-headed.   Your skin tone and color start to look more normal. Follow these instructions at home: Take over-the-counter and prescription medicines only as told by your health care provider. Do not take sodium  tablets. Doing this can lead to having too much sodium in your body (hypernatremia). Contact a health care provider if: You continue to have symptoms of mild or moderate dehydration, such as: Thirst. Dry lips. Slightly dry mouth. Dizziness. Dark urine or less urine than normal. Muscle cramps. You continue to vomit or have diarrhea. Get help right away if you: Have symptoms of dehydration that get worse. Have a fever. Have a severe headache. Have been vomiting and the following happens: Your vomiting gets worse or does not go away. Your vomit includes blood or green matter (bile). You cannot eat or drink without vomiting. Have problems with urination or bowel movements, such as: Diarrhea that gets worse or does not go away. Blood in your stool (feces). This may cause stool to look black and tarry. Not urinating, or urinating only a small amount of very dark urine, within 6-8 hours. Have trouble breathing. Have symptoms that get worse with treatment. These symptoms may represent a serious problem that is an emergency. Do not wait to see if the symptoms will go away. Get medical help right away. Call your local emergency services (911 in the [place] Do not drive yourself to the hospital. Summary Rehydration is the replacement of body fluids and minerals (electrolytes) that are lost during dehydration. Follow instructions from your health care provider for rehydration. The kind of fluid and amount you should drink depend on your condition. Slowly increase how much you drink until you have taken the amount recommended by your health care provider. Contact your health care provider if you continue to show signs of mild or moderate dehydration. This information is not intended to replace advice given to you by your health care provider. Make sure you discuss any questions you have with your health care provider. Document Revised: 01/04/2020 Document Reviewed: 11/14/2019 Elsevier Patient  Education  2023 [employer]  

## 2022-06-30 ENCOUNTER — Other Ambulatory Visit (HOSPITAL_BASED_OUTPATIENT_CLINIC_OR_DEPARTMENT_OTHER): Payer: Self-pay

## 2022-06-30 ENCOUNTER — Ambulatory Visit
Admission: RE | Admit: 2022-06-30 | Discharge: 2022-06-30 | Disposition: A | Discharge status: Home / Self Care | Payer: BC Managed Care – PPO | Source: Ambulatory Visit | Attending: Radiation Oncology | Admitting: Radiation Oncology

## 2022-06-30 ENCOUNTER — Inpatient Hospital Stay: Payer: BC Managed Care – PPO

## 2022-06-30 ENCOUNTER — Other Ambulatory Visit: Payer: Self-pay

## 2022-06-30 ENCOUNTER — Encounter (HOSPITAL_BASED_OUTPATIENT_CLINIC_OR_DEPARTMENT_OTHER): Payer: Self-pay | Admitting: Pharmacist

## 2022-06-30 ENCOUNTER — Inpatient Hospital Stay: Payer: BC Managed Care – PPO | Admitting: Oncology

## 2022-06-30 ENCOUNTER — Other Ambulatory Visit (HOSPITAL_COMMUNITY): Payer: Self-pay

## 2022-06-30 VITALS — BP 121/77 | HR 75 | Temp 98.1°F | Resp 18 | Ht 71.0 in | Wt 196.0 lb

## 2022-06-30 VITALS — BP 124/86 | HR 69

## 2022-06-30 DIAGNOSIS — C20 Malignant neoplasm of rectum: Secondary | ICD-10-CM | POA: Diagnosis not present

## 2022-06-30 DIAGNOSIS — Z923 Personal history of irradiation: Secondary | ICD-10-CM | POA: Diagnosis not present

## 2022-06-30 DIAGNOSIS — M109 Gout, unspecified: Secondary | ICD-10-CM | POA: Diagnosis not present

## 2022-06-30 DIAGNOSIS — T451X5D Adverse effect of antineoplastic and immunosuppressive drugs, subsequent encounter: Secondary | ICD-10-CM | POA: Diagnosis not present

## 2022-06-30 DIAGNOSIS — D61810 Antineoplastic chemotherapy induced pancytopenia: Secondary | ICD-10-CM | POA: Diagnosis not present

## 2022-06-30 DIAGNOSIS — R110 Nausea: Secondary | ICD-10-CM | POA: Diagnosis not present

## 2022-06-30 DIAGNOSIS — L409 Psoriasis, unspecified: Secondary | ICD-10-CM | POA: Diagnosis not present

## 2022-06-30 DIAGNOSIS — E860 Dehydration: Secondary | ICD-10-CM | POA: Diagnosis not present

## 2022-06-30 DIAGNOSIS — I10 Essential (primary) hypertension: Secondary | ICD-10-CM | POA: Diagnosis not present

## 2022-06-30 DIAGNOSIS — Z452 Encounter for adjustment and management of vascular access device: Secondary | ICD-10-CM | POA: Diagnosis not present

## 2022-06-30 DIAGNOSIS — D6481 Anemia due to antineoplastic chemotherapy: Secondary | ICD-10-CM | POA: Diagnosis not present

## 2022-06-30 DIAGNOSIS — E871 Hypo-osmolality and hyponatremia: Secondary | ICD-10-CM

## 2022-06-30 DIAGNOSIS — Z510 Encounter for antineoplastic radiation therapy: Secondary | ICD-10-CM | POA: Diagnosis not present

## 2022-06-30 LAB — RAD ONC ARIA SESSION SUMMARY
Course Elapsed Days: 14
Plan Fractions Treated to Date: 11
Plan Prescribed Dose Per Fraction: 1.8 Gy
Plan Total Fractions Prescribed: 25
Plan Total Prescribed Dose: 45 Gy
Reference Point Dosage Given to Date: 19.8 Gy
Reference Point Session Dosage Given: 1.8 Gy
Session Number: 11

## 2022-06-30 LAB — CBC WITH DIFFERENTIAL (CANCER CENTER ONLY)
Abs Immature Granulocytes: 0.01 10*3/uL (ref 0.00–0.07)
Basophils Absolute: 0 10*3/uL (ref 0.0–0.1)
Basophils Relative: 0 %
Eosinophils Absolute: 0.2 10*3/uL (ref 0.0–0.5)
Eosinophils Relative: 5 %
HCT: 28.4 % — ABNORMAL LOW (ref 39.0–52.0)
Hemoglobin: 9.5 g/dL — ABNORMAL LOW (ref 13.0–17.0)
Immature Granulocytes: 0 %
Lymphocytes Relative: 12 %
Lymphs Abs: 0.5 10*3/uL — ABNORMAL LOW (ref 0.7–4.0)
MCH: 33.9 pg (ref 26.0–34.0)
MCHC: 33.5 g/dL (ref 30.0–36.0)
MCV: 101.4 fL — ABNORMAL HIGH (ref 80.0–100.0)
Monocytes Absolute: 0.3 10*3/uL (ref 0.1–1.0)
Monocytes Relative: 6 %
Neutro Abs: 3.1 10*3/uL (ref 1.7–7.7)
Neutrophils Relative %: 77 %
Platelet Count: 73 10*3/uL — ABNORMAL LOW (ref 150–400)
RBC: 2.8 MIL/uL — ABNORMAL LOW (ref 4.22–5.81)
RDW: 14.5 % (ref 11.5–15.5)
WBC Count: 4 10*3/uL (ref 4.0–10.5)
nRBC: 0 % (ref 0.0–0.2)

## 2022-06-30 LAB — CMP (CANCER CENTER ONLY)
ALT: 19 U/L (ref 0–44)
AST: 27 U/L (ref 15–41)
Albumin: 3.8 g/dL (ref 3.5–5.0)
Alkaline Phosphatase: 167 U/L — ABNORMAL HIGH (ref 38–126)
Anion gap: 6 (ref 5–15)
BUN: 11 mg/dL (ref 6–20)
CO2: 27 mmol/L (ref 22–32)
Calcium: 8.8 mg/dL — ABNORMAL LOW (ref 8.9–10.3)
Chloride: 104 mmol/L (ref 98–111)
Creatinine: 1.02 mg/dL (ref 0.61–1.24)
GFR, Estimated: >60 mL/min (ref >60)
Glucose, Bld: 127 mg/dL — ABNORMAL HIGH (ref 70–99)
Potassium: 3.7 mmol/L (ref 3.5–5.1)
Sodium: 137 mmol/L (ref 135–145)
Total Bilirubin: 0.8 mg/dL (ref 0.3–1.2)
Total Protein: 6.2 g/dL — ABNORMAL LOW (ref 6.5–8.1)

## 2022-06-30 MED ORDER — SODIUM CHLORIDE 0.9% FLUSH
10.0000 mL | Freq: Once | INTRAVENOUS | Status: AC
  Administered 2022-06-30: 10 mL via INTRAVENOUS

## 2022-06-30 MED ORDER — SODIUM CHLORIDE 0.9 % IV SOLN: INTRAVENOUS | Status: DC

## 2022-06-30 MED ORDER — HEPARIN SOD (PORK) LOCK FLUSH 100 UNIT/ML IV SOLN
500.0000 [IU] | Freq: Once | INTRAVENOUS | Status: AC
  Administered 2022-06-30: 500 [IU] via INTRAVENOUS

## 2022-06-30 MED ORDER — DIPHENOXYLATE-ATROPINE 2.5-0.025 MG PO TABS
ORAL_TABLET | ORAL | 2 refills | Status: DC
  Filled 2022-06-30: qty 240, 30d supply, fill #0
  Filled 2022-08-07: qty 240, 30d supply, fill #1
  Filled 2022-09-21: qty 240, 30d supply, fill #2

## 2022-06-30 NOTE — Patient Instructions (Signed)
Rehydration, Adult Rehydration is the replacement of body fluids, salts, and minerals (electrolytes) that are lost during dehydration. Dehydration is when there is not enough water or other fluids in the body. This happens when you lose more fluids than you take in. Common causes of dehydration include: Not drinking enough fluids. This can occur when you are ill or doing activities that require a lot of energy, especially in hot weather. Conditions that cause loss of water or other fluids, such as diarrhea, vomiting, sweating, or urinating a lot. Other illnesses, such as fever or infection. Certain medicines, such as those that remove excess fluid from the body (diuretics). Symptoms of mild or moderate dehydration may include thirst, dry lips and mouth, and dizziness. Symptoms of severe dehydration may include increased heart rate, confusion, fainting, and not urinating. For severe dehydration, you may need to get fluids through an IV at the hospital. For mild or moderate dehydration, you can usually rehydrate at home by drinking certain fluids as told by your health care provider. What are the risks? Generally, rehydration is safe. However, taking in too much fluid (overhydration) can be a problem. This is rare. Overhydration can cause an electrolyte imbalance, kidney failure, or a decrease in salt (sodium) levels in the body. Supplies needed You will need an oral rehydration solution (ORS) if your health care provider tells you to use one. This is a drink to treat dehydration. It can be found in pharmacies and retail stores. How to rehydrate Fluids Follow instructions from your health care provider for rehydration. The kind of fluid and the amount you should drink depend on your condition. In general, you should choose drinks that you prefer. If told by your health care provider, drink an ORS. Make an ORS by following instructions on the package. Start by drinking small amounts, about  cup (120  mL) every 5-10 minutes. Slowly increase how much you drink until you have taken the amount recommended by your health care provider. Drink enough clear fluids to keep your urine pale yellow. If you were told to drink an ORS, finish it first, then start slowly drinking other clear fluids. Drink fluids such as: Water. This includes sparkling water and flavored water. Drinking only water can lead to having too little sodium in your body (hyponatremia). Follow the advice of your health care provider. Water from ice chips you suck on. Fruit juice with water you add to it (diluted). Sports drinks. Hot or cold herbal teas. Broth-based soups. Milk or milk products. Food Follow instructions from your health care provider about what to eat while you rehydrate. Your health care provider may recommend that you slowly begin eating regular foods in small amounts. Eat foods that contain a healthy balance of electrolytes, such as bananas, oranges, potatoes, tomatoes, and spinach. Avoid foods that are greasy or contain a lot of sugar. In some cases, you may get nutrition through a feeding tube that is passed through your nose and into your stomach (nasogastric tube, or NG tube). This may be done if you have uncontrolled vomiting or diarrhea. Beverages to avoid  Certain beverages may make dehydration worse. While you rehydrate, avoid drinking alcohol. How to tell if you are recovering from dehydration You may be recovering from dehydration if: You are urinating more often than before you started rehydrating. Your urine is pale yellow. Your energy level improves. You vomit less frequently. You have diarrhea less frequently. Your appetite improves or returns to normal. You feel less dizzy or less light-headed.   Your skin tone and color start to look more normal. Follow these instructions at home: Take over-the-counter and prescription medicines only as told by your health care provider. Do not take sodium  tablets. Doing this can lead to having too much sodium in your body (hypernatremia). Contact a health care provider if: You continue to have symptoms of mild or moderate dehydration, such as: Thirst. Dry lips. Slightly dry mouth. Dizziness. Dark urine or less urine than normal. Muscle cramps. You continue to vomit or have diarrhea. Get help right away if you: Have symptoms of dehydration that get worse. Have a fever. Have a severe headache. Have been vomiting and the following happens: Your vomiting gets worse or does not go away. Your vomit includes blood or green matter (bile). You cannot eat or drink without vomiting. Have problems with urination or bowel movements, such as: Diarrhea that gets worse or does not go away. Blood in your stool (feces). This may cause stool to look black and tarry. Not urinating, or urinating only a small amount of very dark urine, within 6-8 hours. Have trouble breathing. Have symptoms that get worse with treatment. These symptoms may represent a serious problem that is an emergency. Do not wait to see if the symptoms will go away. Get medical help right away. Call your local emergency services (911 in the [place] Do not drive yourself to the hospital. Summary Rehydration is the replacement of body fluids and minerals (electrolytes) that are lost during dehydration. Follow instructions from your health care provider for rehydration. The kind of fluid and amount you should drink depend on your condition. Slowly increase how much you drink until you have taken the amount recommended by your health care provider. Contact your health care provider if you continue to show signs of mild or moderate dehydration. This information is not intended to replace advice given to you by your health care provider. Make sure you discuss any questions you have with your health care provider. Document Revised: 01/04/2020 Document Reviewed: 11/14/2019 Elsevier Patient  Education  2023 [employer]  

## 2022-06-30 NOTE — Patient Instructions (Signed)

## 2022-06-30 NOTE — Progress Notes (Signed)
[facility] OFFICE PROGRESS NOTE   Diagnosis: Rectal cancer  INTERVAL HISTORY:   Kenneth [name] returns as scheduled.  He began concurrent capecitabine and radiation 06/16/2022.  No mouth sores, hand/foot pain, or increase in stool output.  He reports an improved appetite since completing FOLFOX.  He has mild numbness in the left second finger.  No other numbness.  He has developed rectal discharge.  He has noted increased soreness at the area of previous pilonidal cyst surgery.  Mr. Ganaway saw Dr. Morton Stall in July.  He is scheduled for restaging MRI and office visit next month.  Objective:  Vital signs in last 24 hours:  Blood pressure 121/77, pulse 75, temperature 98.1 F (36.7 C), temperature source Oral, resp. rate 18, height '5\' 11"'  (1.803 m), weight 196 lb (88.9 kg), SpO2 100 %.    HEENT: No thrush or ulcers Resp: Lungs clear bilaterally Cardio: Regular rate and rhythm GI: No hepatosplenomegaly, right abdomen ileostomy Vascular: No leg edema  Skin: Palms without erythema, dryness and superficial flaking at the soles, mild erythema at the pilonidal cyst surgical site.  No fluctuance or drainage.  Portacath/PICC-without erythema  Lab Results:  Lab Results  Component Value Date   WBC 4.0 06/30/2022   HGB 9.5 (L) 06/30/2022   HCT 28.4 (L) 06/30/2022   MCV 101.4 (H) 06/30/2022   PLT 73 (L) 06/30/2022   NEUTROABS 3.1 06/30/2022    CMP  Lab Results  Component Value Date   NA 137 06/30/2022   K 3.7 06/30/2022   CL 104 06/30/2022   CO2 27 06/30/2022   GLUCOSE 127 (H) 06/30/2022   BUN 11 06/30/2022   CREATININE 1.02 06/30/2022   CALCIUM 8.8 (L) 06/30/2022   PROT 6.2 (L) 06/30/2022   ALBUMIN 3.8 06/30/2022   AST 27 06/30/2022   ALT 19 06/30/2022   ALKPHOS 167 (H) 06/30/2022   BILITOT 0.8 06/30/2022   GFRNONAA >60 06/30/2022   GFRAA 84 03/17/2008    Lab Results  Component Value Date   CEA 5.43 (H) 02/10/2022     Medications: I have reviewed the  patient's current medications.   Assessment/Plan:  Rectal cancer-mass at 11 cm on proctoscopy by Dr. Morton Stall 02/04/2022 Colonoscopy 12/26/2021-obstructing mass at the rectosigmoid measured at 15 cm, biopsy invasive moderate to poorly differentiated adenocarcinoma, mismatch repair protein expression intact CTs 12/31/2021-masslike circumferential thickening of the distal sigmoid colon with mildly enlarged pericolonic lymph nodes, numerous subcentimeter hypodense liver lesions, splenomegaly MRI abdomen 01/07/2022-innumerable T2 hyperintense liver lesions consistent with cysts, spleen unremarkable, no ascites or adenopathy, moderate colonic fecal retention, no bowel obstruction MRI pelvis 01/22/2022-T3cN2 tumor above and below the peritoneal reflection, tumor 11.3 cm from the anal verge and 6.1 cm from the sphincter complex, multiple enlarged perirectal nodes, 1.1 cm node versus tumor deposit at the right aspect of the tumor Diverting loop ileostomy 01/15/2022 Cycle 1 FOLFOX 02/17/2022 Cycle 2 FOLFOX 03/04/2022 Cycle 3 FOLFOX held on 03/17/2022 due to neutropenia Cycle 3 FOLFOX 03/17/2022, Udenyca Treatment held 03/31/2022 due to dehydration Cycle 4 FOLFOX 04/07/2022 Cycle 5 FOLFOX 04/22/2022, oxaliplatin dose reduced due to thrombocytopenia Cycle 6 FOLFOX 05/05/2022 Cycle 7 FOLFOX 05/19/2022 Cycle 8 FOLFOX held secondary to patient preference and toxicity (diarrhea/weight loss/neuropathy symptoms) 06/16/2022 radiation/Xeloda Gout Psoriasis Hypertension Left scalene node versus cyst/lipoma on exam 02/03/2022-CT neck 02/10/2022 with 9.5 mm lymph node in the left supraclavicular region posterior to the sternocleidomastoid muscle.  No other prominent lymph nodes in the neck.  Biopsy 02/12/2022 compatible with benign lymph  node, negative for metastatic carcinoma.   Disposition: Mr. Brickey is completing the course of neoadjuvant capecitabine and radiation.  He is tolerating the treatment well.  The BUN and creatinine are  normal today.  He has gained weight.  He will receive IV fluids today and will then be followed off of IV fluids.  He will contact us for symptoms of dehydration.  He has developed anemia and thrombocytopenia, likely secondary to toxicity from chemotherapy and radiation.  He will return for a lab visit in 1 week.  He was scheduled for an office visit on 07/16/2022.  I recommended he keep pressure off of the pilonidal cyst area.  Betsy Coder, MD  06/30/2022  1:02 PM

## 2022-07-01 ENCOUNTER — Other Ambulatory Visit: Payer: Self-pay

## 2022-07-01 ENCOUNTER — Other Ambulatory Visit (HOSPITAL_BASED_OUTPATIENT_CLINIC_OR_DEPARTMENT_OTHER): Payer: Self-pay

## 2022-07-01 ENCOUNTER — Ambulatory Visit
Admission: RE | Admit: 2022-07-01 | Discharge: 2022-07-01 | Disposition: A | Discharge status: Home / Self Care | Payer: BC Managed Care – PPO | Source: Ambulatory Visit | Attending: Radiation Oncology | Admitting: Radiation Oncology

## 2022-07-01 DIAGNOSIS — Z510 Encounter for antineoplastic radiation therapy: Secondary | ICD-10-CM | POA: Diagnosis not present

## 2022-07-01 DIAGNOSIS — T451X5D Adverse effect of antineoplastic and immunosuppressive drugs, subsequent encounter: Secondary | ICD-10-CM | POA: Diagnosis not present

## 2022-07-01 DIAGNOSIS — D6481 Anemia due to antineoplastic chemotherapy: Secondary | ICD-10-CM | POA: Diagnosis not present

## 2022-07-01 DIAGNOSIS — L409 Psoriasis, unspecified: Secondary | ICD-10-CM | POA: Diagnosis not present

## 2022-07-01 DIAGNOSIS — I10 Essential (primary) hypertension: Secondary | ICD-10-CM | POA: Diagnosis not present

## 2022-07-01 DIAGNOSIS — R110 Nausea: Secondary | ICD-10-CM | POA: Diagnosis not present

## 2022-07-01 DIAGNOSIS — Z452 Encounter for adjustment and management of vascular access device: Secondary | ICD-10-CM | POA: Diagnosis not present

## 2022-07-01 DIAGNOSIS — E860 Dehydration: Secondary | ICD-10-CM | POA: Diagnosis not present

## 2022-07-01 DIAGNOSIS — C20 Malignant neoplasm of rectum: Secondary | ICD-10-CM | POA: Diagnosis not present

## 2022-07-01 DIAGNOSIS — M109 Gout, unspecified: Secondary | ICD-10-CM | POA: Diagnosis not present

## 2022-07-01 DIAGNOSIS — Z923 Personal history of irradiation: Secondary | ICD-10-CM | POA: Diagnosis not present

## 2022-07-01 DIAGNOSIS — D61810 Antineoplastic chemotherapy induced pancytopenia: Secondary | ICD-10-CM | POA: Diagnosis not present

## 2022-07-01 LAB — RAD ONC ARIA SESSION SUMMARY
Course Elapsed Days: 15
Plan Fractions Treated to Date: 12
Plan Prescribed Dose Per Fraction: 1.8 Gy
Plan Total Fractions Prescribed: 25
Plan Total Prescribed Dose: 45 Gy
Reference Point Dosage Given to Date: 21.6 Gy
Reference Point Session Dosage Given: 1.8 Gy
Session Number: 12

## 2022-07-02 ENCOUNTER — Other Ambulatory Visit: Payer: Self-pay

## 2022-07-02 ENCOUNTER — Inpatient Hospital Stay: Payer: BC Managed Care – PPO

## 2022-07-02 ENCOUNTER — Ambulatory Visit
Admission: RE | Admit: 2022-07-02 | Discharge: 2022-07-02 | Disposition: A | Discharge status: Home / Self Care | Payer: BC Managed Care – PPO | Source: Ambulatory Visit | Attending: Radiation Oncology | Admitting: Radiation Oncology

## 2022-07-02 DIAGNOSIS — Z510 Encounter for antineoplastic radiation therapy: Secondary | ICD-10-CM | POA: Diagnosis not present

## 2022-07-02 DIAGNOSIS — I10 Essential (primary) hypertension: Secondary | ICD-10-CM | POA: Diagnosis not present

## 2022-07-02 DIAGNOSIS — R110 Nausea: Secondary | ICD-10-CM | POA: Diagnosis not present

## 2022-07-02 DIAGNOSIS — L409 Psoriasis, unspecified: Secondary | ICD-10-CM | POA: Diagnosis not present

## 2022-07-02 DIAGNOSIS — D6481 Anemia due to antineoplastic chemotherapy: Secondary | ICD-10-CM | POA: Diagnosis not present

## 2022-07-02 DIAGNOSIS — E860 Dehydration: Secondary | ICD-10-CM | POA: Diagnosis not present

## 2022-07-02 DIAGNOSIS — T451X5D Adverse effect of antineoplastic and immunosuppressive drugs, subsequent encounter: Secondary | ICD-10-CM | POA: Diagnosis not present

## 2022-07-02 DIAGNOSIS — Z923 Personal history of irradiation: Secondary | ICD-10-CM | POA: Diagnosis not present

## 2022-07-02 DIAGNOSIS — D61810 Antineoplastic chemotherapy induced pancytopenia: Secondary | ICD-10-CM | POA: Diagnosis not present

## 2022-07-02 DIAGNOSIS — M109 Gout, unspecified: Secondary | ICD-10-CM | POA: Diagnosis not present

## 2022-07-02 DIAGNOSIS — Z452 Encounter for adjustment and management of vascular access device: Secondary | ICD-10-CM | POA: Diagnosis not present

## 2022-07-02 DIAGNOSIS — C20 Malignant neoplasm of rectum: Secondary | ICD-10-CM | POA: Diagnosis not present

## 2022-07-02 LAB — RAD ONC ARIA SESSION SUMMARY
Course Elapsed Days: 16
Plan Fractions Treated to Date: 13
Plan Prescribed Dose Per Fraction: 1.8 Gy
Plan Total Fractions Prescribed: 25
Plan Total Prescribed Dose: 45 Gy
Reference Point Dosage Given to Date: 23.4 Gy
Reference Point Session Dosage Given: 1.8 Gy
Session Number: 13

## 2022-07-03 ENCOUNTER — Other Ambulatory Visit: Payer: Self-pay

## 2022-07-03 ENCOUNTER — Ambulatory Visit
Admission: RE | Admit: 2022-07-03 | Discharge: 2022-07-03 | Disposition: A | Discharge status: Home / Self Care | Payer: BC Managed Care – PPO | Source: Ambulatory Visit | Attending: Radiation Oncology | Admitting: Radiation Oncology

## 2022-07-03 DIAGNOSIS — Z923 Personal history of irradiation: Secondary | ICD-10-CM | POA: Diagnosis not present

## 2022-07-03 DIAGNOSIS — Z510 Encounter for antineoplastic radiation therapy: Secondary | ICD-10-CM | POA: Diagnosis not present

## 2022-07-03 DIAGNOSIS — Z932 Ileostomy status: Secondary | ICD-10-CM | POA: Diagnosis not present

## 2022-07-03 DIAGNOSIS — E860 Dehydration: Secondary | ICD-10-CM | POA: Diagnosis not present

## 2022-07-03 DIAGNOSIS — L409 Psoriasis, unspecified: Secondary | ICD-10-CM | POA: Diagnosis not present

## 2022-07-03 DIAGNOSIS — M109 Gout, unspecified: Secondary | ICD-10-CM | POA: Diagnosis not present

## 2022-07-03 DIAGNOSIS — C20 Malignant neoplasm of rectum: Secondary | ICD-10-CM | POA: Diagnosis not present

## 2022-07-03 DIAGNOSIS — Z452 Encounter for adjustment and management of vascular access device: Secondary | ICD-10-CM | POA: Diagnosis not present

## 2022-07-03 DIAGNOSIS — D61810 Antineoplastic chemotherapy induced pancytopenia: Secondary | ICD-10-CM | POA: Diagnosis not present

## 2022-07-03 DIAGNOSIS — I10 Essential (primary) hypertension: Secondary | ICD-10-CM | POA: Diagnosis not present

## 2022-07-03 DIAGNOSIS — R110 Nausea: Secondary | ICD-10-CM | POA: Diagnosis not present

## 2022-07-03 DIAGNOSIS — T451X5D Adverse effect of antineoplastic and immunosuppressive drugs, subsequent encounter: Secondary | ICD-10-CM | POA: Diagnosis not present

## 2022-07-03 DIAGNOSIS — D6481 Anemia due to antineoplastic chemotherapy: Secondary | ICD-10-CM | POA: Diagnosis not present

## 2022-07-03 LAB — RAD ONC ARIA SESSION SUMMARY
Course Elapsed Days: 17
Plan Fractions Treated to Date: 14
Plan Prescribed Dose Per Fraction: 1.8 Gy
Plan Total Fractions Prescribed: 25
Plan Total Prescribed Dose: 45 Gy
Reference Point Dosage Given to Date: 25.2 Gy
Reference Point Session Dosage Given: 1.8 Gy
Session Number: 14

## 2022-07-04 ENCOUNTER — Inpatient Hospital Stay: Payer: BC Managed Care – PPO

## 2022-07-04 ENCOUNTER — Other Ambulatory Visit: Payer: Self-pay | Admitting: *Deleted

## 2022-07-04 ENCOUNTER — Ambulatory Visit
Admission: RE | Admit: 2022-07-04 | Discharge: 2022-07-04 | Disposition: A | Discharge status: Home / Self Care | Payer: BC Managed Care – PPO | Source: Ambulatory Visit | Attending: Radiation Oncology | Admitting: Radiation Oncology

## 2022-07-04 ENCOUNTER — Other Ambulatory Visit: Payer: Self-pay

## 2022-07-04 DIAGNOSIS — Z510 Encounter for antineoplastic radiation therapy: Secondary | ICD-10-CM | POA: Diagnosis not present

## 2022-07-04 DIAGNOSIS — C20 Malignant neoplasm of rectum: Secondary | ICD-10-CM

## 2022-07-04 DIAGNOSIS — T451X5D Adverse effect of antineoplastic and immunosuppressive drugs, subsequent encounter: Secondary | ICD-10-CM | POA: Diagnosis not present

## 2022-07-04 DIAGNOSIS — I10 Essential (primary) hypertension: Secondary | ICD-10-CM | POA: Diagnosis not present

## 2022-07-04 DIAGNOSIS — E860 Dehydration: Secondary | ICD-10-CM | POA: Diagnosis not present

## 2022-07-04 DIAGNOSIS — D61810 Antineoplastic chemotherapy induced pancytopenia: Secondary | ICD-10-CM | POA: Diagnosis not present

## 2022-07-04 DIAGNOSIS — D6481 Anemia due to antineoplastic chemotherapy: Secondary | ICD-10-CM | POA: Diagnosis not present

## 2022-07-04 DIAGNOSIS — M109 Gout, unspecified: Secondary | ICD-10-CM | POA: Diagnosis not present

## 2022-07-04 DIAGNOSIS — Z923 Personal history of irradiation: Secondary | ICD-10-CM | POA: Diagnosis not present

## 2022-07-04 DIAGNOSIS — Z452 Encounter for adjustment and management of vascular access device: Secondary | ICD-10-CM | POA: Diagnosis not present

## 2022-07-04 DIAGNOSIS — L409 Psoriasis, unspecified: Secondary | ICD-10-CM | POA: Diagnosis not present

## 2022-07-04 DIAGNOSIS — E871 Hypo-osmolality and hyponatremia: Secondary | ICD-10-CM

## 2022-07-04 DIAGNOSIS — R110 Nausea: Secondary | ICD-10-CM | POA: Diagnosis not present

## 2022-07-04 LAB — RAD ONC ARIA SESSION SUMMARY
Course Elapsed Days: 18
Plan Fractions Treated to Date: 15
Plan Prescribed Dose Per Fraction: 1.8 Gy
Plan Total Fractions Prescribed: 25
Plan Total Prescribed Dose: 45 Gy
Reference Point Dosage Given to Date: 27 Gy
Reference Point Session Dosage Given: 1.8 Gy
Session Number: 15

## 2022-07-04 MED ORDER — SODIUM CHLORIDE 0.9% FLUSH
10.0000 mL | Freq: Once | INTRAVENOUS | Status: AC
  Administered 2022-07-04: 10 mL via INTRAVENOUS

## 2022-07-04 MED ORDER — SODIUM CHLORIDE 0.9 % IV SOLN: INTRAVENOUS | Status: DC

## 2022-07-04 MED ORDER — HEPARIN SOD (PORK) LOCK FLUSH 100 UNIT/ML IV SOLN
500.0000 [IU] | Freq: Once | INTRAVENOUS | Status: AC
  Administered 2022-07-04: 500 [IU] via INTRAVENOUS

## 2022-07-04 NOTE — Patient Instructions (Signed)
Rehydration, Adult Rehydration is the replacement of body fluids, salts, and minerals (electrolytes) that are lost during dehydration. Dehydration is when there is not enough water or other fluids in the body. This happens when you lose more fluids than you take in. Common causes of dehydration include: Not drinking enough fluids. This can occur when you are ill or doing activities that require a lot of energy, especially in hot weather. Conditions that cause loss of water or other fluids, such as diarrhea, vomiting, sweating, or urinating a lot. Other illnesses, such as fever or infection. Certain medicines, such as those that remove excess fluid from the body (diuretics). Symptoms of mild or moderate dehydration may include thirst, dry lips and mouth, and dizziness. Symptoms of severe dehydration may include increased heart rate, confusion, fainting, and not urinating. For severe dehydration, you may need to get fluids through an IV at the hospital. For mild or moderate dehydration, you can usually rehydrate at home by drinking certain fluids as told by your health care provider. What are the risks? Generally, rehydration is safe. However, taking in too much fluid (overhydration) can be a problem. This is rare. Overhydration can cause an electrolyte imbalance, kidney failure, or a decrease in salt (sodium) levels in the body. Supplies needed You will need an oral rehydration solution (ORS) if your health care provider tells you to use one. This is a drink to treat dehydration. It can be found in pharmacies and retail stores. How to rehydrate Fluids Follow instructions from your health care provider for rehydration. The kind of fluid and the amount you should drink depend on your condition. In general, you should choose drinks that you prefer. If told by your health care provider, drink an ORS. Make an ORS by following instructions on the package. Start by drinking small amounts, about  cup (120  mL) every 5-10 minutes. Slowly increase how much you drink until you have taken the amount recommended by your health care provider. Drink enough clear fluids to keep your urine pale yellow. If you were told to drink an ORS, finish it first, then start slowly drinking other clear fluids. Drink fluids such as: Water. This includes sparkling water and flavored water. Drinking only water can lead to having too little sodium in your body (hyponatremia). Follow the advice of your health care provider. Water from ice chips you suck on. Fruit juice with water you add to it (diluted). Sports drinks. Hot or cold herbal teas. Broth-based soups. Milk or milk products. Food Follow instructions from your health care provider about what to eat while you rehydrate. Your health care provider may recommend that you slowly begin eating regular foods in small amounts. Eat foods that contain a healthy balance of electrolytes, such as bananas, oranges, potatoes, tomatoes, and spinach. Avoid foods that are greasy or contain a lot of sugar. In some cases, you may get nutrition through a feeding tube that is passed through your nose and into your stomach (nasogastric tube, or NG tube). This may be done if you have uncontrolled vomiting or diarrhea. Beverages to avoid  Certain beverages may make dehydration worse. While you rehydrate, avoid drinking alcohol. How to tell if you are recovering from dehydration You may be recovering from dehydration if: You are urinating more often than before you started rehydrating. Your urine is pale yellow. Your energy level improves. You vomit less frequently. You have diarrhea less frequently. Your appetite improves or returns to normal. You feel less dizzy or less light-headed.   Your skin tone and color start to look more normal. Follow these instructions at home: Take over-the-counter and prescription medicines only as told by your health care provider. Do not take sodium  tablets. Doing this can lead to having too much sodium in your body (hypernatremia). Contact a health care provider if: You continue to have symptoms of mild or moderate dehydration, such as: Thirst. Dry lips. Slightly dry mouth. Dizziness. Dark urine or less urine than normal. Muscle cramps. You continue to vomit or have diarrhea. Get help right away if you: Have symptoms of dehydration that get worse. Have a fever. Have a severe headache. Have been vomiting and the following happens: Your vomiting gets worse or does not go away. Your vomit includes blood or green matter (bile). You cannot eat or drink without vomiting. Have problems with urination or bowel movements, such as: Diarrhea that gets worse or does not go away. Blood in your stool (feces). This may cause stool to look black and tarry. Not urinating, or urinating only a small amount of very dark urine, within 6-8 hours. Have trouble breathing. Have symptoms that get worse with treatment. These symptoms may represent a serious problem that is an emergency. Do not wait to see if the symptoms will go away. Get medical help right away. Call your local emergency services (911 in the [place] Do not drive yourself to the hospital. Summary Rehydration is the replacement of body fluids and minerals (electrolytes) that are lost during dehydration. Follow instructions from your health care provider for rehydration. The kind of fluid and amount you should drink depend on your condition. Slowly increase how much you drink until you have taken the amount recommended by your health care provider. Contact your health care provider if you continue to show signs of mild or moderate dehydration. This information is not intended to replace advice given to you by your health care provider. Make sure you discuss any questions you have with your health care provider. Document Revised: 01/04/2020 Document Reviewed: 11/14/2019 Elsevier Patient  Education  2023 [employer]  

## 2022-07-04 NOTE — Chronic Care Management (AMB) (Unsigned)
  Care Coordination  Outreach Note  07/04/2022 Name: Kenneth [name] MRN: [id number] DOB: March 02, 1961   Care Coordination Outreach Attempts  A second unsuccessful outreach was attempted today to offer the patient with information about available care coordination services as a benefit of their health plan.     Follow Up Plan:  Additional outreach attempts will be made to offer the patient care coordination information and services.   Encounter Outcome:  No Answer  Julian Hy, [place] Direct Dial: [phone]

## 2022-07-07 ENCOUNTER — Other Ambulatory Visit: Payer: Self-pay

## 2022-07-07 ENCOUNTER — Inpatient Hospital Stay: Payer: BC Managed Care – PPO

## 2022-07-07 ENCOUNTER — Ambulatory Visit
Admission: RE | Admit: 2022-07-07 | Discharge: 2022-07-07 | Disposition: A | Discharge status: Home / Self Care | Payer: BC Managed Care – PPO | Source: Ambulatory Visit | Attending: Radiation Oncology | Admitting: Radiation Oncology

## 2022-07-07 VITALS — BP 112/81 | HR 63 | Temp 97.7°F | Resp 18

## 2022-07-07 DIAGNOSIS — M109 Gout, unspecified: Secondary | ICD-10-CM | POA: Diagnosis not present

## 2022-07-07 DIAGNOSIS — D6481 Anemia due to antineoplastic chemotherapy: Secondary | ICD-10-CM | POA: Diagnosis not present

## 2022-07-07 DIAGNOSIS — C20 Malignant neoplasm of rectum: Secondary | ICD-10-CM | POA: Diagnosis not present

## 2022-07-07 DIAGNOSIS — L409 Psoriasis, unspecified: Secondary | ICD-10-CM | POA: Diagnosis not present

## 2022-07-07 DIAGNOSIS — R110 Nausea: Secondary | ICD-10-CM | POA: Diagnosis not present

## 2022-07-07 DIAGNOSIS — Z95828 Presence of other vascular implants and grafts: Secondary | ICD-10-CM

## 2022-07-07 DIAGNOSIS — E871 Hypo-osmolality and hyponatremia: Secondary | ICD-10-CM

## 2022-07-07 DIAGNOSIS — D61810 Antineoplastic chemotherapy induced pancytopenia: Secondary | ICD-10-CM | POA: Diagnosis not present

## 2022-07-07 DIAGNOSIS — T451X5D Adverse effect of antineoplastic and immunosuppressive drugs, subsequent encounter: Secondary | ICD-10-CM | POA: Diagnosis not present

## 2022-07-07 DIAGNOSIS — E860 Dehydration: Secondary | ICD-10-CM | POA: Diagnosis not present

## 2022-07-07 DIAGNOSIS — Z510 Encounter for antineoplastic radiation therapy: Secondary | ICD-10-CM | POA: Diagnosis not present

## 2022-07-07 DIAGNOSIS — Z923 Personal history of irradiation: Secondary | ICD-10-CM | POA: Diagnosis not present

## 2022-07-07 DIAGNOSIS — I10 Essential (primary) hypertension: Secondary | ICD-10-CM | POA: Diagnosis not present

## 2022-07-07 DIAGNOSIS — Z452 Encounter for adjustment and management of vascular access device: Secondary | ICD-10-CM | POA: Diagnosis not present

## 2022-07-07 LAB — CMP (CANCER CENTER ONLY)
ALT: 20 U/L (ref 0–44)
AST: 28 U/L (ref 15–41)
Albumin: 3.7 g/dL (ref 3.5–5.0)
Alkaline Phosphatase: 157 U/L — ABNORMAL HIGH (ref 38–126)
Anion gap: 7 (ref 5–15)
BUN: 11 mg/dL (ref 6–20)
CO2: 26 mmol/L (ref 22–32)
Calcium: 9 mg/dL (ref 8.9–10.3)
Chloride: 105 mmol/L (ref 98–111)
Creatinine: 1.31 mg/dL — ABNORMAL HIGH (ref 0.61–1.24)
GFR, Estimated: >60 mL/min (ref >60)
Glucose, Bld: 126 mg/dL — ABNORMAL HIGH (ref 70–99)
Potassium: 3.8 mmol/L (ref 3.5–5.1)
Sodium: 138 mmol/L (ref 135–145)
Total Bilirubin: 0.5 mg/dL (ref 0.3–1.2)
Total Protein: 6.3 g/dL — ABNORMAL LOW (ref 6.5–8.1)

## 2022-07-07 LAB — RAD ONC ARIA SESSION SUMMARY
Course Elapsed Days: 21
Plan Fractions Treated to Date: 16
Plan Prescribed Dose Per Fraction: 1.8 Gy
Plan Total Fractions Prescribed: 25
Plan Total Prescribed Dose: 45 Gy
Reference Point Dosage Given to Date: 28.8 Gy
Reference Point Session Dosage Given: 1.8 Gy
Session Number: 16

## 2022-07-07 LAB — CBC WITH DIFFERENTIAL (CANCER CENTER ONLY)
Abs Immature Granulocytes: 0.01 10*3/uL (ref 0.00–0.07)
Basophils Absolute: 0 10*3/uL (ref 0.0–0.1)
Basophils Relative: 0 %
Eosinophils Absolute: 0.1 10*3/uL (ref 0.0–0.5)
Eosinophils Relative: 4 %
HCT: 27 % — ABNORMAL LOW (ref 39.0–52.0)
Hemoglobin: 8.9 g/dL — ABNORMAL LOW (ref 13.0–17.0)
Immature Granulocytes: 0 %
Lymphocytes Relative: 15 %
Lymphs Abs: 0.5 10*3/uL — ABNORMAL LOW (ref 0.7–4.0)
MCH: 33.3 pg (ref 26.0–34.0)
MCHC: 33 g/dL (ref 30.0–36.0)
MCV: 101.1 fL — ABNORMAL HIGH (ref 80.0–100.0)
Monocytes Absolute: 0.2 10*3/uL (ref 0.1–1.0)
Monocytes Relative: 7 %
Neutro Abs: 2.4 10*3/uL (ref 1.7–7.7)
Neutrophils Relative %: 74 %
Platelet Count: 88 10*3/uL — ABNORMAL LOW (ref 150–400)
RBC: 2.67 MIL/uL — ABNORMAL LOW (ref 4.22–5.81)
RDW: 14.5 % (ref 11.5–15.5)
WBC Count: 3.2 10*3/uL — ABNORMAL LOW (ref 4.0–10.5)
nRBC: 0 % (ref 0.0–0.2)

## 2022-07-07 LAB — MAGNESIUM: Magnesium: 1.6 mg/dL — ABNORMAL LOW (ref 1.7–2.4)

## 2022-07-07 LAB — SAMPLE TO BLOOD BANK

## 2022-07-07 MED ORDER — SODIUM CHLORIDE 0.9 % IV SOLN: INTRAVENOUS | Status: AC

## 2022-07-07 MED ORDER — HEPARIN SOD (PORK) LOCK FLUSH 100 UNIT/ML IV SOLN
500.0000 [IU] | Freq: Once | INTRAVENOUS | Status: AC
  Administered 2022-07-07: 500 [IU] via INTRAVENOUS

## 2022-07-07 MED ORDER — SODIUM CHLORIDE 0.9% FLUSH
10.0000 mL | Freq: Once | INTRAVENOUS | Status: AC
  Administered 2022-07-07: 10 mL via INTRAVENOUS

## 2022-07-07 NOTE — Patient Instructions (Signed)

## 2022-07-07 NOTE — Chronic Care Management (AMB) (Signed)
  Care Coordination  Outreach Note  07/07/2022 Name: REDELL NAZIR MRN: [id number] DOB: 11-Jul-1961   Care Coordination Outreach Attempts  A third unsuccessful outreach was attempted today to offer the patient with information about available care coordination services as a benefit of their health plan.   Follow Up Plan:  No further outreach attempts will be made at this time. We have been unable to contact the patient to offer or enroll patient in care coordination services  Encounter Outcome:  No Answer  Julian Hy, [place] Direct Dial: [phone]

## 2022-07-07 NOTE — Patient Instructions (Signed)
Rehydration, Adult Rehydration is the replacement of body fluids, salts, and minerals (electrolytes) that are lost during dehydration. Dehydration is when there is not enough water or other fluids in the body. This happens when you lose more fluids than you take in. Common causes of dehydration include: Not drinking enough fluids. This can occur when you are ill or doing activities that require a lot of energy, especially in hot weather. Conditions that cause loss of water or other fluids, such as diarrhea, vomiting, sweating, or urinating a lot. Other illnesses, such as fever or infection. Certain medicines, such as those that remove excess fluid from the body (diuretics). Symptoms of mild or moderate dehydration may include thirst, dry lips and mouth, and dizziness. Symptoms of severe dehydration may include increased heart rate, confusion, fainting, and not urinating. For severe dehydration, you may need to get fluids through an IV at the hospital. For mild or moderate dehydration, you can usually rehydrate at home by drinking certain fluids as told by your health care provider. What are the risks? Generally, rehydration is safe. However, taking in too much fluid (overhydration) can be a problem. This is rare. Overhydration can cause an electrolyte imbalance, kidney failure, or a decrease in salt (sodium) levels in the body. Supplies needed You will need an oral rehydration solution (ORS) if your health care provider tells you to use one. This is a drink to treat dehydration. It can be found in pharmacies and retail stores. How to rehydrate Fluids Follow instructions from your health care provider for rehydration. The kind of fluid and the amount you should drink depend on your condition. In general, you should choose drinks that you prefer. If told by your health care provider, drink an ORS. Make an ORS by following instructions on the package. Start by drinking small amounts, about  cup (120  mL) every 5-10 minutes. Slowly increase how much you drink until you have taken the amount recommended by your health care provider. Drink enough clear fluids to keep your urine pale yellow. If you were told to drink an ORS, finish it first, then start slowly drinking other clear fluids. Drink fluids such as: Water. This includes sparkling water and flavored water. Drinking only water can lead to having too little sodium in your body (hyponatremia). Follow the advice of your health care provider. Water from ice chips you suck on. Fruit juice with water you add to it (diluted). Sports drinks. Hot or cold herbal teas. Broth-based soups. Milk or milk products. Food Follow instructions from your health care provider about what to eat while you rehydrate. Your health care provider may recommend that you slowly begin eating regular foods in small amounts. Eat foods that contain a healthy balance of electrolytes, such as bananas, oranges, potatoes, tomatoes, and spinach. Avoid foods that are greasy or contain a lot of sugar. In some cases, you may get nutrition through a feeding tube that is passed through your nose and into your stomach (nasogastric tube, or NG tube). This may be done if you have uncontrolled vomiting or diarrhea. Beverages to avoid  Certain beverages may make dehydration worse. While you rehydrate, avoid drinking alcohol. How to tell if you are recovering from dehydration You may be recovering from dehydration if: You are urinating more often than before you started rehydrating. Your urine is pale yellow. Your energy level improves. You vomit less frequently. You have diarrhea less frequently. Your appetite improves or returns to normal. You feel less dizzy or less light-headed.   Your skin tone and color start to look more normal. Follow these instructions at home: Take over-the-counter and prescription medicines only as told by your health care provider. Do not take sodium  tablets. Doing this can lead to having too much sodium in your body (hypernatremia). Contact a health care provider if: You continue to have symptoms of mild or moderate dehydration, such as: Thirst. Dry lips. Slightly dry mouth. Dizziness. Dark urine or less urine than normal. Muscle cramps. You continue to vomit or have diarrhea. Get help right away if you: Have symptoms of dehydration that get worse. Have a fever. Have a severe headache. Have been vomiting and the following happens: Your vomiting gets worse or does not go away. Your vomit includes blood or green matter (bile). You cannot eat or drink without vomiting. Have problems with urination or bowel movements, such as: Diarrhea that gets worse or does not go away. Blood in your stool (feces). This may cause stool to look black and tarry. Not urinating, or urinating only a small amount of very dark urine, within 6-8 hours. Have trouble breathing. Have symptoms that get worse with treatment. These symptoms may represent a serious problem that is an emergency. Do not wait to see if the symptoms will go away. Get medical help right away. Call your local emergency services (911 in the [place] Do not drive yourself to the hospital. Summary Rehydration is the replacement of body fluids and minerals (electrolytes) that are lost during dehydration. Follow instructions from your health care provider for rehydration. The kind of fluid and amount you should drink depend on your condition. Slowly increase how much you drink until you have taken the amount recommended by your health care provider. Contact your health care provider if you continue to show signs of mild or moderate dehydration. This information is not intended to replace advice given to you by your health care provider. Make sure you discuss any questions you have with your health care provider. Document Revised: 01/04/2020 Document Reviewed: 11/14/2019 Elsevier Patient  Education  2023 [employer]  

## 2022-07-08 ENCOUNTER — Ambulatory Visit
Admission: RE | Admit: 2022-07-08 | Discharge: 2022-07-08 | Disposition: A | Discharge status: Home / Self Care | Payer: BC Managed Care – PPO | Source: Ambulatory Visit | Attending: Radiation Oncology | Admitting: Radiation Oncology

## 2022-07-08 ENCOUNTER — Other Ambulatory Visit: Payer: Self-pay

## 2022-07-08 DIAGNOSIS — D6481 Anemia due to antineoplastic chemotherapy: Secondary | ICD-10-CM | POA: Diagnosis not present

## 2022-07-08 DIAGNOSIS — T451X5D Adverse effect of antineoplastic and immunosuppressive drugs, subsequent encounter: Secondary | ICD-10-CM | POA: Diagnosis not present

## 2022-07-08 DIAGNOSIS — I10 Essential (primary) hypertension: Secondary | ICD-10-CM | POA: Diagnosis not present

## 2022-07-08 DIAGNOSIS — C20 Malignant neoplasm of rectum: Secondary | ICD-10-CM | POA: Diagnosis not present

## 2022-07-08 DIAGNOSIS — D61810 Antineoplastic chemotherapy induced pancytopenia: Secondary | ICD-10-CM | POA: Diagnosis not present

## 2022-07-08 DIAGNOSIS — Z923 Personal history of irradiation: Secondary | ICD-10-CM | POA: Diagnosis not present

## 2022-07-08 DIAGNOSIS — Z452 Encounter for adjustment and management of vascular access device: Secondary | ICD-10-CM | POA: Diagnosis not present

## 2022-07-08 DIAGNOSIS — R110 Nausea: Secondary | ICD-10-CM | POA: Diagnosis not present

## 2022-07-08 DIAGNOSIS — M109 Gout, unspecified: Secondary | ICD-10-CM | POA: Diagnosis not present

## 2022-07-08 DIAGNOSIS — E860 Dehydration: Secondary | ICD-10-CM | POA: Diagnosis not present

## 2022-07-08 DIAGNOSIS — L409 Psoriasis, unspecified: Secondary | ICD-10-CM | POA: Diagnosis not present

## 2022-07-08 DIAGNOSIS — Z510 Encounter for antineoplastic radiation therapy: Secondary | ICD-10-CM | POA: Diagnosis not present

## 2022-07-08 LAB — RAD ONC ARIA SESSION SUMMARY
Course Elapsed Days: 22
Plan Fractions Treated to Date: 17
Plan Prescribed Dose Per Fraction: 1.8 Gy
Plan Total Fractions Prescribed: 25
Plan Total Prescribed Dose: 45 Gy
Reference Point Dosage Given to Date: 30.6 Gy
Reference Point Session Dosage Given: 1.8 Gy
Session Number: 17

## 2022-07-09 ENCOUNTER — Other Ambulatory Visit: Payer: Self-pay

## 2022-07-09 ENCOUNTER — Ambulatory Visit
Admission: RE | Admit: 2022-07-09 | Discharge: 2022-07-09 | Disposition: A | Discharge status: Home / Self Care | Payer: BC Managed Care – PPO | Source: Ambulatory Visit | Attending: Radiation Oncology | Admitting: Radiation Oncology

## 2022-07-09 ENCOUNTER — Inpatient Hospital Stay: Payer: BC Managed Care – PPO

## 2022-07-09 DIAGNOSIS — C20 Malignant neoplasm of rectum: Secondary | ICD-10-CM | POA: Diagnosis not present

## 2022-07-09 DIAGNOSIS — E860 Dehydration: Secondary | ICD-10-CM | POA: Diagnosis not present

## 2022-07-09 DIAGNOSIS — Z510 Encounter for antineoplastic radiation therapy: Secondary | ICD-10-CM | POA: Diagnosis not present

## 2022-07-09 DIAGNOSIS — D61810 Antineoplastic chemotherapy induced pancytopenia: Secondary | ICD-10-CM | POA: Diagnosis not present

## 2022-07-09 DIAGNOSIS — T451X5D Adverse effect of antineoplastic and immunosuppressive drugs, subsequent encounter: Secondary | ICD-10-CM | POA: Diagnosis not present

## 2022-07-09 DIAGNOSIS — R110 Nausea: Secondary | ICD-10-CM | POA: Diagnosis not present

## 2022-07-09 DIAGNOSIS — L409 Psoriasis, unspecified: Secondary | ICD-10-CM | POA: Diagnosis not present

## 2022-07-09 DIAGNOSIS — I10 Essential (primary) hypertension: Secondary | ICD-10-CM | POA: Diagnosis not present

## 2022-07-09 DIAGNOSIS — M109 Gout, unspecified: Secondary | ICD-10-CM | POA: Diagnosis not present

## 2022-07-09 DIAGNOSIS — Z452 Encounter for adjustment and management of vascular access device: Secondary | ICD-10-CM | POA: Diagnosis not present

## 2022-07-09 DIAGNOSIS — D6481 Anemia due to antineoplastic chemotherapy: Secondary | ICD-10-CM | POA: Diagnosis not present

## 2022-07-09 DIAGNOSIS — Z923 Personal history of irradiation: Secondary | ICD-10-CM | POA: Diagnosis not present

## 2022-07-09 LAB — RAD ONC ARIA SESSION SUMMARY
Course Elapsed Days: 23
Plan Fractions Treated to Date: 18
Plan Prescribed Dose Per Fraction: 1.8 Gy
Plan Total Fractions Prescribed: 25
Plan Total Prescribed Dose: 45 Gy
Reference Point Dosage Given to Date: 32.4 Gy
Reference Point Session Dosage Given: 1.8 Gy
Session Number: 18

## 2022-07-10 ENCOUNTER — Ambulatory Visit
Admission: RE | Admit: 2022-07-10 | Discharge: 2022-07-10 | Disposition: A | Discharge status: Home / Self Care | Payer: BC Managed Care – PPO | Source: Ambulatory Visit | Attending: Radiation Oncology | Admitting: Radiation Oncology

## 2022-07-10 ENCOUNTER — Other Ambulatory Visit: Payer: Self-pay

## 2022-07-10 ENCOUNTER — Encounter: Payer: Self-pay | Admitting: Oncology

## 2022-07-10 ENCOUNTER — Other Ambulatory Visit: Payer: Self-pay | Admitting: *Deleted

## 2022-07-10 DIAGNOSIS — D61810 Antineoplastic chemotherapy induced pancytopenia: Secondary | ICD-10-CM | POA: Diagnosis not present

## 2022-07-10 DIAGNOSIS — I10 Essential (primary) hypertension: Secondary | ICD-10-CM | POA: Diagnosis not present

## 2022-07-10 DIAGNOSIS — E860 Dehydration: Secondary | ICD-10-CM | POA: Diagnosis not present

## 2022-07-10 DIAGNOSIS — M109 Gout, unspecified: Secondary | ICD-10-CM | POA: Diagnosis not present

## 2022-07-10 DIAGNOSIS — E871 Hypo-osmolality and hyponatremia: Secondary | ICD-10-CM

## 2022-07-10 DIAGNOSIS — Z510 Encounter for antineoplastic radiation therapy: Secondary | ICD-10-CM | POA: Diagnosis not present

## 2022-07-10 DIAGNOSIS — C20 Malignant neoplasm of rectum: Secondary | ICD-10-CM | POA: Diagnosis not present

## 2022-07-10 DIAGNOSIS — D6481 Anemia due to antineoplastic chemotherapy: Secondary | ICD-10-CM | POA: Diagnosis not present

## 2022-07-10 DIAGNOSIS — Z923 Personal history of irradiation: Secondary | ICD-10-CM | POA: Diagnosis not present

## 2022-07-10 DIAGNOSIS — T451X5D Adverse effect of antineoplastic and immunosuppressive drugs, subsequent encounter: Secondary | ICD-10-CM | POA: Diagnosis not present

## 2022-07-10 DIAGNOSIS — R110 Nausea: Secondary | ICD-10-CM | POA: Diagnosis not present

## 2022-07-10 DIAGNOSIS — L409 Psoriasis, unspecified: Secondary | ICD-10-CM | POA: Diagnosis not present

## 2022-07-10 DIAGNOSIS — Z452 Encounter for adjustment and management of vascular access device: Secondary | ICD-10-CM | POA: Diagnosis not present

## 2022-07-10 LAB — RAD ONC ARIA SESSION SUMMARY
Course Elapsed Days: 24
Plan Fractions Treated to Date: 19
Plan Prescribed Dose Per Fraction: 1.8 Gy
Plan Total Fractions Prescribed: 25
Plan Total Prescribed Dose: 45 Gy
Reference Point Dosage Given to Date: 34.2 Gy
Reference Point Session Dosage Given: 1.8 Gy
Session Number: 19

## 2022-07-11 ENCOUNTER — Ambulatory Visit
Admission: RE | Admit: 2022-07-11 | Discharge: 2022-07-11 | Disposition: A | Discharge status: Home / Self Care | Payer: BC Managed Care – PPO | Source: Ambulatory Visit | Attending: Radiation Oncology | Admitting: Radiation Oncology

## 2022-07-11 ENCOUNTER — Other Ambulatory Visit: Payer: Self-pay

## 2022-07-11 ENCOUNTER — Inpatient Hospital Stay: Payer: BC Managed Care – PPO

## 2022-07-11 VITALS — BP 115/81 | HR 61 | Temp 97.5°F | Resp 18

## 2022-07-11 DIAGNOSIS — C20 Malignant neoplasm of rectum: Secondary | ICD-10-CM | POA: Diagnosis not present

## 2022-07-11 DIAGNOSIS — D6481 Anemia due to antineoplastic chemotherapy: Secondary | ICD-10-CM | POA: Diagnosis not present

## 2022-07-11 DIAGNOSIS — Z923 Personal history of irradiation: Secondary | ICD-10-CM | POA: Diagnosis not present

## 2022-07-11 DIAGNOSIS — E860 Dehydration: Secondary | ICD-10-CM | POA: Diagnosis not present

## 2022-07-11 DIAGNOSIS — I10 Essential (primary) hypertension: Secondary | ICD-10-CM | POA: Diagnosis not present

## 2022-07-11 DIAGNOSIS — D61810 Antineoplastic chemotherapy induced pancytopenia: Secondary | ICD-10-CM | POA: Diagnosis not present

## 2022-07-11 DIAGNOSIS — Z452 Encounter for adjustment and management of vascular access device: Secondary | ICD-10-CM | POA: Diagnosis not present

## 2022-07-11 DIAGNOSIS — T451X5D Adverse effect of antineoplastic and immunosuppressive drugs, subsequent encounter: Secondary | ICD-10-CM | POA: Diagnosis not present

## 2022-07-11 DIAGNOSIS — R110 Nausea: Secondary | ICD-10-CM | POA: Diagnosis not present

## 2022-07-11 DIAGNOSIS — M109 Gout, unspecified: Secondary | ICD-10-CM | POA: Diagnosis not present

## 2022-07-11 DIAGNOSIS — L409 Psoriasis, unspecified: Secondary | ICD-10-CM | POA: Diagnosis not present

## 2022-07-11 DIAGNOSIS — E871 Hypo-osmolality and hyponatremia: Secondary | ICD-10-CM

## 2022-07-11 DIAGNOSIS — Z510 Encounter for antineoplastic radiation therapy: Secondary | ICD-10-CM | POA: Diagnosis not present

## 2022-07-11 DIAGNOSIS — Z95828 Presence of other vascular implants and grafts: Secondary | ICD-10-CM

## 2022-07-11 LAB — RAD ONC ARIA SESSION SUMMARY
Course Elapsed Days: 25
Plan Fractions Treated to Date: 20
Plan Prescribed Dose Per Fraction: 1.8 Gy
Plan Total Fractions Prescribed: 25
Plan Total Prescribed Dose: 45 Gy
Reference Point Dosage Given to Date: 36 Gy
Reference Point Session Dosage Given: 1.8 Gy
Session Number: 20

## 2022-07-11 MED ORDER — SODIUM CHLORIDE 0.9% FLUSH
10.0000 mL | Freq: Once | INTRAVENOUS | Status: AC
  Administered 2022-07-11: 10 mL via INTRAVENOUS

## 2022-07-11 MED ORDER — HEPARIN SOD (PORK) LOCK FLUSH 100 UNIT/ML IV SOLN
500.0000 [IU] | Freq: Once | INTRAVENOUS | Status: AC
  Administered 2022-07-11: 500 [IU] via INTRAVENOUS

## 2022-07-11 MED ORDER — SODIUM CHLORIDE 0.9 % IV SOLN: INTRAVENOUS | Status: AC

## 2022-07-13 IMAGING — CT CT CHEST-ABD-PELV W/ CM
3 of 4 series · 13 of 31 positions shown, 17 images · IV contrast (agent unspecified)
Comparison: None.

CLINICAL DATA: Colon mass, weight loss

EXAM:
CT CHEST, ABDOMEN, AND PELVIS WITH CONTRAST
TECHNIQUE: Multidetector CT imaging of the chest, abdomen and pelvis was
performed following the standard protocol during bolus
administration of intravenous contrast.

[Series 2: cap w/ · axial · 0.98mm/px · z∈[-494,-269]mm · 3 of 137 slices shown, 7 images]
[im 46/137  mediastinal]
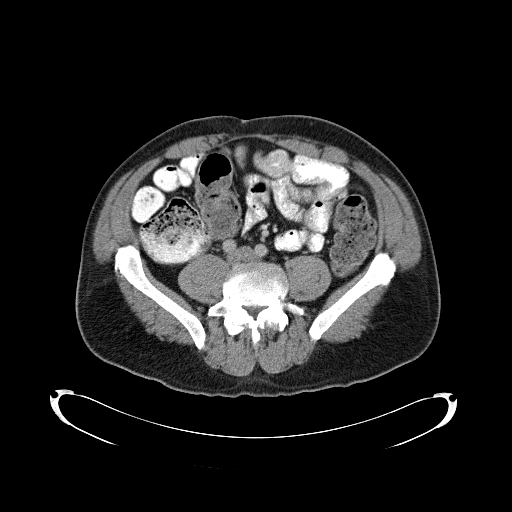
[im 46/137  lung]
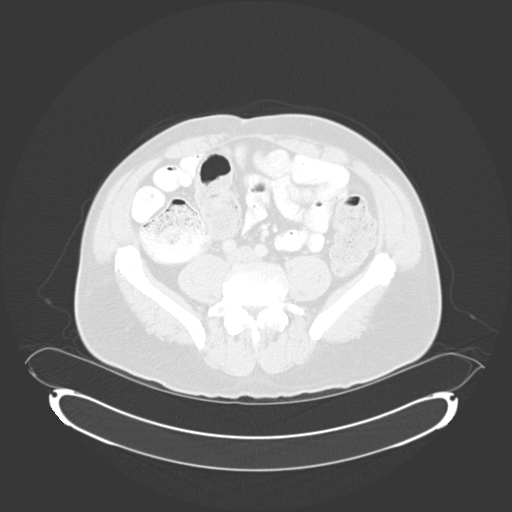
[im 46/137  bone]
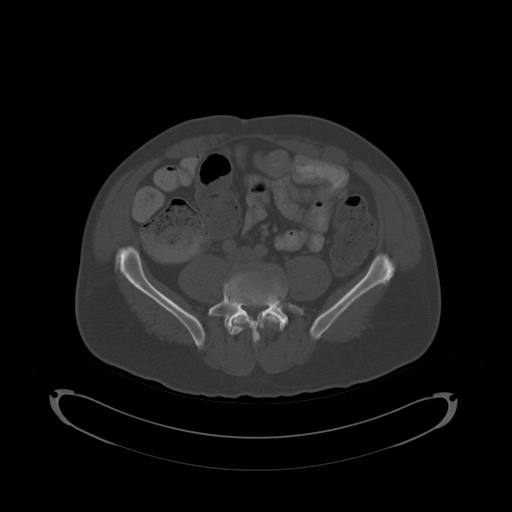
[im 67/137  mediastinal]
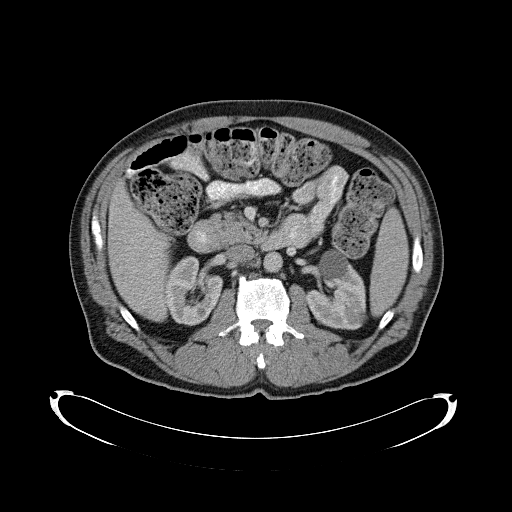
[im 67/137  lung]
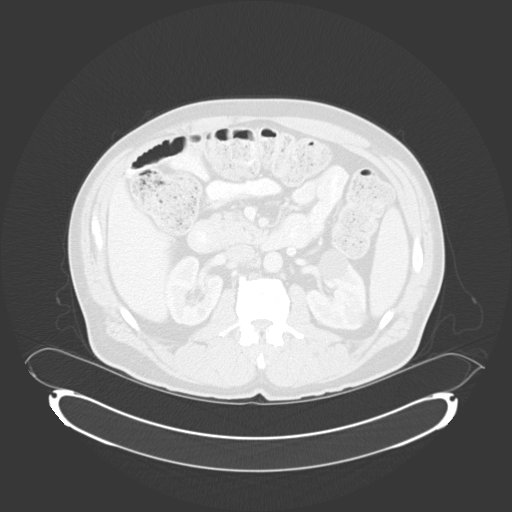
[im 91/137  mediastinal]
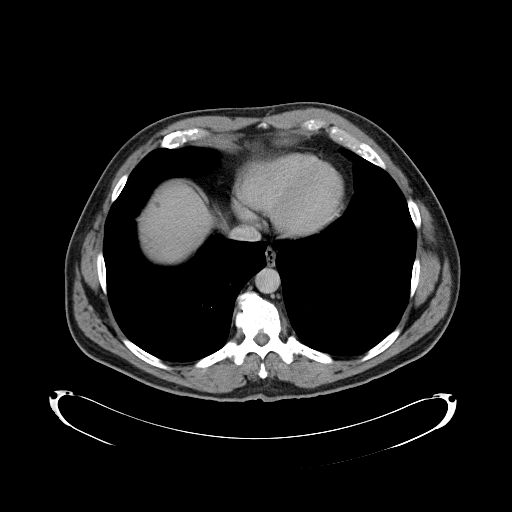
[im 91/137  lung]
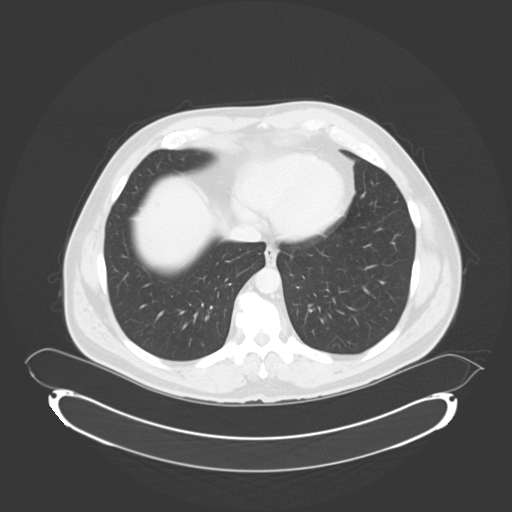

[Series 3: lung · axial · 0.98mm/px · z∈[-241,-141]mm · 2 of 152 slices shown]
[im 51/152  bone]
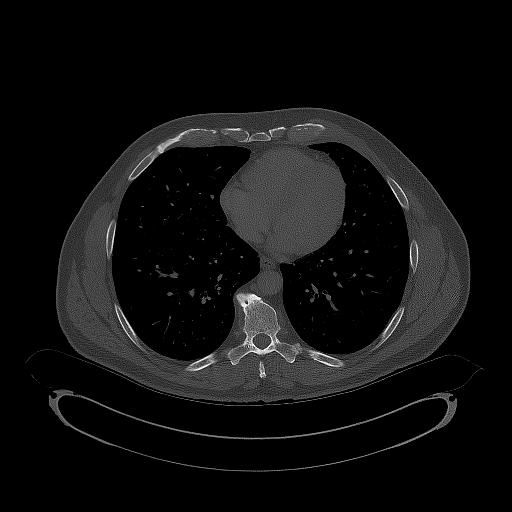
[im 101/152  bone]
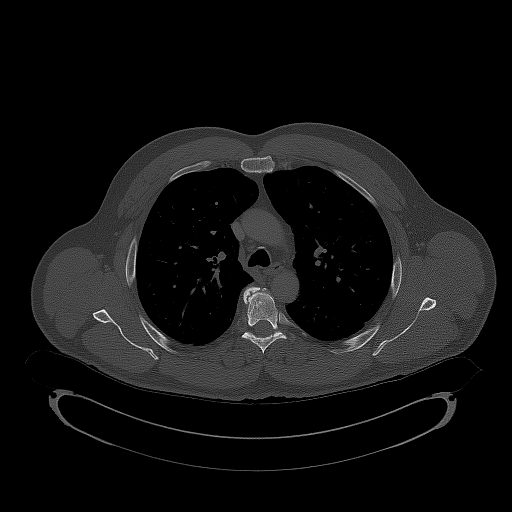

[Series 6: super d chest · axial · 0.98mm/px · z∈[-644,-115]mm · 8 of 681 slices shown]
[im 76/681  mediastinal]
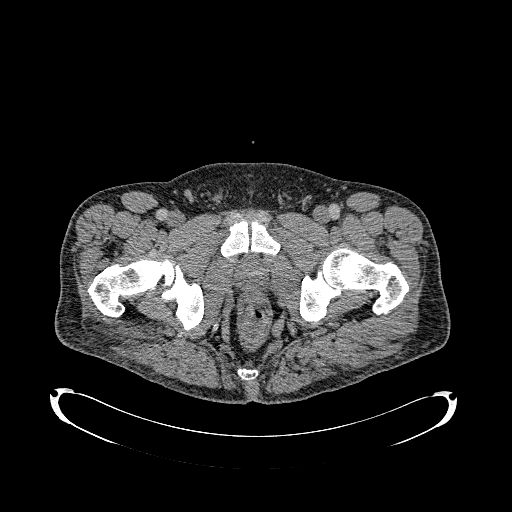
[im 152/681  mediastinal]
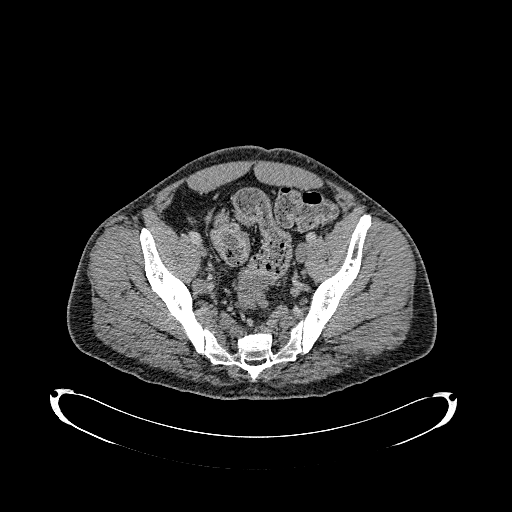
[im 227/681  mediastinal]
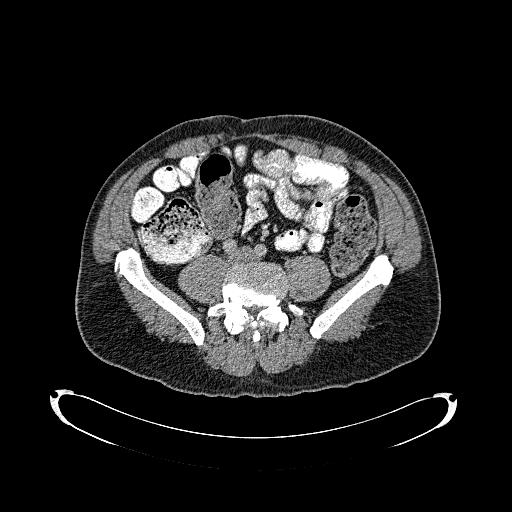
[im 303/681  mediastinal]
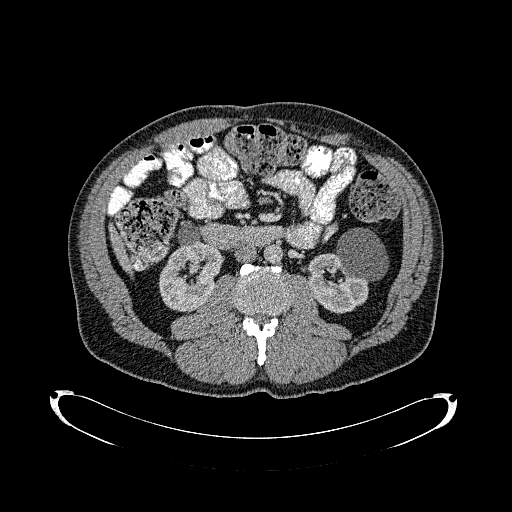
[im 378/681  mediastinal]
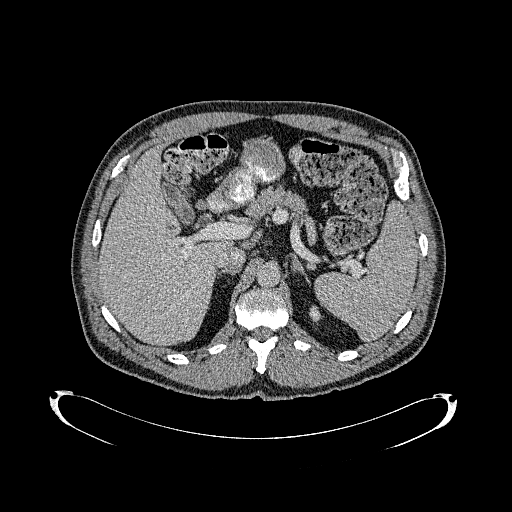
[im 454/681  mediastinal]
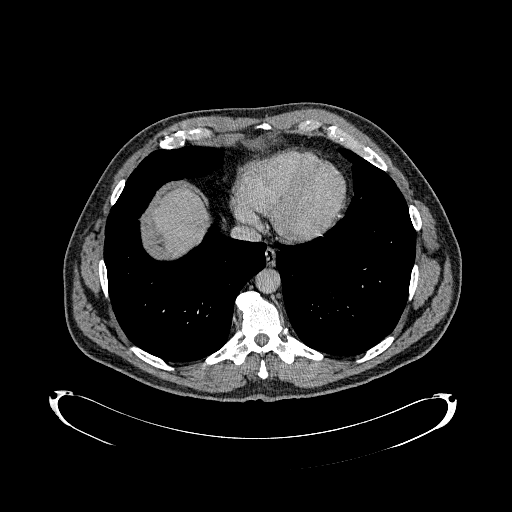
[im 529/681  mediastinal]
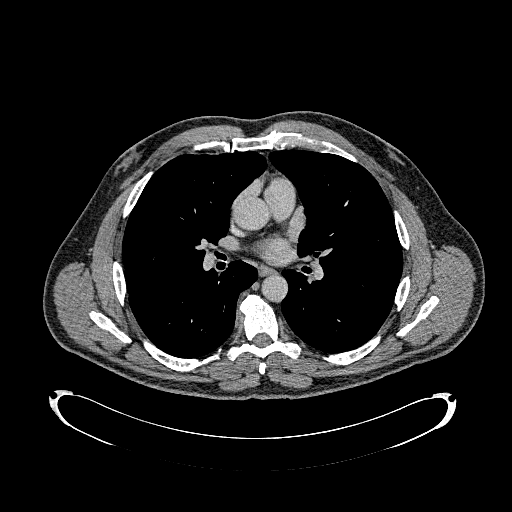
[im 605/681  mediastinal]
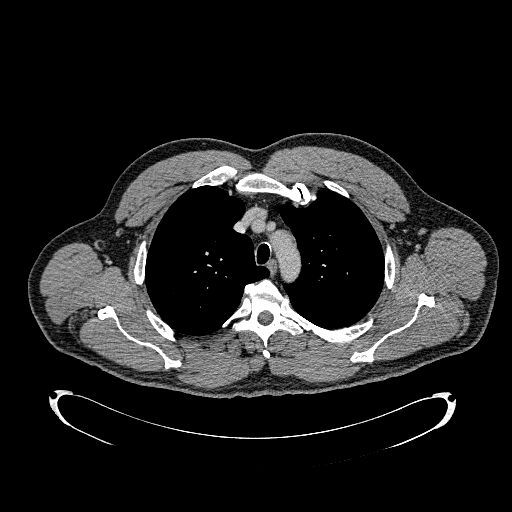

[13 of 31 positions shown; findings below may reference images not displayed]

RADIATION DOSE REDUCTION: This exam was performed according to the
departmental dose-optimization program which includes automated
exposure control, adjustment of the mA and/or kV according to
patient size and/or use of iterative reconstruction technique.

CONTRAST:  80mL P50WWA-KSS IOPAMIDOL (P50WWA-KSS) INJECTION 61%
FINDINGS: CT CHEST FINDINGS

Cardiovascular: Heart size is normal. No pericardial effusion
identified. Main pulmonary artery is normal caliber. Thoracic aorta
is normal in course and caliber.

Mediastinum/Nodes: No bulky axillary, hilar or mediastinal
lymphadenopathy identified.

Lungs/Pleura: No focal consolidation or infiltrates identified in
the lungs. 4 mm triangular flattened benign-appearing nodule in the
left upper lobe image 104. No pleural effusion or pneumothorax.

Musculoskeletal: No chest wall mass or suspicious bone lesions
identified.

CT ABDOMEN PELVIS FINDINGS

Hepatobiliary: Liver is normal in size and contour. There are
numerous subcentimeter hepatic hypodensities scattered throughout
the liver measuring up to 9 mm in size which are too small to
characterize. Cholelithiasis with no wall thickening or
pericholecystic edema. No significant biliary ductal dilatation.

Pancreas: Unremarkable. No pancreatic ductal dilatation or
surrounding inflammatory changes.

Spleen: Enlarged measuring 14 cm in length.

Adrenals/Urinary Tract: Adrenal glands appear normal. A few renal
cortical cysts identified bilaterally measuring up to 3.4 cm
anteriorly on the right and 5.5 cm anteriorly on the left. No
hydronephrosis or enhancing renal mass visualized. Urinary bladder
appears within normal limits.

Stomach/Bowel: No bowel obstruction, free air or pneumatosis. There
is a large amount of retained fecal material throughout the colon.
Approximately 7.2 cm segment of masslike circumferential wall
thickening identified at the distal sigmoid colon with mild adjacent
fat stranding. Appendix not visualized.

Vascular/Lymphatic: A few mildly enlarged pericolonic lymph nodes
adjacent to the distal sigmoid colon mass measuring up to 11 mm.

Reproductive: Prostate is unremarkable.

Other: No ascites.

Musculoskeletal: No suspicious bony lesions identified.
IMPRESSION: 1. Segment of masslike circumferential wall thickening identified of
the distal sigmoid colon which is suspicious for
neoplasm/malignancy. Correlate with colonoscopy results.
2. Accompanying mildly enlarged pericolonic lymph nodes at the
distal sigmoid, which may be metastatic.
3. Numerous subcentimeter hepatic hypodensities throughout the liver
which are too small to characterize. Most likely represent small
cysts and/or hemangiomas, however given the colonic mass an MRI
abdomen with contrast could be considered for confirmation.
4. Splenomegaly.
5. Other ancillary findings as described.

## 2022-07-14 ENCOUNTER — Other Ambulatory Visit: Payer: Self-pay

## 2022-07-14 ENCOUNTER — Ambulatory Visit
Admission: RE | Admit: 2022-07-14 | Discharge: 2022-07-14 | Disposition: A | Discharge status: Home / Self Care | Payer: BC Managed Care – PPO | Source: Ambulatory Visit | Attending: Radiation Oncology | Admitting: Radiation Oncology

## 2022-07-14 ENCOUNTER — Inpatient Hospital Stay: Payer: BC Managed Care – PPO

## 2022-07-14 VITALS — BP 116/97 | HR 66 | Temp 98.1°F | Resp 20 | Ht 71.0 in | Wt 197.0 lb

## 2022-07-14 DIAGNOSIS — L409 Psoriasis, unspecified: Secondary | ICD-10-CM | POA: Diagnosis not present

## 2022-07-14 DIAGNOSIS — R110 Nausea: Secondary | ICD-10-CM | POA: Diagnosis not present

## 2022-07-14 DIAGNOSIS — E871 Hypo-osmolality and hyponatremia: Secondary | ICD-10-CM

## 2022-07-14 DIAGNOSIS — E860 Dehydration: Secondary | ICD-10-CM | POA: Diagnosis not present

## 2022-07-14 DIAGNOSIS — T451X5D Adverse effect of antineoplastic and immunosuppressive drugs, subsequent encounter: Secondary | ICD-10-CM | POA: Diagnosis not present

## 2022-07-14 DIAGNOSIS — Z510 Encounter for antineoplastic radiation therapy: Secondary | ICD-10-CM | POA: Diagnosis not present

## 2022-07-14 DIAGNOSIS — Z452 Encounter for adjustment and management of vascular access device: Secondary | ICD-10-CM | POA: Diagnosis not present

## 2022-07-14 DIAGNOSIS — I10 Essential (primary) hypertension: Secondary | ICD-10-CM | POA: Diagnosis not present

## 2022-07-14 DIAGNOSIS — Z923 Personal history of irradiation: Secondary | ICD-10-CM | POA: Diagnosis not present

## 2022-07-14 DIAGNOSIS — M109 Gout, unspecified: Secondary | ICD-10-CM | POA: Diagnosis not present

## 2022-07-14 DIAGNOSIS — Z95828 Presence of other vascular implants and grafts: Secondary | ICD-10-CM

## 2022-07-14 DIAGNOSIS — D6481 Anemia due to antineoplastic chemotherapy: Secondary | ICD-10-CM | POA: Diagnosis not present

## 2022-07-14 DIAGNOSIS — C20 Malignant neoplasm of rectum: Secondary | ICD-10-CM | POA: Diagnosis not present

## 2022-07-14 DIAGNOSIS — D61810 Antineoplastic chemotherapy induced pancytopenia: Secondary | ICD-10-CM | POA: Diagnosis not present

## 2022-07-14 LAB — RAD ONC ARIA SESSION SUMMARY
Course Elapsed Days: 28
Plan Fractions Treated to Date: 21
Plan Prescribed Dose Per Fraction: 1.8 Gy
Plan Total Fractions Prescribed: 25
Plan Total Prescribed Dose: 45 Gy
Reference Point Dosage Given to Date: 37.8 Gy
Reference Point Session Dosage Given: 1.8 Gy
Session Number: 21

## 2022-07-14 MED ORDER — HEPARIN SOD (PORK) LOCK FLUSH 100 UNIT/ML IV SOLN
500.0000 [IU] | Freq: Once | INTRAVENOUS | Status: AC
  Administered 2022-07-14: 500 [IU] via INTRAVENOUS

## 2022-07-14 MED ORDER — SODIUM CHLORIDE 0.9 % IV SOLN: INTRAVENOUS | Status: AC

## 2022-07-14 MED ORDER — SODIUM CHLORIDE 0.9% FLUSH
10.0000 mL | Freq: Once | INTRAVENOUS | Status: AC
  Administered 2022-07-14: 10 mL via INTRAVENOUS

## 2022-07-14 NOTE — Patient Instructions (Signed)
Rehydration, Adult Rehydration is the replacement of body fluids, salts, and minerals (electrolytes) that are lost during dehydration. Dehydration is when there is not enough water or other fluids in the body. This happens when you lose more fluids than you take in. Common causes of dehydration include: Not drinking enough fluids. This can occur when you are ill or doing activities that require a lot of energy, especially in hot weather. Conditions that cause loss of water or other fluids, such as diarrhea, vomiting, sweating, or urinating a lot. Other illnesses, such as fever or infection. Certain medicines, such as those that remove excess fluid from the body (diuretics). Symptoms of mild or moderate dehydration may include thirst, dry lips and mouth, and dizziness. Symptoms of severe dehydration may include increased heart rate, confusion, fainting, and not urinating. For severe dehydration, you may need to get fluids through an IV at the hospital. For mild or moderate dehydration, you can usually rehydrate at home by drinking certain fluids as told by your health care provider. What are the risks? Generally, rehydration is safe. However, taking in too much fluid (overhydration) can be a problem. This is rare. Overhydration can cause an electrolyte imbalance, kidney failure, or a decrease in salt (sodium) levels in the body. Supplies needed You will need an oral rehydration solution (ORS) if your health care provider tells you to use one. This is a drink to treat dehydration. It can be found in pharmacies and retail stores. How to rehydrate Fluids Follow instructions from your health care provider for rehydration. The kind of fluid and the amount you should drink depend on your condition. In general, you should choose drinks that you prefer. If told by your health care provider, drink an ORS. Make an ORS by following instructions on the package. Start by drinking small amounts, about  cup (120  mL) every 5-10 minutes. Slowly increase how much you drink until you have taken the amount recommended by your health care provider. Drink enough clear fluids to keep your urine pale yellow. If you were told to drink an ORS, finish it first, then start slowly drinking other clear fluids. Drink fluids such as: Water. This includes sparkling water and flavored water. Drinking only water can lead to having too little sodium in your body (hyponatremia). Follow the advice of your health care provider. Water from ice chips you suck on. Fruit juice with water you add to it (diluted). Sports drinks. Hot or cold herbal teas. Broth-based soups. Milk or milk products. Food Follow instructions from your health care provider about what to eat while you rehydrate. Your health care provider may recommend that you slowly begin eating regular foods in small amounts. Eat foods that contain a healthy balance of electrolytes, such as bananas, oranges, potatoes, tomatoes, and spinach. Avoid foods that are greasy or contain a lot of sugar. In some cases, you may get nutrition through a feeding tube that is passed through your nose and into your stomach (nasogastric tube, or NG tube). This may be done if you have uncontrolled vomiting or diarrhea. Beverages to avoid  Certain beverages may make dehydration worse. While you rehydrate, avoid drinking alcohol. How to tell if you are recovering from dehydration You may be recovering from dehydration if: You are urinating more often than before you started rehydrating. Your urine is pale yellow. Your energy level improves. You vomit less frequently. You have diarrhea less frequently. Your appetite improves or returns to normal. You feel less dizzy or less light-headed.   Your skin tone and color start to look more normal. Follow these instructions at home: Take over-the-counter and prescription medicines only as told by your health care provider. Do not take sodium  tablets. Doing this can lead to having too much sodium in your body (hypernatremia). Contact a health care provider if: You continue to have symptoms of mild or moderate dehydration, such as: Thirst. Dry lips. Slightly dry mouth. Dizziness. Dark urine or less urine than normal. Muscle cramps. You continue to vomit or have diarrhea. Get help right away if you: Have symptoms of dehydration that get worse. Have a fever. Have a severe headache. Have been vomiting and the following happens: Your vomiting gets worse or does not go away. Your vomit includes blood or green matter (bile). You cannot eat or drink without vomiting. Have problems with urination or bowel movements, such as: Diarrhea that gets worse or does not go away. Blood in your stool (feces). This may cause stool to look black and tarry. Not urinating, or urinating only a small amount of very dark urine, within 6-8 hours. Have trouble breathing. Have symptoms that get worse with treatment. These symptoms may represent a serious problem that is an emergency. Do not wait to see if the symptoms will go away. Get medical help right away. Call your local emergency services (911 in the [place] Do not drive yourself to the hospital. Summary Rehydration is the replacement of body fluids and minerals (electrolytes) that are lost during dehydration. Follow instructions from your health care provider for rehydration. The kind of fluid and amount you should drink depend on your condition. Slowly increase how much you drink until you have taken the amount recommended by your health care provider. Contact your health care provider if you continue to show signs of mild or moderate dehydration. This information is not intended to replace advice given to you by your health care provider. Make sure you discuss any questions you have with your health care provider. Document Revised: 01/04/2020 Document Reviewed: 11/14/2019 Elsevier Patient  Education  2023 [employer]  

## 2022-07-15 ENCOUNTER — Other Ambulatory Visit: Payer: Self-pay

## 2022-07-15 ENCOUNTER — Ambulatory Visit
Admission: RE | Admit: 2022-07-15 | Discharge: 2022-07-15 | Disposition: A | Discharge status: Home / Self Care | Payer: BC Managed Care – PPO | Source: Ambulatory Visit | Attending: Radiation Oncology | Admitting: Radiation Oncology

## 2022-07-15 ENCOUNTER — Other Ambulatory Visit (HOSPITAL_COMMUNITY): Payer: Self-pay

## 2022-07-15 DIAGNOSIS — L409 Psoriasis, unspecified: Secondary | ICD-10-CM | POA: Diagnosis not present

## 2022-07-15 DIAGNOSIS — Z923 Personal history of irradiation: Secondary | ICD-10-CM | POA: Diagnosis not present

## 2022-07-15 DIAGNOSIS — Z452 Encounter for adjustment and management of vascular access device: Secondary | ICD-10-CM | POA: Diagnosis not present

## 2022-07-15 DIAGNOSIS — D6481 Anemia due to antineoplastic chemotherapy: Secondary | ICD-10-CM | POA: Diagnosis not present

## 2022-07-15 DIAGNOSIS — E860 Dehydration: Secondary | ICD-10-CM | POA: Diagnosis not present

## 2022-07-15 DIAGNOSIS — R110 Nausea: Secondary | ICD-10-CM | POA: Diagnosis not present

## 2022-07-15 DIAGNOSIS — C20 Malignant neoplasm of rectum: Secondary | ICD-10-CM | POA: Diagnosis not present

## 2022-07-15 DIAGNOSIS — T451X5D Adverse effect of antineoplastic and immunosuppressive drugs, subsequent encounter: Secondary | ICD-10-CM | POA: Diagnosis not present

## 2022-07-15 DIAGNOSIS — Z510 Encounter for antineoplastic radiation therapy: Secondary | ICD-10-CM | POA: Diagnosis not present

## 2022-07-15 DIAGNOSIS — D61810 Antineoplastic chemotherapy induced pancytopenia: Secondary | ICD-10-CM | POA: Diagnosis not present

## 2022-07-15 DIAGNOSIS — M109 Gout, unspecified: Secondary | ICD-10-CM | POA: Diagnosis not present

## 2022-07-15 DIAGNOSIS — I10 Essential (primary) hypertension: Secondary | ICD-10-CM | POA: Diagnosis not present

## 2022-07-15 LAB — RAD ONC ARIA SESSION SUMMARY
Course Elapsed Days: 29
Plan Fractions Treated to Date: 22
Plan Prescribed Dose Per Fraction: 1.8 Gy
Plan Total Fractions Prescribed: 25
Plan Total Prescribed Dose: 45 Gy
Reference Point Dosage Given to Date: 39.6 Gy
Reference Point Session Dosage Given: 1.8 Gy
Session Number: 22

## 2022-07-16 ENCOUNTER — Inpatient Hospital Stay: Payer: BC Managed Care – PPO

## 2022-07-16 ENCOUNTER — Other Ambulatory Visit: Payer: Self-pay | Admitting: Oncology

## 2022-07-16 ENCOUNTER — Ambulatory Visit
Admission: RE | Admit: 2022-07-16 | Discharge: 2022-07-16 | Disposition: A | Discharge status: Home / Self Care | Payer: BC Managed Care – PPO | Source: Ambulatory Visit | Attending: Radiation Oncology | Admitting: Radiation Oncology

## 2022-07-16 ENCOUNTER — Other Ambulatory Visit: Payer: Self-pay

## 2022-07-16 ENCOUNTER — Encounter: Payer: Self-pay | Admitting: Nurse Practitioner

## 2022-07-16 ENCOUNTER — Inpatient Hospital Stay: Payer: BC Managed Care – PPO | Admitting: Nurse Practitioner

## 2022-07-16 VITALS — BP 120/76 | HR 65 | Temp 97.7°F | Resp 18 | Ht 71.0 in | Wt 197.2 lb

## 2022-07-16 DIAGNOSIS — C20 Malignant neoplasm of rectum: Secondary | ICD-10-CM | POA: Diagnosis not present

## 2022-07-16 DIAGNOSIS — Z510 Encounter for antineoplastic radiation therapy: Secondary | ICD-10-CM | POA: Diagnosis not present

## 2022-07-16 DIAGNOSIS — M109 Gout, unspecified: Secondary | ICD-10-CM | POA: Diagnosis not present

## 2022-07-16 DIAGNOSIS — E860 Dehydration: Secondary | ICD-10-CM | POA: Diagnosis not present

## 2022-07-16 DIAGNOSIS — I10 Essential (primary) hypertension: Secondary | ICD-10-CM | POA: Diagnosis not present

## 2022-07-16 DIAGNOSIS — D61810 Antineoplastic chemotherapy induced pancytopenia: Secondary | ICD-10-CM | POA: Diagnosis not present

## 2022-07-16 DIAGNOSIS — R110 Nausea: Secondary | ICD-10-CM | POA: Diagnosis not present

## 2022-07-16 DIAGNOSIS — E871 Hypo-osmolality and hyponatremia: Secondary | ICD-10-CM

## 2022-07-16 DIAGNOSIS — L409 Psoriasis, unspecified: Secondary | ICD-10-CM | POA: Diagnosis not present

## 2022-07-16 DIAGNOSIS — Z95828 Presence of other vascular implants and grafts: Secondary | ICD-10-CM

## 2022-07-16 DIAGNOSIS — T451X5D Adverse effect of antineoplastic and immunosuppressive drugs, subsequent encounter: Secondary | ICD-10-CM | POA: Diagnosis not present

## 2022-07-16 DIAGNOSIS — D6481 Anemia due to antineoplastic chemotherapy: Secondary | ICD-10-CM | POA: Diagnosis not present

## 2022-07-16 DIAGNOSIS — Z923 Personal history of irradiation: Secondary | ICD-10-CM | POA: Diagnosis not present

## 2022-07-16 DIAGNOSIS — Z452 Encounter for adjustment and management of vascular access device: Secondary | ICD-10-CM | POA: Diagnosis not present

## 2022-07-16 LAB — RAD ONC ARIA SESSION SUMMARY
Course Elapsed Days: 30
Plan Fractions Treated to Date: 23
Plan Prescribed Dose Per Fraction: 1.8 Gy
Plan Total Fractions Prescribed: 25
Plan Total Prescribed Dose: 45 Gy
Reference Point Dosage Given to Date: 41.4 Gy
Reference Point Session Dosage Given: 1.8 Gy
Session Number: 23

## 2022-07-16 LAB — CBC WITH DIFFERENTIAL (CANCER CENTER ONLY)
Abs Immature Granulocytes: 0 10*3/uL (ref 0.00–0.07)
Basophils Absolute: 0 10*3/uL (ref 0.0–0.1)
Basophils Relative: 1 %
Eosinophils Absolute: 0.1 10*3/uL (ref 0.0–0.5)
Eosinophils Relative: 3 %
HCT: 26.7 % — ABNORMAL LOW (ref 39.0–52.0)
Hemoglobin: 9 g/dL — ABNORMAL LOW (ref 13.0–17.0)
Immature Granulocytes: 0 %
Lymphocytes Relative: 13 %
Lymphs Abs: 0.3 10*3/uL — ABNORMAL LOW (ref 0.7–4.0)
MCH: 34.4 pg — ABNORMAL HIGH (ref 26.0–34.0)
MCHC: 33.7 g/dL (ref 30.0–36.0)
MCV: 101.9 fL — ABNORMAL HIGH (ref 80.0–100.0)
Monocytes Absolute: 0.2 10*3/uL (ref 0.1–1.0)
Monocytes Relative: 7 %
Neutro Abs: 1.6 10*3/uL — ABNORMAL LOW (ref 1.7–7.7)
Neutrophils Relative %: 76 %
Platelet Count: 110 10*3/uL — ABNORMAL LOW (ref 150–400)
RBC: 2.62 MIL/uL — ABNORMAL LOW (ref 4.22–5.81)
RDW: 15.5 % (ref 11.5–15.5)
WBC Count: 2.1 10*3/uL — ABNORMAL LOW (ref 4.0–10.5)
nRBC: 0 % (ref 0.0–0.2)

## 2022-07-16 LAB — CMP (CANCER CENTER ONLY)
ALT: 22 U/L (ref 0–44)
AST: 30 U/L (ref 15–41)
Albumin: 3.9 g/dL (ref 3.5–5.0)
Alkaline Phosphatase: 160 U/L — ABNORMAL HIGH (ref 38–126)
Anion gap: 6 (ref 5–15)
BUN: 14 mg/dL (ref 6–20)
CO2: 27 mmol/L (ref 22–32)
Calcium: 9.2 mg/dL (ref 8.9–10.3)
Chloride: 104 mmol/L (ref 98–111)
Creatinine: 1.34 mg/dL — ABNORMAL HIGH (ref 0.61–1.24)
GFR, Estimated: >60 mL/min (ref >60)
Glucose, Bld: 115 mg/dL — ABNORMAL HIGH (ref 70–99)
Potassium: 3.9 mmol/L (ref 3.5–5.1)
Sodium: 137 mmol/L (ref 135–145)
Total Bilirubin: 0.7 mg/dL (ref 0.3–1.2)
Total Protein: 6.2 g/dL — ABNORMAL LOW (ref 6.5–8.1)

## 2022-07-16 LAB — SAMPLE TO BLOOD BANK

## 2022-07-16 LAB — MAGNESIUM: Magnesium: 1.8 mg/dL (ref 1.7–2.4)

## 2022-07-16 MED ORDER — SODIUM CHLORIDE 0.9% FLUSH
10.0000 mL | Freq: Once | INTRAVENOUS | Status: AC
  Administered 2022-07-16: 10 mL via INTRAVENOUS

## 2022-07-16 MED ORDER — SODIUM CHLORIDE 0.9 % IV SOLN: INTRAVENOUS | Status: AC

## 2022-07-16 MED ORDER — HEPARIN SOD (PORK) LOCK FLUSH 100 UNIT/ML IV SOLN
500.0000 [IU] | Freq: Once | INTRAVENOUS | Status: AC
  Administered 2022-07-16: 500 [IU] via INTRAVENOUS

## 2022-07-16 NOTE — Progress Notes (Signed)
  [place] OFFICE PROGRESS NOTE   Diagnosis: Rectal cancer  INTERVAL HISTORY:   Mr. Markin returns as scheduled.  He continues concurrent capecitabine and radiation.  He is here today receiving IV fluids.  He had an episode of slight nausea.  No mouth sores.  No increase in ostomy output.  No hand or foot pain or redness.  He would like to continue IV fluids on a Tuesday/Friday schedule.  Objective:  Vital signs in last 24 hours:  Temperature 97.7, heart rate 68, respirations 18 blood pressure 120/76, oxygen saturation 100%, weight 197.3 pounds    HEENT: No thrush or ulcers. Resp: Lungs clear bilaterally. Cardio: Regular rate and rhythm. GI: No hepatomegaly.  Right abdomen ileostomy. Vascular: No leg edema. Skin: Palms and soles without erythema.  Soles appear mildly dry.  Dry rash over the forearms. Port-A-Cath without erythema.  Lab Results:  Lab Results  Component Value Date   WBC 2.1 (L) 07/16/2022   HGB 9.0 (L) 07/16/2022   HCT 26.7 (L) 07/16/2022   MCV 101.9 (H) 07/16/2022   PLT 110 (L) 07/16/2022   NEUTROABS 1.6 (L) 07/16/2022    Imaging:  No results found.  Medications: I have reviewed the patient's current medications.  Assessment/Plan: Rectal cancer-mass at 11 cm on proctoscopy by Dr. Morton Stall 02/04/2022 Colonoscopy 12/26/2021-obstructing mass at the rectosigmoid measured at 15 cm, biopsy invasive moderate to poorly differentiated adenocarcinoma, mismatch repair protein expression intact CTs 12/31/2021-masslike circumferential thickening of the distal sigmoid colon with mildly enlarged pericolonic lymph nodes, numerous subcentimeter hypodense liver lesions, splenomegaly MRI abdomen 01/07/2022-innumerable T2 hyperintense liver lesions consistent with cysts, spleen unremarkable, no ascites or adenopathy, moderate colonic fecal retention, no bowel obstruction MRI pelvis 01/22/2022-T3cN2 tumor above and below the peritoneal reflection, tumor 11.3 cm from  the anal verge and 6.1 cm from the sphincter complex, multiple enlarged perirectal nodes, 1.1 cm node versus tumor deposit at the right aspect of the tumor Diverting loop ileostomy 01/15/2022 Cycle 1 FOLFOX 02/17/2022 Cycle 2 FOLFOX 03/04/2022 Cycle 3 FOLFOX held on 03/17/2022 due to neutropenia Cycle 3 FOLFOX 03/17/2022, Udenyca Treatment held 03/31/2022 due to dehydration Cycle 4 FOLFOX 04/07/2022 Cycle 5 FOLFOX 04/22/2022, oxaliplatin dose reduced due to thrombocytopenia Cycle 6 FOLFOX 05/05/2022 Cycle 7 FOLFOX 05/19/2022 Cycle 8 FOLFOX held secondary to patient preference and toxicity (diarrhea/weight loss/neuropathy symptoms) 06/16/2022 radiation/Xeloda Gout Psoriasis Hypertension Left scalene node versus cyst/lipoma on exam 02/03/2022-CT neck 02/10/2022 with 9.5 mm lymph node in the left supraclavicular region posterior to the sternocleidomastoid muscle.  No other prominent lymph nodes in the neck.  Biopsy 02/12/2022 compatible with benign lymph node, negative for metastatic carcinoma.    Disposition: Mr. Guzzo appears stable.  He will complete the course of radiation/Xeloda 07/24/2022.  He is scheduled for a restaging MRI and follow-up visit with Dr. Morton Stall next month.  He has a high output ileostomy.  Plan to continue IV fluids on a Tuesday/Friday schedule.  He will return for follow-up here in approximately 3 weeks.    [name] ANP/GNP-BC   07/16/2022  1:11 PM

## 2022-07-16 NOTE — Patient Instructions (Signed)

## 2022-07-17 ENCOUNTER — Ambulatory Visit
Admission: RE | Admit: 2022-07-17 | Discharge: 2022-07-17 | Disposition: A | Discharge status: Home / Self Care | Payer: BC Managed Care – PPO | Source: Ambulatory Visit | Attending: Radiation Oncology | Admitting: Radiation Oncology

## 2022-07-17 ENCOUNTER — Other Ambulatory Visit: Payer: Self-pay

## 2022-07-17 DIAGNOSIS — D61810 Antineoplastic chemotherapy induced pancytopenia: Secondary | ICD-10-CM | POA: Diagnosis not present

## 2022-07-17 DIAGNOSIS — M109 Gout, unspecified: Secondary | ICD-10-CM | POA: Diagnosis not present

## 2022-07-17 DIAGNOSIS — Z923 Personal history of irradiation: Secondary | ICD-10-CM | POA: Diagnosis not present

## 2022-07-17 DIAGNOSIS — Z452 Encounter for adjustment and management of vascular access device: Secondary | ICD-10-CM | POA: Diagnosis not present

## 2022-07-17 DIAGNOSIS — Z510 Encounter for antineoplastic radiation therapy: Secondary | ICD-10-CM | POA: Diagnosis not present

## 2022-07-17 DIAGNOSIS — C20 Malignant neoplasm of rectum: Secondary | ICD-10-CM | POA: Diagnosis not present

## 2022-07-17 DIAGNOSIS — E860 Dehydration: Secondary | ICD-10-CM | POA: Diagnosis not present

## 2022-07-17 DIAGNOSIS — D6481 Anemia due to antineoplastic chemotherapy: Secondary | ICD-10-CM | POA: Diagnosis not present

## 2022-07-17 DIAGNOSIS — I10 Essential (primary) hypertension: Secondary | ICD-10-CM | POA: Diagnosis not present

## 2022-07-17 DIAGNOSIS — L409 Psoriasis, unspecified: Secondary | ICD-10-CM | POA: Diagnosis not present

## 2022-07-17 DIAGNOSIS — T451X5D Adverse effect of antineoplastic and immunosuppressive drugs, subsequent encounter: Secondary | ICD-10-CM | POA: Diagnosis not present

## 2022-07-17 DIAGNOSIS — R110 Nausea: Secondary | ICD-10-CM | POA: Diagnosis not present

## 2022-07-17 LAB — RAD ONC ARIA SESSION SUMMARY
Course Elapsed Days: 31
Plan Fractions Treated to Date: 24
Plan Prescribed Dose Per Fraction: 1.8 Gy
Plan Total Fractions Prescribed: 25
Plan Total Prescribed Dose: 45 Gy
Reference Point Dosage Given to Date: 43.2 Gy
Reference Point Session Dosage Given: 1.8 Gy
Session Number: 24

## 2022-07-18 ENCOUNTER — Ambulatory Visit
Admission: RE | Admit: 2022-07-18 | Discharge: 2022-07-18 | Disposition: A | Discharge status: Home / Self Care | Payer: BC Managed Care – PPO | Source: Ambulatory Visit | Attending: Radiation Oncology | Admitting: Radiation Oncology

## 2022-07-18 ENCOUNTER — Other Ambulatory Visit: Payer: Self-pay

## 2022-07-18 ENCOUNTER — Inpatient Hospital Stay: Payer: BC Managed Care – PPO

## 2022-07-18 ENCOUNTER — Inpatient Hospital Stay: Payer: BC Managed Care – PPO | Admitting: Oncology

## 2022-07-18 ENCOUNTER — Inpatient Hospital Stay: Payer: BC Managed Care – PPO | Attending: Oncology

## 2022-07-18 VITALS — BP 112/72 | HR 65 | Temp 98.0°F | Resp 18 | Ht 71.0 in | Wt 199.4 lb

## 2022-07-18 DIAGNOSIS — Z5189 Encounter for other specified aftercare: Secondary | ICD-10-CM | POA: Diagnosis not present

## 2022-07-18 DIAGNOSIS — M109 Gout, unspecified: Secondary | ICD-10-CM | POA: Diagnosis not present

## 2022-07-18 DIAGNOSIS — R634 Abnormal weight loss: Secondary | ICD-10-CM | POA: Insufficient documentation

## 2022-07-18 DIAGNOSIS — Z5111 Encounter for antineoplastic chemotherapy: Secondary | ICD-10-CM | POA: Diagnosis not present

## 2022-07-18 DIAGNOSIS — G620 Drug-induced polyneuropathy: Secondary | ICD-10-CM | POA: Insufficient documentation

## 2022-07-18 DIAGNOSIS — Z95828 Presence of other vascular implants and grafts: Secondary | ICD-10-CM

## 2022-07-18 DIAGNOSIS — Z510 Encounter for antineoplastic radiation therapy: Secondary | ICD-10-CM | POA: Diagnosis not present

## 2022-07-18 DIAGNOSIS — L409 Psoriasis, unspecified: Secondary | ICD-10-CM | POA: Insufficient documentation

## 2022-07-18 DIAGNOSIS — C20 Malignant neoplasm of rectum: Secondary | ICD-10-CM | POA: Diagnosis not present

## 2022-07-18 DIAGNOSIS — T451X5A Adverse effect of antineoplastic and immunosuppressive drugs, initial encounter: Secondary | ICD-10-CM | POA: Insufficient documentation

## 2022-07-18 DIAGNOSIS — E871 Hypo-osmolality and hyponatremia: Secondary | ICD-10-CM

## 2022-07-18 DIAGNOSIS — Z923 Personal history of irradiation: Secondary | ICD-10-CM | POA: Diagnosis not present

## 2022-07-18 DIAGNOSIS — I10 Essential (primary) hypertension: Secondary | ICD-10-CM | POA: Diagnosis not present

## 2022-07-18 LAB — RAD ONC ARIA SESSION SUMMARY
Course Elapsed Days: 32
Plan Fractions Treated to Date: 25
Plan Prescribed Dose Per Fraction: 1.8 Gy
Plan Total Fractions Prescribed: 25
Plan Total Prescribed Dose: 45 Gy
Reference Point Dosage Given to Date: 45 Gy
Reference Point Session Dosage Given: 1.8 Gy
Session Number: 25

## 2022-07-18 MED ORDER — HEPARIN SOD (PORK) LOCK FLUSH 100 UNIT/ML IV SOLN
500.0000 [IU] | Freq: Once | INTRAVENOUS | Status: AC
  Administered 2022-07-18: 500 [IU] via INTRAVENOUS

## 2022-07-18 MED ORDER — SODIUM CHLORIDE 0.9% FLUSH
10.0000 mL | Freq: Once | INTRAVENOUS | Status: AC
  Administered 2022-07-18: 10 mL via INTRAVENOUS

## 2022-07-18 MED ORDER — SODIUM CHLORIDE 0.9 % IV SOLN: INTRAVENOUS | Status: AC

## 2022-07-18 NOTE — Patient Instructions (Signed)
Rehydration, Adult Rehydration is the replacement of body fluids, salts, and minerals (electrolytes) that are lost during dehydration. Dehydration is when there is not enough water or other fluids in the body. This happens when you lose more fluids than you take in. Common causes of dehydration include: Not drinking enough fluids. This can occur when you are ill or doing activities that require a lot of energy, especially in hot weather. Conditions that cause loss of water or other fluids, such as diarrhea, vomiting, sweating, or urinating a lot. Other illnesses, such as fever or infection. Certain medicines, such as those that remove excess fluid from the body (diuretics). Symptoms of mild or moderate dehydration may include thirst, dry lips and mouth, and dizziness. Symptoms of severe dehydration may include increased heart rate, confusion, fainting, and not urinating. For severe dehydration, you may need to get fluids through an IV at the hospital. For mild or moderate dehydration, you can usually rehydrate at home by drinking certain fluids as told by your health care provider. What are the risks? Generally, rehydration is safe. However, taking in too much fluid (overhydration) can be a problem. This is rare. Overhydration can cause an electrolyte imbalance, kidney failure, or a decrease in salt (sodium) levels in the body. Supplies needed You will need an oral rehydration solution (ORS) if your health care provider tells you to use one. This is a drink to treat dehydration. It can be found in pharmacies and retail stores. How to rehydrate Fluids Follow instructions from your health care provider for rehydration. The kind of fluid and the amount you should drink depend on your condition. In general, you should choose drinks that you prefer. If told by your health care provider, drink an ORS. Make an ORS by following instructions on the package. Start by drinking small amounts, about  cup (120  mL) every 5-10 minutes. Slowly increase how much you drink until you have taken the amount recommended by your health care provider. Drink enough clear fluids to keep your urine pale yellow. If you were told to drink an ORS, finish it first, then start slowly drinking other clear fluids. Drink fluids such as: Water. This includes sparkling water and flavored water. Drinking only water can lead to having too little sodium in your body (hyponatremia). Follow the advice of your health care provider. Water from ice chips you suck on. Fruit juice with water you add to it (diluted). Sports drinks. Hot or cold herbal teas. Broth-based soups. Milk or milk products. Food Follow instructions from your health care provider about what to eat while you rehydrate. Your health care provider may recommend that you slowly begin eating regular foods in small amounts. Eat foods that contain a healthy balance of electrolytes, such as bananas, oranges, potatoes, tomatoes, and spinach. Avoid foods that are greasy or contain a lot of sugar. In some cases, you may get nutrition through a feeding tube that is passed through your nose and into your stomach (nasogastric tube, or NG tube). This may be done if you have uncontrolled vomiting or diarrhea. Beverages to avoid  Certain beverages may make dehydration worse. While you rehydrate, avoid drinking alcohol. How to tell if you are recovering from dehydration You may be recovering from dehydration if: You are urinating more often than before you started rehydrating. Your urine is pale yellow. Your energy level improves. You vomit less frequently. You have diarrhea less frequently. Your appetite improves or returns to normal. You feel less dizzy or less light-headed.   Your skin tone and color start to look more normal. Follow these instructions at home: Take over-the-counter and prescription medicines only as told by your health care provider. Do not take sodium  tablets. Doing this can lead to having too much sodium in your body (hypernatremia). Contact a health care provider if: You continue to have symptoms of mild or moderate dehydration, such as: Thirst. Dry lips. Slightly dry mouth. Dizziness. Dark urine or less urine than normal. Muscle cramps. You continue to vomit or have diarrhea. Get help right away if you: Have symptoms of dehydration that get worse. Have a fever. Have a severe headache. Have been vomiting and the following happens: Your vomiting gets worse or does not go away. Your vomit includes blood or green matter (bile). You cannot eat or drink without vomiting. Have problems with urination or bowel movements, such as: Diarrhea that gets worse or does not go away. Blood in your stool (feces). This may cause stool to look black and tarry. Not urinating, or urinating only a small amount of very dark urine, within 6-8 hours. Have trouble breathing. Have symptoms that get worse with treatment. These symptoms may represent a serious problem that is an emergency. Do not wait to see if the symptoms will go away. Get medical help right away. Call your local emergency services (911 in the [place] Do not drive yourself to the hospital. Summary Rehydration is the replacement of body fluids and minerals (electrolytes) that are lost during dehydration. Follow instructions from your health care provider for rehydration. The kind of fluid and amount you should drink depend on your condition. Slowly increase how much you drink until you have taken the amount recommended by your health care provider. Contact your health care provider if you continue to show signs of mild or moderate dehydration. This information is not intended to replace advice given to you by your health care provider. Make sure you discuss any questions you have with your health care provider. Document Revised: 01/04/2020 Document Reviewed: 11/14/2019 Elsevier Patient  Education  2023 [employer]  

## 2022-07-22 ENCOUNTER — Other Ambulatory Visit: Payer: Self-pay

## 2022-07-22 ENCOUNTER — Ambulatory Visit
Admission: RE | Admit: 2022-07-22 | Discharge: 2022-07-22 | Disposition: A | Discharge status: Home / Self Care | Payer: BC Managed Care – PPO | Source: Ambulatory Visit | Attending: Radiation Oncology | Admitting: Radiation Oncology

## 2022-07-22 DIAGNOSIS — Z5189 Encounter for other specified aftercare: Secondary | ICD-10-CM | POA: Diagnosis not present

## 2022-07-22 DIAGNOSIS — Z5111 Encounter for antineoplastic chemotherapy: Secondary | ICD-10-CM | POA: Diagnosis not present

## 2022-07-22 DIAGNOSIS — Z510 Encounter for antineoplastic radiation therapy: Secondary | ICD-10-CM | POA: Diagnosis not present

## 2022-07-22 DIAGNOSIS — R634 Abnormal weight loss: Secondary | ICD-10-CM | POA: Diagnosis not present

## 2022-07-22 DIAGNOSIS — C20 Malignant neoplasm of rectum: Secondary | ICD-10-CM | POA: Diagnosis not present

## 2022-07-22 DIAGNOSIS — T451X5A Adverse effect of antineoplastic and immunosuppressive drugs, initial encounter: Secondary | ICD-10-CM | POA: Diagnosis not present

## 2022-07-22 DIAGNOSIS — L409 Psoriasis, unspecified: Secondary | ICD-10-CM | POA: Diagnosis not present

## 2022-07-22 DIAGNOSIS — E871 Hypo-osmolality and hyponatremia: Secondary | ICD-10-CM

## 2022-07-22 DIAGNOSIS — Z923 Personal history of irradiation: Secondary | ICD-10-CM | POA: Diagnosis not present

## 2022-07-22 DIAGNOSIS — G620 Drug-induced polyneuropathy: Secondary | ICD-10-CM | POA: Diagnosis not present

## 2022-07-22 DIAGNOSIS — I10 Essential (primary) hypertension: Secondary | ICD-10-CM | POA: Diagnosis not present

## 2022-07-22 DIAGNOSIS — M109 Gout, unspecified: Secondary | ICD-10-CM | POA: Diagnosis not present

## 2022-07-22 LAB — RAD ONC ARIA SESSION SUMMARY
Course Elapsed Days: 36
Plan Fractions Treated to Date: 1
Plan Prescribed Dose Per Fraction: 1.8 Gy
Plan Total Fractions Prescribed: 3
Plan Total Prescribed Dose: 5.4 Gy
Reference Point Dosage Given to Date: 1.8 Gy
Reference Point Session Dosage Given: 1.8 Gy
Session Number: 26

## 2022-07-22 NOTE — Progress Notes (Signed)
                                                                                                                                                             Patient Name: Kenneth [name] MRN: [id number] DOB: 01/07/1961 Referring Physician: Betsy Coder (Profile Not Attached) Date of Service: 07/24/2022 [facility], [place]                                                        End Of Treatment Note  Diagnoses: C20-Malignant neoplasm of rectum  Cancer Staging: Stage IIIC, cT3N2M0, adenocarcinoma of the rectum  Intent: Curative  Radiation Treatment Dates: 06/16/2022 through 07/24/2022 Site Technique Total Dose (Gy) Dose per Fx (Gy) Completed Fx Beam Energies  Rectum: Rectum 3D 45/45 1.8 25/25 10X  Rectum: Rectum_Bst 3D 5.4/5.4 1.8 3/3 10X   Narrative: The patient tolerated radiation therapy relatively well. He developed fatigue during radiation and using imodium and lomotil to slow his stools.   Plan: The patient will receive a call in about one month from the radiation oncology department. He will continue follow up with Dr. [name] as well as Dr. Morton Stall at [facility].   ________________________________________________    Carola Rhine, [facility]

## 2022-07-23 ENCOUNTER — Other Ambulatory Visit (HOSPITAL_BASED_OUTPATIENT_CLINIC_OR_DEPARTMENT_OTHER): Payer: Self-pay

## 2022-07-23 ENCOUNTER — Inpatient Hospital Stay: Payer: BC Managed Care – PPO

## 2022-07-23 ENCOUNTER — Other Ambulatory Visit: Payer: Self-pay | Admitting: Nurse Practitioner

## 2022-07-23 ENCOUNTER — Ambulatory Visit
Admission: RE | Admit: 2022-07-23 | Discharge: 2022-07-23 | Disposition: A | Discharge status: Home / Self Care | Payer: BC Managed Care – PPO | Source: Ambulatory Visit | Attending: Radiation Oncology | Admitting: Radiation Oncology

## 2022-07-23 ENCOUNTER — Other Ambulatory Visit: Payer: Self-pay

## 2022-07-23 VITALS — BP 111/73 | HR 66

## 2022-07-23 DIAGNOSIS — Z923 Personal history of irradiation: Secondary | ICD-10-CM | POA: Diagnosis not present

## 2022-07-23 DIAGNOSIS — G620 Drug-induced polyneuropathy: Secondary | ICD-10-CM | POA: Diagnosis not present

## 2022-07-23 DIAGNOSIS — Z95828 Presence of other vascular implants and grafts: Secondary | ICD-10-CM

## 2022-07-23 DIAGNOSIS — C20 Malignant neoplasm of rectum: Secondary | ICD-10-CM

## 2022-07-23 DIAGNOSIS — Z5189 Encounter for other specified aftercare: Secondary | ICD-10-CM | POA: Diagnosis not present

## 2022-07-23 DIAGNOSIS — Z5111 Encounter for antineoplastic chemotherapy: Secondary | ICD-10-CM | POA: Diagnosis not present

## 2022-07-23 DIAGNOSIS — L409 Psoriasis, unspecified: Secondary | ICD-10-CM | POA: Diagnosis not present

## 2022-07-23 DIAGNOSIS — M109 Gout, unspecified: Secondary | ICD-10-CM | POA: Diagnosis not present

## 2022-07-23 DIAGNOSIS — E876 Hypokalemia: Secondary | ICD-10-CM

## 2022-07-23 DIAGNOSIS — E871 Hypo-osmolality and hyponatremia: Secondary | ICD-10-CM

## 2022-07-23 DIAGNOSIS — R634 Abnormal weight loss: Secondary | ICD-10-CM | POA: Diagnosis not present

## 2022-07-23 DIAGNOSIS — T451X5A Adverse effect of antineoplastic and immunosuppressive drugs, initial encounter: Secondary | ICD-10-CM | POA: Diagnosis not present

## 2022-07-23 DIAGNOSIS — I10 Essential (primary) hypertension: Secondary | ICD-10-CM | POA: Diagnosis not present

## 2022-07-23 LAB — CBC WITH DIFFERENTIAL (CANCER CENTER ONLY)
Abs Immature Granulocytes: 0.01 10*3/uL (ref 0.00–0.07)
Basophils Absolute: 0 10*3/uL (ref 0.0–0.1)
Basophils Relative: 0 %
Eosinophils Absolute: 0.1 10*3/uL (ref 0.0–0.5)
Eosinophils Relative: 3 %
HCT: 28.7 % — ABNORMAL LOW (ref 39.0–52.0)
Hemoglobin: 9.7 g/dL — ABNORMAL LOW (ref 13.0–17.0)
Immature Granulocytes: 0 %
Lymphocytes Relative: 11 %
Lymphs Abs: 0.3 10*3/uL — ABNORMAL LOW (ref 0.7–4.0)
MCH: 34.4 pg — ABNORMAL HIGH (ref 26.0–34.0)
MCHC: 33.8 g/dL (ref 30.0–36.0)
MCV: 101.8 fL — ABNORMAL HIGH (ref 80.0–100.0)
Monocytes Absolute: 0.3 10*3/uL (ref 0.1–1.0)
Monocytes Relative: 11 %
Neutro Abs: 1.9 10*3/uL (ref 1.7–7.7)
Neutrophils Relative %: 75 %
Platelet Count: 134 10*3/uL — ABNORMAL LOW (ref 150–400)
RBC: 2.82 MIL/uL — ABNORMAL LOW (ref 4.22–5.81)
RDW: 16.4 % — ABNORMAL HIGH (ref 11.5–15.5)
WBC Count: 2.6 10*3/uL — ABNORMAL LOW (ref 4.0–10.5)
nRBC: 0 % (ref 0.0–0.2)

## 2022-07-23 LAB — RAD ONC ARIA SESSION SUMMARY
Course Elapsed Days: 37
Plan Fractions Treated to Date: 2
Plan Prescribed Dose Per Fraction: 1.8 Gy
Plan Total Fractions Prescribed: 3
Plan Total Prescribed Dose: 5.4 Gy
Reference Point Dosage Given to Date: 3.6 Gy
Reference Point Session Dosage Given: 1.8 Gy
Session Number: 27

## 2022-07-23 LAB — CMP (CANCER CENTER ONLY)
ALT: 22 U/L (ref 0–44)
AST: 30 U/L (ref 15–41)
Albumin: 4.1 g/dL (ref 3.5–5.0)
Alkaline Phosphatase: 150 U/L — ABNORMAL HIGH (ref 38–126)
Anion gap: 6 (ref 5–15)
BUN: 17 mg/dL (ref 6–20)
CO2: 27 mmol/L (ref 22–32)
Calcium: 8.9 mg/dL (ref 8.9–10.3)
Chloride: 104 mmol/L (ref 98–111)
Creatinine: 1.15 mg/dL (ref 0.61–1.24)
GFR, Estimated: >60 mL/min (ref >60)
Glucose, Bld: 127 mg/dL — ABNORMAL HIGH (ref 70–99)
Potassium: 4 mmol/L (ref 3.5–5.1)
Sodium: 137 mmol/L (ref 135–145)
Total Bilirubin: 0.7 mg/dL (ref 0.3–1.2)
Total Protein: 6.7 g/dL (ref 6.5–8.1)

## 2022-07-23 MED ORDER — SODIUM CHLORIDE 0.9 % IV SOLN: Freq: Once | INTRAVENOUS | Status: AC

## 2022-07-23 MED ORDER — POTASSIUM CHLORIDE CRYS ER 20 MEQ PO TBCR
20.0000 meq | EXTENDED_RELEASE_TABLET | Freq: Every day | ORAL | 3 refills | Status: DC
  Filled 2022-07-23: qty 30, 30d supply, fill #0
  Filled 2022-08-24: qty 30, 30d supply, fill #1

## 2022-07-23 MED ORDER — SODIUM CHLORIDE 0.9% FLUSH
10.0000 mL | Freq: Once | INTRAVENOUS | Status: AC
  Administered 2022-07-23: 10 mL via INTRAVENOUS

## 2022-07-23 MED ORDER — HEPARIN SOD (PORK) LOCK FLUSH 100 UNIT/ML IV SOLN
500.0000 [IU] | Freq: Once | INTRAVENOUS | Status: AC
  Administered 2022-07-23: 500 [IU] via INTRAVENOUS

## 2022-07-23 NOTE — Patient Instructions (Signed)
Rehydration, Adult Rehydration is the replacement of body fluids, salts, and minerals (electrolytes) that are lost during dehydration. Dehydration is when there is not enough water or other fluids in the body. This happens when you lose more fluids than you take in. Common causes of dehydration include: Not drinking enough fluids. This can occur when you are ill or doing activities that require a lot of energy, especially in hot weather. Conditions that cause loss of water or other fluids, such as diarrhea, vomiting, sweating, or urinating a lot. Other illnesses, such as fever or infection. Certain medicines, such as those that remove excess fluid from the body (diuretics). Symptoms of mild or moderate dehydration may include thirst, dry lips and mouth, and dizziness. Symptoms of severe dehydration may include increased heart rate, confusion, fainting, and not urinating. For severe dehydration, you may need to get fluids through an IV at the hospital. For mild or moderate dehydration, you can usually rehydrate at home by drinking certain fluids as told by your health care provider. What are the risks? Generally, rehydration is safe. However, taking in too much fluid (overhydration) can be a problem. This is rare. Overhydration can cause an electrolyte imbalance, kidney failure, or a decrease in salt (sodium) levels in the body. Supplies needed You will need an oral rehydration solution (ORS) if your health care provider tells you to use one. This is a drink to treat dehydration. It can be found in pharmacies and retail stores. How to rehydrate Fluids Follow instructions from your health care provider for rehydration. The kind of fluid and the amount you should drink depend on your condition. In general, you should choose drinks that you prefer. If told by your health care provider, drink an ORS. Make an ORS by following instructions on the package. Start by drinking small amounts, about  cup (120  mL) every 5-10 minutes. Slowly increase how much you drink until you have taken the amount recommended by your health care provider. Drink enough clear fluids to keep your urine pale yellow. If you were told to drink an ORS, finish it first, then start slowly drinking other clear fluids. Drink fluids such as: Water. This includes sparkling water and flavored water. Drinking only water can lead to having too little sodium in your body (hyponatremia). Follow the advice of your health care provider. Water from ice chips you suck on. Fruit juice with water you add to it (diluted). Sports drinks. Hot or cold herbal teas. Broth-based soups. Milk or milk products. Food Follow instructions from your health care provider about what to eat while you rehydrate. Your health care provider may recommend that you slowly begin eating regular foods in small amounts. Eat foods that contain a healthy balance of electrolytes, such as bananas, oranges, potatoes, tomatoes, and spinach. Avoid foods that are greasy or contain a lot of sugar. In some cases, you may get nutrition through a feeding tube that is passed through your nose and into your stomach (nasogastric tube, or NG tube). This may be done if you have uncontrolled vomiting or diarrhea. Beverages to avoid  Certain beverages may make dehydration worse. While you rehydrate, avoid drinking alcohol. How to tell if you are recovering from dehydration You may be recovering from dehydration if: You are urinating more often than before you started rehydrating. Your urine is pale yellow. Your energy level improves. You vomit less frequently. You have diarrhea less frequently. Your appetite improves or returns to normal. You feel less dizzy or less light-headed.   Your skin tone and color start to look more normal. Follow these instructions at home: Take over-the-counter and prescription medicines only as told by your health care provider. Do not take sodium  tablets. Doing this can lead to having too much sodium in your body (hypernatremia). Contact a health care provider if: You continue to have symptoms of mild or moderate dehydration, such as: Thirst. Dry lips. Slightly dry mouth. Dizziness. Dark urine or less urine than normal. Muscle cramps. You continue to vomit or have diarrhea. Get help right away if you: Have symptoms of dehydration that get worse. Have a fever. Have a severe headache. Have been vomiting and the following happens: Your vomiting gets worse or does not go away. Your vomit includes blood or green matter (bile). You cannot eat or drink without vomiting. Have problems with urination or bowel movements, such as: Diarrhea that gets worse or does not go away. Blood in your stool (feces). This may cause stool to look black and tarry. Not urinating, or urinating only a small amount of very dark urine, within 6-8 hours. Have trouble breathing. Have symptoms that get worse with treatment. These symptoms may represent a serious problem that is an emergency. Do not wait to see if the symptoms will go away. Get medical help right away. Call your local emergency services (911 in the [place] Do not drive yourself to the hospital. Summary Rehydration is the replacement of body fluids and minerals (electrolytes) that are lost during dehydration. Follow instructions from your health care provider for rehydration. The kind of fluid and amount you should drink depend on your condition. Slowly increase how much you drink until you have taken the amount recommended by your health care provider. Contact your health care provider if you continue to show signs of mild or moderate dehydration. This information is not intended to replace advice given to you by your health care provider. Make sure you discuss any questions you have with your health care provider. Document Revised: 01/04/2020 Document Reviewed: 11/14/2019 Elsevier Patient  Education  2023 [employer]  

## 2022-07-23 NOTE — Patient Instructions (Signed)

## 2022-07-24 ENCOUNTER — Encounter: Payer: Self-pay | Admitting: Radiation Oncology

## 2022-07-24 ENCOUNTER — Other Ambulatory Visit: Payer: Self-pay

## 2022-07-24 ENCOUNTER — Ambulatory Visit
Admission: RE | Admit: 2022-07-24 | Discharge: 2022-07-24 | Disposition: A | Discharge status: Home / Self Care | Payer: BC Managed Care – PPO | Source: Ambulatory Visit | Attending: Radiation Oncology | Admitting: Radiation Oncology

## 2022-07-24 DIAGNOSIS — C20 Malignant neoplasm of rectum: Secondary | ICD-10-CM | POA: Diagnosis not present

## 2022-07-24 DIAGNOSIS — Z923 Personal history of irradiation: Secondary | ICD-10-CM | POA: Diagnosis not present

## 2022-07-24 DIAGNOSIS — T451X5A Adverse effect of antineoplastic and immunosuppressive drugs, initial encounter: Secondary | ICD-10-CM | POA: Diagnosis not present

## 2022-07-24 DIAGNOSIS — R634 Abnormal weight loss: Secondary | ICD-10-CM | POA: Diagnosis not present

## 2022-07-24 DIAGNOSIS — Z510 Encounter for antineoplastic radiation therapy: Secondary | ICD-10-CM | POA: Diagnosis not present

## 2022-07-24 DIAGNOSIS — Z5189 Encounter for other specified aftercare: Secondary | ICD-10-CM | POA: Diagnosis not present

## 2022-07-24 DIAGNOSIS — Z5111 Encounter for antineoplastic chemotherapy: Secondary | ICD-10-CM | POA: Diagnosis not present

## 2022-07-24 DIAGNOSIS — G620 Drug-induced polyneuropathy: Secondary | ICD-10-CM | POA: Diagnosis not present

## 2022-07-24 DIAGNOSIS — L409 Psoriasis, unspecified: Secondary | ICD-10-CM | POA: Diagnosis not present

## 2022-07-24 DIAGNOSIS — M109 Gout, unspecified: Secondary | ICD-10-CM | POA: Diagnosis not present

## 2022-07-24 DIAGNOSIS — I10 Essential (primary) hypertension: Secondary | ICD-10-CM | POA: Diagnosis not present

## 2022-07-24 LAB — RAD ONC ARIA SESSION SUMMARY
Course Elapsed Days: 38
Plan Fractions Treated to Date: 3
Plan Prescribed Dose Per Fraction: 1.8 Gy
Plan Total Fractions Prescribed: 3
Plan Total Prescribed Dose: 5.4 Gy
Reference Point Dosage Given to Date: 5.4 Gy
Reference Point Session Dosage Given: 1.8 Gy
Session Number: 28

## 2022-07-25 ENCOUNTER — Inpatient Hospital Stay: Payer: BC Managed Care – PPO

## 2022-07-25 VITALS — BP 106/80 | HR 68 | Temp 97.7°F | Resp 18

## 2022-07-25 DIAGNOSIS — G479 Sleep disorder, unspecified: Secondary | ICD-10-CM | POA: Diagnosis not present

## 2022-07-25 DIAGNOSIS — E860 Dehydration: Secondary | ICD-10-CM | POA: Diagnosis not present

## 2022-07-25 DIAGNOSIS — C20 Malignant neoplasm of rectum: Secondary | ICD-10-CM | POA: Insufficient documentation

## 2022-07-25 DIAGNOSIS — L409 Psoriasis, unspecified: Secondary | ICD-10-CM | POA: Insufficient documentation

## 2022-07-25 DIAGNOSIS — Z923 Personal history of irradiation: Secondary | ICD-10-CM | POA: Insufficient documentation

## 2022-07-25 DIAGNOSIS — E871 Hypo-osmolality and hyponatremia: Secondary | ICD-10-CM

## 2022-07-25 DIAGNOSIS — I10 Essential (primary) hypertension: Secondary | ICD-10-CM | POA: Insufficient documentation

## 2022-07-25 DIAGNOSIS — M109 Gout, unspecified: Secondary | ICD-10-CM | POA: Diagnosis not present

## 2022-07-25 DIAGNOSIS — Z95828 Presence of other vascular implants and grafts: Secondary | ICD-10-CM

## 2022-07-25 MED ORDER — SODIUM CHLORIDE 0.9 % IV SOLN: Freq: Once | INTRAVENOUS | Status: AC

## 2022-07-25 MED ORDER — HEPARIN SOD (PORK) LOCK FLUSH 100 UNIT/ML IV SOLN
500.0000 [IU] | Freq: Once | INTRAVENOUS | Status: AC
  Administered 2022-07-25: 500 [IU] via INTRAVENOUS

## 2022-07-25 MED ORDER — SODIUM CHLORIDE 0.9% FLUSH
10.0000 mL | Freq: Once | INTRAVENOUS | Status: AC
  Administered 2022-07-25: 10 mL via INTRAVENOUS

## 2022-07-25 NOTE — Patient Instructions (Signed)
Rehydration, Adult Rehydration is the replacement of body fluids, salts, and minerals (electrolytes) that are lost during dehydration. Dehydration is when there is not enough water or other fluids in the body. This happens when you lose more fluids than you take in. Common causes of dehydration include: Not drinking enough fluids. This can occur when you are ill or doing activities that require a lot of energy, especially in hot weather. Conditions that cause loss of water or other fluids, such as diarrhea, vomiting, sweating, or urinating a lot. Other illnesses, such as fever or infection. Certain medicines, such as those that remove excess fluid from the body (diuretics). Symptoms of mild or moderate dehydration may include thirst, dry lips and mouth, and dizziness. Symptoms of severe dehydration may include increased heart rate, confusion, fainting, and not urinating. For severe dehydration, you may need to get fluids through an IV at the hospital. For mild or moderate dehydration, you can usually rehydrate at home by drinking certain fluids as told by your health care provider. What are the risks? Generally, rehydration is safe. However, taking in too much fluid (overhydration) can be a problem. This is rare. Overhydration can cause an electrolyte imbalance, kidney failure, or a decrease in salt (sodium) levels in the body. Supplies needed You will need an oral rehydration solution (ORS) if your health care provider tells you to use one. This is a drink to treat dehydration. It can be found in pharmacies and retail stores. How to rehydrate Fluids Follow instructions from your health care provider for rehydration. The kind of fluid and the amount you should drink depend on your condition. In general, you should choose drinks that you prefer. If told by your health care provider, drink an ORS. Make an ORS by following instructions on the package. Start by drinking small amounts, about  cup (120  mL) every 5-10 minutes. Slowly increase how much you drink until you have taken the amount recommended by your health care provider. Drink enough clear fluids to keep your urine pale yellow. If you were told to drink an ORS, finish it first, then start slowly drinking other clear fluids. Drink fluids such as: Water. This includes sparkling water and flavored water. Drinking only water can lead to having too little sodium in your body (hyponatremia). Follow the advice of your health care provider. Water from ice chips you suck on. Fruit juice with water you add to it (diluted). Sports drinks. Hot or cold herbal teas. Broth-based soups. Milk or milk products. Food Follow instructions from your health care provider about what to eat while you rehydrate. Your health care provider may recommend that you slowly begin eating regular foods in small amounts. Eat foods that contain a healthy balance of electrolytes, such as bananas, oranges, potatoes, tomatoes, and spinach. Avoid foods that are greasy or contain a lot of sugar. In some cases, you may get nutrition through a feeding tube that is passed through your nose and into your stomach (nasogastric tube, or NG tube). This may be done if you have uncontrolled vomiting or diarrhea. Beverages to avoid  Certain beverages may make dehydration worse. While you rehydrate, avoid drinking alcohol. How to tell if you are recovering from dehydration You may be recovering from dehydration if: You are urinating more often than before you started rehydrating. Your urine is pale yellow. Your energy level improves. You vomit less frequently. You have diarrhea less frequently. Your appetite improves or returns to normal. You feel less dizzy or less light-headed.   Your skin tone and color start to look more normal. Follow these instructions at home: Take over-the-counter and prescription medicines only as told by your health care provider. Do not take sodium  tablets. Doing this can lead to having too much sodium in your body (hypernatremia). Contact a health care provider if: You continue to have symptoms of mild or moderate dehydration, such as: Thirst. Dry lips. Slightly dry mouth. Dizziness. Dark urine or less urine than normal. Muscle cramps. You continue to vomit or have diarrhea. Get help right away if you: Have symptoms of dehydration that get worse. Have a fever. Have a severe headache. Have been vomiting and the following happens: Your vomiting gets worse or does not go away. Your vomit includes blood or green matter (bile). You cannot eat or drink without vomiting. Have problems with urination or bowel movements, such as: Diarrhea that gets worse or does not go away. Blood in your stool (feces). This may cause stool to look black and tarry. Not urinating, or urinating only a small amount of very dark urine, within 6-8 hours. Have trouble breathing. Have symptoms that get worse with treatment. These symptoms may represent a serious problem that is an emergency. Do not wait to see if the symptoms will go away. Get medical help right away. Call your local emergency services (911 in the [place] Do not drive yourself to the hospital. Summary Rehydration is the replacement of body fluids and minerals (electrolytes) that are lost during dehydration. Follow instructions from your health care provider for rehydration. The kind of fluid and amount you should drink depend on your condition. Slowly increase how much you drink until you have taken the amount recommended by your health care provider. Contact your health care provider if you continue to show signs of mild or moderate dehydration. This information is not intended to replace advice given to you by your health care provider. Make sure you discuss any questions you have with your health care provider. Document Revised: 01/04/2020 Document Reviewed: 11/14/2019 Elsevier Patient  Education  2023 [employer]  

## 2022-07-28 ENCOUNTER — Other Ambulatory Visit: Payer: Self-pay | Admitting: *Deleted

## 2022-07-28 DIAGNOSIS — E871 Hypo-osmolality and hyponatremia: Secondary | ICD-10-CM

## 2022-07-29 ENCOUNTER — Inpatient Hospital Stay: Payer: BC Managed Care – PPO

## 2022-07-29 VITALS — BP 119/79 | HR 65

## 2022-07-29 DIAGNOSIS — G479 Sleep disorder, unspecified: Secondary | ICD-10-CM | POA: Diagnosis not present

## 2022-07-29 DIAGNOSIS — Z923 Personal history of irradiation: Secondary | ICD-10-CM | POA: Diagnosis not present

## 2022-07-29 DIAGNOSIS — E871 Hypo-osmolality and hyponatremia: Secondary | ICD-10-CM

## 2022-07-29 DIAGNOSIS — Z95828 Presence of other vascular implants and grafts: Secondary | ICD-10-CM

## 2022-07-29 DIAGNOSIS — L409 Psoriasis, unspecified: Secondary | ICD-10-CM | POA: Diagnosis not present

## 2022-07-29 DIAGNOSIS — C20 Malignant neoplasm of rectum: Secondary | ICD-10-CM | POA: Diagnosis not present

## 2022-07-29 DIAGNOSIS — E860 Dehydration: Secondary | ICD-10-CM | POA: Diagnosis not present

## 2022-07-29 DIAGNOSIS — M109 Gout, unspecified: Secondary | ICD-10-CM | POA: Diagnosis not present

## 2022-07-29 DIAGNOSIS — I10 Essential (primary) hypertension: Secondary | ICD-10-CM | POA: Diagnosis not present

## 2022-07-29 LAB — CMP (CANCER CENTER ONLY)
ALT: 23 U/L (ref 0–44)
AST: 30 U/L (ref 15–41)
Albumin: 4.2 g/dL (ref 3.5–5.0)
Alkaline Phosphatase: 169 U/L — ABNORMAL HIGH (ref 38–126)
Anion gap: 7 (ref 5–15)
BUN: 13 mg/dL (ref 8–23)
CO2: 27 mmol/L (ref 22–32)
Calcium: 9.8 mg/dL (ref 8.9–10.3)
Chloride: 104 mmol/L (ref 98–111)
Creatinine: 1.08 mg/dL (ref 0.61–1.24)
GFR, Estimated: >60 mL/min (ref >60)
Glucose, Bld: 103 mg/dL — ABNORMAL HIGH (ref 70–99)
Potassium: 4 mmol/L (ref 3.5–5.1)
Sodium: 138 mmol/L (ref 135–145)
Total Bilirubin: 0.7 mg/dL (ref 0.3–1.2)
Total Protein: 6.5 g/dL (ref 6.5–8.1)

## 2022-07-29 LAB — CBC WITH DIFFERENTIAL (CANCER CENTER ONLY)
Abs Immature Granulocytes: 0.01 10*3/uL (ref 0.00–0.07)
Basophils Absolute: 0 10*3/uL (ref 0.0–0.1)
Basophils Relative: 0 %
Eosinophils Absolute: 0.1 10*3/uL (ref 0.0–0.5)
Eosinophils Relative: 3 %
HCT: 29.1 % — ABNORMAL LOW (ref 39.0–52.0)
Hemoglobin: 9.9 g/dL — ABNORMAL LOW (ref 13.0–17.0)
Immature Granulocytes: 0 %
Lymphocytes Relative: 13 %
Lymphs Abs: 0.5 10*3/uL — ABNORMAL LOW (ref 0.7–4.0)
MCH: 35.4 pg — ABNORMAL HIGH (ref 26.0–34.0)
MCHC: 34 g/dL (ref 30.0–36.0)
MCV: 103.9 fL — ABNORMAL HIGH (ref 80.0–100.0)
Monocytes Absolute: 0.3 10*3/uL (ref 0.1–1.0)
Monocytes Relative: 9 %
Neutro Abs: 2.7 10*3/uL (ref 1.7–7.7)
Neutrophils Relative %: 75 %
Platelet Count: 105 10*3/uL — ABNORMAL LOW (ref 150–400)
RBC: 2.8 MIL/uL — ABNORMAL LOW (ref 4.22–5.81)
RDW: 17.2 % — ABNORMAL HIGH (ref 11.5–15.5)
WBC Count: 3.6 10*3/uL — ABNORMAL LOW (ref 4.0–10.5)
nRBC: 0 % (ref 0.0–0.2)

## 2022-07-29 MED ORDER — HEPARIN SOD (PORK) LOCK FLUSH 100 UNIT/ML IV SOLN
500.0000 [IU] | Freq: Once | INTRAVENOUS | Status: AC
  Administered 2022-07-29: 500 [IU] via INTRAVENOUS

## 2022-07-29 MED ORDER — SODIUM CHLORIDE 0.9% FLUSH
10.0000 mL | Freq: Once | INTRAVENOUS | Status: AC
  Administered 2022-07-29: 10 mL via INTRAVENOUS

## 2022-07-29 MED ORDER — SODIUM CHLORIDE 0.9 % IV SOLN: INTRAVENOUS | Status: AC

## 2022-07-29 NOTE — Patient Instructions (Signed)
Rehydration, Adult Rehydration is the replacement of body fluids, salts, and minerals (electrolytes) that are lost during dehydration. Dehydration is when there is not enough water or other fluids in the body. This happens when you lose more fluids than you take in. Common causes of dehydration include: Not drinking enough fluids. This can occur when you are ill or doing activities that require a lot of energy, especially in hot weather. Conditions that cause loss of water or other fluids, such as diarrhea, vomiting, sweating, or urinating a lot. Other illnesses, such as fever or infection. Certain medicines, such as those that remove excess fluid from the body (diuretics). Symptoms of mild or moderate dehydration may include thirst, dry lips and mouth, and dizziness. Symptoms of severe dehydration may include increased heart rate, confusion, fainting, and not urinating. For severe dehydration, you may need to get fluids through an IV at the hospital. For mild or moderate dehydration, you can usually rehydrate at home by drinking certain fluids as told by your health care provider. What are the risks? Generally, rehydration is safe. However, taking in too much fluid (overhydration) can be a problem. This is rare. Overhydration can cause an electrolyte imbalance, kidney failure, or a decrease in salt (sodium) levels in the body. Supplies needed You will need an oral rehydration solution (ORS) if your health care provider tells you to use one. This is a drink to treat dehydration. It can be found in pharmacies and retail stores. How to rehydrate Fluids Follow instructions from your health care provider for rehydration. The kind of fluid and the amount you should drink depend on your condition. In general, you should choose drinks that you prefer. If told by your health care provider, drink an ORS. Make an ORS by following instructions on the package. Start by drinking small amounts, about  cup (120  mL) every 5-10 minutes. Slowly increase how much you drink until you have taken the amount recommended by your health care provider. Drink enough clear fluids to keep your urine pale yellow. If you were told to drink an ORS, finish it first, then start slowly drinking other clear fluids. Drink fluids such as: Water. This includes sparkling water and flavored water. Drinking only water can lead to having too little sodium in your body (hyponatremia). Follow the advice of your health care provider. Water from ice chips you suck on. Fruit juice with water you add to it (diluted). Sports drinks. Hot or cold herbal teas. Broth-based soups. Milk or milk products. Food Follow instructions from your health care provider about what to eat while you rehydrate. Your health care provider may recommend that you slowly begin eating regular foods in small amounts. Eat foods that contain a healthy balance of electrolytes, such as bananas, oranges, potatoes, tomatoes, and spinach. Avoid foods that are greasy or contain a lot of sugar. In some cases, you may get nutrition through a feeding tube that is passed through your nose and into your stomach (nasogastric tube, or NG tube). This may be done if you have uncontrolled vomiting or diarrhea. Beverages to avoid  Certain beverages may make dehydration worse. While you rehydrate, avoid drinking alcohol. How to tell if you are recovering from dehydration You may be recovering from dehydration if: You are urinating more often than before you started rehydrating. Your urine is pale yellow. Your energy level improves. You vomit less frequently. You have diarrhea less frequently. Your appetite improves or returns to normal. You feel less dizzy or less light-headed.   Your skin tone and color start to look more normal. Follow these instructions at home: Take over-the-counter and prescription medicines only as told by your health care provider. Do not take sodium  tablets. Doing this can lead to having too much sodium in your body (hypernatremia). Contact a health care provider if: You continue to have symptoms of mild or moderate dehydration, such as: Thirst. Dry lips. Slightly dry mouth. Dizziness. Dark urine or less urine than normal. Muscle cramps. You continue to vomit or have diarrhea. Get help right away if you: Have symptoms of dehydration that get worse. Have a fever. Have a severe headache. Have been vomiting and the following happens: Your vomiting gets worse or does not go away. Your vomit includes blood or green matter (bile). You cannot eat or drink without vomiting. Have problems with urination or bowel movements, such as: Diarrhea that gets worse or does not go away. Blood in your stool (feces). This may cause stool to look black and tarry. Not urinating, or urinating only a small amount of very dark urine, within 6-8 hours. Have trouble breathing. Have symptoms that get worse with treatment. These symptoms may represent a serious problem that is an emergency. Do not wait to see if the symptoms will go away. Get medical help right away. Call your local emergency services (911 in the [place] Do not drive yourself to the hospital. Summary Rehydration is the replacement of body fluids and minerals (electrolytes) that are lost during dehydration. Follow instructions from your health care provider for rehydration. The kind of fluid and amount you should drink depend on your condition. Slowly increase how much you drink until you have taken the amount recommended by your health care provider. Contact your health care provider if you continue to show signs of mild or moderate dehydration. This information is not intended to replace advice given to you by your health care provider. Make sure you discuss any questions you have with your health care provider. Document Revised: 01/04/2020 Document Reviewed: 11/14/2019 Elsevier Patient  Education  2023 [employer]  

## 2022-07-29 NOTE — Patient Instructions (Signed)

## 2022-08-01 ENCOUNTER — Inpatient Hospital Stay: Payer: BC Managed Care – PPO

## 2022-08-01 ENCOUNTER — Encounter: Payer: Self-pay | Admitting: *Deleted

## 2022-08-01 ENCOUNTER — Other Ambulatory Visit: Payer: Self-pay | Admitting: *Deleted

## 2022-08-01 VITALS — BP 126/81 | HR 66 | Temp 97.7°F | Resp 18

## 2022-08-01 DIAGNOSIS — Z95828 Presence of other vascular implants and grafts: Secondary | ICD-10-CM

## 2022-08-01 DIAGNOSIS — Z923 Personal history of irradiation: Secondary | ICD-10-CM | POA: Diagnosis not present

## 2022-08-01 DIAGNOSIS — I10 Essential (primary) hypertension: Secondary | ICD-10-CM | POA: Diagnosis not present

## 2022-08-01 DIAGNOSIS — M109 Gout, unspecified: Secondary | ICD-10-CM | POA: Diagnosis not present

## 2022-08-01 DIAGNOSIS — C20 Malignant neoplasm of rectum: Secondary | ICD-10-CM | POA: Diagnosis not present

## 2022-08-01 DIAGNOSIS — E871 Hypo-osmolality and hyponatremia: Secondary | ICD-10-CM

## 2022-08-01 DIAGNOSIS — L409 Psoriasis, unspecified: Secondary | ICD-10-CM | POA: Diagnosis not present

## 2022-08-01 DIAGNOSIS — E860 Dehydration: Secondary | ICD-10-CM | POA: Diagnosis not present

## 2022-08-01 DIAGNOSIS — G479 Sleep disorder, unspecified: Secondary | ICD-10-CM | POA: Diagnosis not present

## 2022-08-01 MED ORDER — HEPARIN SOD (PORK) LOCK FLUSH 100 UNIT/ML IV SOLN
500.0000 [IU] | Freq: Once | INTRAVENOUS | Status: AC
  Administered 2022-08-01: 500 [IU] via INTRAVENOUS

## 2022-08-01 MED ORDER — SODIUM CHLORIDE 0.9% FLUSH
10.0000 mL | Freq: Once | INTRAVENOUS | Status: AC
  Administered 2022-08-01: 10 mL via INTRAVENOUS

## 2022-08-01 MED ORDER — SODIUM CHLORIDE 0.9 % IV SOLN: INTRAVENOUS | Status: AC

## 2022-08-01 NOTE — Patient Instructions (Signed)
Rehydration, Adult Rehydration is the replacement of body fluids, salts, and minerals (electrolytes) that are lost during dehydration. Dehydration is when there is not enough water or other fluids in the body. This happens when you lose more fluids than you take in. Common causes of dehydration include: Not drinking enough fluids. This can occur when you are ill or doing activities that require a lot of energy, especially in hot weather. Conditions that cause loss of water or other fluids, such as diarrhea, vomiting, sweating, or urinating a lot. Other illnesses, such as fever or infection. Certain medicines, such as those that remove excess fluid from the body (diuretics). Symptoms of mild or moderate dehydration may include thirst, dry lips and mouth, and dizziness. Symptoms of severe dehydration may include increased heart rate, confusion, fainting, and not urinating. For severe dehydration, you may need to get fluids through an IV at the hospital. For mild or moderate dehydration, you can usually rehydrate at home by drinking certain fluids as told by your health care provider. What are the risks? Generally, rehydration is safe. However, taking in too much fluid (overhydration) can be a problem. This is rare. Overhydration can cause an electrolyte imbalance, kidney failure, or a decrease in salt (sodium) levels in the body. Supplies needed You will need an oral rehydration solution (ORS) if your health care provider tells you to use one. This is a drink to treat dehydration. It can be found in pharmacies and retail stores. How to rehydrate Fluids Follow instructions from your health care provider for rehydration. The kind of fluid and the amount you should drink depend on your condition. In general, you should choose drinks that you prefer. If told by your health care provider, drink an ORS. Make an ORS by following instructions on the package. Start by drinking small amounts, about  cup (120  mL) every 5-10 minutes. Slowly increase how much you drink until you have taken the amount recommended by your health care provider. Drink enough clear fluids to keep your urine pale yellow. If you were told to drink an ORS, finish it first, then start slowly drinking other clear fluids. Drink fluids such as: Water. This includes sparkling water and flavored water. Drinking only water can lead to having too little sodium in your body (hyponatremia). Follow the advice of your health care provider. Water from ice chips you suck on. Fruit juice with water you add to it (diluted). Sports drinks. Hot or cold herbal teas. Broth-based soups. Milk or milk products. Food Follow instructions from your health care provider about what to eat while you rehydrate. Your health care provider may recommend that you slowly begin eating regular foods in small amounts. Eat foods that contain a healthy balance of electrolytes, such as bananas, oranges, potatoes, tomatoes, and spinach. Avoid foods that are greasy or contain a lot of sugar. In some cases, you may get nutrition through a feeding tube that is passed through your nose and into your stomach (nasogastric tube, or NG tube). This may be done if you have uncontrolled vomiting or diarrhea. Beverages to avoid  Certain beverages may make dehydration worse. While you rehydrate, avoid drinking alcohol. How to tell if you are recovering from dehydration You may be recovering from dehydration if: You are urinating more often than before you started rehydrating. Your urine is pale yellow. Your energy level improves. You vomit less frequently. You have diarrhea less frequently. Your appetite improves or returns to normal. You feel less dizzy or less light-headed.   Your skin tone and color start to look more normal. Follow these instructions at home: Take over-the-counter and prescription medicines only as told by your health care provider. Do not take sodium  tablets. Doing this can lead to having too much sodium in your body (hypernatremia). Contact a health care provider if: You continue to have symptoms of mild or moderate dehydration, such as: Thirst. Dry lips. Slightly dry mouth. Dizziness. Dark urine or less urine than normal. Muscle cramps. You continue to vomit or have diarrhea. Get help right away if you: Have symptoms of dehydration that get worse. Have a fever. Have a severe headache. Have been vomiting and the following happens: Your vomiting gets worse or does not go away. Your vomit includes blood or green matter (bile). You cannot eat or drink without vomiting. Have problems with urination or bowel movements, such as: Diarrhea that gets worse or does not go away. Blood in your stool (feces). This may cause stool to look black and tarry. Not urinating, or urinating only a small amount of very dark urine, within 6-8 hours. Have trouble breathing. Have symptoms that get worse with treatment. These symptoms may represent a serious problem that is an emergency. Do not wait to see if the symptoms will go away. Get medical help right away. Call your local emergency services (911 in the [place] Do not drive yourself to the hospital. Summary Rehydration is the replacement of body fluids and minerals (electrolytes) that are lost during dehydration. Follow instructions from your health care provider for rehydration. The kind of fluid and amount you should drink depend on your condition. Slowly increase how much you drink until you have taken the amount recommended by your health care provider. Contact your health care provider if you continue to show signs of mild or moderate dehydration. This information is not intended to replace advice given to you by your health care provider. Make sure you discuss any questions you have with your health care provider. Document Revised: 01/04/2020 Document Reviewed: 11/14/2019 Elsevier Patient  Education  2023 [employer]  

## 2022-08-04 ENCOUNTER — Inpatient Hospital Stay: Payer: BC Managed Care – PPO

## 2022-08-04 VITALS — BP 115/80 | HR 62 | Temp 97.5°F | Resp 18

## 2022-08-04 DIAGNOSIS — Z923 Personal history of irradiation: Secondary | ICD-10-CM | POA: Diagnosis not present

## 2022-08-04 DIAGNOSIS — M109 Gout, unspecified: Secondary | ICD-10-CM | POA: Diagnosis not present

## 2022-08-04 DIAGNOSIS — E871 Hypo-osmolality and hyponatremia: Secondary | ICD-10-CM

## 2022-08-04 DIAGNOSIS — Z95828 Presence of other vascular implants and grafts: Secondary | ICD-10-CM

## 2022-08-04 DIAGNOSIS — G479 Sleep disorder, unspecified: Secondary | ICD-10-CM | POA: Diagnosis not present

## 2022-08-04 DIAGNOSIS — E860 Dehydration: Secondary | ICD-10-CM | POA: Diagnosis not present

## 2022-08-04 DIAGNOSIS — L409 Psoriasis, unspecified: Secondary | ICD-10-CM | POA: Diagnosis not present

## 2022-08-04 DIAGNOSIS — I10 Essential (primary) hypertension: Secondary | ICD-10-CM | POA: Diagnosis not present

## 2022-08-04 DIAGNOSIS — C20 Malignant neoplasm of rectum: Secondary | ICD-10-CM | POA: Diagnosis not present

## 2022-08-04 MED ORDER — SODIUM CHLORIDE 0.9 % IV SOLN: INTRAVENOUS | Status: AC

## 2022-08-04 MED ORDER — HEPARIN SOD (PORK) LOCK FLUSH 100 UNIT/ML IV SOLN
500.0000 [IU] | Freq: Once | INTRAVENOUS | Status: AC
  Administered 2022-08-04: 500 [IU] via INTRAVENOUS

## 2022-08-04 MED ORDER — SODIUM CHLORIDE 0.9% FLUSH
10.0000 mL | Freq: Once | INTRAVENOUS | Status: AC
  Administered 2022-08-04: 10 mL via INTRAVENOUS

## 2022-08-04 NOTE — Patient Instructions (Signed)
Rehydration, Adult Rehydration is the replacement of body fluids, salts, and minerals (electrolytes) that are lost during dehydration. Dehydration is when there is not enough water or other fluids in the body. This happens when you lose more fluids than you take in. Common causes of dehydration include: Not drinking enough fluids. This can occur when you are ill or doing activities that require a lot of energy, especially in hot weather. Conditions that cause loss of water or other fluids, such as diarrhea, vomiting, sweating, or urinating a lot. Other illnesses, such as fever or infection. Certain medicines, such as those that remove excess fluid from the body (diuretics). Symptoms of mild or moderate dehydration may include thirst, dry lips and mouth, and dizziness. Symptoms of severe dehydration may include increased heart rate, confusion, fainting, and not urinating. For severe dehydration, you may need to get fluids through an IV at the hospital. For mild or moderate dehydration, you can usually rehydrate at home by drinking certain fluids as told by your health care provider. What are the risks? Generally, rehydration is safe. However, taking in too much fluid (overhydration) can be a problem. This is rare. Overhydration can cause an electrolyte imbalance, kidney failure, or a decrease in salt (sodium) levels in the body. Supplies needed You will need an oral rehydration solution (ORS) if your health care provider tells you to use one. This is a drink to treat dehydration. It can be found in pharmacies and retail stores. How to rehydrate Fluids Follow instructions from your health care provider for rehydration. The kind of fluid and the amount you should drink depend on your condition. In general, you should choose drinks that you prefer. If told by your health care provider, drink an ORS. Make an ORS by following instructions on the package. Start by drinking small amounts, about  cup (120  mL) every 5-10 minutes. Slowly increase how much you drink until you have taken the amount recommended by your health care provider. Drink enough clear fluids to keep your urine pale yellow. If you were told to drink an ORS, finish it first, then start slowly drinking other clear fluids. Drink fluids such as: Water. This includes sparkling water and flavored water. Drinking only water can lead to having too little sodium in your body (hyponatremia). Follow the advice of your health care provider. Water from ice chips you suck on. Fruit juice with water you add to it (diluted). Sports drinks. Hot or cold herbal teas. Broth-based soups. Milk or milk products. Food Follow instructions from your health care provider about what to eat while you rehydrate. Your health care provider may recommend that you slowly begin eating regular foods in small amounts. Eat foods that contain a healthy balance of electrolytes, such as bananas, oranges, potatoes, tomatoes, and spinach. Avoid foods that are greasy or contain a lot of sugar. In some cases, you may get nutrition through a feeding tube that is passed through your nose and into your stomach (nasogastric tube, or NG tube). This may be done if you have uncontrolled vomiting or diarrhea. Beverages to avoid  Certain beverages may make dehydration worse. While you rehydrate, avoid drinking alcohol. How to tell if you are recovering from dehydration You may be recovering from dehydration if: You are urinating more often than before you started rehydrating. Your urine is pale yellow. Your energy level improves. You vomit less frequently. You have diarrhea less frequently. Your appetite improves or returns to normal. You feel less dizzy or less light-headed.   Your skin tone and color start to look more normal. Follow these instructions at home: Take over-the-counter and prescription medicines only as told by your health care provider. Do not take sodium  tablets. Doing this can lead to having too much sodium in your body (hypernatremia). Contact a health care provider if: You continue to have symptoms of mild or moderate dehydration, such as: Thirst. Dry lips. Slightly dry mouth. Dizziness. Dark urine or less urine than normal. Muscle cramps. You continue to vomit or have diarrhea. Get help right away if you: Have symptoms of dehydration that get worse. Have a fever. Have a severe headache. Have been vomiting and the following happens: Your vomiting gets worse or does not go away. Your vomit includes blood or green matter (bile). You cannot eat or drink without vomiting. Have problems with urination or bowel movements, such as: Diarrhea that gets worse or does not go away. Blood in your stool (feces). This may cause stool to look black and tarry. Not urinating, or urinating only a small amount of very dark urine, within 6-8 hours. Have trouble breathing. Have symptoms that get worse with treatment. These symptoms may represent a serious problem that is an emergency. Do not wait to see if the symptoms will go away. Get medical help right away. Call your local emergency services (911 in the [place] Do not drive yourself to the hospital. Summary Rehydration is the replacement of body fluids and minerals (electrolytes) that are lost during dehydration. Follow instructions from your health care provider for rehydration. The kind of fluid and amount you should drink depend on your condition. Slowly increase how much you drink until you have taken the amount recommended by your health care provider. Contact your health care provider if you continue to show signs of mild or moderate dehydration. This information is not intended to replace advice given to you by your health care provider. Make sure you discuss any questions you have with your health care provider. Document Revised: 01/04/2020 Document Reviewed: 11/14/2019 Elsevier Patient  Education  2023 [employer]  

## 2022-08-07 ENCOUNTER — Other Ambulatory Visit (HOSPITAL_BASED_OUTPATIENT_CLINIC_OR_DEPARTMENT_OTHER): Payer: Self-pay

## 2022-08-08 ENCOUNTER — Inpatient Hospital Stay: Payer: BC Managed Care – PPO

## 2022-08-08 ENCOUNTER — Inpatient Hospital Stay: Payer: BC Managed Care – PPO | Admitting: Nurse Practitioner

## 2022-08-08 ENCOUNTER — Other Ambulatory Visit (HOSPITAL_BASED_OUTPATIENT_CLINIC_OR_DEPARTMENT_OTHER): Payer: Self-pay

## 2022-08-08 ENCOUNTER — Other Ambulatory Visit: Payer: Self-pay | Admitting: *Deleted

## 2022-08-08 ENCOUNTER — Encounter: Payer: Self-pay | Admitting: Nurse Practitioner

## 2022-08-08 VITALS — BP 124/74 | HR 63 | Resp 18

## 2022-08-08 VITALS — BP 110/80 | HR 79 | Temp 98.1°F | Resp 18 | Ht 71.0 in | Wt 200.8 lb

## 2022-08-08 DIAGNOSIS — C20 Malignant neoplasm of rectum: Secondary | ICD-10-CM | POA: Diagnosis not present

## 2022-08-08 DIAGNOSIS — I10 Essential (primary) hypertension: Secondary | ICD-10-CM | POA: Diagnosis not present

## 2022-08-08 DIAGNOSIS — E871 Hypo-osmolality and hyponatremia: Secondary | ICD-10-CM

## 2022-08-08 DIAGNOSIS — E860 Dehydration: Secondary | ICD-10-CM | POA: Diagnosis not present

## 2022-08-08 DIAGNOSIS — G479 Sleep disorder, unspecified: Secondary | ICD-10-CM | POA: Diagnosis not present

## 2022-08-08 DIAGNOSIS — Z95828 Presence of other vascular implants and grafts: Secondary | ICD-10-CM

## 2022-08-08 DIAGNOSIS — Z923 Personal history of irradiation: Secondary | ICD-10-CM | POA: Diagnosis not present

## 2022-08-08 DIAGNOSIS — L409 Psoriasis, unspecified: Secondary | ICD-10-CM | POA: Diagnosis not present

## 2022-08-08 DIAGNOSIS — M109 Gout, unspecified: Secondary | ICD-10-CM | POA: Diagnosis not present

## 2022-08-08 MED ORDER — HEPARIN SOD (PORK) LOCK FLUSH 100 UNIT/ML IV SOLN
500.0000 [IU] | Freq: Once | INTRAVENOUS | Status: AC
  Administered 2022-08-08: 500 [IU] via INTRAVENOUS

## 2022-08-08 MED ORDER — SODIUM CHLORIDE 0.9 % IV SOLN: INTRAVENOUS | Status: AC

## 2022-08-08 MED ORDER — SODIUM CHLORIDE 0.9% FLUSH
10.0000 mL | Freq: Once | INTRAVENOUS | Status: AC
  Administered 2022-08-08: 10 mL via INTRAVENOUS

## 2022-08-08 NOTE — Progress Notes (Signed)
  [place] OFFICE PROGRESS NOTE   Diagnosis: Rectal cancer  INTERVAL HISTORY:   Kenneth [name] returns as scheduled.  He completed the course of radiation/Xeloda 07/24/2022.  He receives IV fluids twice a week for a high output ileostomy.  Overall he is feeling well.  No nausea or vomiting.  No mouth sores.  He continues to have high output from the ileostomy.  He takes Lomotil on a scheduled basis.  Stable tingling in the hands and feet.  Main complaint is difficulty sleeping due to the ileostomy.  Objective:  Vital signs in last 24 hours:  Blood pressure 110/80, pulse 79, temperature 98.1 F (36.7 C), temperature source Oral, resp. rate 18, height $RemoveBe'5\' 11"'UvASJUewY$  (1.803 m), weight 200 lb 12.8 oz (91.1 kg), SpO2 100 %.    HEENT: No thrush or ulcers. Resp: Lungs clear bilaterally. Cardio: Regular rate and rhythm. GI: Abdomen soft and nontender.  No hepatomegaly.  Right abdomen ileostomy. Vascular: No leg edema. Neuro: Alert and oriented. Skin: Palms without erythema. Port-A-Cath without erythema.   Lab Results:  Lab Results  Component Value Date   WBC 3.6 (L) 07/29/2022   HGB 9.9 (L) 07/29/2022   HCT 29.1 (L) 07/29/2022   MCV 103.9 (H) 07/29/2022   PLT 105 (L) 07/29/2022   NEUTROABS 2.7 07/29/2022    Imaging:  No results found.  Medications: I have reviewed the patient's current medications.  Assessment/Plan: Rectal cancer-mass at 11 cm on proctoscopy by Kenneth [name] 02/04/2022 Colonoscopy 12/26/2021-obstructing mass at the rectosigmoid measured at 15 cm, biopsy invasive moderate to poorly differentiated adenocarcinoma, mismatch repair protein expression intact CTs 12/31/2021-masslike circumferential thickening of the distal sigmoid colon with mildly enlarged pericolonic lymph nodes, numerous subcentimeter hypodense liver lesions, splenomegaly MRI abdomen 01/07/2022-innumerable T2 hyperintense liver lesions consistent with cysts, spleen unremarkable, no ascites or  adenopathy, moderate colonic fecal retention, no bowel obstruction MRI pelvis 01/22/2022-T3cN2 tumor above and below the peritoneal reflection, tumor 11.3 cm from the anal verge and 6.1 cm from the sphincter complex, multiple enlarged perirectal nodes, 1.1 cm node versus tumor deposit at the right aspect of the tumor Diverting loop ileostomy 01/15/2022 Cycle 1 FOLFOX 02/17/2022 Cycle 2 FOLFOX 03/04/2022 Cycle 3 FOLFOX held on 03/17/2022 due to neutropenia Cycle 3 FOLFOX 03/17/2022, Udenyca Treatment held 03/31/2022 due to dehydration Cycle 4 FOLFOX 04/07/2022 Cycle 5 FOLFOX 04/22/2022, oxaliplatin dose reduced due to thrombocytopenia Cycle 6 FOLFOX 05/05/2022 Cycle 7 FOLFOX 05/19/2022 Cycle 8 FOLFOX held secondary to patient preference and toxicity (diarrhea/weight loss/neuropathy symptoms) 06/16/2022 radiation/Xeloda-07/24/2022 Gout Psoriasis Hypertension Left scalene node versus cyst/lipoma on exam 02/03/2022-CT neck 02/10/2022 with 9.5 mm lymph node in the left supraclavicular region posterior to the sternocleidomastoid muscle.  No other prominent lymph nodes in the neck.  Biopsy 02/12/2022 compatible with benign lymph node, negative for metastatic carcinoma.    Disposition: Kenneth [name] appears stable.  He has completed the course of radiation/Xeloda.  He is scheduled for a restaging MRI and follow-up visit with Kenneth [name] mid October.  He has a high output ileostomy.  He will continue IV fluids on a Tuesday Friday schedule.  He will return for a follow-up visit 09/02/2022.  We are available to see him sooner if needed.    [name] ANP/GNP-BC   08/08/2022  10:33 AM

## 2022-08-08 NOTE — Patient Instructions (Signed)
Rehydration, Adult Rehydration is the replacement of body fluids, salts, and minerals (electrolytes) that are lost during dehydration. Dehydration is when there is not enough water or other fluids in the body. This happens when you lose more fluids than you take in. Common causes of dehydration include: Not drinking enough fluids. This can occur when you are ill or doing activities that require a lot of energy, especially in hot weather. Conditions that cause loss of water or other fluids, such as diarrhea, vomiting, sweating, or urinating a lot. Other illnesses, such as fever or infection. Certain medicines, such as those that remove excess fluid from the body (diuretics). Symptoms of mild or moderate dehydration may include thirst, dry lips and mouth, and dizziness. Symptoms of severe dehydration may include increased heart rate, confusion, fainting, and not urinating. For severe dehydration, you may need to get fluids through an IV at the hospital. For mild or moderate dehydration, you can usually rehydrate at home by drinking certain fluids as told by your health care provider. What are the risks? Generally, rehydration is safe. However, taking in too much fluid (overhydration) can be a problem. This is rare. Overhydration can cause an electrolyte imbalance, kidney failure, or a decrease in salt (sodium) levels in the body. Supplies needed You will need an oral rehydration solution (ORS) if your health care provider tells you to use one. This is a drink to treat dehydration. It can be found in pharmacies and retail stores. How to rehydrate Fluids Follow instructions from your health care provider for rehydration. The kind of fluid and the amount you should drink depend on your condition. In general, you should choose drinks that you prefer. If told by your health care provider, drink an ORS. Make an ORS by following instructions on the package. Start by drinking small amounts, about  cup (120  mL) every 5-10 minutes. Slowly increase how much you drink until you have taken the amount recommended by your health care provider. Drink enough clear fluids to keep your urine pale yellow. If you were told to drink an ORS, finish it first, then start slowly drinking other clear fluids. Drink fluids such as: Water. This includes sparkling water and flavored water. Drinking only water can lead to having too little sodium in your body (hyponatremia). Follow the advice of your health care provider. Water from ice chips you suck on. Fruit juice with water you add to it (diluted). Sports drinks. Hot or cold herbal teas. Broth-based soups. Milk or milk products. Food Follow instructions from your health care provider about what to eat while you rehydrate. Your health care provider may recommend that you slowly begin eating regular foods in small amounts. Eat foods that contain a healthy balance of electrolytes, such as bananas, oranges, potatoes, tomatoes, and spinach. Avoid foods that are greasy or contain a lot of sugar. In some cases, you may get nutrition through a feeding tube that is passed through your nose and into your stomach (nasogastric tube, or NG tube). This may be done if you have uncontrolled vomiting or diarrhea. Beverages to avoid  Certain beverages may make dehydration worse. While you rehydrate, avoid drinking alcohol. How to tell if you are recovering from dehydration You may be recovering from dehydration if: You are urinating more often than before you started rehydrating. Your urine is pale yellow. Your energy level improves. You vomit less frequently. You have diarrhea less frequently. Your appetite improves or returns to normal. You feel less dizzy or less light-headed.   Your skin tone and color start to look more normal. Follow these instructions at home: Take over-the-counter and prescription medicines only as told by your health care provider. Do not take sodium  tablets. Doing this can lead to having too much sodium in your body (hypernatremia). Contact a health care provider if: You continue to have symptoms of mild or moderate dehydration, such as: Thirst. Dry lips. Slightly dry mouth. Dizziness. Dark urine or less urine than normal. Muscle cramps. You continue to vomit or have diarrhea. Get help right away if you: Have symptoms of dehydration that get worse. Have a fever. Have a severe headache. Have been vomiting and the following happens: Your vomiting gets worse or does not go away. Your vomit includes blood or green matter (bile). You cannot eat or drink without vomiting. Have problems with urination or bowel movements, such as: Diarrhea that gets worse or does not go away. Blood in your stool (feces). This may cause stool to look black and tarry. Not urinating, or urinating only a small amount of very dark urine, within 6-8 hours. Have trouble breathing. Have symptoms that get worse with treatment. These symptoms may represent a serious problem that is an emergency. Do not wait to see if the symptoms will go away. Get medical help right away. Call your local emergency services (911 in the [place] Do not drive yourself to the hospital. Summary Rehydration is the replacement of body fluids and minerals (electrolytes) that are lost during dehydration. Follow instructions from your health care provider for rehydration. The kind of fluid and amount you should drink depend on your condition. Slowly increase how much you drink until you have taken the amount recommended by your health care provider. Contact your health care provider if you continue to show signs of mild or moderate dehydration. This information is not intended to replace advice given to you by your health care provider. Make sure you discuss any questions you have with your health care provider. Document Revised: 01/04/2020 Document Reviewed: 11/14/2019 Elsevier Patient  Education  2023 [employer]  

## 2022-08-11 DIAGNOSIS — H5203 Hypermetropia, bilateral: Secondary | ICD-10-CM | POA: Diagnosis not present

## 2022-08-12 ENCOUNTER — Inpatient Hospital Stay: Payer: BC Managed Care – PPO

## 2022-08-12 ENCOUNTER — Ambulatory Visit: Payer: BC Managed Care – PPO | Admitting: Physical Therapy

## 2022-08-12 VITALS — BP 115/76 | HR 63 | Temp 98.2°F | Resp 18 | Ht 71.0 in | Wt 198.4 lb

## 2022-08-12 DIAGNOSIS — G479 Sleep disorder, unspecified: Secondary | ICD-10-CM | POA: Diagnosis not present

## 2022-08-12 DIAGNOSIS — E860 Dehydration: Secondary | ICD-10-CM | POA: Diagnosis not present

## 2022-08-12 DIAGNOSIS — Z95828 Presence of other vascular implants and grafts: Secondary | ICD-10-CM

## 2022-08-12 DIAGNOSIS — E871 Hypo-osmolality and hyponatremia: Secondary | ICD-10-CM

## 2022-08-12 DIAGNOSIS — M109 Gout, unspecified: Secondary | ICD-10-CM | POA: Diagnosis not present

## 2022-08-12 DIAGNOSIS — C20 Malignant neoplasm of rectum: Secondary | ICD-10-CM

## 2022-08-12 DIAGNOSIS — I10 Essential (primary) hypertension: Secondary | ICD-10-CM | POA: Diagnosis not present

## 2022-08-12 DIAGNOSIS — L409 Psoriasis, unspecified: Secondary | ICD-10-CM | POA: Diagnosis not present

## 2022-08-12 DIAGNOSIS — Z923 Personal history of irradiation: Secondary | ICD-10-CM | POA: Diagnosis not present

## 2022-08-12 LAB — CMP (CANCER CENTER ONLY)
ALT: 23 U/L (ref 0–44)
AST: 31 U/L (ref 15–41)
Albumin: 4.1 g/dL (ref 3.5–5.0)
Alkaline Phosphatase: 161 U/L — ABNORMAL HIGH (ref 38–126)
Anion gap: 6 (ref 5–15)
BUN: 14 mg/dL (ref 8–23)
CO2: 29 mmol/L (ref 22–32)
Calcium: 9.2 mg/dL (ref 8.9–10.3)
Chloride: 104 mmol/L (ref 98–111)
Creatinine: 1.1 mg/dL (ref 0.61–1.24)
GFR, Estimated: >60 mL/min (ref >60)
Glucose, Bld: 113 mg/dL — ABNORMAL HIGH (ref 70–99)
Potassium: 4 mmol/L (ref 3.5–5.1)
Sodium: 139 mmol/L (ref 135–145)
Total Bilirubin: 0.6 mg/dL (ref 0.3–1.2)
Total Protein: 6.4 g/dL — ABNORMAL LOW (ref 6.5–8.1)

## 2022-08-12 LAB — CBC WITH DIFFERENTIAL (CANCER CENTER ONLY)
Abs Immature Granulocytes: 0 10*3/uL (ref 0.00–0.07)
Basophils Absolute: 0 10*3/uL (ref 0.0–0.1)
Basophils Relative: 1 %
Eosinophils Absolute: 0.1 10*3/uL (ref 0.0–0.5)
Eosinophils Relative: 4 %
HCT: 32.9 % — ABNORMAL LOW (ref 39.0–52.0)
Hemoglobin: 10.8 g/dL — ABNORMAL LOW (ref 13.0–17.0)
Immature Granulocytes: 0 %
Lymphocytes Relative: 20 %
Lymphs Abs: 0.5 10*3/uL — ABNORMAL LOW (ref 0.7–4.0)
MCH: 34.5 pg — ABNORMAL HIGH (ref 26.0–34.0)
MCHC: 32.8 g/dL (ref 30.0–36.0)
MCV: 105.1 fL — ABNORMAL HIGH (ref 80.0–100.0)
Monocytes Absolute: 0.2 10*3/uL (ref 0.1–1.0)
Monocytes Relative: 7 %
Neutro Abs: 1.8 10*3/uL (ref 1.7–7.7)
Neutrophils Relative %: 68 %
Platelet Count: 114 10*3/uL — ABNORMAL LOW (ref 150–400)
RBC: 3.13 MIL/uL — ABNORMAL LOW (ref 4.22–5.81)
RDW: 14.6 % (ref 11.5–15.5)
WBC Count: 2.6 10*3/uL — ABNORMAL LOW (ref 4.0–10.5)
nRBC: 0 % (ref 0.0–0.2)

## 2022-08-12 MED ORDER — SODIUM CHLORIDE 0.9 % IV SOLN: INTRAVENOUS | Status: AC

## 2022-08-12 MED ORDER — HEPARIN SOD (PORK) LOCK FLUSH 100 UNIT/ML IV SOLN
500.0000 [IU] | Freq: Once | INTRAVENOUS | Status: AC
  Administered 2022-08-12: 500 [IU] via INTRAVENOUS

## 2022-08-12 MED ORDER — SODIUM CHLORIDE 0.9% FLUSH
10.0000 mL | Freq: Once | INTRAVENOUS | Status: AC
  Administered 2022-08-12: 10 mL via INTRAVENOUS

## 2022-08-12 NOTE — Patient Instructions (Signed)
Rehydration, Adult Rehydration is the replacement of body fluids, salts, and minerals (electrolytes) that are lost during dehydration. Dehydration is when there is not enough water or other fluids in the body. This happens when you lose more fluids than you take in. Common causes of dehydration include: Not drinking enough fluids. This can occur when you are ill or doing activities that require a lot of energy, especially in hot weather. Conditions that cause loss of water or other fluids, such as diarrhea, vomiting, sweating, or urinating a lot. Other illnesses, such as fever or infection. Certain medicines, such as those that remove excess fluid from the body (diuretics). Symptoms of mild or moderate dehydration may include thirst, dry lips and mouth, and dizziness. Symptoms of severe dehydration may include increased heart rate, confusion, fainting, and not urinating. For severe dehydration, you may need to get fluids through an IV at the hospital. For mild or moderate dehydration, you can usually rehydrate at home by drinking certain fluids as told by your health care provider. What are the risks? Generally, rehydration is safe. However, taking in too much fluid (overhydration) can be a problem. This is rare. Overhydration can cause an electrolyte imbalance, kidney failure, or a decrease in salt (sodium) levels in the body. Supplies needed You will need an oral rehydration solution (ORS) if your health care provider tells you to use one. This is a drink to treat dehydration. It can be found in pharmacies and retail stores. How to rehydrate Fluids Follow instructions from your health care provider for rehydration. The kind of fluid and the amount you should drink depend on your condition. In general, you should choose drinks that you prefer. If told by your health care provider, drink an ORS. Make an ORS by following instructions on the package. Start by drinking small amounts, about  cup (120  mL) every 5-10 minutes. Slowly increase how much you drink until you have taken the amount recommended by your health care provider. Drink enough clear fluids to keep your urine pale yellow. If you were told to drink an ORS, finish it first, then start slowly drinking other clear fluids. Drink fluids such as: Water. This includes sparkling water and flavored water. Drinking only water can lead to having too little sodium in your body (hyponatremia). Follow the advice of your health care provider. Water from ice chips you suck on. Fruit juice with water you add to it (diluted). Sports drinks. Hot or cold herbal teas. Broth-based soups. Milk or milk products. Food Follow instructions from your health care provider about what to eat while you rehydrate. Your health care provider may recommend that you slowly begin eating regular foods in small amounts. Eat foods that contain a healthy balance of electrolytes, such as bananas, oranges, potatoes, tomatoes, and spinach. Avoid foods that are greasy or contain a lot of sugar. In some cases, you may get nutrition through a feeding tube that is passed through your nose and into your stomach (nasogastric tube, or NG tube). This may be done if you have uncontrolled vomiting or diarrhea. Beverages to avoid  Certain beverages may make dehydration worse. While you rehydrate, avoid drinking alcohol. How to tell if you are recovering from dehydration You may be recovering from dehydration if: You are urinating more often than before you started rehydrating. Your urine is pale yellow. Your energy level improves. You vomit less frequently. You have diarrhea less frequently. Your appetite improves or returns to normal. You feel less dizzy or less light-headed.   Your skin tone and color start to look more normal. Follow these instructions at home: Take over-the-counter and prescription medicines only as told by your health care provider. Do not take sodium  tablets. Doing this can lead to having too much sodium in your body (hypernatremia). Contact a health care provider if: You continue to have symptoms of mild or moderate dehydration, such as: Thirst. Dry lips. Slightly dry mouth. Dizziness. Dark urine or less urine than normal. Muscle cramps. You continue to vomit or have diarrhea. Get help right away if you: Have symptoms of dehydration that get worse. Have a fever. Have a severe headache. Have been vomiting and the following happens: Your vomiting gets worse or does not go away. Your vomit includes blood or green matter (bile). You cannot eat or drink without vomiting. Have problems with urination or bowel movements, such as: Diarrhea that gets worse or does not go away. Blood in your stool (feces). This may cause stool to look black and tarry. Not urinating, or urinating only a small amount of very dark urine, within 6-8 hours. Have trouble breathing. Have symptoms that get worse with treatment. These symptoms may represent a serious problem that is an emergency. Do not wait to see if the symptoms will go away. Get medical help right away. Call your local emergency services (911 in the [place] Do not drive yourself to the hospital. Summary Rehydration is the replacement of body fluids and minerals (electrolytes) that are lost during dehydration. Follow instructions from your health care provider for rehydration. The kind of fluid and amount you should drink depend on your condition. Slowly increase how much you drink until you have taken the amount recommended by your health care provider. Contact your health care provider if you continue to show signs of mild or moderate dehydration. This information is not intended to replace advice given to you by your health care provider. Make sure you discuss any questions you have with your health care provider. Document Revised: 01/04/2020 Document Reviewed: 11/14/2019 Elsevier Patient  Education  2023 [employer]  

## 2022-08-15 ENCOUNTER — Inpatient Hospital Stay: Payer: BC Managed Care – PPO

## 2022-08-15 ENCOUNTER — Other Ambulatory Visit: Payer: Self-pay | Admitting: *Deleted

## 2022-08-15 VITALS — BP 114/84 | HR 61 | Temp 98.3°F | Resp 18 | Ht 71.0 in | Wt 204.1 lb

## 2022-08-15 DIAGNOSIS — L409 Psoriasis, unspecified: Secondary | ICD-10-CM | POA: Diagnosis not present

## 2022-08-15 DIAGNOSIS — M109 Gout, unspecified: Secondary | ICD-10-CM | POA: Diagnosis not present

## 2022-08-15 DIAGNOSIS — G479 Sleep disorder, unspecified: Secondary | ICD-10-CM | POA: Diagnosis not present

## 2022-08-15 DIAGNOSIS — C20 Malignant neoplasm of rectum: Secondary | ICD-10-CM | POA: Diagnosis not present

## 2022-08-15 DIAGNOSIS — Z923 Personal history of irradiation: Secondary | ICD-10-CM | POA: Diagnosis not present

## 2022-08-15 DIAGNOSIS — I10 Essential (primary) hypertension: Secondary | ICD-10-CM | POA: Diagnosis not present

## 2022-08-15 DIAGNOSIS — E871 Hypo-osmolality and hyponatremia: Secondary | ICD-10-CM

## 2022-08-15 DIAGNOSIS — Z95828 Presence of other vascular implants and grafts: Secondary | ICD-10-CM

## 2022-08-15 DIAGNOSIS — E860 Dehydration: Secondary | ICD-10-CM | POA: Diagnosis not present

## 2022-08-15 DIAGNOSIS — Z932 Ileostomy status: Secondary | ICD-10-CM | POA: Diagnosis not present

## 2022-08-15 MED ORDER — SODIUM CHLORIDE 0.9 % IV SOLN: INTRAVENOUS | Status: AC

## 2022-08-15 MED ORDER — HEPARIN SOD (PORK) LOCK FLUSH 100 UNIT/ML IV SOLN
500.0000 [IU] | Freq: Once | INTRAVENOUS | Status: AC
  Administered 2022-08-15: 500 [IU] via INTRAVENOUS

## 2022-08-15 MED ORDER — SODIUM CHLORIDE 0.9% FLUSH
10.0000 mL | Freq: Once | INTRAVENOUS | Status: AC
  Administered 2022-08-15: 10 mL via INTRAVENOUS

## 2022-08-15 NOTE — Patient Instructions (Signed)
Rehydration, Adult Rehydration is the replacement of body fluids, salts, and minerals (electrolytes) that are lost during dehydration. Dehydration is when there is not enough water or other fluids in the body. This happens when you lose more fluids than you take in. Common causes of dehydration include: Not drinking enough fluids. This can occur when you are ill or doing activities that require a lot of energy, especially in hot weather. Conditions that cause loss of water or other fluids, such as diarrhea, vomiting, sweating, or urinating a lot. Other illnesses, such as fever or infection. Certain medicines, such as those that remove excess fluid from the body (diuretics). Symptoms of mild or moderate dehydration may include thirst, dry lips and mouth, and dizziness. Symptoms of severe dehydration may include increased heart rate, confusion, fainting, and not urinating. For severe dehydration, you may need to get fluids through an IV at the hospital. For mild or moderate dehydration, you can usually rehydrate at home by drinking certain fluids as told by your health care provider. What are the risks? Generally, rehydration is safe. However, taking in too much fluid (overhydration) can be a problem. This is rare. Overhydration can cause an electrolyte imbalance, kidney failure, or a decrease in salt (sodium) levels in the body. Supplies needed You will need an oral rehydration solution (ORS) if your health care provider tells you to use one. This is a drink to treat dehydration. It can be found in pharmacies and retail stores. How to rehydrate Fluids Follow instructions from your health care provider for rehydration. The kind of fluid and the amount you should drink depend on your condition. In general, you should choose drinks that you prefer. If told by your health care provider, drink an ORS. Make an ORS by following instructions on the package. Start by drinking small amounts, about  cup (120  mL) every 5-10 minutes. Slowly increase how much you drink until you have taken the amount recommended by your health care provider. Drink enough clear fluids to keep your urine pale yellow. If you were told to drink an ORS, finish it first, then start slowly drinking other clear fluids. Drink fluids such as: Water. This includes sparkling water and flavored water. Drinking only water can lead to having too little sodium in your body (hyponatremia). Follow the advice of your health care provider. Water from ice chips you suck on. Fruit juice with water you add to it (diluted). Sports drinks. Hot or cold herbal teas. Broth-based soups. Milk or milk products. Food Follow instructions from your health care provider about what to eat while you rehydrate. Your health care provider may recommend that you slowly begin eating regular foods in small amounts. Eat foods that contain a healthy balance of electrolytes, such as bananas, oranges, potatoes, tomatoes, and spinach. Avoid foods that are greasy or contain a lot of sugar. In some cases, you may get nutrition through a feeding tube that is passed through your nose and into your stomach (nasogastric tube, or NG tube). This may be done if you have uncontrolled vomiting or diarrhea. Beverages to avoid  Certain beverages may make dehydration worse. While you rehydrate, avoid drinking alcohol. How to tell if you are recovering from dehydration You may be recovering from dehydration if: You are urinating more often than before you started rehydrating. Your urine is pale yellow. Your energy level improves. You vomit less frequently. You have diarrhea less frequently. Your appetite improves or returns to normal. You feel less dizzy or less light-headed.   Your skin tone and color start to look more normal. Follow these instructions at home: Take over-the-counter and prescription medicines only as told by your health care provider. Do not take sodium  tablets. Doing this can lead to having too much sodium in your body (hypernatremia). Contact a health care provider if: You continue to have symptoms of mild or moderate dehydration, such as: Thirst. Dry lips. Slightly dry mouth. Dizziness. Dark urine or less urine than normal. Muscle cramps. You continue to vomit or have diarrhea. Get help right away if you: Have symptoms of dehydration that get worse. Have a fever. Have a severe headache. Have been vomiting and the following happens: Your vomiting gets worse or does not go away. Your vomit includes blood or green matter (bile). You cannot eat or drink without vomiting. Have problems with urination or bowel movements, such as: Diarrhea that gets worse or does not go away. Blood in your stool (feces). This may cause stool to look black and tarry. Not urinating, or urinating only a small amount of very dark urine, within 6-8 hours. Have trouble breathing. Have symptoms that get worse with treatment. These symptoms may represent a serious problem that is an emergency. Do not wait to see if the symptoms will go away. Get medical help right away. Call your local emergency services (911 in the [place] Do not drive yourself to the hospital. Summary Rehydration is the replacement of body fluids and minerals (electrolytes) that are lost during dehydration. Follow instructions from your health care provider for rehydration. The kind of fluid and amount you should drink depend on your condition. Slowly increase how much you drink until you have taken the amount recommended by your health care provider. Contact your health care provider if you continue to show signs of mild or moderate dehydration. This information is not intended to replace advice given to you by your health care provider. Make sure you discuss any questions you have with your health care provider. Document Revised: 01/04/2020 Document Reviewed: 11/14/2019 Elsevier Patient  Education  2023 [employer]  

## 2022-08-19 ENCOUNTER — Inpatient Hospital Stay: Payer: BC Managed Care – PPO | Attending: Oncology

## 2022-08-19 VITALS — BP 115/79 | HR 62 | Temp 98.4°F | Resp 20 | Ht 71.0 in | Wt 203.1 lb

## 2022-08-19 DIAGNOSIS — Z923 Personal history of irradiation: Secondary | ICD-10-CM | POA: Insufficient documentation

## 2022-08-19 DIAGNOSIS — M109 Gout, unspecified: Secondary | ICD-10-CM | POA: Insufficient documentation

## 2022-08-19 DIAGNOSIS — I10 Essential (primary) hypertension: Secondary | ICD-10-CM | POA: Diagnosis not present

## 2022-08-19 DIAGNOSIS — L409 Psoriasis, unspecified: Secondary | ICD-10-CM | POA: Diagnosis not present

## 2022-08-19 DIAGNOSIS — Z95828 Presence of other vascular implants and grafts: Secondary | ICD-10-CM

## 2022-08-19 DIAGNOSIS — C20 Malignant neoplasm of rectum: Secondary | ICD-10-CM | POA: Diagnosis not present

## 2022-08-19 DIAGNOSIS — E871 Hypo-osmolality and hyponatremia: Secondary | ICD-10-CM

## 2022-08-19 DIAGNOSIS — E860 Dehydration: Secondary | ICD-10-CM | POA: Diagnosis not present

## 2022-08-19 MED ORDER — HEPARIN SOD (PORK) LOCK FLUSH 100 UNIT/ML IV SOLN
500.0000 [IU] | Freq: Once | INTRAVENOUS | Status: AC
  Administered 2022-08-19: 500 [IU] via INTRAVENOUS

## 2022-08-19 MED ORDER — SODIUM CHLORIDE 0.9 % IV SOLN: INTRAVENOUS | Status: AC

## 2022-08-19 MED ORDER — SODIUM CHLORIDE 0.9% FLUSH
10.0000 mL | Freq: Once | INTRAVENOUS | Status: AC
  Administered 2022-08-19: 10 mL via INTRAVENOUS

## 2022-08-19 NOTE — Patient Instructions (Signed)
Rehydration, Adult Rehydration is the replacement of body fluids, salts, and minerals (electrolytes) that are lost during dehydration. Dehydration is when there is not enough water or other fluids in the body. This happens when you lose more fluids than you take in. Common causes of dehydration include: Not drinking enough fluids. This can occur when you are ill or doing activities that require a lot of energy, especially in hot weather. Conditions that cause loss of water or other fluids, such as diarrhea, vomiting, sweating, or urinating a lot. Other illnesses, such as fever or infection. Certain medicines, such as those that remove excess fluid from the body (diuretics). Symptoms of mild or moderate dehydration may include thirst, dry lips and mouth, and dizziness. Symptoms of severe dehydration may include increased heart rate, confusion, fainting, and not urinating. For severe dehydration, you may need to get fluids through an IV at the hospital. For mild or moderate dehydration, you can usually rehydrate at home by drinking certain fluids as told by your health care provider. What are the risks? Generally, rehydration is safe. However, taking in too much fluid (overhydration) can be a problem. This is rare. Overhydration can cause an electrolyte imbalance, kidney failure, or a decrease in salt (sodium) levels in the body. Supplies needed You will need an oral rehydration solution (ORS) if your health care provider tells you to use one. This is a drink to treat dehydration. It can be found in pharmacies and retail stores. How to rehydrate Fluids Follow instructions from your health care provider for rehydration. The kind of fluid and the amount you should drink depend on your condition. In general, you should choose drinks that you prefer. If told by your health care provider, drink an ORS. Make an ORS by following instructions on the package. Start by drinking small amounts, about  cup (120  mL) every 5-10 minutes. Slowly increase how much you drink until you have taken the amount recommended by your health care provider. Drink enough clear fluids to keep your urine pale yellow. If you were told to drink an ORS, finish it first, then start slowly drinking other clear fluids. Drink fluids such as: Water. This includes sparkling water and flavored water. Drinking only water can lead to having too little sodium in your body (hyponatremia). Follow the advice of your health care provider. Water from ice chips you suck on. Fruit juice with water you add to it (diluted). Sports drinks. Hot or cold herbal teas. Broth-based soups. Milk or milk products. Food Follow instructions from your health care provider about what to eat while you rehydrate. Your health care provider may recommend that you slowly begin eating regular foods in small amounts. Eat foods that contain a healthy balance of electrolytes, such as bananas, oranges, potatoes, tomatoes, and spinach. Avoid foods that are greasy or contain a lot of sugar. In some cases, you may get nutrition through a feeding tube that is passed through your nose and into your stomach (nasogastric tube, or NG tube). This may be done if you have uncontrolled vomiting or diarrhea. Beverages to avoid  Certain beverages may make dehydration worse. While you rehydrate, avoid drinking alcohol. How to tell if you are recovering from dehydration You may be recovering from dehydration if: You are urinating more often than before you started rehydrating. Your urine is pale yellow. Your energy level improves. You vomit less frequently. You have diarrhea less frequently. Your appetite improves or returns to normal. You feel less dizzy or less light-headed.   Your skin tone and color start to look more normal. Follow these instructions at home: Take over-the-counter and prescription medicines only as told by your health care provider. Do not take sodium  tablets. Doing this can lead to having too much sodium in your body (hypernatremia). Contact a health care provider if: You continue to have symptoms of mild or moderate dehydration, such as: Thirst. Dry lips. Slightly dry mouth. Dizziness. Dark urine or less urine than normal. Muscle cramps. You continue to vomit or have diarrhea. Get help right away if you: Have symptoms of dehydration that get worse. Have a fever. Have a severe headache. Have been vomiting and the following happens: Your vomiting gets worse or does not go away. Your vomit includes blood or green matter (bile). You cannot eat or drink without vomiting. Have problems with urination or bowel movements, such as: Diarrhea that gets worse or does not go away. Blood in your stool (feces). This may cause stool to look black and tarry. Not urinating, or urinating only a small amount of very dark urine, within 6-8 hours. Have trouble breathing. Have symptoms that get worse with treatment. These symptoms may represent a serious problem that is an emergency. Do not wait to see if the symptoms will go away. Get medical help right away. Call your local emergency services (911 in the [place] Do not drive yourself to the hospital. Summary Rehydration is the replacement of body fluids and minerals (electrolytes) that are lost during dehydration. Follow instructions from your health care provider for rehydration. The kind of fluid and amount you should drink depend on your condition. Slowly increase how much you drink until you have taken the amount recommended by your health care provider. Contact your health care provider if you continue to show signs of mild or moderate dehydration. This information is not intended to replace advice given to you by your health care provider. Make sure you discuss any questions you have with your health care provider. Document Revised: 01/04/2020 Document Reviewed: 11/14/2019 Elsevier Patient  Education  2023 [employer]  

## 2022-08-22 ENCOUNTER — Other Ambulatory Visit: Payer: Self-pay | Admitting: Nurse Practitioner

## 2022-08-22 ENCOUNTER — Inpatient Hospital Stay: Payer: BC Managed Care – PPO

## 2022-08-22 ENCOUNTER — Other Ambulatory Visit: Payer: Self-pay

## 2022-08-22 ENCOUNTER — Other Ambulatory Visit (HOSPITAL_BASED_OUTPATIENT_CLINIC_OR_DEPARTMENT_OTHER): Payer: Self-pay

## 2022-08-22 ENCOUNTER — Other Ambulatory Visit: Payer: Self-pay | Admitting: *Deleted

## 2022-08-22 DIAGNOSIS — Z923 Personal history of irradiation: Secondary | ICD-10-CM | POA: Diagnosis not present

## 2022-08-22 DIAGNOSIS — E860 Dehydration: Secondary | ICD-10-CM | POA: Diagnosis not present

## 2022-08-22 DIAGNOSIS — I10 Essential (primary) hypertension: Secondary | ICD-10-CM | POA: Diagnosis not present

## 2022-08-22 DIAGNOSIS — C20 Malignant neoplasm of rectum: Secondary | ICD-10-CM | POA: Diagnosis not present

## 2022-08-22 DIAGNOSIS — M109 Gout, unspecified: Secondary | ICD-10-CM | POA: Diagnosis not present

## 2022-08-22 DIAGNOSIS — L409 Psoriasis, unspecified: Secondary | ICD-10-CM | POA: Diagnosis not present

## 2022-08-22 DIAGNOSIS — E871 Hypo-osmolality and hyponatremia: Secondary | ICD-10-CM

## 2022-08-22 MED ORDER — HEPARIN SOD (PORK) LOCK FLUSH 100 UNIT/ML IV SOLN
500.0000 [IU] | Freq: Once | INTRAVENOUS | Status: AC
  Administered 2022-08-22: 500 [IU] via INTRAVENOUS

## 2022-08-22 MED ORDER — SODIUM CHLORIDE 0.9% FLUSH
10.0000 mL | Freq: Once | INTRAVENOUS | Status: AC
  Administered 2022-08-22: 10 mL via INTRAVENOUS

## 2022-08-22 MED ORDER — SODIUM CHLORIDE 0.9 % IV SOLN: INTRAVENOUS | Status: AC

## 2022-08-22 MED ORDER — LIDOCAINE-PRILOCAINE 2.5-2.5 % EX CREA: 1.0000 | TOPICAL_CREAM | CUTANEOUS | 5 refills | Status: DC | PRN

## 2022-08-22 MED ORDER — LIDOCAINE-PRILOCAINE 2.5-2.5 % EX CREA
1.0000 | TOPICAL_CREAM | CUTANEOUS | 5 refills | Status: DC | PRN
  Filled 2022-08-22: qty 30, 30d supply, fill #0

## 2022-08-22 NOTE — Progress Notes (Signed)
IVF orders placed

## 2022-08-25 ENCOUNTER — Other Ambulatory Visit (HOSPITAL_BASED_OUTPATIENT_CLINIC_OR_DEPARTMENT_OTHER): Payer: Self-pay

## 2022-08-26 ENCOUNTER — Inpatient Hospital Stay: Payer: BC Managed Care – PPO

## 2022-08-26 ENCOUNTER — Other Ambulatory Visit: Payer: Self-pay | Admitting: *Deleted

## 2022-08-26 DIAGNOSIS — C20 Malignant neoplasm of rectum: Secondary | ICD-10-CM

## 2022-08-26 DIAGNOSIS — E860 Dehydration: Secondary | ICD-10-CM | POA: Diagnosis not present

## 2022-08-26 DIAGNOSIS — L409 Psoriasis, unspecified: Secondary | ICD-10-CM | POA: Diagnosis not present

## 2022-08-26 DIAGNOSIS — Z923 Personal history of irradiation: Secondary | ICD-10-CM | POA: Diagnosis not present

## 2022-08-26 DIAGNOSIS — I10 Essential (primary) hypertension: Secondary | ICD-10-CM | POA: Diagnosis not present

## 2022-08-26 DIAGNOSIS — M109 Gout, unspecified: Secondary | ICD-10-CM | POA: Diagnosis not present

## 2022-08-26 MED ORDER — HEPARIN SOD (PORK) LOCK FLUSH 100 UNIT/ML IV SOLN
500.0000 [IU] | Freq: Once | INTRAVENOUS | Status: AC
  Administered 2022-08-26: 500 [IU] via INTRAVENOUS

## 2022-08-26 MED ORDER — SODIUM CHLORIDE 0.9% FLUSH
10.0000 mL | Freq: Once | INTRAVENOUS | Status: AC
  Administered 2022-08-26: 10 mL via INTRAVENOUS

## 2022-08-26 MED ORDER — SODIUM CHLORIDE 0.9 % IV SOLN: INTRAVENOUS | Status: AC

## 2022-08-26 NOTE — Patient Instructions (Signed)
Rehydration, Adult Rehydration is the replacement of body fluids, salts, and minerals (electrolytes) that are lost during dehydration. Dehydration is when there is not enough water or other fluids in the body. This happens when you lose more fluids than you take in. Common causes of dehydration include: Not drinking enough fluids. This can occur when you are ill or doing activities that require a lot of energy, especially in hot weather. Conditions that cause loss of water or other fluids, such as diarrhea, vomiting, sweating, or urinating a lot. Other illnesses, such as fever or infection. Certain medicines, such as those that remove excess fluid from the body (diuretics). Symptoms of mild or moderate dehydration may include thirst, dry lips and mouth, and dizziness. Symptoms of severe dehydration may include increased heart rate, confusion, fainting, and not urinating. For severe dehydration, you may need to get fluids through an IV at the hospital. For mild or moderate dehydration, you can usually rehydrate at home by drinking certain fluids as told by your health care provider. What are the risks? Generally, rehydration is safe. However, taking in too much fluid (overhydration) can be a problem. This is rare. Overhydration can cause an electrolyte imbalance, kidney failure, or a decrease in salt (sodium) levels in the body. Supplies needed You will need an oral rehydration solution (ORS) if your health care provider tells you to use one. This is a drink to treat dehydration. It can be found in pharmacies and retail stores. How to rehydrate Fluids Follow instructions from your health care provider for rehydration. The kind of fluid and the amount you should drink depend on your condition. In general, you should choose drinks that you prefer. If told by your health care provider, drink an ORS. Make an ORS by following instructions on the package. Start by drinking small amounts, about  cup (120  mL) every 5-10 minutes. Slowly increase how much you drink until you have taken the amount recommended by your health care provider. Drink enough clear fluids to keep your urine pale yellow. If you were told to drink an ORS, finish it first, then start slowly drinking other clear fluids. Drink fluids such as: Water. This includes sparkling water and flavored water. Drinking only water can lead to having too little sodium in your body (hyponatremia). Follow the advice of your health care provider. Water from ice chips you suck on. Fruit juice with water you add to it (diluted). Sports drinks. Hot or cold herbal teas. Broth-based soups. Milk or milk products. Food Follow instructions from your health care provider about what to eat while you rehydrate. Your health care provider may recommend that you slowly begin eating regular foods in small amounts. Eat foods that contain a healthy balance of electrolytes, such as bananas, oranges, potatoes, tomatoes, and spinach. Avoid foods that are greasy or contain a lot of sugar. In some cases, you may get nutrition through a feeding tube that is passed through your nose and into your stomach (nasogastric tube, or NG tube). This may be done if you have uncontrolled vomiting or diarrhea. Beverages to avoid  Certain beverages may make dehydration worse. While you rehydrate, avoid drinking alcohol. How to tell if you are recovering from dehydration You may be recovering from dehydration if: You are urinating more often than before you started rehydrating. Your urine is pale yellow. Your energy level improves. You vomit less frequently. You have diarrhea less frequently. Your appetite improves or returns to normal. You feel less dizzy or less light-headed.   Your skin tone and color start to look more normal. Follow these instructions at home: Take over-the-counter and prescription medicines only as told by your health care provider. Do not take sodium  tablets. Doing this can lead to having too much sodium in your body (hypernatremia). Contact a health care provider if: You continue to have symptoms of mild or moderate dehydration, such as: Thirst. Dry lips. Slightly dry mouth. Dizziness. Dark urine or less urine than normal. Muscle cramps. You continue to vomit or have diarrhea. Get help right away if you: Have symptoms of dehydration that get worse. Have a fever. Have a severe headache. Have been vomiting and the following happens: Your vomiting gets worse or does not go away. Your vomit includes blood or green matter (bile). You cannot eat or drink without vomiting. Have problems with urination or bowel movements, such as: Diarrhea that gets worse or does not go away. Blood in your stool (feces). This may cause stool to look black and tarry. Not urinating, or urinating only a small amount of very dark urine, within 6-8 hours. Have trouble breathing. Have symptoms that get worse with treatment. These symptoms may represent a serious problem that is an emergency. Do not wait to see if the symptoms will go away. Get medical help right away. Call your local emergency services (911 in the [place] Do not drive yourself to the hospital. Summary Rehydration is the replacement of body fluids and minerals (electrolytes) that are lost during dehydration. Follow instructions from your health care provider for rehydration. The kind of fluid and amount you should drink depend on your condition. Slowly increase how much you drink until you have taken the amount recommended by your health care provider. Contact your health care provider if you continue to show signs of mild or moderate dehydration. This information is not intended to replace advice given to you by your health care provider. Make sure you discuss any questions you have with your health care provider. Document Revised: 01/04/2020 Document Reviewed: 11/14/2019 Elsevier Patient  Education  2023 [employer]  

## 2022-08-27 DIAGNOSIS — Z923 Personal history of irradiation: Secondary | ICD-10-CM | POA: Diagnosis not present

## 2022-08-27 DIAGNOSIS — C20 Malignant neoplasm of rectum: Secondary | ICD-10-CM | POA: Diagnosis not present

## 2022-08-28 DIAGNOSIS — Z9221 Personal history of antineoplastic chemotherapy: Secondary | ICD-10-CM | POA: Diagnosis not present

## 2022-08-28 DIAGNOSIS — C20 Malignant neoplasm of rectum: Secondary | ICD-10-CM | POA: Diagnosis not present

## 2022-08-29 ENCOUNTER — Inpatient Hospital Stay: Payer: BC Managed Care – PPO

## 2022-08-29 ENCOUNTER — Other Ambulatory Visit: Payer: Self-pay | Admitting: *Deleted

## 2022-08-29 VITALS — BP 125/87 | HR 60 | Temp 98.2°F | Resp 20 | Ht 71.0 in | Wt 203.1 lb

## 2022-08-29 DIAGNOSIS — C20 Malignant neoplasm of rectum: Secondary | ICD-10-CM

## 2022-08-29 DIAGNOSIS — Z95828 Presence of other vascular implants and grafts: Secondary | ICD-10-CM

## 2022-08-29 DIAGNOSIS — L409 Psoriasis, unspecified: Secondary | ICD-10-CM | POA: Diagnosis not present

## 2022-08-29 DIAGNOSIS — E860 Dehydration: Secondary | ICD-10-CM | POA: Diagnosis not present

## 2022-08-29 DIAGNOSIS — M109 Gout, unspecified: Secondary | ICD-10-CM | POA: Diagnosis not present

## 2022-08-29 DIAGNOSIS — I10 Essential (primary) hypertension: Secondary | ICD-10-CM | POA: Diagnosis not present

## 2022-08-29 DIAGNOSIS — Z923 Personal history of irradiation: Secondary | ICD-10-CM | POA: Diagnosis not present

## 2022-08-29 MED ORDER — HEPARIN SOD (PORK) LOCK FLUSH 100 UNIT/ML IV SOLN
500.0000 [IU] | Freq: Once | INTRAVENOUS | Status: AC
  Administered 2022-08-29: 500 [IU] via INTRAVENOUS

## 2022-08-29 MED ORDER — SODIUM CHLORIDE 0.9 % IV SOLN: INTRAVENOUS | Status: AC

## 2022-08-29 MED ORDER — SODIUM CHLORIDE 0.9% FLUSH
10.0000 mL | Freq: Once | INTRAVENOUS | Status: AC
  Administered 2022-08-29: 10 mL via INTRAVENOUS

## 2022-08-29 NOTE — Patient Instructions (Signed)
Rehydration, Adult Rehydration is the replacement of body fluids, salts, and minerals (electrolytes) that are lost during dehydration. Dehydration is when there is not enough water or other fluids in the body. This happens when you lose more fluids than you take in. Common causes of dehydration include: Not drinking enough fluids. This can occur when you are ill or doing activities that require a lot of energy, especially in hot weather. Conditions that cause loss of water or other fluids, such as diarrhea, vomiting, sweating, or urinating a lot. Other illnesses, such as fever or infection. Certain medicines, such as those that remove excess fluid from the body (diuretics). Symptoms of mild or moderate dehydration may include thirst, dry lips and mouth, and dizziness. Symptoms of severe dehydration may include increased heart rate, confusion, fainting, and not urinating. For severe dehydration, you may need to get fluids through an IV at the hospital. For mild or moderate dehydration, you can usually rehydrate at home by drinking certain fluids as told by your health care provider. What are the risks? Generally, rehydration is safe. However, taking in too much fluid (overhydration) can be a problem. This is rare. Overhydration can cause an electrolyte imbalance, kidney failure, or a decrease in salt (sodium) levels in the body. Supplies needed You will need an oral rehydration solution (ORS) if your health care provider tells you to use one. This is a drink to treat dehydration. It can be found in pharmacies and retail stores. How to rehydrate Fluids Follow instructions from your health care provider for rehydration. The kind of fluid and the amount you should drink depend on your condition. In general, you should choose drinks that you prefer. If told by your health care provider, drink an ORS. Make an ORS by following instructions on the package. Start by drinking small amounts, about  cup (120  mL) every 5-10 minutes. Slowly increase how much you drink until you have taken the amount recommended by your health care provider. Drink enough clear fluids to keep your urine pale yellow. If you were told to drink an ORS, finish it first, then start slowly drinking other clear fluids. Drink fluids such as: Water. This includes sparkling water and flavored water. Drinking only water can lead to having too little sodium in your body (hyponatremia). Follow the advice of your health care provider. Water from ice chips you suck on. Fruit juice with water you add to it (diluted). Sports drinks. Hot or cold herbal teas. Broth-based soups. Milk or milk products. Food Follow instructions from your health care provider about what to eat while you rehydrate. Your health care provider may recommend that you slowly begin eating regular foods in small amounts. Eat foods that contain a healthy balance of electrolytes, such as bananas, oranges, potatoes, tomatoes, and spinach. Avoid foods that are greasy or contain a lot of sugar. In some cases, you may get nutrition through a feeding tube that is passed through your nose and into your stomach (nasogastric tube, or NG tube). This may be done if you have uncontrolled vomiting or diarrhea. Beverages to avoid  Certain beverages may make dehydration worse. While you rehydrate, avoid drinking alcohol. How to tell if you are recovering from dehydration You may be recovering from dehydration if: You are urinating more often than before you started rehydrating. Your urine is pale yellow. Your energy level improves. You vomit less frequently. You have diarrhea less frequently. Your appetite improves or returns to normal. You feel less dizzy or less light-headed.   Your skin tone and color start to look more normal. Follow these instructions at home: Take over-the-counter and prescription medicines only as told by your health care provider. Do not take sodium  tablets. Doing this can lead to having too much sodium in your body (hypernatremia). Contact a health care provider if: You continue to have symptoms of mild or moderate dehydration, such as: Thirst. Dry lips. Slightly dry mouth. Dizziness. Dark urine or less urine than normal. Muscle cramps. You continue to vomit or have diarrhea. Get help right away if you: Have symptoms of dehydration that get worse. Have a fever. Have a severe headache. Have been vomiting and the following happens: Your vomiting gets worse or does not go away. Your vomit includes blood or green matter (bile). You cannot eat or drink without vomiting. Have problems with urination or bowel movements, such as: Diarrhea that gets worse or does not go away. Blood in your stool (feces). This may cause stool to look black and tarry. Not urinating, or urinating only a small amount of very dark urine, within 6-8 hours. Have trouble breathing. Have symptoms that get worse with treatment. These symptoms may represent a serious problem that is an emergency. Do not wait to see if the symptoms will go away. Get medical help right away. Call your local emergency services (911 in the [place] Do not drive yourself to the hospital. Summary Rehydration is the replacement of body fluids and minerals (electrolytes) that are lost during dehydration. Follow instructions from your health care provider for rehydration. The kind of fluid and amount you should drink depend on your condition. Slowly increase how much you drink until you have taken the amount recommended by your health care provider. Contact your health care provider if you continue to show signs of mild or moderate dehydration. This information is not intended to replace advice given to you by your health care provider. Make sure you discuss any questions you have with your health care provider. Document Revised: 01/04/2020 Document Reviewed: 11/14/2019 Elsevier Patient  Education  2023 [employer]  

## 2022-09-01 ENCOUNTER — Other Ambulatory Visit (HOSPITAL_COMMUNITY): Payer: BC Managed Care – PPO

## 2022-09-01 ENCOUNTER — Ambulatory Visit
Admission: RE | Admit: 2022-09-01 | Discharge: 2022-09-01 | Disposition: A | Discharge status: Home / Self Care | Payer: BC Managed Care – PPO | Source: Ambulatory Visit | Attending: Radiation Oncology | Admitting: Radiation Oncology

## 2022-09-01 NOTE — Progress Notes (Addendum)
  Radiation Oncology         [phone] ________________________________  Name: Kenneth [name] MRN: [id number]  Date of Service: 09/01/2022  DOB: 02-24-1961  Post Treatment Telephone Note  Diagnosis:  Stage IIIC, cT3N2M0, adenocarcinoma of the rectum  Intent: Curative  Radiation Treatment Dates: 06/16/2022 through 07/24/2022 Site Technique Total Dose (Gy) Dose per Fx (Gy) Completed Fx Beam Energies  Rectum: Rectum 3D 45/45 1.8 25/25 10X  Rectum: Rectum_Bst 3D 5.4/5.4 1.8 3/3 10X  (as documented in provider EOT note)   The patient was not available for call today. I spoke w/ patient's spouse Mrs. Kenneth [name] and verified her identity.  Symptoms of fatigue have improved since completing therapy.  Symptoms of skin changes have  improved since completing therapy.   Symptoms of bladder changes have  improved since completing therapy.  Symptoms of bowel changes have  improved since completing therapy.  The patient is scheduled for follow up or surgery with colorectal surgeon Dr. Morton Stall.  The patient has scheduled follow up with his medical oncologist Dr. [name] and their surgeon Dr. Morton Stall at [facility]  for ongoing surveillance. He  was encouraged to call if he  develop concerns or questions regarding radiation.    Leandra Kern, LPN

## 2022-09-02 ENCOUNTER — Inpatient Hospital Stay: Payer: BC Managed Care – PPO

## 2022-09-02 ENCOUNTER — Inpatient Hospital Stay: Payer: BC Managed Care – PPO | Admitting: Oncology

## 2022-09-02 ENCOUNTER — Encounter: Payer: Self-pay | Admitting: Oncology

## 2022-09-02 VITALS — BP 113/79 | HR 66 | Resp 20

## 2022-09-02 VITALS — BP 119/79 | HR 83 | Temp 98.1°F | Resp 18 | Ht 71.0 in | Wt 208.4 lb

## 2022-09-02 DIAGNOSIS — L409 Psoriasis, unspecified: Secondary | ICD-10-CM | POA: Diagnosis not present

## 2022-09-02 DIAGNOSIS — C20 Malignant neoplasm of rectum: Secondary | ICD-10-CM

## 2022-09-02 DIAGNOSIS — Z95828 Presence of other vascular implants and grafts: Secondary | ICD-10-CM

## 2022-09-02 DIAGNOSIS — I10 Essential (primary) hypertension: Secondary | ICD-10-CM | POA: Diagnosis not present

## 2022-09-02 DIAGNOSIS — M109 Gout, unspecified: Secondary | ICD-10-CM | POA: Diagnosis not present

## 2022-09-02 DIAGNOSIS — Z923 Personal history of irradiation: Secondary | ICD-10-CM | POA: Diagnosis not present

## 2022-09-02 DIAGNOSIS — E860 Dehydration: Secondary | ICD-10-CM | POA: Diagnosis not present

## 2022-09-02 LAB — CMP (CANCER CENTER ONLY)
ALT: 24 U/L (ref 0–44)
AST: 32 U/L (ref 15–41)
Albumin: 4.2 g/dL (ref 3.5–5.0)
Alkaline Phosphatase: 176 U/L — ABNORMAL HIGH (ref 38–126)
Anion gap: 9 (ref 5–15)
BUN: 14 mg/dL (ref 8–23)
CO2: 28 mmol/L (ref 22–32)
Calcium: 9.8 mg/dL (ref 8.9–10.3)
Chloride: 104 mmol/L (ref 98–111)
Creatinine: 1.16 mg/dL (ref 0.61–1.24)
GFR, Estimated: >60 mL/min (ref >60)
Glucose, Bld: 141 mg/dL — ABNORMAL HIGH (ref 70–99)
Potassium: 3.9 mmol/L (ref 3.5–5.1)
Sodium: 141 mmol/L (ref 135–145)
Total Bilirubin: 0.8 mg/dL (ref 0.3–1.2)
Total Protein: 6.7 g/dL (ref 6.5–8.1)

## 2022-09-02 LAB — CBC WITH DIFFERENTIAL (CANCER CENTER ONLY)
Abs Immature Granulocytes: 0.01 10*3/uL (ref 0.00–0.07)
Basophils Absolute: 0 10*3/uL (ref 0.0–0.1)
Basophils Relative: 1 %
Eosinophils Absolute: 0.1 10*3/uL (ref 0.0–0.5)
Eosinophils Relative: 4 %
HCT: 35.9 % — ABNORMAL LOW (ref 39.0–52.0)
Hemoglobin: 11.8 g/dL — ABNORMAL LOW (ref 13.0–17.0)
Immature Granulocytes: 0 %
Lymphocytes Relative: 14 %
Lymphs Abs: 0.5 10*3/uL — ABNORMAL LOW (ref 0.7–4.0)
MCH: 33 pg (ref 26.0–34.0)
MCHC: 32.9 g/dL (ref 30.0–36.0)
MCV: 100.3 fL — ABNORMAL HIGH (ref 80.0–100.0)
Monocytes Absolute: 0.3 10*3/uL (ref 0.1–1.0)
Monocytes Relative: 8 %
Neutro Abs: 2.8 10*3/uL (ref 1.7–7.7)
Neutrophils Relative %: 73 %
Platelet Count: 99 10*3/uL — ABNORMAL LOW (ref 150–400)
RBC: 3.58 MIL/uL — ABNORMAL LOW (ref 4.22–5.81)
RDW: 12.6 % (ref 11.5–15.5)
WBC Count: 3.7 10*3/uL — ABNORMAL LOW (ref 4.0–10.5)
nRBC: 0 % (ref 0.0–0.2)

## 2022-09-02 MED ORDER — SODIUM CHLORIDE 0.9% FLUSH
10.0000 mL | Freq: Once | INTRAVENOUS | Status: AC
  Administered 2022-09-02: 10 mL via INTRAVENOUS

## 2022-09-02 MED ORDER — HEPARIN SOD (PORK) LOCK FLUSH 100 UNIT/ML IV SOLN
500.0000 [IU] | Freq: Once | INTRAVENOUS | Status: AC
  Administered 2022-09-02: 500 [IU] via INTRAVENOUS

## 2022-09-02 MED ORDER — SODIUM CHLORIDE 0.9 % IV SOLN: INTRAVENOUS | Status: AC

## 2022-09-02 NOTE — Patient Instructions (Signed)
Rehydration, Adult Rehydration is the replacement of body fluids, salts, and minerals (electrolytes) that are lost during dehydration. Dehydration is when there is not enough water or other fluids in the body. This happens when you lose more fluids than you take in. Common causes of dehydration include: Not drinking enough fluids. This can occur when you are ill or doing activities that require a lot of energy, especially in hot weather. Conditions that cause loss of water or other fluids, such as diarrhea, vomiting, sweating, or urinating a lot. Other illnesses, such as fever or infection. Certain medicines, such as those that remove excess fluid from the body (diuretics). Symptoms of mild or moderate dehydration may include thirst, dry lips and mouth, and dizziness. Symptoms of severe dehydration may include increased heart rate, confusion, fainting, and not urinating. For severe dehydration, you may need to get fluids through an IV at the hospital. For mild or moderate dehydration, you can usually rehydrate at home by drinking certain fluids as told by your health care provider. What are the risks? Generally, rehydration is safe. However, taking in too much fluid (overhydration) can be a problem. This is rare. Overhydration can cause an electrolyte imbalance, kidney failure, or a decrease in salt (sodium) levels in the body. Supplies needed You will need an oral rehydration solution (ORS) if your health care provider tells you to use one. This is a drink to treat dehydration. It can be found in pharmacies and retail stores. How to rehydrate Fluids Follow instructions from your health care provider for rehydration. The kind of fluid and the amount you should drink depend on your condition. In general, you should choose drinks that you prefer. If told by your health care provider, drink an ORS. Make an ORS by following instructions on the package. Start by drinking small amounts, about  cup (120  mL) every 5-10 minutes. Slowly increase how much you drink until you have taken the amount recommended by your health care provider. Drink enough clear fluids to keep your urine pale yellow. If you were told to drink an ORS, finish it first, then start slowly drinking other clear fluids. Drink fluids such as: Water. This includes sparkling water and flavored water. Drinking only water can lead to having too little sodium in your body (hyponatremia). Follow the advice of your health care provider. Water from ice chips you suck on. Fruit juice with water you add to it (diluted). Sports drinks. Hot or cold herbal teas. Broth-based soups. Milk or milk products. Food Follow instructions from your health care provider about what to eat while you rehydrate. Your health care provider may recommend that you slowly begin eating regular foods in small amounts. Eat foods that contain a healthy balance of electrolytes, such as bananas, oranges, potatoes, tomatoes, and spinach. Avoid foods that are greasy or contain a lot of sugar. In some cases, you may get nutrition through a feeding tube that is passed through your nose and into your stomach (nasogastric tube, or NG tube). This may be done if you have uncontrolled vomiting or diarrhea. Beverages to avoid  Certain beverages may make dehydration worse. While you rehydrate, avoid drinking alcohol. How to tell if you are recovering from dehydration You may be recovering from dehydration if: You are urinating more often than before you started rehydrating. Your urine is pale yellow. Your energy level improves. You vomit less frequently. You have diarrhea less frequently. Your appetite improves or returns to normal. You feel less dizzy or less light-headed.   Your skin tone and color start to look more normal. Follow these instructions at home: Take over-the-counter and prescription medicines only as told by your health care provider. Do not take sodium  tablets. Doing this can lead to having too much sodium in your body (hypernatremia). Contact a health care provider if: You continue to have symptoms of mild or moderate dehydration, such as: Thirst. Dry lips. Slightly dry mouth. Dizziness. Dark urine or less urine than normal. Muscle cramps. You continue to vomit or have diarrhea. Get help right away if you: Have symptoms of dehydration that get worse. Have a fever. Have a severe headache. Have been vomiting and the following happens: Your vomiting gets worse or does not go away. Your vomit includes blood or green matter (bile). You cannot eat or drink without vomiting. Have problems with urination or bowel movements, such as: Diarrhea that gets worse or does not go away. Blood in your stool (feces). This may cause stool to look black and tarry. Not urinating, or urinating only a small amount of very dark urine, within 6-8 hours. Have trouble breathing. Have symptoms that get worse with treatment. These symptoms may represent a serious problem that is an emergency. Do not wait to see if the symptoms will go away. Get medical help right away. Call your local emergency services (911 in the [place] Do not drive yourself to the hospital. Summary Rehydration is the replacement of body fluids and minerals (electrolytes) that are lost during dehydration. Follow instructions from your health care provider for rehydration. The kind of fluid and amount you should drink depend on your condition. Slowly increase how much you drink until you have taken the amount recommended by your health care provider. Contact your health care provider if you continue to show signs of mild or moderate dehydration. This information is not intended to replace advice given to you by your health care provider. Make sure you discuss any questions you have with your health care provider. Document Revised: 01/04/2020 Document Reviewed: 11/14/2019 Elsevier Patient  Education  2023 [employer]  

## 2022-09-02 NOTE — Progress Notes (Signed)
[place] OFFICE PROGRESS NOTE   Diagnosis: Rectal cancer  INTERVAL HISTORY:   Kenneth [name] returns as scheduled.  He continues IV fluids twice weekly for a high output ileostomy.  In general he is feeling well.  No rectal pain or bleeding.  Stable neuropathy symptoms in the hands and feet.  Objective:  Vital signs in last 24 hours:  Blood pressure 119/79, pulse 83, temperature 98.1 F (36.7 C), temperature source Oral, resp. rate 18, height '5\' 11"'  (1.803 m), weight 208 lb 6.4 oz (94.5 kg), SpO2 100 %.    HEENT: No thrush or ulcers.  Mucous membranes are moist. Resp: Lungs clear bilaterally. Cardio: Regular rate and rhythm. GI: Right abdomen ileostomy.  No hepatomegaly. Vascular: No leg edema. Skin: Palms without erythema. Port-A-Cath without erythema.   Lab Results:  Lab Results  Component Value Date   WBC 3.7 (L) 09/02/2022   HGB 11.8 (L) 09/02/2022   HCT 35.9 (L) 09/02/2022   MCV 100.3 (H) 09/02/2022   PLT 99 (L) 09/02/2022   NEUTROABS 2.8 09/02/2022    Imaging:  No results found.  Medications: I have reviewed the patient's current medications.  Assessment/Plan: Rectal cancer-mass at 11 cm on proctoscopy by Dr. Morton Stall 02/04/2022 Colonoscopy 12/26/2021-obstructing mass at the rectosigmoid measured at 15 cm, biopsy invasive moderate to poorly differentiated adenocarcinoma, mismatch repair protein expression intact CTs 12/31/2021-masslike circumferential thickening of the distal sigmoid colon with mildly enlarged pericolonic lymph nodes, numerous subcentimeter hypodense liver lesions, splenomegaly MRI abdomen 01/07/2022-innumerable T2 hyperintense liver lesions consistent with cysts, spleen unremarkable, no ascites or adenopathy, moderate colonic fecal retention, no bowel obstruction MRI pelvis 01/22/2022-T3cN2 tumor above and below the peritoneal reflection, tumor 11.3 cm from the anal verge and 6.1 cm from the sphincter complex, multiple enlarged  perirectal nodes, 1.1 cm node versus tumor deposit at the right aspect of the tumor Diverting loop ileostomy 01/15/2022 Cycle 1 FOLFOX 02/17/2022 Cycle 2 FOLFOX 03/04/2022 Cycle 3 FOLFOX held on 03/17/2022 due to neutropenia Cycle 3 FOLFOX 03/17/2022, Udenyca Treatment held 03/31/2022 due to dehydration Cycle 4 FOLFOX 04/07/2022 Cycle 5 FOLFOX 04/22/2022, oxaliplatin dose reduced due to thrombocytopenia Cycle 6 FOLFOX 05/05/2022 Cycle 7 FOLFOX 05/19/2022 Cycle 8 FOLFOX held secondary to patient preference and toxicity (diarrhea/weight loss/neuropathy symptoms) 06/16/2022 radiation/Xeloda-07/24/2022 MRI 08/27/2022-no suspicious lymph nodes; significant reduction in size with near complete resolution of previously identified tumor deposit versus node along the right aspect of the tumor; overall reduction in bulk and signal of mucinous high rectal malignancy, mrTRG-3 Gout Psoriasis Hypertension Left scalene node versus cyst/lipoma on exam 02/03/2022-CT neck 02/10/2022 with 9.5 mm lymph node in the left supraclavicular region posterior to the sternocleidomastoid muscle.  No other prominent lymph nodes in the neck.  Biopsy 02/12/2022 compatible with benign lymph node, negative for metastatic carcinoma.  Disposition: Kenneth [name] appears unchanged.  Recent MRI at [facility] shows improvement.  He is scheduled for surgery with Dr. Morton Stall 10/01/2022.  We will continue IV fluids twice weekly for the high output ileostomy.  We will plan to see him in follow-up 2 to 3 weeks after surgery.  He will contact the office with his discharge date from the hospital so we can make arrangements for continuation of twice weekly IV fluids after surgery.  Patient seen with Dr. [name].    [name] ANP/GNP-BC   09/02/2022  11:53 AM This was a shared visit with [name].  We reviewed the restaging MRI findings with Kenneth [name].  He is scheduled for a restaging sigmoidoscopy.  We will plan to see him after the rectal surgery.   We continue to administer IV fluids the setting of a high output ileostomy.  I was present for greater than 50% of today's visit.  Informed medical decision making.  [name], MD

## 2022-09-04 ENCOUNTER — Ambulatory Visit: Payer: BC Managed Care – PPO | Attending: Radiation Oncology | Admitting: Physical Therapy

## 2022-09-04 ENCOUNTER — Other Ambulatory Visit: Payer: Self-pay | Admitting: *Deleted

## 2022-09-04 DIAGNOSIS — R293 Abnormal posture: Secondary | ICD-10-CM | POA: Diagnosis not present

## 2022-09-04 DIAGNOSIS — C20 Malignant neoplasm of rectum: Secondary | ICD-10-CM

## 2022-09-04 DIAGNOSIS — M6281 Muscle weakness (generalized): Secondary | ICD-10-CM | POA: Insufficient documentation

## 2022-09-04 DIAGNOSIS — R279 Unspecified lack of coordination: Secondary | ICD-10-CM | POA: Diagnosis not present

## 2022-09-04 NOTE — Therapy (Addendum)
OUTPATIENT PHYSICAL THERAPY MALE PELVIC TREATMENT   Patient Name: Kenneth [name] MRN: [id number] DOB:September 07, 1961, 61 y.o., male Today's Date: 09/04/2022   PT End of Session - 09/04/22 1015     Visit Number 2    Date for PT Re-Evaluation 09/17/22    Authorization Type BCBS    PT Start Time 1015    PT Stop Time 1058    PT Time Calculation (min) 43 min    Activity Tolerance Patient tolerated treatment well    Behavior During Therapy [facility] for tasks assessed/performed             Past Medical History:  Diagnosis Date   Rectal cancer (HCC) 01/09/2022   Past Surgical History:  Procedure Laterality Date   ILEOSTOMY  01/15/2022   IR IMAGING GUIDED PORT INSERTION  02/12/2022   IR US GUIDE BX ASP/DRAIN  02/12/2022   Patient Active Problem List   Diagnosis Date Noted   Chronic kidney disease, stage 3 unspecified (HCC) 06/02/2022   Low testosterone 06/02/2022   ED (erectile dysfunction) of organic origin 06/02/2022   Lesion of liver 06/02/2022   Rectal cancer (HCC) 02/06/2022   Gout of right elbow 10/29/2021   Bursitis of right hip 08/15/2020   Olecranon bursitis, right elbow 05/22/2020   Elbow mass, left 03/12/2020   Calcific tendinitis of left elbow 05/23/2019   Degenerative joint disease of knee, left 10/27/2018   Calcific Achilles tendinitis 06/20/2015   HYPOGONADISM 04/13/2009   LOW HDL 04/13/2009   ANKLE PAIN, RIGHT 04/13/2009   ERECTILE DYSFUNCTION 03/02/2009   ALLERGIC RHINITIS 03/02/2009   FATIGUE 03/02/2009   ESSENTIAL HYPERTENSION 04/05/2008   PSORIASIS 06/15/2007   TENDINITIS, ELBOW 06/15/2007   CHONDROMALACIA 06/15/2007   LIVER FUNCTION TESTS, ABNORMAL 06/15/2007    PCP: Jarrett Soho, PA-C  REFERRING PROVIDER: Ronny Bacon, PA-C   REFERRING DIAG: C20 (ICD-10-CM) - Rectal cancer (HCC)  THERAPY DIAG:  Muscle weakness (generalized)  Abnormal posture  Unspecified lack of coordination  Rationale for Evaluation and Treatment  Rehabilitation  ONSET DATE: 01/22/22  SUBJECTIVE:                                                                                                                                                                                           SUBJECTIVE STATEMENT: Pt reports he had decline in strength and endurance with chemo but is feeling better now, just started radiation yesterday. Pt presents for education and baseline eval for rectal CA.  Fluid intake: "I drink water all day"   PAIN:  Are you having pain? No  PRECAUTIONS: Other: radiation treatment and has ostomy  WEIGHT BEARING  RESTRICTIONS No  FALLS:  Has patient fallen in last 6 months? No  LIVING ENVIRONMENT: Lives with: lives with their family Lives in: House/apartment Stairs: Yes: External: 5 steps  OCCUPATION: not currently working  PLOF: Independent  PATIENT GOALS to maintain as much strength as possible and improve  PERTINENT HISTORY:  ileostomy, positive Cologuard test in September 2022., Rectal cancer-mass at 11 cm on proctoscopy by Dr. Byrd Hesselbach 02/04/2022, MRI pelvis 01/22/2022-T3cN2 tumor above and below the peritoneal reflection, tumor 11.3 cm from the anal verge and 6.1 cm from the sphincter complex, multiple enlarged perirectal nodes, 1.1 cm node versus tumor deposit at the right aspect of the tumor, Diverting loop ileostomy 01/15/2022, 7 cycles of FOLFOX 02/17/22-05/19/22 held the 8th; 06/16/22 radiation/Xeloda Sexual abuse: NO   BOWEL MOVEMENT Pain with bowel movement: No - ileostomy   URINATION Pain with urination: No Fully empty bladder: Yes:   Stream: Strong Urgency: No Frequency: normal for him no more than every 3 hours, sometimes 1x per night but not regular  Leakage:  no Pads: No  INTERCOURSE Pain with intercourse:  not active    OBJECTIVE:   DIAGNOSTIC FINDINGS:    COGNITION:  Overall cognitive status: Within functional limits for tasks assessed     SENSATION:  Light touch: Deficits Lt first  digit tingling   Proprioception: Appears intact  MUSCLE LENGTH: Bil hamstrings and adductors limited by 25%   POSTURE: rounded shoulders   LUMBARAROM/PROM  A/PROM A/PROM  eval  Flexion Limited by 50%  Extension WFL  Right lateral flexion Limited y 25%  Left lateral flexion Limited by 25%  Right rotation WFL  Left rotation WFL   (Blank rows = not tested)  LOWER EXTREMITY AROM/PROM:  WFL  LOWER EXTREMITY MMT:   Bil hips grossly 4+/5; knees and ankles 5/5  PALPATION: GENERAL mild TTP around ostomy but no additional tenderness of pain.               External Perineal Exam deferred              Internal Pelvic Floor deferred  Patient confirms identification and approves PT to assess internal pelvic floor and treatment No  PELVIC MMT:   MMT eval  Internal Anal Sphincter   External Anal Sphincter   Puborectalis   Diastasis Recti   (Blank rows = not tested)   TONE: Deferred until after radiation if needed  TODAY'S TREATMENT   09/04/2022  Pt educated on updated HEP, safety with strengthening, breathing mechanics, no straining with band use and breathing with all workouts to decrease strain at core and pelvic floor. Pt denied additional questions about HEP and strengthening at home. Pt encouraged to also improve walking program to shorter times for 2-3x per day as tolerated as he reports he can walk 5-54mins at a time now but does this once per day, PT recommended as wife goes with pt, to attempt this more often to improve cardiac tolerance to activity. Pt agreed to attempt, also PT educated to base this off fatigue and rest when needed. Pt agreed. Pt had questions about strength training with weights, encourage to attempt bands first to see tolerance and it is important not to strain at abdomen as pt does still have ostomy, pt agreed. PT recommend bands first and insure proper form and technique before any increases in resistance, exhale with exertion, and if pt does di  sire weights starting with 2-5# at most to enforce no straining. Pt encouraged to attempt these in  future appointments to make sure form was correct. Pt agreed.  Pt also had questions about medications/treatments for neuropathy or improving muscle growth and PT recommended disusing this with MD as PT cannot make recommendations for any medications and pt agreed.  Pt demonstrated one rep of updated HEP with good form with bands but reports he will continue to work on HEP on his own.    PATIENT EDUCATION:  Education details: [id number] Person educated: Patient Education method: Programmer, multimedia, Demonstration, Actor cues, Verbal cues, and Handouts Education comprehension: verbalized understanding and returned demonstration   HOME EXERCISE PROGRAM: [email or web address]  ASSESSMENT:  CLINICAL IMPRESSION: Patient session focused on education on HEP updates, answering pt questions about safe progressions with workouts and to discuss any medication or supplement questions with his medical team as PT is unable to make recommendations on this. Pt reports he continues to be as active as possible and doing HEP 5x per week, would love to do more strength training with weights as this is his interest but PT encouraged pt to attempt with stronger bands first and judge progressions based on pt pt's tolerance to activity and energy levels. Also with pt having ostomy it is important not to strain or cause increased pressure at core as this is a weak point in core and pt agreed. Pt also educated on proper breathing mechanics to further decrease stain at core. Pt demonstrated good technique of this. Pt also demonstrated one rep of updated HEP during session and had good form, again encouraged to attempt with band use.  Pt would benefit from additional PT to further address deficits.     OBJECTIVE IMPAIRMENTS decreased endurance, decreased mobility, decreased strength, impaired flexibility, improper body mechanics, and postural  dysfunction.   ACTIVITY LIMITATIONS carrying, lifting, standing, squatting, transfers, and locomotion level  PARTICIPATION LIMITATIONS: cleaning, laundry, interpersonal relationship, shopping, community activity, and yard work  PERSONAL FACTORS Fitness, Time since onset of injury/illness/exacerbation, and 1 comorbidity: medical history  are also affecting patient's functional outcome.   REHAB POTENTIAL: Good  CLINICAL DECISION MAKING: Stable/uncomplicated  EVALUATION COMPLEXITY: Low   GOALS: Goals reviewed with patient? Yes  SHORT TERM GOALS: Target date: 07/15/2022   Pt to be I with HEP.  Baseline: Goal status: MET  2.  Pt will be able to complete gentle workout for 20 minutes without discomfort  Baseline:  Goal status: MET   LONG TERM GOALS: Target date: 07/29/2022   Pt to be I with advanced HEP.  Baseline:  Goal status: on going  2.  Pt to demonstrate at least 5/5 bil hip strength for improved pelvic stability and functional squats without pain or compensatory strategies.  Baseline:  Goal status: on going  3.  Pt will be able to complete gentle workout for 30 minutes without discomfort  Baseline:  Goal status: on going   4.  Pt to demonstrate improved coordination with pelvic floor and breathing mechanics with body weight squats x10 without pain or compensatory strategies for improved pelvic stability Baseline:  Goal status: on going  5.  Pt to be I with pelvic relaxation/contraction/bulge/relax with minimal cues for regular bowel and bladder habits post ostomy reversal.  Baseline:  Goal status: on going    PLAN: PT FREQUENCY: 1x/week  PT DURATION:  5 sessions  PLANNED INTERVENTIONS: Therapeutic exercises, Therapeutic activity, Neuromuscular re-education, Patient/Family education, Self Care, Joint mobilization, Aquatic Therapy, Spinal mobilization, Cryotherapy, Moist heat, scar mobilization, Taping, Biofeedback, and Manual therapy  PLAN FOR NEXT SESSION:  internal as needed, global  strengthening and stretching post radiation, pelvic floor relaxation techniques, voiding mechanics, breathing mechanics   Otelia Sergeant, PT, DPT 09/04/2310:09 AM   PHYSICAL THERAPY DISCHARGE SUMMARY  Visits from Start of Care: 2  Current functional level related to goals / functional outcomes: Unable for formally reassess as pt has not returned since last visit   Remaining deficits: Unable to formally reassess    Education / Equipment: HEP   Patient agrees to discharge. Patient goals were not met. Patient is being discharged due to not returning since the last visit.  Otelia Sergeant, PT, DPT 02/03/253:18 PM

## 2022-09-05 ENCOUNTER — Inpatient Hospital Stay: Payer: BC Managed Care – PPO

## 2022-09-05 VITALS — BP 119/80 | HR 69 | Temp 98.3°F | Resp 20

## 2022-09-05 DIAGNOSIS — C20 Malignant neoplasm of rectum: Secondary | ICD-10-CM

## 2022-09-05 DIAGNOSIS — E860 Dehydration: Secondary | ICD-10-CM | POA: Diagnosis not present

## 2022-09-05 DIAGNOSIS — M109 Gout, unspecified: Secondary | ICD-10-CM | POA: Diagnosis not present

## 2022-09-05 DIAGNOSIS — L409 Psoriasis, unspecified: Secondary | ICD-10-CM | POA: Diagnosis not present

## 2022-09-05 DIAGNOSIS — Z923 Personal history of irradiation: Secondary | ICD-10-CM | POA: Diagnosis not present

## 2022-09-05 DIAGNOSIS — I10 Essential (primary) hypertension: Secondary | ICD-10-CM | POA: Diagnosis not present

## 2022-09-05 DIAGNOSIS — Z95828 Presence of other vascular implants and grafts: Secondary | ICD-10-CM

## 2022-09-05 MED ORDER — HEPARIN SOD (PORK) LOCK FLUSH 100 UNIT/ML IV SOLN
500.0000 [IU] | Freq: Once | INTRAVENOUS | Status: AC
  Administered 2022-09-05: 500 [IU] via INTRAVENOUS

## 2022-09-05 MED ORDER — SODIUM CHLORIDE 0.9 % IV SOLN: INTRAVENOUS | Status: AC

## 2022-09-05 MED ORDER — SODIUM CHLORIDE 0.9% FLUSH
10.0000 mL | Freq: Once | INTRAVENOUS | Status: AC
  Administered 2022-09-05: 10 mL via INTRAVENOUS

## 2022-09-05 NOTE — Patient Instructions (Signed)
Rehydration, Adult Rehydration is the replacement of body fluids, salts, and minerals (electrolytes) that are lost during dehydration. Dehydration is when there is not enough water or other fluids in the body. This happens when you lose more fluids than you take in. Common causes of dehydration include: Not drinking enough fluids. This can occur when you are ill or doing activities that require a lot of energy, especially in hot weather. Conditions that cause loss of water or other fluids, such as diarrhea, vomiting, sweating, or urinating a lot. Other illnesses, such as fever or infection. Certain medicines, such as those that remove excess fluid from the body (diuretics). Symptoms of mild or moderate dehydration may include thirst, dry lips and mouth, and dizziness. Symptoms of severe dehydration may include increased heart rate, confusion, fainting, and not urinating. For severe dehydration, you may need to get fluids through an IV at the hospital. For mild or moderate dehydration, you can usually rehydrate at home by drinking certain fluids as told by your health care provider. What are the risks? Generally, rehydration is safe. However, taking in too much fluid (overhydration) can be a problem. This is rare. Overhydration can cause an electrolyte imbalance, kidney failure, or a decrease in salt (sodium) levels in the body. Supplies needed You will need an oral rehydration solution (ORS) if your health care provider tells you to use one. This is a drink to treat dehydration. It can be found in pharmacies and retail stores. How to rehydrate Fluids Follow instructions from your health care provider for rehydration. The kind of fluid and the amount you should drink depend on your condition. In general, you should choose drinks that you prefer. If told by your health care provider, drink an ORS. Make an ORS by following instructions on the package. Start by drinking small amounts, about  cup (120  mL) every 5-10 minutes. Slowly increase how much you drink until you have taken the amount recommended by your health care provider. Drink enough clear fluids to keep your urine pale yellow. If you were told to drink an ORS, finish it first, then start slowly drinking other clear fluids. Drink fluids such as: Water. This includes sparkling water and flavored water. Drinking only water can lead to having too little sodium in your body (hyponatremia). Follow the advice of your health care provider. Water from ice chips you suck on. Fruit juice with water you add to it (diluted). Sports drinks. Hot or cold herbal teas. Broth-based soups. Milk or milk products. Food Follow instructions from your health care provider about what to eat while you rehydrate. Your health care provider may recommend that you slowly begin eating regular foods in small amounts. Eat foods that contain a healthy balance of electrolytes, such as bananas, oranges, potatoes, tomatoes, and spinach. Avoid foods that are greasy or contain a lot of sugar. In some cases, you may get nutrition through a feeding tube that is passed through your nose and into your stomach (nasogastric tube, or NG tube). This may be done if you have uncontrolled vomiting or diarrhea. Beverages to avoid  Certain beverages may make dehydration worse. While you rehydrate, avoid drinking alcohol. How to tell if you are recovering from dehydration You may be recovering from dehydration if: You are urinating more often than before you started rehydrating. Your urine is pale yellow. Your energy level improves. You vomit less frequently. You have diarrhea less frequently. Your appetite improves or returns to normal. You feel less dizzy or less light-headed.   Your skin tone and color start to look more normal. Follow these instructions at home: Take over-the-counter and prescription medicines only as told by your health care provider. Do not take sodium  tablets. Doing this can lead to having too much sodium in your body (hypernatremia). Contact a health care provider if: You continue to have symptoms of mild or moderate dehydration, such as: Thirst. Dry lips. Slightly dry mouth. Dizziness. Dark urine or less urine than normal. Muscle cramps. You continue to vomit or have diarrhea. Get help right away if you: Have symptoms of dehydration that get worse. Have a fever. Have a severe headache. Have been vomiting and the following happens: Your vomiting gets worse or does not go away. Your vomit includes blood or green matter (bile). You cannot eat or drink without vomiting. Have problems with urination or bowel movements, such as: Diarrhea that gets worse or does not go away. Blood in your stool (feces). This may cause stool to look black and tarry. Not urinating, or urinating only a small amount of very dark urine, within 6-8 hours. Have trouble breathing. Have symptoms that get worse with treatment. These symptoms may represent a serious problem that is an emergency. Do not wait to see if the symptoms will go away. Get medical help right away. Call your local emergency services (911 in the [place] Do not drive yourself to the hospital. Summary Rehydration is the replacement of body fluids and minerals (electrolytes) that are lost during dehydration. Follow instructions from your health care provider for rehydration. The kind of fluid and amount you should drink depend on your condition. Slowly increase how much you drink until you have taken the amount recommended by your health care provider. Contact your health care provider if you continue to show signs of mild or moderate dehydration. This information is not intended to replace advice given to you by your health care provider. Make sure you discuss any questions you have with your health care provider. Document Revised: 01/04/2020 Document Reviewed: 11/14/2019 Elsevier Patient  Education  2023 [employer]  

## 2022-09-08 ENCOUNTER — Ambulatory Visit: Payer: BC Managed Care – PPO | Admitting: Physical Therapy

## 2022-09-09 ENCOUNTER — Inpatient Hospital Stay: Payer: BC Managed Care – PPO

## 2022-09-09 VITALS — BP 113/77 | HR 63 | Temp 97.8°F | Resp 20

## 2022-09-09 DIAGNOSIS — Z95828 Presence of other vascular implants and grafts: Secondary | ICD-10-CM

## 2022-09-09 DIAGNOSIS — E860 Dehydration: Secondary | ICD-10-CM | POA: Diagnosis not present

## 2022-09-09 DIAGNOSIS — C20 Malignant neoplasm of rectum: Secondary | ICD-10-CM | POA: Diagnosis not present

## 2022-09-09 DIAGNOSIS — L409 Psoriasis, unspecified: Secondary | ICD-10-CM | POA: Diagnosis not present

## 2022-09-09 DIAGNOSIS — Z923 Personal history of irradiation: Secondary | ICD-10-CM | POA: Diagnosis not present

## 2022-09-09 DIAGNOSIS — I10 Essential (primary) hypertension: Secondary | ICD-10-CM | POA: Diagnosis not present

## 2022-09-09 DIAGNOSIS — M109 Gout, unspecified: Secondary | ICD-10-CM | POA: Diagnosis not present

## 2022-09-09 MED ORDER — HEPARIN SOD (PORK) LOCK FLUSH 100 UNIT/ML IV SOLN
500.0000 [IU] | Freq: Once | INTRAVENOUS | Status: AC
  Administered 2022-09-09: 500 [IU] via INTRAVENOUS

## 2022-09-09 MED ORDER — SODIUM CHLORIDE 0.9 % IV SOLN: INTRAVENOUS | Status: AC

## 2022-09-09 MED ORDER — SODIUM CHLORIDE 0.9% FLUSH
10.0000 mL | Freq: Once | INTRAVENOUS | Status: AC
  Administered 2022-09-09: 10 mL via INTRAVENOUS

## 2022-09-09 NOTE — Patient Instructions (Signed)
Rehydration, Adult Rehydration is the replacement of body fluids, salts, and minerals (electrolytes) that are lost during dehydration. Dehydration is when there is not enough water or other fluids in the body. This happens when you lose more fluids than you take in. Common causes of dehydration include: Not drinking enough fluids. This can occur when you are ill or doing activities that require a lot of energy, especially in hot weather. Conditions that cause loss of water or other fluids, such as diarrhea, vomiting, sweating, or urinating a lot. Other illnesses, such as fever or infection. Certain medicines, such as those that remove excess fluid from the body (diuretics). Symptoms of mild or moderate dehydration may include thirst, dry lips and mouth, and dizziness. Symptoms of severe dehydration may include increased heart rate, confusion, fainting, and not urinating. For severe dehydration, you may need to get fluids through an IV at the hospital. For mild or moderate dehydration, you can usually rehydrate at home by drinking certain fluids as told by your health care provider. What are the risks? Generally, rehydration is safe. However, taking in too much fluid (overhydration) can be a problem. This is rare. Overhydration can cause an electrolyte imbalance, kidney failure, or a decrease in salt (sodium) levels in the body. Supplies needed You will need an oral rehydration solution (ORS) if your health care provider tells you to use one. This is a drink to treat dehydration. It can be found in pharmacies and retail stores. How to rehydrate Fluids Follow instructions from your health care provider for rehydration. The kind of fluid and the amount you should drink depend on your condition. In general, you should choose drinks that you prefer. If told by your health care provider, drink an ORS. Make an ORS by following instructions on the package. Start by drinking small amounts, about  cup (120  mL) every 5-10 minutes. Slowly increase how much you drink until you have taken the amount recommended by your health care provider. Drink enough clear fluids to keep your urine pale yellow. If you were told to drink an ORS, finish it first, then start slowly drinking other clear fluids. Drink fluids such as: Water. This includes sparkling water and flavored water. Drinking only water can lead to having too little sodium in your body (hyponatremia). Follow the advice of your health care provider. Water from ice chips you suck on. Fruit juice with water you add to it (diluted). Sports drinks. Hot or cold herbal teas. Broth-based soups. Milk or milk products. Food Follow instructions from your health care provider about what to eat while you rehydrate. Your health care provider may recommend that you slowly begin eating regular foods in small amounts. Eat foods that contain a healthy balance of electrolytes, such as bananas, oranges, potatoes, tomatoes, and spinach. Avoid foods that are greasy or contain a lot of sugar. In some cases, you may get nutrition through a feeding tube that is passed through your nose and into your stomach (nasogastric tube, or NG tube). This may be done if you have uncontrolled vomiting or diarrhea. Beverages to avoid  Certain beverages may make dehydration worse. While you rehydrate, avoid drinking alcohol. How to tell if you are recovering from dehydration You may be recovering from dehydration if: You are urinating more often than before you started rehydrating. Your urine is pale yellow. Your energy level improves. You vomit less frequently. You have diarrhea less frequently. Your appetite improves or returns to normal. You feel less dizzy or less light-headed.   Your skin tone and color start to look more normal. Follow these instructions at home: Take over-the-counter and prescription medicines only as told by your health care provider. Do not take sodium  tablets. Doing this can lead to having too much sodium in your body (hypernatremia). Contact a health care provider if: You continue to have symptoms of mild or moderate dehydration, such as: Thirst. Dry lips. Slightly dry mouth. Dizziness. Dark urine or less urine than normal. Muscle cramps. You continue to vomit or have diarrhea. Get help right away if you: Have symptoms of dehydration that get worse. Have a fever. Have a severe headache. Have been vomiting and the following happens: Your vomiting gets worse or does not go away. Your vomit includes blood or green matter (bile). You cannot eat or drink without vomiting. Have problems with urination or bowel movements, such as: Diarrhea that gets worse or does not go away. Blood in your stool (feces). This may cause stool to look black and tarry. Not urinating, or urinating only a small amount of very dark urine, within 6-8 hours. Have trouble breathing. Have symptoms that get worse with treatment. These symptoms may represent a serious problem that is an emergency. Do not wait to see if the symptoms will go away. Get medical help right away. Call your local emergency services (911 in the [place] Do not drive yourself to the hospital. Summary Rehydration is the replacement of body fluids and minerals (electrolytes) that are lost during dehydration. Follow instructions from your health care provider for rehydration. The kind of fluid and amount you should drink depend on your condition. Slowly increase how much you drink until you have taken the amount recommended by your health care provider. Contact your health care provider if you continue to show signs of mild or moderate dehydration. This information is not intended to replace advice given to you by your health care provider. Make sure you discuss any questions you have with your health care provider. Document Revised: 01/04/2020 Document Reviewed: 11/14/2019 Elsevier Patient  Education  2023 [employer]  

## 2022-09-12 ENCOUNTER — Other Ambulatory Visit: Payer: Self-pay | Admitting: *Deleted

## 2022-09-12 ENCOUNTER — Inpatient Hospital Stay: Payer: BC Managed Care – PPO

## 2022-09-12 VITALS — BP 123/78 | HR 61 | Temp 97.5°F | Resp 18

## 2022-09-12 DIAGNOSIS — E860 Dehydration: Secondary | ICD-10-CM | POA: Diagnosis not present

## 2022-09-12 DIAGNOSIS — Z923 Personal history of irradiation: Secondary | ICD-10-CM | POA: Diagnosis not present

## 2022-09-12 DIAGNOSIS — M109 Gout, unspecified: Secondary | ICD-10-CM | POA: Diagnosis not present

## 2022-09-12 DIAGNOSIS — I10 Essential (primary) hypertension: Secondary | ICD-10-CM | POA: Diagnosis not present

## 2022-09-12 DIAGNOSIS — Z95828 Presence of other vascular implants and grafts: Secondary | ICD-10-CM

## 2022-09-12 DIAGNOSIS — C20 Malignant neoplasm of rectum: Secondary | ICD-10-CM | POA: Diagnosis not present

## 2022-09-12 DIAGNOSIS — L409 Psoriasis, unspecified: Secondary | ICD-10-CM | POA: Diagnosis not present

## 2022-09-12 MED ORDER — HEPARIN SOD (PORK) LOCK FLUSH 100 UNIT/ML IV SOLN
500.0000 [IU] | Freq: Once | INTRAVENOUS | Status: AC
  Administered 2022-09-12: 500 [IU] via INTRAVENOUS

## 2022-09-12 MED ORDER — SODIUM CHLORIDE 0.9% FLUSH
10.0000 mL | Freq: Once | INTRAVENOUS | Status: AC
  Administered 2022-09-12: 10 mL via INTRAVENOUS

## 2022-09-12 MED ORDER — SODIUM CHLORIDE 0.9 % IV SOLN: INTRAVENOUS | Status: AC

## 2022-09-12 NOTE — Patient Instructions (Signed)

## 2022-09-15 ENCOUNTER — Ambulatory Visit: Payer: BC Managed Care – PPO | Admitting: Physical Therapy

## 2022-09-16 ENCOUNTER — Inpatient Hospital Stay: Payer: BC Managed Care – PPO

## 2022-09-16 VITALS — BP 124/76 | HR 62 | Temp 98.3°F | Resp 18 | Ht 71.0 in | Wt 206.5 lb

## 2022-09-16 DIAGNOSIS — C20 Malignant neoplasm of rectum: Secondary | ICD-10-CM

## 2022-09-16 DIAGNOSIS — I10 Essential (primary) hypertension: Secondary | ICD-10-CM | POA: Diagnosis not present

## 2022-09-16 DIAGNOSIS — E860 Dehydration: Secondary | ICD-10-CM | POA: Diagnosis not present

## 2022-09-16 DIAGNOSIS — Z923 Personal history of irradiation: Secondary | ICD-10-CM | POA: Diagnosis not present

## 2022-09-16 DIAGNOSIS — L409 Psoriasis, unspecified: Secondary | ICD-10-CM | POA: Diagnosis not present

## 2022-09-16 DIAGNOSIS — M109 Gout, unspecified: Secondary | ICD-10-CM | POA: Diagnosis not present

## 2022-09-16 DIAGNOSIS — Z95828 Presence of other vascular implants and grafts: Secondary | ICD-10-CM

## 2022-09-16 LAB — CBC WITH DIFFERENTIAL (CANCER CENTER ONLY)
Abs Immature Granulocytes: 0 10*3/uL (ref 0.00–0.07)
Basophils Absolute: 0 10*3/uL (ref 0.0–0.1)
Basophils Relative: 1 %
Eosinophils Absolute: 0.2 10*3/uL (ref 0.0–0.5)
Eosinophils Relative: 4 %
HCT: 36.1 % — ABNORMAL LOW (ref 39.0–52.0)
Hemoglobin: 12.1 g/dL — ABNORMAL LOW (ref 13.0–17.0)
Immature Granulocytes: 0 %
Lymphocytes Relative: 17 %
Lymphs Abs: 0.6 10*3/uL — ABNORMAL LOW (ref 0.7–4.0)
MCH: 32.8 pg (ref 26.0–34.0)
MCHC: 33.5 g/dL (ref 30.0–36.0)
MCV: 97.8 fL (ref 80.0–100.0)
Monocytes Absolute: 0.3 10*3/uL (ref 0.1–1.0)
Monocytes Relative: 9 %
Neutro Abs: 2.5 10*3/uL (ref 1.7–7.7)
Neutrophils Relative %: 69 %
Platelet Count: 109 10*3/uL — ABNORMAL LOW (ref 150–400)
RBC: 3.69 MIL/uL — ABNORMAL LOW (ref 4.22–5.81)
RDW: 12.4 % (ref 11.5–15.5)
WBC Count: 3.7 10*3/uL — ABNORMAL LOW (ref 4.0–10.5)
nRBC: 0 % (ref 0.0–0.2)

## 2022-09-16 LAB — CMP (CANCER CENTER ONLY)
ALT: 23 U/L (ref 0–44)
AST: 30 U/L (ref 15–41)
Albumin: 4.1 g/dL (ref 3.5–5.0)
Alkaline Phosphatase: 174 U/L — ABNORMAL HIGH (ref 38–126)
Anion gap: 9 (ref 5–15)
BUN: 16 mg/dL (ref 8–23)
CO2: 27 mmol/L (ref 22–32)
Calcium: 9.7 mg/dL (ref 8.9–10.3)
Chloride: 103 mmol/L (ref 98–111)
Creatinine: 1.28 mg/dL — ABNORMAL HIGH (ref 0.61–1.24)
GFR, Estimated: >60 mL/min (ref >60)
Glucose, Bld: 111 mg/dL — ABNORMAL HIGH (ref 70–99)
Potassium: 3.9 mmol/L (ref 3.5–5.1)
Sodium: 139 mmol/L (ref 135–145)
Total Bilirubin: 0.6 mg/dL (ref 0.3–1.2)
Total Protein: 6.7 g/dL (ref 6.5–8.1)

## 2022-09-16 LAB — MAGNESIUM: Magnesium: 1.8 mg/dL (ref 1.7–2.4)

## 2022-09-16 MED ORDER — SODIUM CHLORIDE 0.9% FLUSH
10.0000 mL | Freq: Once | INTRAVENOUS | Status: AC
  Administered 2022-09-16: 10 mL via INTRAVENOUS

## 2022-09-16 MED ORDER — SODIUM CHLORIDE 0.9 % IV SOLN: INTRAVENOUS | Status: AC

## 2022-09-16 MED ORDER — HEPARIN SOD (PORK) LOCK FLUSH 100 UNIT/ML IV SOLN
500.0000 [IU] | Freq: Once | INTRAVENOUS | Status: AC
  Administered 2022-09-16: 500 [IU] via INTRAVENOUS

## 2022-09-16 NOTE — Patient Instructions (Signed)

## 2022-09-19 ENCOUNTER — Inpatient Hospital Stay: Payer: BC Managed Care – PPO

## 2022-09-19 ENCOUNTER — Other Ambulatory Visit: Payer: Self-pay | Admitting: *Deleted

## 2022-09-19 DIAGNOSIS — C20 Malignant neoplasm of rectum: Secondary | ICD-10-CM

## 2022-09-22 ENCOUNTER — Other Ambulatory Visit (HOSPITAL_BASED_OUTPATIENT_CLINIC_OR_DEPARTMENT_OTHER): Payer: Self-pay

## 2022-09-22 ENCOUNTER — Inpatient Hospital Stay: Payer: BC Managed Care – PPO | Attending: Oncology

## 2022-09-22 VITALS — BP 116/77 | HR 65 | Temp 98.4°F | Resp 18 | Ht 71.0 in | Wt 208.0 lb

## 2022-09-22 DIAGNOSIS — E860 Dehydration: Secondary | ICD-10-CM | POA: Insufficient documentation

## 2022-09-22 DIAGNOSIS — C20 Malignant neoplasm of rectum: Secondary | ICD-10-CM | POA: Insufficient documentation

## 2022-09-22 DIAGNOSIS — I10 Essential (primary) hypertension: Secondary | ICD-10-CM | POA: Insufficient documentation

## 2022-09-22 DIAGNOSIS — Z95828 Presence of other vascular implants and grafts: Secondary | ICD-10-CM

## 2022-09-22 DIAGNOSIS — M109 Gout, unspecified: Secondary | ICD-10-CM | POA: Insufficient documentation

## 2022-09-22 DIAGNOSIS — L409 Psoriasis, unspecified: Secondary | ICD-10-CM | POA: Insufficient documentation

## 2022-09-22 DIAGNOSIS — Z79899 Other long term (current) drug therapy: Secondary | ICD-10-CM | POA: Insufficient documentation

## 2022-09-22 DIAGNOSIS — Z923 Personal history of irradiation: Secondary | ICD-10-CM | POA: Insufficient documentation

## 2022-09-22 MED ORDER — SODIUM CHLORIDE 0.9% FLUSH
10.0000 mL | Freq: Once | INTRAVENOUS | Status: AC
  Administered 2022-09-22: 10 mL via INTRAVENOUS

## 2022-09-22 MED ORDER — HEPARIN SOD (PORK) LOCK FLUSH 100 UNIT/ML IV SOLN
500.0000 [IU] | Freq: Once | INTRAVENOUS | Status: AC
  Administered 2022-09-22: 500 [IU] via INTRAVENOUS

## 2022-09-22 MED ORDER — SODIUM CHLORIDE 0.9 % IV SOLN: INTRAVENOUS | Status: AC

## 2022-09-22 NOTE — Patient Instructions (Signed)

## 2022-09-23 DIAGNOSIS — D509 Iron deficiency anemia, unspecified: Secondary | ICD-10-CM | POA: Diagnosis not present

## 2022-09-23 DIAGNOSIS — K219 Gastro-esophageal reflux disease without esophagitis: Secondary | ICD-10-CM | POA: Diagnosis not present

## 2022-09-23 DIAGNOSIS — N529 Male erectile dysfunction, unspecified: Secondary | ICD-10-CM | POA: Diagnosis not present

## 2022-09-23 DIAGNOSIS — K6289 Other specified diseases of anus and rectum: Secondary | ICD-10-CM | POA: Diagnosis not present

## 2022-09-23 DIAGNOSIS — F17290 Nicotine dependence, other tobacco product, uncomplicated: Secondary | ICD-10-CM | POA: Diagnosis not present

## 2022-09-23 DIAGNOSIS — N183 Chronic kidney disease, stage 3 unspecified: Secondary | ICD-10-CM | POA: Diagnosis not present

## 2022-09-23 DIAGNOSIS — Z1212 Encounter for screening for malignant neoplasm of rectum: Secondary | ICD-10-CM | POA: Diagnosis not present

## 2022-09-23 DIAGNOSIS — M109 Gout, unspecified: Secondary | ICD-10-CM | POA: Diagnosis not present

## 2022-09-23 DIAGNOSIS — D128 Benign neoplasm of rectum: Secondary | ICD-10-CM | POA: Diagnosis not present

## 2022-09-23 DIAGNOSIS — Z85048 Personal history of other malignant neoplasm of rectum, rectosigmoid junction, and anus: Secondary | ICD-10-CM | POA: Diagnosis not present

## 2022-09-23 DIAGNOSIS — Z923 Personal history of irradiation: Secondary | ICD-10-CM | POA: Diagnosis not present

## 2022-09-23 DIAGNOSIS — I129 Hypertensive chronic kidney disease with stage 1 through stage 4 chronic kidney disease, or unspecified chronic kidney disease: Secondary | ICD-10-CM | POA: Diagnosis not present

## 2022-09-23 DIAGNOSIS — C20 Malignant neoplasm of rectum: Secondary | ICD-10-CM | POA: Diagnosis not present

## 2022-09-25 DIAGNOSIS — I129 Hypertensive chronic kidney disease with stage 1 through stage 4 chronic kidney disease, or unspecified chronic kidney disease: Secondary | ICD-10-CM | POA: Diagnosis not present

## 2022-09-25 DIAGNOSIS — C20 Malignant neoplasm of rectum: Secondary | ICD-10-CM | POA: Diagnosis not present

## 2022-09-25 DIAGNOSIS — N1831 Chronic kidney disease, stage 3a: Secondary | ICD-10-CM | POA: Diagnosis not present

## 2022-09-25 DIAGNOSIS — Z932 Ileostomy status: Secondary | ICD-10-CM | POA: Diagnosis not present

## 2022-09-25 DIAGNOSIS — Z01812 Encounter for preprocedural laboratory examination: Secondary | ICD-10-CM | POA: Diagnosis not present

## 2022-09-25 DIAGNOSIS — G620 Drug-induced polyneuropathy: Secondary | ICD-10-CM | POA: Diagnosis present

## 2022-09-26 ENCOUNTER — Inpatient Hospital Stay: Payer: BC Managed Care – PPO

## 2022-09-27 DIAGNOSIS — Z932 Ileostomy status: Secondary | ICD-10-CM | POA: Diagnosis not present

## 2022-09-30 ENCOUNTER — Other Ambulatory Visit: Payer: Self-pay | Admitting: Nurse Practitioner

## 2022-09-30 ENCOUNTER — Other Ambulatory Visit (HOSPITAL_BASED_OUTPATIENT_CLINIC_OR_DEPARTMENT_OTHER): Payer: Self-pay

## 2022-09-30 ENCOUNTER — Inpatient Hospital Stay: Payer: BC Managed Care – PPO

## 2022-09-30 VITALS — BP 119/87 | HR 66 | Temp 97.5°F | Resp 20 | Ht 71.0 in | Wt 209.4 lb

## 2022-09-30 DIAGNOSIS — C20 Malignant neoplasm of rectum: Secondary | ICD-10-CM

## 2022-09-30 DIAGNOSIS — E860 Dehydration: Secondary | ICD-10-CM | POA: Diagnosis not present

## 2022-09-30 DIAGNOSIS — L409 Psoriasis, unspecified: Secondary | ICD-10-CM | POA: Diagnosis not present

## 2022-09-30 DIAGNOSIS — Z923 Personal history of irradiation: Secondary | ICD-10-CM | POA: Diagnosis not present

## 2022-09-30 DIAGNOSIS — M109 Gout, unspecified: Secondary | ICD-10-CM | POA: Diagnosis not present

## 2022-09-30 DIAGNOSIS — Z95828 Presence of other vascular implants and grafts: Secondary | ICD-10-CM

## 2022-09-30 DIAGNOSIS — E876 Hypokalemia: Secondary | ICD-10-CM

## 2022-09-30 DIAGNOSIS — I10 Essential (primary) hypertension: Secondary | ICD-10-CM | POA: Diagnosis not present

## 2022-09-30 DIAGNOSIS — Z79899 Other long term (current) drug therapy: Secondary | ICD-10-CM | POA: Diagnosis not present

## 2022-09-30 MED ORDER — HEPARIN SOD (PORK) LOCK FLUSH 100 UNIT/ML IV SOLN: 500.0000 [IU] | Freq: Once | INTRAVENOUS | Status: AC

## 2022-09-30 MED ORDER — SODIUM CHLORIDE 0.9 % IV SOLN: INTRAVENOUS | Status: AC

## 2022-09-30 MED ORDER — POTASSIUM CHLORIDE CRYS ER 20 MEQ PO TBCR
20.0000 meq | EXTENDED_RELEASE_TABLET | Freq: Every day | ORAL | 3 refills | Status: DC
  Filled 2022-09-30: qty 30, 30d supply, fill #0
  Filled 2022-10-28: qty 30, 30d supply, fill #1
  Filled 2022-12-04 – 2022-12-05 (×2): qty 30, 30d supply, fill #2
  Filled 2022-12-28: qty 30, 30d supply, fill #3

## 2022-09-30 MED ORDER — HEPARIN SOD (PORK) LOCK FLUSH 100 UNIT/ML IV SOLN
500.0000 [IU] | Freq: Once | INTRAVENOUS | Status: AC
  Administered 2022-09-30: 500 [IU] via INTRAVENOUS

## 2022-09-30 MED ORDER — SODIUM CHLORIDE 0.9% FLUSH
10.0000 mL | Freq: Once | INTRAVENOUS | Status: AC
  Administered 2022-09-30: 10 mL via INTRAVENOUS

## 2022-09-30 NOTE — Patient Instructions (Signed)

## 2022-10-06 DIAGNOSIS — N1831 Chronic kidney disease, stage 3a: Secondary | ICD-10-CM | POA: Diagnosis not present

## 2022-10-06 DIAGNOSIS — C786 Secondary malignant neoplasm of retroperitoneum and peritoneum: Secondary | ICD-10-CM | POA: Diagnosis not present

## 2022-10-06 DIAGNOSIS — Z923 Personal history of irradiation: Secondary | ICD-10-CM | POA: Diagnosis not present

## 2022-10-06 DIAGNOSIS — Z932 Ileostomy status: Secondary | ICD-10-CM | POA: Diagnosis not present

## 2022-10-06 DIAGNOSIS — F17290 Nicotine dependence, other tobacco product, uncomplicated: Secondary | ICD-10-CM | POA: Diagnosis not present

## 2022-10-06 DIAGNOSIS — K219 Gastro-esophageal reflux disease without esophagitis: Secondary | ICD-10-CM | POA: Diagnosis not present

## 2022-10-06 DIAGNOSIS — I129 Hypertensive chronic kidney disease with stage 1 through stage 4 chronic kidney disease, or unspecified chronic kidney disease: Secondary | ICD-10-CM | POA: Diagnosis not present

## 2022-10-06 DIAGNOSIS — C20 Malignant neoplasm of rectum: Secondary | ICD-10-CM | POA: Diagnosis not present

## 2022-10-06 DIAGNOSIS — J309 Allergic rhinitis, unspecified: Secondary | ICD-10-CM | POA: Diagnosis not present

## 2022-10-06 DIAGNOSIS — G8918 Other acute postprocedural pain: Secondary | ICD-10-CM | POA: Diagnosis not present

## 2022-10-06 DIAGNOSIS — Z9221 Personal history of antineoplastic chemotherapy: Secondary | ICD-10-CM | POA: Diagnosis not present

## 2022-10-08 ENCOUNTER — Telehealth: Payer: Self-pay

## 2022-10-08 NOTE — Telephone Encounter (Addendum)
TC from Kenneth [name] stating Mr. Bangura was not having his colon resection surgery because Dr. Sherwood Gambler found additional spread. They would like to spoke with Dr [name] to regroup. They would like a call from the provider or come in to discuss what's next.

## 2022-10-14 ENCOUNTER — Inpatient Hospital Stay: Payer: BC Managed Care – PPO | Admitting: Oncology

## 2022-10-14 VITALS — BP 126/87 | HR 73 | Temp 98.1°F | Resp 20 | Ht 71.0 in | Wt 206.4 lb

## 2022-10-14 DIAGNOSIS — L409 Psoriasis, unspecified: Secondary | ICD-10-CM | POA: Diagnosis not present

## 2022-10-14 DIAGNOSIS — M109 Gout, unspecified: Secondary | ICD-10-CM | POA: Diagnosis not present

## 2022-10-14 DIAGNOSIS — C20 Malignant neoplasm of rectum: Secondary | ICD-10-CM | POA: Diagnosis not present

## 2022-10-14 DIAGNOSIS — I10 Essential (primary) hypertension: Secondary | ICD-10-CM | POA: Diagnosis not present

## 2022-10-14 DIAGNOSIS — Z79899 Other long term (current) drug therapy: Secondary | ICD-10-CM | POA: Diagnosis not present

## 2022-10-14 DIAGNOSIS — E860 Dehydration: Secondary | ICD-10-CM | POA: Diagnosis not present

## 2022-10-14 DIAGNOSIS — Z923 Personal history of irradiation: Secondary | ICD-10-CM | POA: Diagnosis not present

## 2022-10-14 NOTE — Progress Notes (Signed)
[place] OFFICE PROGRESS NOTE   Diagnosis: Rectal cancer  INTERVAL HISTORY:   Mr. Babler was taken to the operating room by Dr. Morton Stall on 10/06/2022 for a planned low anterior resection.  He was found to have carcinomatosis adjacent to the sigmoid and in the right lower quadrant.  Less than 5 mm implants were seen in the right upper quadrant and left lower quadrant.  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  The planned procedure was aborted.  He reports mild soreness at the midline incision.  A dressing remains in place.  He is scheduled for follow-up with Dr. Morton Stall in 2 weeks.  He is also scheduled for consultation to consider HIPEC.  He empties the ileostomy 5-6 times per day.  He does not feel dehydrated at present.  He takes Imodium and Lomotil, 6-8 each daily.  Objective:  Vital signs in last 24 hours:  Blood pressure 126/87, pulse 73, temperature 98.1 F (36.7 C), temperature source Oral, resp. rate 20, height 5' 11" (1.803 m), weight 206 lb 6.4 oz (93.6 kg), SpO2 98 %.    Lymphatics: No cervical, supraclavicular, axillary, or inguinal nodes Resp: Clear bilaterally Cardio: Regular rate and rhythm GI: No hepatomegaly, right abdomen ileostomy, midline incision with a dry dressing Vascular: No leg edema  Portacath/PICC-without erythema  Lab Results:  Lab Results  Component Value Date   WBC 3.7 (L) 09/16/2022   HGB 12.1 (L) 09/16/2022   HCT 36.1 (L) 09/16/2022   MCV 97.8 09/16/2022   PLT 109 (L) 09/16/2022   NEUTROABS 2.5 09/16/2022    CMP  Lab Results  Component Value Date   NA 139 09/16/2022   K 3.9 09/16/2022   CL 103 09/16/2022   CO2 27 09/16/2022   GLUCOSE 111 (H) 09/16/2022   BUN 16 09/16/2022   CREATININE 1.28 (H) 09/16/2022   CALCIUM 9.7 09/16/2022   PROT 6.7 09/16/2022   ALBUMIN 4.1 09/16/2022   AST 30 09/16/2022   ALT 23 09/16/2022   ALKPHOS 174 (H) 09/16/2022   BILITOT 0.6 09/16/2022    GFRNONAA >60 09/16/2022   GFRAA 84 03/17/2008    Lab Results  Component Value Date   CEA 5.43 (H) 02/10/2022    No results found for: "INR", "LABPROT"  Imaging:  No results found.  Medications: I have reviewed the patient's current medications.   Assessment/Plan: Rectal cancer-mass at 11 cm on proctoscopy by Dr. Morton Stall 02/04/2022 Colonoscopy 12/26/2021-obstructing mass at the rectosigmoid measured at 15 cm, biopsy invasive moderate to poorly differentiated adenocarcinoma, mismatch repair protein expression intact CTs 12/31/2021-masslike circumferential thickening of the distal sigmoid colon with mildly enlarged pericolonic lymph nodes, numerous subcentimeter hypodense liver lesions, splenomegaly MRI abdomen 01/07/2022-innumerable T2 hyperintense liver lesions consistent with cysts, spleen unremarkable, no ascites or adenopathy, moderate colonic fecal retention, no bowel obstruction MRI pelvis 01/22/2022-T3cN2 tumor above and below the peritoneal reflection, tumor 11.3 cm from the anal verge and 6.1 cm from the sphincter complex, multiple enlarged perirectal nodes, 1.1 cm node versus tumor deposit at the right aspect of the tumor Diverting loop ileostomy 01/15/2022 Cycle 1 FOLFOX 02/17/2022 Cycle 2 FOLFOX 03/04/2022 Cycle 3 FOLFOX held on 03/17/2022 due to neutropenia Cycle 3 FOLFOX 03/17/2022, Udenyca Treatment held 03/31/2022 due to dehydration Cycle 4 FOLFOX 04/07/2022 Cycle 5 FOLFOX 04/22/2022, oxaliplatin dose reduced due to thrombocytopenia Cycle 6 FOLFOX 05/05/2022 Cycle 7 FOLFOX 05/19/2022 Cycle 8 FOLFOX held secondary to patient preference and toxicity (diarrhea/weight loss/neuropathy symptoms) 06/16/2022 radiation/Xeloda-07/24/2022 MRI 08/27/2022-no  suspicious lymph nodes; significant reduction in size with near complete resolution of previously identified tumor deposit versus node along the right aspect of the tumor; overall reduction in bulk and signal of mucinous high rectal malignancy,  mrTRG-3 10/06/2022-exploratory laparotomy and laparoscopy-carcinomatosis with low-volume peritoneal disease, dominant area adjacent to sigmoid and right lower quadrant, less than 5 mm implants in the right upper quadrant and left lower quadrant.  PCI 5 Gout Psoriasis Hypertension Left scalene node versus cyst/lipoma on exam 02/03/2022-CT neck 02/10/2022 with 9.5 mm lymph node in the left supraclavicular region posterior to the sternocleidomastoid muscle.  No other prominent lymph nodes in the neck.  Biopsy 02/12/2022 compatible with benign lymph node, negative for metastatic carcinoma.    Disposition: Mr. Buchan has rectal cancer.  He has been diagnosed with stage IV disease after undergoing an exploratory laparotomy last week.  We discussed the prognosis and treatment options.  He is scheduled for surgical oncology consultation at [facility] to consider HIPEC therapy.  We discussed systemic treatment options.  He appears asymptomatic at present and appears to have low-volume tumor burden.  We will obtain a staging PET scan to look for evidence of measurable disease.  We will check a CEA when he returns for IV fluids on 10/24/2022.  We will request the peritoneal nodule biopsy tissue be submitted for [facility] 1 testing.  Mr. Hoback will return for an office visit and further discussion on 11/07/2022.  Betsy Coder, MD  10/14/2022  4:01 PM

## 2022-10-15 ENCOUNTER — Other Ambulatory Visit: Payer: Self-pay | Admitting: *Deleted

## 2022-10-15 ENCOUNTER — Encounter: Payer: Self-pay | Admitting: *Deleted

## 2022-10-15 DIAGNOSIS — C20 Malignant neoplasm of rectum: Secondary | ICD-10-CM

## 2022-10-15 NOTE — Progress Notes (Signed)
Faxed letter that MD dictated to assist with his SS Disability claim. Faxed to "Janett Billow" at [phone]. Notified Mrs. Champine via VM that it was sent. Original will be given to MR. Genrich at 12/01 visit.

## 2022-10-17 ENCOUNTER — Encounter: Payer: Self-pay | Admitting: *Deleted

## 2022-10-17 ENCOUNTER — Inpatient Hospital Stay: Payer: BC Managed Care – PPO | Attending: Oncology

## 2022-10-17 VITALS — BP 124/83 | HR 67 | Temp 97.6°F | Resp 18 | Ht 71.0 in | Wt 204.6 lb

## 2022-10-17 DIAGNOSIS — C20 Malignant neoplasm of rectum: Secondary | ICD-10-CM | POA: Diagnosis not present

## 2022-10-17 DIAGNOSIS — E860 Dehydration: Secondary | ICD-10-CM | POA: Insufficient documentation

## 2022-10-17 DIAGNOSIS — Z95828 Presence of other vascular implants and grafts: Secondary | ICD-10-CM

## 2022-10-17 MED ORDER — HEPARIN SOD (PORK) LOCK FLUSH 100 UNIT/ML IV SOLN
500.0000 [IU] | Freq: Once | INTRAVENOUS | Status: AC
  Administered 2022-10-17: 500 [IU] via INTRAVENOUS

## 2022-10-17 MED ORDER — SODIUM CHLORIDE 0.9% FLUSH
10.0000 mL | Freq: Once | INTRAVENOUS | Status: AC
  Administered 2022-10-17: 10 mL via INTRAVENOUS

## 2022-10-17 MED ORDER — SODIUM CHLORIDE 0.9 % IV SOLN: INTRAVENOUS | Status: AC

## 2022-10-17 NOTE — Progress Notes (Signed)
Requested [facility] testing on [phone] (peritoneal tissue) sample dated 10/06/22 via website per Dr. [name] request. Dx: C20 Stage IV

## 2022-10-17 NOTE — Patient Instructions (Signed)

## 2022-10-20 ENCOUNTER — Encounter: Payer: Self-pay | Admitting: *Deleted

## 2022-10-20 NOTE — Progress Notes (Signed)
Received fax from [facility] that tissue has been received as of 10/17/2022

## 2022-10-21 ENCOUNTER — Other Ambulatory Visit: Payer: BC Managed Care – PPO

## 2022-10-21 ENCOUNTER — Ambulatory Visit: Payer: BC Managed Care – PPO

## 2022-10-21 ENCOUNTER — Ambulatory Visit: Payer: BC Managed Care – PPO | Admitting: Oncology

## 2022-10-22 ENCOUNTER — Other Ambulatory Visit: Payer: Self-pay

## 2022-10-22 DIAGNOSIS — C20 Malignant neoplasm of rectum: Secondary | ICD-10-CM

## 2022-10-24 ENCOUNTER — Other Ambulatory Visit: Payer: Self-pay | Admitting: *Deleted

## 2022-10-24 ENCOUNTER — Inpatient Hospital Stay: Payer: BC Managed Care – PPO

## 2022-10-24 VITALS — BP 108/76 | HR 66 | Temp 98.4°F | Resp 18 | Ht 71.0 in | Wt 203.0 lb

## 2022-10-24 DIAGNOSIS — Z95828 Presence of other vascular implants and grafts: Secondary | ICD-10-CM

## 2022-10-24 DIAGNOSIS — E860 Dehydration: Secondary | ICD-10-CM | POA: Diagnosis not present

## 2022-10-24 DIAGNOSIS — C20 Malignant neoplasm of rectum: Secondary | ICD-10-CM

## 2022-10-24 LAB — CBC WITH DIFFERENTIAL (CANCER CENTER ONLY)
Abs Immature Granulocytes: 0.01 10*3/uL (ref 0.00–0.07)
Basophils Absolute: 0 10*3/uL (ref 0.0–0.1)
Basophils Relative: 1 %
Eosinophils Absolute: 0.2 10*3/uL (ref 0.0–0.5)
Eosinophils Relative: 3 %
HCT: 38.6 % — ABNORMAL LOW (ref 39.0–52.0)
Hemoglobin: 13.3 g/dL (ref 13.0–17.0)
Immature Granulocytes: 0 %
Lymphocytes Relative: 12 %
Lymphs Abs: 0.8 10*3/uL (ref 0.7–4.0)
MCH: 31.4 pg (ref 26.0–34.0)
MCHC: 34.5 g/dL (ref 30.0–36.0)
MCV: 91 fL (ref 80.0–100.0)
Monocytes Absolute: 0.4 10*3/uL (ref 0.1–1.0)
Monocytes Relative: 6 %
Neutro Abs: 5.6 10*3/uL (ref 1.7–7.7)
Neutrophils Relative %: 78 %
Platelet Count: 136 10*3/uL — ABNORMAL LOW (ref 150–400)
RBC: 4.24 MIL/uL (ref 4.22–5.81)
RDW: 12.2 % (ref 11.5–15.5)
WBC Count: 7.1 10*3/uL (ref 4.0–10.5)
nRBC: 0 % (ref 0.0–0.2)

## 2022-10-24 LAB — CEA (ACCESS): CEA (CHCC): 2.58 ng/mL (ref 0.00–5.00)

## 2022-10-24 LAB — CMP (CANCER CENTER ONLY)
ALT: 26 U/L (ref 0–44)
AST: 29 U/L (ref 15–41)
Albumin: 4.4 g/dL (ref 3.5–5.0)
Alkaline Phosphatase: 213 U/L — ABNORMAL HIGH (ref 38–126)
Anion gap: 11 (ref 5–15)
BUN: 19 mg/dL (ref 8–23)
CO2: 26 mmol/L (ref 22–32)
Calcium: 9.6 mg/dL (ref 8.9–10.3)
Chloride: 103 mmol/L (ref 98–111)
Creatinine: 1.33 mg/dL — ABNORMAL HIGH (ref 0.61–1.24)
GFR, Estimated: >60 mL/min (ref >60)
Glucose, Bld: 134 mg/dL — ABNORMAL HIGH (ref 70–99)
Potassium: 3.8 mmol/L (ref 3.5–5.1)
Sodium: 140 mmol/L (ref 135–145)
Total Bilirubin: 0.5 mg/dL (ref 0.3–1.2)
Total Protein: 7.1 g/dL (ref 6.5–8.1)

## 2022-10-24 LAB — MAGNESIUM: Magnesium: 1.8 mg/dL (ref 1.7–2.4)

## 2022-10-24 MED ORDER — HEPARIN SOD (PORK) LOCK FLUSH 100 UNIT/ML IV SOLN
500.0000 [IU] | Freq: Once | INTRAVENOUS | Status: AC
  Administered 2022-10-24: 500 [IU] via INTRAVENOUS

## 2022-10-24 MED ORDER — SODIUM CHLORIDE 0.9% FLUSH
10.0000 mL | Freq: Once | INTRAVENOUS | Status: AC
  Administered 2022-10-24: 10 mL via INTRAVENOUS

## 2022-10-24 MED ORDER — SODIUM CHLORIDE 0.9 % IV SOLN: Freq: Once | INTRAVENOUS | Status: AC

## 2022-10-24 NOTE — Patient Instructions (Signed)

## 2022-10-28 ENCOUNTER — Other Ambulatory Visit (HOSPITAL_BASED_OUTPATIENT_CLINIC_OR_DEPARTMENT_OTHER): Payer: Self-pay

## 2022-10-28 MED ORDER — DIPHENOXYLATE-ATROPINE 2.5-0.025 MG PO TABS: ORAL_TABLET | ORAL | 2 refills | Status: DC

## 2022-10-28 MED ORDER — DIPHENOXYLATE-ATROPINE 2.5-0.025 MG PO TABS
2.0000 | ORAL_TABLET | Freq: Four times a day (QID) | ORAL | 2 refills | Status: DC | PRN
  Filled 2022-10-28: qty 240, 30d supply, fill #0
  Filled 2022-12-04: qty 240, 30d supply, fill #1
  Filled 2022-12-05: qty 180, 23d supply, fill #1

## 2022-10-29 ENCOUNTER — Telehealth: Payer: Self-pay | Admitting: *Deleted

## 2022-10-29 ENCOUNTER — Other Ambulatory Visit: Payer: Self-pay | Admitting: Oncology

## 2022-10-29 NOTE — Telephone Encounter (Signed)
Letter of medical necessity sent to managed care to forward to [facility]. Will need to cancel PET if unable to get approval tomorrow. Mrs. Skibicki made aware and was told he does not need to have the 10/31/22 appointments. Will keep her posted on PET.

## 2022-10-30 ENCOUNTER — Encounter: Payer: Self-pay | Admitting: *Deleted

## 2022-10-30 ENCOUNTER — Telehealth: Payer: Self-pay | Admitting: *Deleted

## 2022-10-30 DIAGNOSIS — C786 Secondary malignant neoplasm of retroperitoneum and peritoneum: Secondary | ICD-10-CM | POA: Diagnosis present

## 2022-10-30 NOTE — Telephone Encounter (Signed)
Notified by managed care that [employer] has denied the appeal, saying we can't do an appeal at this time. The member has to initiate an appeal with his home plan and assigned the rights over to [place] to do an appeal. Managed care asked if we can start the case over and was told after 180 days. To call for clarification, please call [phone] I spoke w Charlesetta Garibaldi, nurse reviewer. This RN called above number and was told patient must call his [employer] to request an appeal on a denial.

## 2022-10-31 ENCOUNTER — Inpatient Hospital Stay: Payer: BC Managed Care – PPO

## 2022-10-31 ENCOUNTER — Inpatient Hospital Stay: Payer: BC Managed Care – PPO | Admitting: Oncology

## 2022-10-31 ENCOUNTER — Other Ambulatory Visit (HOSPITAL_BASED_OUTPATIENT_CLINIC_OR_DEPARTMENT_OTHER): Payer: Self-pay

## 2022-10-31 ENCOUNTER — Other Ambulatory Visit: Payer: Self-pay | Admitting: Nurse Practitioner

## 2022-10-31 VITALS — BP 122/93 | HR 66 | Temp 98.4°F | Resp 20 | Ht 71.0 in | Wt 203.0 lb

## 2022-10-31 DIAGNOSIS — Z95828 Presence of other vascular implants and grafts: Secondary | ICD-10-CM

## 2022-10-31 DIAGNOSIS — C20 Malignant neoplasm of rectum: Secondary | ICD-10-CM | POA: Diagnosis not present

## 2022-10-31 DIAGNOSIS — E860 Dehydration: Secondary | ICD-10-CM | POA: Diagnosis not present

## 2022-10-31 MED ORDER — SODIUM CHLORIDE 0.9% FLUSH
10.0000 mL | INTRAVENOUS | Status: AC | PRN
  Administered 2022-10-31: 10 mL

## 2022-10-31 MED ORDER — LIDOCAINE-PRILOCAINE 2.5-2.5 % EX CREA
1.0000 | TOPICAL_CREAM | CUTANEOUS | 5 refills | Status: DC | PRN
  Filled 2022-10-31: qty 30, 30d supply, fill #0
  Filled 2022-11-27: qty 30, 30d supply, fill #1
  Filled 2023-01-05: qty 30, 30d supply, fill #2

## 2022-10-31 MED ORDER — SODIUM CHLORIDE 0.9 % IV SOLN: Freq: Once | INTRAVENOUS | Status: AC

## 2022-10-31 MED ORDER — HEPARIN SOD (PORK) LOCK FLUSH 100 UNIT/ML IV SOLN
500.0000 [IU] | INTRAVENOUS | Status: AC | PRN
  Administered 2022-10-31: 500 [IU]

## 2022-10-31 NOTE — Patient Instructions (Signed)

## 2022-11-03 ENCOUNTER — Encounter (HOSPITAL_COMMUNITY): Payer: BC Managed Care – PPO

## 2022-11-05 ENCOUNTER — Other Ambulatory Visit: Payer: Self-pay | Admitting: *Deleted

## 2022-11-05 DIAGNOSIS — C20 Malignant neoplasm of rectum: Secondary | ICD-10-CM

## 2022-11-06 ENCOUNTER — Inpatient Hospital Stay: Payer: BC Managed Care – PPO

## 2022-11-06 ENCOUNTER — Inpatient Hospital Stay: Payer: BC Managed Care – PPO | Admitting: Oncology

## 2022-11-06 VITALS — BP 111/81 | HR 64 | Resp 20

## 2022-11-06 VITALS — BP 129/83 | HR 76 | Temp 98.2°F | Resp 18 | Ht 71.0 in | Wt 202.6 lb

## 2022-11-06 DIAGNOSIS — Z95828 Presence of other vascular implants and grafts: Secondary | ICD-10-CM

## 2022-11-06 DIAGNOSIS — C20 Malignant neoplasm of rectum: Secondary | ICD-10-CM

## 2022-11-06 DIAGNOSIS — E860 Dehydration: Secondary | ICD-10-CM | POA: Diagnosis not present

## 2022-11-06 MED ORDER — HEPARIN SOD (PORK) LOCK FLUSH 100 UNIT/ML IV SOLN
500.0000 [IU] | INTRAVENOUS | Status: AC | PRN
  Administered 2022-11-06: 500 [IU]

## 2022-11-06 MED ORDER — SODIUM CHLORIDE 0.9 % IV SOLN: INTRAVENOUS | Status: AC

## 2022-11-06 MED ORDER — SODIUM CHLORIDE 0.9% FLUSH
10.0000 mL | INTRAVENOUS | Status: AC | PRN
  Administered 2022-11-06: 10 mL

## 2022-11-06 NOTE — Patient Instructions (Signed)

## 2022-11-06 NOTE — Progress Notes (Signed)
[place] OFFICE PROGRESS NOTE   Diagnosis: Rectal cancer  INTERVAL HISTORY:   Kenneth [name] returns as scheduled.  He empties ileostomy 6-8 times per day.  The bag is not full.  He continues Imodium and Lomotil.  He feels better after receiving weekly IV fluids. He decided against evaluation for HIPEC.  Insurance company refused that scan.  [facility] will not release the peritoneal nodule for molecular testing.  Objective:  Vital signs in last 24 hours:  Blood pressure 129/83, pulse 76, temperature 98.2 F (36.8 C), temperature source Oral, resp. rate 18, height _0  (1.803 m), weight 202 lb 9.6 oz (91.9 kg), SpO2 99 %.    HEENT: The mucous membranes are dry, no thrush Resp: Clear bilaterally Cardio: Regular rate and rhythm GI: No hepatosplenomegaly, healed midline incision, right upper quadrant ileostomy Vascular: No leg edema  Skin: Diminished skin turgor  Portacath/PICC-without erythema  Lab Results:  Lab Results  Component Value Date   WBC 7.1 10/24/2022   HGB 13.3 10/24/2022   HCT 38.6 (L) 10/24/2022   MCV 91.0 10/24/2022   PLT 136 (L) 10/24/2022   NEUTROABS 5.6 10/24/2022    CMP  Lab Results  Component Value Date   NA 140 10/24/2022   K 3.8 10/24/2022   CL 103 10/24/2022   CO2 26 10/24/2022   GLUCOSE 134 (H) 10/24/2022   BUN 19 10/24/2022   CREATININE 1.33 (H) 10/24/2022   CALCIUM 9.6 10/24/2022   PROT 7.1 10/24/2022   ALBUMIN 4.4 10/24/2022   AST 29 10/24/2022   ALT 26 10/24/2022   ALKPHOS 213 (H) 10/24/2022   BILITOT 0.5 10/24/2022   GFRNONAA >60 10/24/2022   GFRAA 84 03/17/2008    Lab Results  Component Value Date   CEA 2.58 10/24/2022     Medications: I have reviewed the patient's current medications.   Assessment/Plan: Rectal cancer-mass at 11 cm on proctoscopy by Dr. Morton Stall 02/04/2022 Colonoscopy 12/26/2021-obstructing mass at the rectosigmoid measured at 15 cm, biopsy invasive moderate to poorly differentiated  adenocarcinoma, mismatch repair protein expression intact CTs 12/31/2021-masslike circumferential thickening of the distal sigmoid colon with mildly enlarged pericolonic lymph nodes, numerous subcentimeter hypodense liver lesions, splenomegaly MRI abdomen 01/07/2022-innumerable T2 hyperintense liver lesions consistent with cysts, spleen unremarkable, no ascites or adenopathy, moderate colonic fecal retention, no bowel obstruction MRI pelvis 01/22/2022-T3cN2 tumor above and below the peritoneal reflection, tumor 11.3 cm from the anal verge and 6.1 cm from the sphincter complex, multiple enlarged perirectal nodes, 1.1 cm node versus tumor deposit at the right aspect of the tumor Diverting loop ileostomy 01/15/2022 Cycle 1 FOLFOX 02/17/2022 Cycle 2 FOLFOX 03/04/2022 Cycle 3 FOLFOX held on 03/17/2022 due to neutropenia Cycle 3 FOLFOX 03/17/2022, Udenyca Treatment held 03/31/2022 due to dehydration Cycle 4 FOLFOX 04/07/2022 Cycle 5 FOLFOX 04/22/2022, oxaliplatin dose reduced due to thrombocytopenia Cycle 6 FOLFOX 05/05/2022 Cycle 7 FOLFOX 05/19/2022 Cycle 8 FOLFOX held secondary to patient preference and toxicity (diarrhea/weight loss/neuropathy symptoms) 06/16/2022 radiation/Xeloda-07/24/2022 MRI 08/27/2022-no suspicious lymph nodes; significant reduction in size with near complete resolution of previously identified tumor deposit versus node along the right aspect of the tumor; overall reduction in bulk and signal of mucinous high rectal malignancy, mrTRG-3 10/06/2022-exploratory laparotomy and laparoscopy-carcinomatosis with low-volume peritoneal disease, dominant area adjacent to sigmoid and right lower quadrant, less than 5 mm implants in the right upper quadrant and left lower quadrant.  PCI 5 Gout Psoriasis Hypertension Left scalene node versus cyst/lipoma on exam 02/03/2022-CT neck 02/10/2022 with 9.5 mm lymph  node in the left supraclavicular region posterior to the sternocleidomastoid muscle.  No other prominent  lymph nodes in the neck.  Biopsy 02/12/2022 compatible with benign lymph node, negative for metastatic carcinoma.      Disposition: [name] appears unchanged.  He continues to have high output from the ileostomy.  He will continue weekly IV fluids.  He is taking Imodium and Lomotil.  He has been diagnosed with metastatic rectal cancer.  There is no "measurable "disease on physical exam or CT evaluation.  He will appeal to his insurance company to try and get approval for the PET scan.  If this is unsuccessful I will recommend restaging CTs when he returns next month.  I will contact Dr. Morton Stall to see if he can arrange for molecular testing on the peritoneal nodule.  [name] will return for weekly IV fluids.  He will be scheduled for an office visit on 11/27/2021.  Betsy Coder, MD  11/06/2022  9:38 AM

## 2022-11-07 ENCOUNTER — Inpatient Hospital Stay: Payer: BC Managed Care – PPO

## 2022-11-07 ENCOUNTER — Encounter: Payer: Self-pay | Admitting: *Deleted

## 2022-11-07 ENCOUNTER — Inpatient Hospital Stay: Payer: BC Managed Care – PPO | Admitting: Nurse Practitioner

## 2022-11-07 ENCOUNTER — Other Ambulatory Visit: Payer: Self-pay | Admitting: *Deleted

## 2022-11-07 DIAGNOSIS — C20 Malignant neoplasm of rectum: Secondary | ICD-10-CM

## 2022-11-11 ENCOUNTER — Encounter: Payer: Self-pay | Admitting: *Deleted

## 2022-11-13 ENCOUNTER — Other Ambulatory Visit: Payer: Self-pay

## 2022-11-13 DIAGNOSIS — C20 Malignant neoplasm of rectum: Secondary | ICD-10-CM

## 2022-11-13 DIAGNOSIS — E871 Hypo-osmolality and hyponatremia: Secondary | ICD-10-CM

## 2022-11-14 ENCOUNTER — Inpatient Hospital Stay: Payer: BC Managed Care – PPO

## 2022-11-14 ENCOUNTER — Ambulatory Visit (HOSPITAL_BASED_OUTPATIENT_CLINIC_OR_DEPARTMENT_OTHER)
Admission: RE | Admit: 2022-11-14 | Discharge: 2022-11-14 | Disposition: A | Discharge status: Home / Self Care | Payer: BC Managed Care – PPO | Source: Ambulatory Visit | Attending: Oncology | Admitting: Oncology

## 2022-11-14 VITALS — BP 123/93 | HR 67 | Temp 98.3°F | Resp 18 | Ht 71.0 in | Wt 204.0 lb

## 2022-11-14 DIAGNOSIS — C20 Malignant neoplasm of rectum: Secondary | ICD-10-CM | POA: Diagnosis not present

## 2022-11-14 DIAGNOSIS — K8020 Calculus of gallbladder without cholecystitis without obstruction: Secondary | ICD-10-CM | POA: Diagnosis not present

## 2022-11-14 DIAGNOSIS — Z95828 Presence of other vascular implants and grafts: Secondary | ICD-10-CM

## 2022-11-14 DIAGNOSIS — K6289 Other specified diseases of anus and rectum: Secondary | ICD-10-CM | POA: Diagnosis not present

## 2022-11-14 DIAGNOSIS — N3289 Other specified disorders of bladder: Secondary | ICD-10-CM | POA: Diagnosis not present

## 2022-11-14 DIAGNOSIS — R918 Other nonspecific abnormal finding of lung field: Secondary | ICD-10-CM | POA: Diagnosis not present

## 2022-11-14 LAB — BASIC METABOLIC PANEL - CANCER CENTER ONLY
Anion gap: 8 (ref 5–15)
BUN: 17 mg/dL (ref 8–23)
CO2: 29 mmol/L (ref 22–32)
Calcium: 10.1 mg/dL (ref 8.9–10.3)
Chloride: 101 mmol/L (ref 98–111)
Creatinine: 1.35 mg/dL — ABNORMAL HIGH (ref 0.61–1.24)
GFR, Estimated: 60 mL/min — ABNORMAL LOW (ref >60)
Glucose, Bld: 135 mg/dL — ABNORMAL HIGH (ref 70–99)
Potassium: 4.2 mmol/L (ref 3.5–5.1)
Sodium: 138 mmol/L (ref 135–145)

## 2022-11-14 LAB — MAGNESIUM: Magnesium: 1.8 mg/dL (ref 1.7–2.4)

## 2022-11-14 MED ORDER — IOHEXOL 300 MG/ML  SOLN
100.0000 mL | Freq: Once | INTRAMUSCULAR | Status: AC | PRN
  Administered 2022-11-14: 100 mL via INTRAVENOUS

## 2022-11-14 MED ORDER — SODIUM CHLORIDE 0.9 % IV SOLN: Freq: Once | INTRAVENOUS | Status: AC

## 2022-11-14 MED ORDER — HEPARIN SOD (PORK) LOCK FLUSH 100 UNIT/ML IV SOLN: 500.0000 [IU] | INTRAVENOUS | Status: DC | PRN

## 2022-11-14 MED ORDER — SODIUM CHLORIDE 0.9% FLUSH: 10.0000 mL | INTRAVENOUS | Status: DC | PRN

## 2022-11-14 NOTE — Patient Instructions (Signed)

## 2022-11-21 ENCOUNTER — Inpatient Hospital Stay: Payer: BC Managed Care – PPO

## 2022-11-21 ENCOUNTER — Other Ambulatory Visit: Payer: Self-pay

## 2022-11-21 ENCOUNTER — Telehealth: Payer: Self-pay

## 2022-11-21 ENCOUNTER — Inpatient Hospital Stay: Payer: BC Managed Care – PPO | Attending: Nurse Practitioner

## 2022-11-21 VITALS — BP 121/77 | HR 68 | Temp 98.3°F | Resp 18 | Ht 71.0 in | Wt 199.1 lb

## 2022-11-21 DIAGNOSIS — C20 Malignant neoplasm of rectum: Secondary | ICD-10-CM

## 2022-11-21 DIAGNOSIS — Z933 Colostomy status: Secondary | ICD-10-CM | POA: Insufficient documentation

## 2022-11-21 DIAGNOSIS — Z9221 Personal history of antineoplastic chemotherapy: Secondary | ICD-10-CM | POA: Diagnosis not present

## 2022-11-21 DIAGNOSIS — F17210 Nicotine dependence, cigarettes, uncomplicated: Secondary | ICD-10-CM | POA: Diagnosis not present

## 2022-11-21 DIAGNOSIS — Z95828 Presence of other vascular implants and grafts: Secondary | ICD-10-CM

## 2022-11-21 DIAGNOSIS — E860 Dehydration: Secondary | ICD-10-CM | POA: Insufficient documentation

## 2022-11-21 MED ORDER — HEPARIN SOD (PORK) LOCK FLUSH 100 UNIT/ML IV SOLN
500.0000 [IU] | INTRAVENOUS | Status: AC | PRN
  Administered 2022-11-21: 500 [IU]

## 2022-11-21 MED ORDER — SODIUM CHLORIDE 0.9% FLUSH
10.0000 mL | INTRAVENOUS | Status: AC | PRN
  Administered 2022-11-21: 10 mL

## 2022-11-21 MED ORDER — SODIUM CHLORIDE 0.9 % IV SOLN: Freq: Once | INTRAVENOUS | Status: AC

## 2022-11-21 NOTE — Patient Instructions (Signed)

## 2022-11-21 NOTE — Telephone Encounter (Signed)
Patient gave verbal understanding had no further questions or concerns. 

## 2022-11-21 NOTE — Telephone Encounter (Signed)
-----   Message from Kenneth Pier, MD sent at 11/20/2022  8:00 PM EST ----- Please call patient, CTs show no evidence of progressive cancer, follow-up as scheduled

## 2022-11-26 ENCOUNTER — Other Ambulatory Visit: Payer: Self-pay

## 2022-11-26 DIAGNOSIS — C20 Malignant neoplasm of rectum: Secondary | ICD-10-CM

## 2022-11-27 ENCOUNTER — Other Ambulatory Visit (HOSPITAL_BASED_OUTPATIENT_CLINIC_OR_DEPARTMENT_OTHER): Payer: Self-pay

## 2022-11-27 ENCOUNTER — Inpatient Hospital Stay: Payer: BC Managed Care – PPO

## 2022-11-27 ENCOUNTER — Encounter: Payer: Self-pay | Admitting: *Deleted

## 2022-11-27 ENCOUNTER — Inpatient Hospital Stay: Payer: BC Managed Care – PPO | Admitting: Oncology

## 2022-11-27 VITALS — BP 126/87 | HR 89 | Temp 98.2°F | Resp 18 | Ht 71.0 in | Wt 202.6 lb

## 2022-11-27 VITALS — BP 123/83 | HR 67

## 2022-11-27 DIAGNOSIS — F17210 Nicotine dependence, cigarettes, uncomplicated: Secondary | ICD-10-CM | POA: Diagnosis not present

## 2022-11-27 DIAGNOSIS — C20 Malignant neoplasm of rectum: Secondary | ICD-10-CM | POA: Diagnosis not present

## 2022-11-27 DIAGNOSIS — E860 Dehydration: Secondary | ICD-10-CM | POA: Diagnosis not present

## 2022-11-27 DIAGNOSIS — Z933 Colostomy status: Secondary | ICD-10-CM | POA: Diagnosis not present

## 2022-11-27 DIAGNOSIS — Z9221 Personal history of antineoplastic chemotherapy: Secondary | ICD-10-CM | POA: Diagnosis not present

## 2022-11-27 DIAGNOSIS — Z95828 Presence of other vascular implants and grafts: Secondary | ICD-10-CM

## 2022-11-27 LAB — MAGNESIUM: Magnesium: 1.8 mg/dL (ref 1.7–2.4)

## 2022-11-27 LAB — CBC WITH DIFFERENTIAL (CANCER CENTER ONLY)
Abs Immature Granulocytes: 0.02 10*3/uL (ref 0.00–0.07)
Basophils Absolute: 0.1 10*3/uL (ref 0.0–0.1)
Basophils Relative: 1 %
Eosinophils Absolute: 0.3 10*3/uL (ref 0.0–0.5)
Eosinophils Relative: 5 %
HCT: 37.9 % — ABNORMAL LOW (ref 39.0–52.0)
Hemoglobin: 13.1 g/dL (ref 13.0–17.0)
Immature Granulocytes: 0 %
Lymphocytes Relative: 16 %
Lymphs Abs: 1 10*3/uL (ref 0.7–4.0)
MCH: 30.5 pg (ref 26.0–34.0)
MCHC: 34.6 g/dL (ref 30.0–36.0)
MCV: 88.1 fL (ref 80.0–100.0)
Monocytes Absolute: 0.5 10*3/uL (ref 0.1–1.0)
Monocytes Relative: 7 %
Neutro Abs: 4.6 10*3/uL (ref 1.7–7.7)
Neutrophils Relative %: 71 %
Platelet Count: 164 10*3/uL (ref 150–400)
RBC: 4.3 MIL/uL (ref 4.22–5.81)
RDW: 13.8 % (ref 11.5–15.5)
WBC Count: 6.5 10*3/uL (ref 4.0–10.5)
nRBC: 0 % (ref 0.0–0.2)

## 2022-11-27 LAB — CMP (CANCER CENTER ONLY)
ALT: 32 U/L (ref 0–44)
AST: 39 U/L (ref 15–41)
Albumin: 4.3 g/dL (ref 3.5–5.0)
Alkaline Phosphatase: 230 U/L — ABNORMAL HIGH (ref 38–126)
Anion gap: 9 (ref 5–15)
BUN: 21 mg/dL (ref 8–23)
CO2: 27 mmol/L (ref 22–32)
Calcium: 9.8 mg/dL (ref 8.9–10.3)
Chloride: 100 mmol/L (ref 98–111)
Creatinine: 1.53 mg/dL — ABNORMAL HIGH (ref 0.61–1.24)
GFR, Estimated: 51 mL/min — ABNORMAL LOW (ref >60)
Glucose, Bld: 112 mg/dL — ABNORMAL HIGH (ref 70–99)
Potassium: 3.8 mmol/L (ref 3.5–5.1)
Sodium: 136 mmol/L (ref 135–145)
Total Bilirubin: 0.9 mg/dL (ref 0.3–1.2)
Total Protein: 7.3 g/dL (ref 6.5–8.1)

## 2022-11-27 LAB — CEA (ACCESS): CEA (CHCC): 4.43 ng/mL (ref 0.00–5.00)

## 2022-11-27 MED ORDER — HEPARIN SOD (PORK) LOCK FLUSH 100 UNIT/ML IV SOLN
500.0000 [IU] | Freq: Once | INTRAVENOUS | Status: AC
  Administered 2022-11-27: 500 [IU] via INTRAVENOUS

## 2022-11-27 MED ORDER — SODIUM CHLORIDE 0.9% FLUSH
10.0000 mL | Freq: Once | INTRAVENOUS | Status: AC
  Administered 2022-11-27: 10 mL via INTRAVENOUS

## 2022-11-27 MED ORDER — SODIUM CHLORIDE 0.9 % IV SOLN: Freq: Once | INTRAVENOUS | Status: AC

## 2022-11-27 NOTE — Patient Instructions (Signed)

## 2022-11-27 NOTE — Patient Instructions (Signed)

## 2022-11-27 NOTE — Progress Notes (Signed)
PATIENT NAVIGATOR PROGRESS NOTE  Name: Kenneth [name] Date: 11/27/2022 MRN: [id number]  DOB: 12-13-1960   Reason for visit:  Clarification on [facility] one order  Comments:  Spoke with Robin in Pathology at [facility], [place] request of peritoneal nodule from 10/06/22 was a frozen specimen and there was no block, the tissue was deemed not viable.  New [facility] one order entered for Recto-Sigmoid biopsy from [place] number [phone] requested.   Will continue to follow   Per [name]  Thank you for contacting me regarding [name] Aug 29, 1961 and the ongoing need for NGS testing for this patient. When I contacted [facility] to learn why the testing initiated from your office was cancelled is due tissue insufficiency on both cases in the [facility] path lab.      The  [facility] tissues from November of 2023 are unable to be sent for testing :11/20 peritoneal nodule case is not viable for testing due to it being frozen and the  11/7 case is a rectum biopsy only showing high grade dysplasia and not confirmed malignancy.   The tissue that may be viable for testing was sent from [facility] to [place] as a consult and has been returned several months ago.     Time spent counseling/coordinating care: 45-60 minutes

## 2022-11-27 NOTE — Progress Notes (Signed)
Kenneth [name]   Diagnosis: Rectal cancer  INTERVAL HISTORY:   Kenneth [name] returns as scheduled.  He feels well.  He empties the ileostomy 8-9 times per day.  The bag is only half full when he empties.  Good appetite.  He is exercising.  He has a cough in the morning.  He has decreased smoking.  Objective:  Vital signs in last 24 hours:  Blood pressure 126/87, pulse 89, temperature 98.2 F (36.8 C), temperature source Oral, resp. rate 18, height '5\' 11"'$  (1.803 m), weight 202 lb 9.6 oz (91.9 kg), SpO2 100 %.    HEENT: No thrush or ulcers Resp: Lungs clear bilaterally Cardio: Regular rate and rhythm GI: No hepatosplenomegaly or right abdomen ileostomy Vascular: No leg edema   Portacath/PICC-without erythema  Lab Results:  Lab Results  Component Value Date   WBC 6.5 11/27/2022   HGB 13.1 11/27/2022   HCT 37.9 (L) 11/27/2022   MCV 88.1 11/27/2022   PLT 164 11/27/2022   NEUTROABS 4.6 11/27/2022    CMP  Lab Results  Component Value Date   NA 138 11/14/2022   K 4.2 11/14/2022   CL 101 11/14/2022   CO2 29 11/14/2022   GLUCOSE 135 (H) 11/14/2022   BUN 17 11/14/2022   CREATININE 1.35 (H) 11/14/2022   CALCIUM 10.1 11/14/2022   PROT 7.1 10/24/2022   ALBUMIN 4.4 10/24/2022   AST 29 10/24/2022   ALT 26 10/24/2022   ALKPHOS 213 (H) 10/24/2022   BILITOT 0.5 10/24/2022   GFRNONAA 60 (L) 11/14/2022   GFRAA 84 03/17/2008    Lab Results  Component Value Date   CEA 2.58 10/24/2022     Medications: I have reviewed the patient's current medications.   Assessment/Plan: Rectal cancer-mass at 11 cm on proctoscopy by Dr. Morton Stall 02/04/2022 Colonoscopy 12/26/2021-obstructing mass at the rectosigmoid measured at 15 cm, biopsy invasive moderate to poorly differentiated adenocarcinoma, mismatch repair protein expression intact CTs 12/31/2021-masslike circumferential thickening of the distal sigmoid colon with mildly enlarged pericolonic lymph  nodes, numerous subcentimeter hypodense liver lesions, splenomegaly MRI abdomen 01/07/2022-innumerable T2 hyperintense liver lesions consistent with cysts, spleen unremarkable, no ascites or adenopathy, moderate colonic fecal retention, no bowel obstruction MRI pelvis 01/22/2022-T3cN2 tumor above and below the peritoneal reflection, tumor 11.3 cm from the anal verge and 6.1 cm from the sphincter complex, multiple enlarged perirectal nodes, 1.1 cm node versus tumor deposit at the right aspect of the tumor Diverting loop ileostomy 01/15/2022 Cycle 1 FOLFOX 02/17/2022 Cycle 2 FOLFOX 03/04/2022 Cycle 3 FOLFOX held on 03/17/2022 due to neutropenia Cycle 3 FOLFOX 03/17/2022, Udenyca Treatment held 03/31/2022 due to dehydration Cycle 4 FOLFOX 04/07/2022 Cycle 5 FOLFOX 04/22/2022, oxaliplatin dose reduced due to thrombocytopenia Cycle 6 FOLFOX 05/05/2022 Cycle 7 FOLFOX 05/19/2022 Cycle 8 FOLFOX held secondary to patient preference and toxicity (diarrhea/weight loss/neuropathy symptoms) 06/16/2022 radiation/Xeloda-07/24/2022 MRI 08/27/2022-no suspicious lymph nodes; significant reduction in size with near complete resolution of previously identified tumor deposit versus node along the right aspect of the tumor; overall reduction in bulk and signal of mucinous high rectal malignancy, mrTRG-3 10/06/2022-exploratory laparotomy and laparoscopy-carcinomatosis with low-volume peritoneal disease, dominant area adjacent to sigmoid and right lower quadrant, less than 5 mm implants in the right upper quadrant and left lower quadrant.  PCI 5 CTs 11/14/2022-decrease circumferential wall thickening in the upper rectum and decreased mesorectal lymphadenopathy, no evidence of distant metastatic disease, stable mild splenomegaly, focal airspace opacity in the left upper lobe-likely inflammatory Gout Psoriasis Hypertension Left scalene  node versus cyst/lipoma on exam 02/03/2022-CT neck 02/10/2022 with 9.5 mm lymph node in the left  supraclavicular region posterior to the sternocleidomastoid muscle.  No other prominent lymph nodes in the neck.  Biopsy 02/12/2022 compatible with benign lymph node, negative for metastatic carcinoma.     Disposition: Kenneth [name] appears stable.  He continues to have high output from the ileostomy.  I encouraged him to increase fluid intake.  He continues weekly IV fluids at the Cancer center. The restaging CTs reveal no evidence of progressive rectal cancer.  There is no "measurable "disease.  I discussed proceeding with FOLFIRI/bevacizumab versus continued observation.  We are waiting on results from [facility] 1 testing on the peritoneal nodule biopsy.  I will contact Dr. Morton Stall regarding the molecular testing.  Kenneth [name] is comfortable with continued observation.  We will plan for repeat imaging in 3-4 months.  He will return for an office visit in 4 weeks.  Betsy Coder, MD  11/27/2022  9:15 AM

## 2022-11-28 ENCOUNTER — Encounter: Payer: Self-pay | Admitting: *Deleted

## 2022-11-28 NOTE — Progress Notes (Signed)
Notified by [facility] that order for testing is received and they are working on obtaining the tissue sample.

## 2022-12-02 ENCOUNTER — Other Ambulatory Visit: Payer: Self-pay

## 2022-12-02 DIAGNOSIS — C20 Malignant neoplasm of rectum: Secondary | ICD-10-CM

## 2022-12-04 ENCOUNTER — Other Ambulatory Visit: Payer: Self-pay

## 2022-12-04 ENCOUNTER — Other Ambulatory Visit (HOSPITAL_BASED_OUTPATIENT_CLINIC_OR_DEPARTMENT_OTHER): Payer: Self-pay

## 2022-12-05 ENCOUNTER — Other Ambulatory Visit (HOSPITAL_BASED_OUTPATIENT_CLINIC_OR_DEPARTMENT_OTHER): Payer: Self-pay

## 2022-12-05 ENCOUNTER — Other Ambulatory Visit: Payer: Self-pay

## 2022-12-05 ENCOUNTER — Inpatient Hospital Stay: Payer: BC Managed Care – PPO

## 2022-12-05 VITALS — BP 122/80 | HR 72 | Temp 98.1°F | Resp 18

## 2022-12-05 DIAGNOSIS — Z933 Colostomy status: Secondary | ICD-10-CM | POA: Diagnosis not present

## 2022-12-05 DIAGNOSIS — Z9221 Personal history of antineoplastic chemotherapy: Secondary | ICD-10-CM | POA: Diagnosis not present

## 2022-12-05 DIAGNOSIS — C20 Malignant neoplasm of rectum: Secondary | ICD-10-CM | POA: Diagnosis not present

## 2022-12-05 DIAGNOSIS — Z95828 Presence of other vascular implants and grafts: Secondary | ICD-10-CM

## 2022-12-05 DIAGNOSIS — F17210 Nicotine dependence, cigarettes, uncomplicated: Secondary | ICD-10-CM | POA: Diagnosis not present

## 2022-12-05 DIAGNOSIS — E860 Dehydration: Secondary | ICD-10-CM | POA: Diagnosis not present

## 2022-12-05 MED ORDER — SODIUM CHLORIDE 0.9 % IV SOLN: Freq: Once | INTRAVENOUS | Status: AC

## 2022-12-05 MED ORDER — HEPARIN SOD (PORK) LOCK FLUSH 100 UNIT/ML IV SOLN
500.0000 [IU] | Freq: Once | INTRAVENOUS | Status: AC
  Administered 2022-12-05: 500 [IU] via INTRAVENOUS

## 2022-12-05 MED ORDER — SODIUM CHLORIDE 0.9% FLUSH
10.0000 mL | Freq: Once | INTRAVENOUS | Status: AC
  Administered 2022-12-05: 10 mL via INTRAVENOUS

## 2022-12-05 NOTE — Patient Instructions (Signed)

## 2022-12-08 DIAGNOSIS — Z932 Ileostomy status: Secondary | ICD-10-CM | POA: Diagnosis not present

## 2022-12-08 DIAGNOSIS — C20 Malignant neoplasm of rectum: Secondary | ICD-10-CM | POA: Diagnosis not present

## 2022-12-09 ENCOUNTER — Other Ambulatory Visit: Payer: Self-pay | Admitting: *Deleted

## 2022-12-09 ENCOUNTER — Telehealth: Payer: Self-pay | Admitting: *Deleted

## 2022-12-09 DIAGNOSIS — C20 Malignant neoplasm of rectum: Secondary | ICD-10-CM

## 2022-12-09 NOTE — Telephone Encounter (Signed)
Left VM for wife (patient's phone) that [facility] One testing shows he is not candidate for immune therapy, but is candidate for EGFR inhibitor therapy. F/U as scheduled and will provide him a copy at 1/26 IVF appointment.

## 2022-12-12 ENCOUNTER — Inpatient Hospital Stay: Payer: BC Managed Care – PPO

## 2022-12-12 VITALS — BP 112/81 | HR 71 | Temp 97.3°F | Resp 17 | Ht 71.0 in | Wt 201.8 lb

## 2022-12-12 DIAGNOSIS — Z9221 Personal history of antineoplastic chemotherapy: Secondary | ICD-10-CM | POA: Diagnosis not present

## 2022-12-12 DIAGNOSIS — C20 Malignant neoplasm of rectum: Secondary | ICD-10-CM | POA: Diagnosis not present

## 2022-12-12 DIAGNOSIS — E860 Dehydration: Secondary | ICD-10-CM | POA: Diagnosis not present

## 2022-12-12 DIAGNOSIS — F17210 Nicotine dependence, cigarettes, uncomplicated: Secondary | ICD-10-CM | POA: Diagnosis not present

## 2022-12-12 DIAGNOSIS — Z933 Colostomy status: Secondary | ICD-10-CM | POA: Diagnosis not present

## 2022-12-12 DIAGNOSIS — Z95828 Presence of other vascular implants and grafts: Secondary | ICD-10-CM

## 2022-12-12 MED ORDER — SODIUM CHLORIDE 0.9% FLUSH
10.0000 mL | Freq: Once | INTRAVENOUS | Status: AC
  Administered 2022-12-12: 10 mL via INTRAVENOUS

## 2022-12-12 MED ORDER — SODIUM CHLORIDE 0.9 % IV SOLN: INTRAVENOUS | Status: AC

## 2022-12-12 MED ORDER — HEPARIN SOD (PORK) LOCK FLUSH 100 UNIT/ML IV SOLN
500.0000 [IU] | Freq: Once | INTRAVENOUS | Status: AC
  Administered 2022-12-12: 500 [IU] via INTRAVENOUS

## 2022-12-12 NOTE — Patient Instructions (Signed)

## 2022-12-15 ENCOUNTER — Other Ambulatory Visit: Payer: BC Managed Care – PPO

## 2022-12-15 ENCOUNTER — Inpatient Hospital Stay: Payer: BC Managed Care – PPO

## 2022-12-15 ENCOUNTER — Other Ambulatory Visit (HOSPITAL_BASED_OUTPATIENT_CLINIC_OR_DEPARTMENT_OTHER): Payer: BC Managed Care – PPO

## 2022-12-17 DIAGNOSIS — Z932 Ileostomy status: Secondary | ICD-10-CM | POA: Diagnosis not present

## 2022-12-18 ENCOUNTER — Other Ambulatory Visit: Payer: Self-pay

## 2022-12-18 DIAGNOSIS — E871 Hypo-osmolality and hyponatremia: Secondary | ICD-10-CM

## 2022-12-18 DIAGNOSIS — C20 Malignant neoplasm of rectum: Secondary | ICD-10-CM

## 2022-12-19 ENCOUNTER — Inpatient Hospital Stay: Payer: BC Managed Care – PPO | Attending: Nurse Practitioner

## 2022-12-19 VITALS — BP 124/81 | HR 70 | Temp 98.4°F | Resp 18 | Ht 71.0 in | Wt 202.0 lb

## 2022-12-19 DIAGNOSIS — Z923 Personal history of irradiation: Secondary | ICD-10-CM | POA: Diagnosis not present

## 2022-12-19 DIAGNOSIS — Z9221 Personal history of antineoplastic chemotherapy: Secondary | ICD-10-CM | POA: Insufficient documentation

## 2022-12-19 DIAGNOSIS — Z95828 Presence of other vascular implants and grafts: Secondary | ICD-10-CM

## 2022-12-19 DIAGNOSIS — F17210 Nicotine dependence, cigarettes, uncomplicated: Secondary | ICD-10-CM | POA: Diagnosis not present

## 2022-12-19 DIAGNOSIS — E860 Dehydration: Secondary | ICD-10-CM | POA: Insufficient documentation

## 2022-12-19 DIAGNOSIS — R059 Cough, unspecified: Secondary | ICD-10-CM | POA: Diagnosis not present

## 2022-12-19 DIAGNOSIS — C20 Malignant neoplasm of rectum: Secondary | ICD-10-CM | POA: Diagnosis not present

## 2022-12-19 MED ORDER — SODIUM CHLORIDE 0.9 % IV SOLN: Freq: Once | INTRAVENOUS | Status: AC

## 2022-12-19 MED ORDER — SODIUM CHLORIDE 0.9% FLUSH
10.0000 mL | INTRAVENOUS | Status: AC | PRN
  Administered 2022-12-19: 10 mL

## 2022-12-19 MED ORDER — HEPARIN SOD (PORK) LOCK FLUSH 100 UNIT/ML IV SOLN
500.0000 [IU] | INTRAVENOUS | Status: AC | PRN
  Administered 2022-12-19: 500 [IU]

## 2022-12-19 NOTE — Patient Instructions (Signed)

## 2022-12-26 ENCOUNTER — Inpatient Hospital Stay: Payer: BC Managed Care – PPO

## 2022-12-26 ENCOUNTER — Inpatient Hospital Stay: Payer: BC Managed Care – PPO | Admitting: Oncology

## 2022-12-26 ENCOUNTER — Other Ambulatory Visit (HOSPITAL_BASED_OUTPATIENT_CLINIC_OR_DEPARTMENT_OTHER): Payer: Self-pay

## 2022-12-26 VITALS — BP 135/85 | HR 91 | Temp 98.2°F | Resp 18 | Ht 71.0 in | Wt 205.0 lb

## 2022-12-26 VITALS — BP 124/81 | HR 74 | Resp 18

## 2022-12-26 DIAGNOSIS — C20 Malignant neoplasm of rectum: Secondary | ICD-10-CM

## 2022-12-26 DIAGNOSIS — E860 Dehydration: Secondary | ICD-10-CM | POA: Diagnosis not present

## 2022-12-26 DIAGNOSIS — Z9221 Personal history of antineoplastic chemotherapy: Secondary | ICD-10-CM | POA: Diagnosis not present

## 2022-12-26 DIAGNOSIS — R059 Cough, unspecified: Secondary | ICD-10-CM | POA: Diagnosis not present

## 2022-12-26 DIAGNOSIS — F17210 Nicotine dependence, cigarettes, uncomplicated: Secondary | ICD-10-CM | POA: Diagnosis not present

## 2022-12-26 DIAGNOSIS — Z95828 Presence of other vascular implants and grafts: Secondary | ICD-10-CM

## 2022-12-26 DIAGNOSIS — Z923 Personal history of irradiation: Secondary | ICD-10-CM | POA: Diagnosis not present

## 2022-12-26 LAB — CBC WITH DIFFERENTIAL (CANCER CENTER ONLY)
Abs Immature Granulocytes: 0.02 10*3/uL (ref 0.00–0.07)
Basophils Absolute: 0.1 10*3/uL (ref 0.0–0.1)
Basophils Relative: 1 %
Eosinophils Absolute: 0.2 10*3/uL (ref 0.0–0.5)
Eosinophils Relative: 3 %
HCT: 37.6 % — ABNORMAL LOW (ref 39.0–52.0)
Hemoglobin: 12.8 g/dL — ABNORMAL LOW (ref 13.0–17.0)
Immature Granulocytes: 0 %
Lymphocytes Relative: 10 %
Lymphs Abs: 0.8 10*3/uL (ref 0.7–4.0)
MCH: 30.8 pg (ref 26.0–34.0)
MCHC: 34 g/dL (ref 30.0–36.0)
MCV: 90.6 fL (ref 80.0–100.0)
Monocytes Absolute: 0.5 10*3/uL (ref 0.1–1.0)
Monocytes Relative: 7 %
Neutro Abs: 5.7 10*3/uL (ref 1.7–7.7)
Neutrophils Relative %: 79 %
Platelet Count: 171 10*3/uL (ref 150–400)
RBC: 4.15 MIL/uL — ABNORMAL LOW (ref 4.22–5.81)
RDW: 14.1 % (ref 11.5–15.5)
WBC Count: 7.3 10*3/uL (ref 4.0–10.5)
nRBC: 0 % (ref 0.0–0.2)

## 2022-12-26 LAB — CMP (CANCER CENTER ONLY)
ALT: 17 U/L (ref 0–44)
AST: 19 U/L (ref 15–41)
Albumin: 4 g/dL (ref 3.5–5.0)
Alkaline Phosphatase: 175 U/L — ABNORMAL HIGH (ref 38–126)
Anion gap: 8 (ref 5–15)
BUN: 12 mg/dL (ref 8–23)
CO2: 29 mmol/L (ref 22–32)
Calcium: 9.2 mg/dL (ref 8.9–10.3)
Chloride: 104 mmol/L (ref 98–111)
Creatinine: 1.46 mg/dL — ABNORMAL HIGH (ref 0.61–1.24)
GFR, Estimated: 54 mL/min — ABNORMAL LOW (ref >60)
Glucose, Bld: 134 mg/dL — ABNORMAL HIGH (ref 70–99)
Potassium: 3.5 mmol/L (ref 3.5–5.1)
Sodium: 141 mmol/L (ref 135–145)
Total Bilirubin: 0.6 mg/dL (ref 0.3–1.2)
Total Protein: 7 g/dL (ref 6.5–8.1)

## 2022-12-26 LAB — MAGNESIUM: Magnesium: 1.7 mg/dL (ref 1.7–2.4)

## 2022-12-26 MED ORDER — ALBUTEROL SULFATE HFA 108 (90 BASE) MCG/ACT IN AERS
2.0000 | INHALATION_SPRAY | Freq: Four times a day (QID) | RESPIRATORY_TRACT | 3 refills | Status: DC | PRN
  Filled 2022-12-26: qty 6.7, 25d supply, fill #0

## 2022-12-26 MED ORDER — SODIUM CHLORIDE 0.9 % IV SOLN: INTRAVENOUS | Status: DC

## 2022-12-26 MED ORDER — SODIUM CHLORIDE 0.9% FLUSH
10.0000 mL | INTRAVENOUS | Status: AC | PRN
  Administered 2022-12-26: 10 mL

## 2022-12-26 MED ORDER — HEPARIN SOD (PORK) LOCK FLUSH 100 UNIT/ML IV SOLN
500.0000 [IU] | INTRAVENOUS | Status: AC | PRN
  Administered 2022-12-26: 500 [IU]

## 2022-12-26 NOTE — Progress Notes (Signed)
[place] OFFICE PROGRESS NOTE   Diagnosis: Rectal cancer  INTERVAL HISTORY:   Mr. Schomberg presents as scheduled.  He continues weekly IV fluids.  He feels weak over the few days prior to receiving fluids.  He empties the ostomy bag several times per day.  He takes Imodium and Lomotil 4 times daily.  He reports a cough in the mornings.  Objective:  Vital signs in last 24 hours:  Blood pressure 135/85, pulse 91, temperature 98.2 F (36.8 C), temperature source Oral, resp. rate 18, height 5' 11"$  (1.803 m), weight 205 lb (93 kg), SpO2 99 %.    HEENT: No thrush or ulcers Resp: Lungs clear bilaterally Cardio: Regular rate and rhythm GI: No hepatosplenomegaly, nontender, no mass, right upper quadrant ostomy Vascular: No leg edema  Portacath/PICC-without erythema  Lab Results:  Lab Results  Component Value Date   WBC 7.3 12/26/2022   HGB 12.8 (L) 12/26/2022   HCT 37.6 (L) 12/26/2022   MCV 90.6 12/26/2022   PLT 171 12/26/2022   NEUTROABS 5.7 12/26/2022    CMP  Lab Results  Component Value Date   NA 141 12/26/2022   K 3.5 12/26/2022   CL 104 12/26/2022   CO2 29 12/26/2022   GLUCOSE 134 (H) 12/26/2022   BUN 12 12/26/2022   CREATININE 1.46 (H) 12/26/2022   CALCIUM 9.2 12/26/2022   PROT 7.0 12/26/2022   ALBUMIN 4.0 12/26/2022   AST 19 12/26/2022   ALT 17 12/26/2022   ALKPHOS 175 (H) 12/26/2022   BILITOT 0.6 12/26/2022   GFRNONAA 54 (L) 12/26/2022   GFRAA 84 03/17/2008    Lab Results  Component Value Date   CEA 4.43 11/27/2022    Medications: I have reviewed the patient's current medications.   Assessment/Plan: Rectal cancer-mass at 11 cm on proctoscopy by Dr. Morton Stall 02/04/2022 Colonoscopy 12/26/2021-obstructing mass at the rectosigmoid measured at 15 cm, biopsy invasive moderate to poorly differentiated adenocarcinoma, mismatch repair protein expression intact, MSS, tumor mutation burden 4, RAS wild-type CTs 12/31/2021-masslike circumferential  thickening of the distal sigmoid colon with mildly enlarged pericolonic lymph nodes, numerous subcentimeter hypodense liver lesions, splenomegaly MRI abdomen 01/07/2022-innumerable T2 hyperintense liver lesions consistent with cysts, spleen unremarkable, no ascites or adenopathy, moderate colonic fecal retention, no bowel obstruction MRI pelvis 01/22/2022-T3cN2 tumor above and below the peritoneal reflection, tumor 11.3 cm from the anal verge and 6.1 cm from the sphincter complex, multiple enlarged perirectal nodes, 1.1 cm node versus tumor deposit at the right aspect of the tumor Diverting loop ileostomy 01/15/2022 Cycle 1 FOLFOX 02/17/2022 Cycle 2 FOLFOX 03/04/2022 Cycle 3 FOLFOX held on 03/17/2022 due to neutropenia Cycle 3 FOLFOX 03/17/2022, Udenyca Treatment held 03/31/2022 due to dehydration Cycle 4 FOLFOX 04/07/2022 Cycle 5 FOLFOX 04/22/2022, oxaliplatin dose reduced due to thrombocytopenia Cycle 6 FOLFOX 05/05/2022 Cycle 7 FOLFOX 05/19/2022 Cycle 8 FOLFOX held secondary to patient preference and toxicity (diarrhea/weight loss/neuropathy symptoms) 06/16/2022 radiation/Xeloda-07/24/2022 MRI 08/27/2022-no suspicious lymph nodes; significant reduction in size with near complete resolution of previously identified tumor deposit versus node along the right aspect of the tumor; overall reduction in bulk and signal of mucinous high rectal malignancy, mrTRG-3 10/06/2022-exploratory laparotomy and laparoscopy-carcinomatosis with low-volume peritoneal disease, dominant area adjacent to sigmoid and right lower quadrant, less than 5 mm implants in the right upper quadrant and left lower quadrant.  PCI 5 CTs 11/14/2022-decrease circumferential wall thickening in the upper rectum and decreased mesorectal lymphadenopathy, no evidence of distant metastatic disease, stable mild splenomegaly, focal airspace opacity in the  left upper lobe-likely inflammatory Gout Psoriasis Hypertension Left scalene node versus cyst/lipoma on  exam 02/03/2022-CT neck 02/10/2022 with 9.5 mm lymph node in the left supraclavicular region posterior to the sternocleidomastoid muscle.  No other prominent lymph nodes in the neck.  Biopsy 02/12/2022 compatible with benign lymph node, negative for metastatic carcinoma.       Disposition: [name] has metastatic rectal cancer.  CTs in December showed no measurable disease.  He appears asymptomatic.  He continues to have symptoms from the output ileostomy.  I reviewed results from [facility] 1 testing on the rectal biopsy with Mr. Muffler and his wife.  He does not appear to be an immunotherapy candidate.  He is a candidate for an EGFR inhibitor.  I recommend treatment with FOLFIRI/panitumumab when he develops clinical evidence of disease progression or measurable disease.  He is appealing to the insurance company to approve a staging PET scan.  He would like to increase the IV fluids to twice weekly.  He will receive IV fluids today and again on 12/30/2022.  He will return for an office visit in 1 month.  Betsy Coder, MD  12/26/2022  9:41 AM

## 2022-12-26 NOTE — Patient Instructions (Signed)

## 2022-12-28 ENCOUNTER — Encounter: Payer: Self-pay | Admitting: Oncology

## 2022-12-29 ENCOUNTER — Other Ambulatory Visit (HOSPITAL_BASED_OUTPATIENT_CLINIC_OR_DEPARTMENT_OTHER): Payer: Self-pay

## 2022-12-29 ENCOUNTER — Telehealth: Payer: Self-pay | Admitting: *Deleted

## 2022-12-29 ENCOUNTER — Other Ambulatory Visit: Payer: Self-pay | Admitting: *Deleted

## 2022-12-29 DIAGNOSIS — C20 Malignant neoplasm of rectum: Secondary | ICD-10-CM

## 2022-12-29 MED ORDER — DIPHENOXYLATE-ATROPINE 2.5-0.025 MG PO TABS: 2.0000 | ORAL_TABLET | Freq: Four times a day (QID) | ORAL | 2 refills | Status: DC | PRN

## 2022-12-29 MED ORDER — DIPHENOXYLATE-ATROPINE 2.5-0.025 MG PO TABS
2.0000 | ORAL_TABLET | Freq: Four times a day (QID) | ORAL | 2 refills | Status: DC | PRN
  Filled 2022-12-29: qty 240, 30d supply, fill #0

## 2022-12-29 NOTE — Telephone Encounter (Signed)
Refilled Lomotil per patient request

## 2022-12-31 ENCOUNTER — Inpatient Hospital Stay: Payer: BC Managed Care – PPO

## 2022-12-31 VITALS — BP 123/84 | HR 68 | Temp 98.1°F | Resp 18 | Ht 71.0 in | Wt 207.4 lb

## 2022-12-31 DIAGNOSIS — C20 Malignant neoplasm of rectum: Secondary | ICD-10-CM | POA: Diagnosis not present

## 2022-12-31 DIAGNOSIS — E860 Dehydration: Secondary | ICD-10-CM | POA: Diagnosis not present

## 2022-12-31 DIAGNOSIS — R059 Cough, unspecified: Secondary | ICD-10-CM | POA: Diagnosis not present

## 2022-12-31 DIAGNOSIS — Z923 Personal history of irradiation: Secondary | ICD-10-CM | POA: Diagnosis not present

## 2022-12-31 DIAGNOSIS — Z9221 Personal history of antineoplastic chemotherapy: Secondary | ICD-10-CM | POA: Diagnosis not present

## 2022-12-31 DIAGNOSIS — F17210 Nicotine dependence, cigarettes, uncomplicated: Secondary | ICD-10-CM | POA: Diagnosis not present

## 2022-12-31 DIAGNOSIS — Z95828 Presence of other vascular implants and grafts: Secondary | ICD-10-CM

## 2022-12-31 MED ORDER — SODIUM CHLORIDE 0.9 % IV SOLN: INTRAVENOUS | Status: DC

## 2022-12-31 MED ORDER — SODIUM CHLORIDE 0.9% FLUSH
10.0000 mL | Freq: Once | INTRAVENOUS | Status: AC
  Administered 2022-12-31: 10 mL via INTRAVENOUS

## 2022-12-31 MED ORDER — HEPARIN SOD (PORK) LOCK FLUSH 100 UNIT/ML IV SOLN
500.0000 [IU] | Freq: Once | INTRAVENOUS | Status: AC
  Administered 2022-12-31: 500 [IU] via INTRAVENOUS

## 2022-12-31 NOTE — Patient Instructions (Signed)

## 2023-01-02 ENCOUNTER — Other Ambulatory Visit (HOSPITAL_BASED_OUTPATIENT_CLINIC_OR_DEPARTMENT_OTHER): Payer: Self-pay

## 2023-01-02 ENCOUNTER — Inpatient Hospital Stay: Payer: BC Managed Care – PPO

## 2023-01-02 VITALS — BP 135/90 | HR 66 | Temp 97.8°F | Resp 18

## 2023-01-02 DIAGNOSIS — R059 Cough, unspecified: Secondary | ICD-10-CM | POA: Diagnosis not present

## 2023-01-02 DIAGNOSIS — Z9221 Personal history of antineoplastic chemotherapy: Secondary | ICD-10-CM | POA: Diagnosis not present

## 2023-01-02 DIAGNOSIS — E860 Dehydration: Secondary | ICD-10-CM | POA: Diagnosis not present

## 2023-01-02 DIAGNOSIS — C20 Malignant neoplasm of rectum: Secondary | ICD-10-CM

## 2023-01-02 DIAGNOSIS — F17210 Nicotine dependence, cigarettes, uncomplicated: Secondary | ICD-10-CM | POA: Diagnosis not present

## 2023-01-02 DIAGNOSIS — Z95828 Presence of other vascular implants and grafts: Secondary | ICD-10-CM

## 2023-01-02 DIAGNOSIS — Z923 Personal history of irradiation: Secondary | ICD-10-CM | POA: Diagnosis not present

## 2023-01-02 MED ORDER — SODIUM CHLORIDE 0.9% FLUSH
10.0000 mL | INTRAVENOUS | Status: AC | PRN
  Administered 2023-01-02: 10 mL

## 2023-01-02 MED ORDER — SODIUM CHLORIDE 0.9 % IV SOLN: INTRAVENOUS | Status: AC

## 2023-01-02 MED ORDER — HEPARIN SOD (PORK) LOCK FLUSH 100 UNIT/ML IV SOLN
500.0000 [IU] | INTRAVENOUS | Status: AC | PRN
  Administered 2023-01-02: 500 [IU]

## 2023-01-02 NOTE — Patient Instructions (Signed)

## 2023-01-05 ENCOUNTER — Other Ambulatory Visit: Payer: Self-pay | Admitting: *Deleted

## 2023-01-05 ENCOUNTER — Other Ambulatory Visit (HOSPITAL_BASED_OUTPATIENT_CLINIC_OR_DEPARTMENT_OTHER): Payer: Self-pay

## 2023-01-05 ENCOUNTER — Other Ambulatory Visit: Payer: Self-pay

## 2023-01-05 DIAGNOSIS — C20 Malignant neoplasm of rectum: Secondary | ICD-10-CM

## 2023-01-05 MED ORDER — SODIUM CHLORIDE 0.9 % IV SOLN: INTRAVENOUS | Status: DC

## 2023-01-06 ENCOUNTER — Inpatient Hospital Stay: Payer: BC Managed Care – PPO

## 2023-01-06 ENCOUNTER — Inpatient Hospital Stay: Payer: BC Managed Care – PPO | Admitting: Licensed Clinical Social Worker

## 2023-01-06 ENCOUNTER — Other Ambulatory Visit: Payer: Self-pay

## 2023-01-06 VITALS — BP 126/87 | HR 71 | Temp 98.1°F | Resp 18 | Ht 71.0 in | Wt 210.0 lb

## 2023-01-06 DIAGNOSIS — C20 Malignant neoplasm of rectum: Secondary | ICD-10-CM | POA: Diagnosis not present

## 2023-01-06 DIAGNOSIS — F17210 Nicotine dependence, cigarettes, uncomplicated: Secondary | ICD-10-CM | POA: Diagnosis not present

## 2023-01-06 DIAGNOSIS — E871 Hypo-osmolality and hyponatremia: Secondary | ICD-10-CM

## 2023-01-06 DIAGNOSIS — Z95828 Presence of other vascular implants and grafts: Secondary | ICD-10-CM

## 2023-01-06 DIAGNOSIS — Z9221 Personal history of antineoplastic chemotherapy: Secondary | ICD-10-CM | POA: Diagnosis not present

## 2023-01-06 DIAGNOSIS — E860 Dehydration: Secondary | ICD-10-CM | POA: Diagnosis not present

## 2023-01-06 DIAGNOSIS — Z923 Personal history of irradiation: Secondary | ICD-10-CM | POA: Diagnosis not present

## 2023-01-06 DIAGNOSIS — R059 Cough, unspecified: Secondary | ICD-10-CM | POA: Diagnosis not present

## 2023-01-06 MED ORDER — SODIUM CHLORIDE 0.9% FLUSH
10.0000 mL | Freq: Once | INTRAVENOUS | Status: AC
  Administered 2023-01-06: 10 mL via INTRAVENOUS

## 2023-01-06 MED ORDER — SODIUM CHLORIDE 0.9 % IV SOLN: Freq: Once | INTRAVENOUS | Status: AC

## 2023-01-06 MED ORDER — HEPARIN SOD (PORK) LOCK FLUSH 100 UNIT/ML IV SOLN
500.0000 [IU] | Freq: Once | INTRAVENOUS | Status: AC
  Administered 2023-01-06: 500 [IU] via INTRAVENOUS

## 2023-01-06 NOTE — Progress Notes (Signed)
[facility] Work  Initial Assessment   Kenneth [name] is a 62 y.o. year old male presenting alone. Clinical Social Work met with patient while he was in infusion to assess needs.  SDOH (Social Determinants of Health) assessments performed: Yes SDOH Interventions    Flowsheet Row Clinical Support from 01/06/2023 in [facility] Interventions   Food Insecurity Interventions Intervention Not Indicated  Housing Interventions Intervention Not Indicated  Transportation Interventions Intervention Not Indicated  Utilities Interventions Intervention Not Indicated  Financial Strain Interventions Intervention Not Indicated  Stress Interventions Intervention Not Indicated       SDOH Screenings   Food Insecurity: No Food Insecurity (01/06/2023)  Housing: Low Risk  (01/06/2023)  Transportation Needs: No Transportation Needs (01/06/2023)  Utilities: Not At Risk (01/06/2023)  Depression (PHQ2-9): Low Risk  (01/06/2023)  Financial Resource Strain: Low Risk  (01/06/2023)  Stress: No Stress Concern Present (01/06/2023)  Tobacco Use: High Risk (09/02/2022)     Distress Screen completed: No    06/09/2022    8:16 AM  ONCBCN DISTRESS SCREENING  Distress experienced in past week (1-10) 6  Emotional problem type Nervousness/Anxiety;Adjusting to illness      Family/Social Information:  Housing Arrangement: patient lives with his wife,  [name]. Family members/support persons in your life? Family Transportation concerns: no  Employment: Working full time at [employer]. Income source: Employment Financial concerns: No Type of concern: None Food access concerns: no Religious or spiritual practice: Not known Services Currently in place:  [employer]  Coping/ Adjustment to diagnosis: Patient understands treatment plan and what happens next? yes Concerns about diagnosis and/or treatment:  Patient expressed no current concerns.  He was previously  concerned about his weight loss. Patient reported stressors:  None per patient. Hopes and/or priorities: Family Patient enjoys time with family/ friends Current coping skills/ strengths: Capable of independent living , Armed forces logistics/support/administrative officer , Scientist, research (life sciences) , Solicitor fund of knowledge , Motivation for treatment/growth , and Supportive family/friends     SUMMARY: Current SDOH Barriers:  None  Clinical Social Work Clinical Goal(s):  No clinical social work goals at this time  Interventions: Discussed common feeling and emotions when being diagnosed with cancer, and the importance of support during treatment Informed patient of the support team roles and support services at [facility] Provided [facility] contact information and encouraged patient to call with any questions or concerns Provided patient with information about the [employer] and [employer] and [employer].   Follow Up Plan: Patient will contact CSW with any support or resource needs Patient verbalizes understanding of plan: Yes    Rodman Pickle Wilhelmina Hark, LCSW

## 2023-01-07 ENCOUNTER — Other Ambulatory Visit (HOSPITAL_BASED_OUTPATIENT_CLINIC_OR_DEPARTMENT_OTHER): Payer: Self-pay

## 2023-01-07 ENCOUNTER — Other Ambulatory Visit: Payer: Self-pay

## 2023-01-07 DIAGNOSIS — E871 Hypo-osmolality and hyponatremia: Secondary | ICD-10-CM

## 2023-01-09 ENCOUNTER — Inpatient Hospital Stay: Payer: BC Managed Care – PPO

## 2023-01-09 VITALS — BP 129/79 | HR 68 | Temp 97.9°F | Resp 18

## 2023-01-09 DIAGNOSIS — F17210 Nicotine dependence, cigarettes, uncomplicated: Secondary | ICD-10-CM | POA: Diagnosis not present

## 2023-01-09 DIAGNOSIS — E860 Dehydration: Secondary | ICD-10-CM | POA: Diagnosis not present

## 2023-01-09 DIAGNOSIS — R059 Cough, unspecified: Secondary | ICD-10-CM | POA: Diagnosis not present

## 2023-01-09 DIAGNOSIS — E871 Hypo-osmolality and hyponatremia: Secondary | ICD-10-CM

## 2023-01-09 DIAGNOSIS — Z95828 Presence of other vascular implants and grafts: Secondary | ICD-10-CM

## 2023-01-09 DIAGNOSIS — C20 Malignant neoplasm of rectum: Secondary | ICD-10-CM | POA: Diagnosis not present

## 2023-01-09 DIAGNOSIS — Z923 Personal history of irradiation: Secondary | ICD-10-CM | POA: Diagnosis not present

## 2023-01-09 DIAGNOSIS — Z9221 Personal history of antineoplastic chemotherapy: Secondary | ICD-10-CM | POA: Diagnosis not present

## 2023-01-09 LAB — GUARDANT 360

## 2023-01-09 MED ORDER — HEPARIN SOD (PORK) LOCK FLUSH 100 UNIT/ML IV SOLN
500.0000 [IU] | INTRAVENOUS | Status: AC | PRN
  Administered 2023-01-09: 500 [IU]

## 2023-01-09 MED ORDER — SODIUM CHLORIDE 0.9% FLUSH
10.0000 mL | INTRAVENOUS | Status: AC | PRN
  Administered 2023-01-09: 10 mL

## 2023-01-09 MED ORDER — SODIUM CHLORIDE 0.9 % IV SOLN: Freq: Once | INTRAVENOUS | Status: AC

## 2023-01-09 NOTE — Patient Instructions (Signed)

## 2023-01-12 ENCOUNTER — Other Ambulatory Visit: Payer: Self-pay | Admitting: *Deleted

## 2023-01-12 ENCOUNTER — Other Ambulatory Visit: Payer: Self-pay

## 2023-01-12 DIAGNOSIS — E871 Hypo-osmolality and hyponatremia: Secondary | ICD-10-CM

## 2023-01-13 ENCOUNTER — Inpatient Hospital Stay: Payer: BC Managed Care – PPO

## 2023-01-13 VITALS — BP 131/86 | HR 67 | Temp 98.1°F | Resp 18 | Ht 71.0 in | Wt 210.2 lb

## 2023-01-13 DIAGNOSIS — R059 Cough, unspecified: Secondary | ICD-10-CM | POA: Diagnosis not present

## 2023-01-13 DIAGNOSIS — Z95828 Presence of other vascular implants and grafts: Secondary | ICD-10-CM

## 2023-01-13 DIAGNOSIS — E860 Dehydration: Secondary | ICD-10-CM | POA: Diagnosis not present

## 2023-01-13 DIAGNOSIS — Z923 Personal history of irradiation: Secondary | ICD-10-CM | POA: Diagnosis not present

## 2023-01-13 DIAGNOSIS — E871 Hypo-osmolality and hyponatremia: Secondary | ICD-10-CM

## 2023-01-13 DIAGNOSIS — C20 Malignant neoplasm of rectum: Secondary | ICD-10-CM | POA: Diagnosis not present

## 2023-01-13 DIAGNOSIS — F17210 Nicotine dependence, cigarettes, uncomplicated: Secondary | ICD-10-CM | POA: Diagnosis not present

## 2023-01-13 DIAGNOSIS — Z9221 Personal history of antineoplastic chemotherapy: Secondary | ICD-10-CM | POA: Diagnosis not present

## 2023-01-13 MED ORDER — SODIUM CHLORIDE 0.9% FLUSH
10.0000 mL | INTRAVENOUS | Status: AC | PRN
  Administered 2023-01-13: 10 mL

## 2023-01-13 MED ORDER — SODIUM CHLORIDE 0.9 % IV SOLN: INTRAVENOUS | Status: AC

## 2023-01-13 MED ORDER — HEPARIN SOD (PORK) LOCK FLUSH 100 UNIT/ML IV SOLN
500.0000 [IU] | INTRAVENOUS | Status: AC | PRN
  Administered 2023-01-13: 500 [IU]

## 2023-01-13 NOTE — Patient Instructions (Signed)

## 2023-01-16 ENCOUNTER — Other Ambulatory Visit: Payer: Self-pay

## 2023-01-16 ENCOUNTER — Inpatient Hospital Stay: Payer: BC Managed Care – PPO | Attending: Nurse Practitioner

## 2023-01-16 VITALS — BP 130/82 | HR 68 | Temp 98.0°F | Resp 20

## 2023-01-16 DIAGNOSIS — Z923 Personal history of irradiation: Secondary | ICD-10-CM | POA: Diagnosis not present

## 2023-01-16 DIAGNOSIS — E871 Hypo-osmolality and hyponatremia: Secondary | ICD-10-CM

## 2023-01-16 DIAGNOSIS — C20 Malignant neoplasm of rectum: Secondary | ICD-10-CM | POA: Diagnosis not present

## 2023-01-16 DIAGNOSIS — Z9221 Personal history of antineoplastic chemotherapy: Secondary | ICD-10-CM | POA: Diagnosis not present

## 2023-01-16 DIAGNOSIS — F17210 Nicotine dependence, cigarettes, uncomplicated: Secondary | ICD-10-CM | POA: Diagnosis not present

## 2023-01-16 DIAGNOSIS — Z95828 Presence of other vascular implants and grafts: Secondary | ICD-10-CM

## 2023-01-16 DIAGNOSIS — E860 Dehydration: Secondary | ICD-10-CM | POA: Insufficient documentation

## 2023-01-16 MED ORDER — SODIUM CHLORIDE 0.9 % IV SOLN: INTRAVENOUS | Status: AC

## 2023-01-16 MED ORDER — SODIUM CHLORIDE 0.9% FLUSH
10.0000 mL | INTRAVENOUS | Status: AC | PRN
  Administered 2023-01-16: 10 mL

## 2023-01-16 MED ORDER — HEPARIN SOD (PORK) LOCK FLUSH 100 UNIT/ML IV SOLN
500.0000 [IU] | INTRAVENOUS | Status: AC | PRN
  Administered 2023-01-16: 500 [IU]

## 2023-01-16 NOTE — Patient Instructions (Signed)

## 2023-01-19 ENCOUNTER — Other Ambulatory Visit: Payer: Self-pay | Admitting: *Deleted

## 2023-01-19 DIAGNOSIS — E871 Hypo-osmolality and hyponatremia: Secondary | ICD-10-CM

## 2023-01-19 MED ORDER — SODIUM CHLORIDE 0.9 % IV SOLN: INTRAVENOUS | Status: DC

## 2023-01-20 ENCOUNTER — Inpatient Hospital Stay: Payer: BC Managed Care – PPO

## 2023-01-20 VITALS — BP 127/88 | HR 62 | Temp 98.6°F | Resp 18 | Ht 71.0 in | Wt 211.0 lb

## 2023-01-20 DIAGNOSIS — E871 Hypo-osmolality and hyponatremia: Secondary | ICD-10-CM

## 2023-01-20 DIAGNOSIS — F17210 Nicotine dependence, cigarettes, uncomplicated: Secondary | ICD-10-CM | POA: Diagnosis not present

## 2023-01-20 DIAGNOSIS — Z923 Personal history of irradiation: Secondary | ICD-10-CM | POA: Diagnosis not present

## 2023-01-20 DIAGNOSIS — Z95828 Presence of other vascular implants and grafts: Secondary | ICD-10-CM

## 2023-01-20 DIAGNOSIS — Z9221 Personal history of antineoplastic chemotherapy: Secondary | ICD-10-CM | POA: Diagnosis not present

## 2023-01-20 DIAGNOSIS — C20 Malignant neoplasm of rectum: Secondary | ICD-10-CM | POA: Diagnosis not present

## 2023-01-20 DIAGNOSIS — E860 Dehydration: Secondary | ICD-10-CM | POA: Diagnosis not present

## 2023-01-20 MED ORDER — SODIUM CHLORIDE 0.9% FLUSH
10.0000 mL | INTRAVENOUS | Status: AC | PRN
  Administered 2023-01-20: 10 mL

## 2023-01-20 MED ORDER — HEPARIN SOD (PORK) LOCK FLUSH 100 UNIT/ML IV SOLN
500.0000 [IU] | INTRAVENOUS | Status: AC | PRN
  Administered 2023-01-20: 500 [IU]

## 2023-01-20 MED ORDER — SODIUM CHLORIDE 0.9 % IV SOLN: INTRAVENOUS | Status: DC

## 2023-01-20 NOTE — Patient Instructions (Signed)

## 2023-01-23 ENCOUNTER — Other Ambulatory Visit (HOSPITAL_BASED_OUTPATIENT_CLINIC_OR_DEPARTMENT_OTHER): Payer: Self-pay

## 2023-01-23 ENCOUNTER — Inpatient Hospital Stay: Payer: BC Managed Care – PPO

## 2023-01-23 ENCOUNTER — Inpatient Hospital Stay (HOSPITAL_BASED_OUTPATIENT_CLINIC_OR_DEPARTMENT_OTHER): Payer: BC Managed Care – PPO | Admitting: Nurse Practitioner

## 2023-01-23 ENCOUNTER — Encounter: Payer: Self-pay | Admitting: Nurse Practitioner

## 2023-01-23 ENCOUNTER — Encounter: Payer: Self-pay | Admitting: *Deleted

## 2023-01-23 VITALS — BP 130/91 | HR 62 | Resp 18

## 2023-01-23 VITALS — BP 130/83 | HR 72 | Temp 98.1°F | Resp 18 | Ht 71.0 in | Wt 208.6 lb

## 2023-01-23 DIAGNOSIS — Z9221 Personal history of antineoplastic chemotherapy: Secondary | ICD-10-CM | POA: Diagnosis not present

## 2023-01-23 DIAGNOSIS — E871 Hypo-osmolality and hyponatremia: Secondary | ICD-10-CM

## 2023-01-23 DIAGNOSIS — Z923 Personal history of irradiation: Secondary | ICD-10-CM | POA: Diagnosis not present

## 2023-01-23 DIAGNOSIS — Z95828 Presence of other vascular implants and grafts: Secondary | ICD-10-CM

## 2023-01-23 DIAGNOSIS — E860 Dehydration: Secondary | ICD-10-CM | POA: Diagnosis not present

## 2023-01-23 DIAGNOSIS — C20 Malignant neoplasm of rectum: Secondary | ICD-10-CM | POA: Diagnosis not present

## 2023-01-23 DIAGNOSIS — F17210 Nicotine dependence, cigarettes, uncomplicated: Secondary | ICD-10-CM | POA: Diagnosis not present

## 2023-01-23 LAB — CMP (CANCER CENTER ONLY)
ALT: 19 U/L (ref 0–44)
AST: 30 U/L (ref 15–41)
Albumin: 3.9 g/dL (ref 3.5–5.0)
Alkaline Phosphatase: 158 U/L — ABNORMAL HIGH (ref 38–126)
Anion gap: 5 (ref 5–15)
BUN: 17 mg/dL (ref 8–23)
CO2: 29 mmol/L (ref 22–32)
Calcium: 9.2 mg/dL (ref 8.9–10.3)
Chloride: 102 mmol/L (ref 98–111)
Creatinine: 1.77 mg/dL — ABNORMAL HIGH (ref 0.61–1.24)
GFR, Estimated: 43 mL/min — ABNORMAL LOW (ref >60)
Glucose, Bld: 131 mg/dL — ABNORMAL HIGH (ref 70–99)
Potassium: 4 mmol/L (ref 3.5–5.1)
Sodium: 136 mmol/L (ref 135–145)
Total Bilirubin: 0.5 mg/dL (ref 0.3–1.2)
Total Protein: 6.9 g/dL (ref 6.5–8.1)

## 2023-01-23 LAB — CBC WITH DIFFERENTIAL (CANCER CENTER ONLY)
Abs Immature Granulocytes: 0.01 10*3/uL (ref 0.00–0.07)
Basophils Absolute: 0 10*3/uL (ref 0.0–0.1)
Basophils Relative: 0 %
Eosinophils Absolute: 0.2 10*3/uL (ref 0.0–0.5)
Eosinophils Relative: 4 %
HCT: 42.2 % (ref 39.0–52.0)
Hemoglobin: 13.5 g/dL (ref 13.0–17.0)
Immature Granulocytes: 0 %
Lymphocytes Relative: 18 %
Lymphs Abs: 0.7 10*3/uL (ref 0.7–4.0)
MCH: 28.9 pg (ref 26.0–34.0)
MCHC: 32 g/dL (ref 30.0–36.0)
MCV: 90.4 fL (ref 80.0–100.0)
Monocytes Absolute: 0.3 10*3/uL (ref 0.1–1.0)
Monocytes Relative: 7 %
Neutro Abs: 2.9 10*3/uL (ref 1.7–7.7)
Neutrophils Relative %: 71 %
Platelet Count: 166 10*3/uL (ref 150–400)
RBC: 4.67 MIL/uL (ref 4.22–5.81)
RDW: 13.8 % (ref 11.5–15.5)
WBC Count: 4 10*3/uL (ref 4.0–10.5)
nRBC: 0 % (ref 0.0–0.2)

## 2023-01-23 LAB — CEA (ACCESS): CEA (CHCC): 2.25 ng/mL (ref 0.00–5.00)

## 2023-01-23 LAB — MAGNESIUM: Magnesium: 1.9 mg/dL (ref 1.7–2.4)

## 2023-01-23 MED ORDER — HEPARIN SOD (PORK) LOCK FLUSH 100 UNIT/ML IV SOLN
500.0000 [IU] | INTRAVENOUS | Status: AC | PRN
  Administered 2023-01-23: 500 [IU]

## 2023-01-23 MED ORDER — SODIUM CHLORIDE 0.9% FLUSH
10.0000 mL | INTRAVENOUS | Status: AC | PRN
  Administered 2023-01-23: 10 mL

## 2023-01-23 MED ORDER — SODIUM CHLORIDE 0.9 % IV SOLN: INTRAVENOUS | Status: DC

## 2023-01-23 MED ORDER — DIPHENOXYLATE-ATROPINE 2.5-0.025 MG PO TABS
2.0000 | ORAL_TABLET | Freq: Four times a day (QID) | ORAL | 2 refills | Status: DC | PRN
  Filled 2023-01-23: qty 240, 30d supply, fill #0
  Filled 2023-02-08 – 2023-03-01 (×2): qty 240, 30d supply, fill #1
  Filled 2023-03-30: qty 240, 30d supply, fill #2

## 2023-01-23 NOTE — Progress Notes (Signed)
Patient seen by Lisa Thomas NP today  Vitals are within treatment parameters.  Labs reviewed by Lisa Thomas NP and are within treatment parameters.  Per physician team, patient is ready for treatment and there are NO modifications to the treatment plan.     

## 2023-01-23 NOTE — Patient Instructions (Signed)

## 2023-01-23 NOTE — Progress Notes (Signed)
Mr. Jozwiak brought in denial letter from [facility] for appeal of PET scan. Sent to HIM to scan

## 2023-01-23 NOTE — Progress Notes (Signed)
[place] OFFICE PROGRESS NOTE   Diagnosis: Rectal cancer  INTERVAL HISTORY:   Kenneth [name] returns as scheduled.  He feels well.  He denies pain.  He has a good appetite.  He is exercising on a regular basis.  Ostomy output under control with Lomotil and Imodium.  No nausea or vomiting.  Objective:  Vital signs in last 24 hours:  Blood pressure 130/83, pulse 72, temperature 98.1 F (36.7 C), temperature source Oral, resp. rate 18, height '5\' 11"'$  (1.803 m), weight 208 lb 9.6 oz (94.6 kg), SpO2 100 %.    HEENT: No thrush or ulcers. Lymphatics: No palpable cervical, supraclavicular or axillary lymph nodes. Resp: Lungs clear bilaterally. Cardio: Regular rate and rhythm. GI: Abdomen soft and nontender.  No hepatosplenomegaly.  No mass.  Right upper quadrant ostomy with thick liquid stool in the collection bag. Vascular: No leg edema. Port-A-Cath without erythema.  Lab Results:  Lab Results  Component Value Date   WBC 7.3 12/26/2022   HGB 12.8 (L) 12/26/2022   HCT 37.6 (L) 12/26/2022   MCV 90.6 12/26/2022   PLT 171 12/26/2022   NEUTROABS 5.7 12/26/2022    Imaging:  No results found.  Medications: I have reviewed the patient's current medications.  Assessment/Plan: Rectal cancer-mass at 11 cm on proctoscopy by Dr. [name] 02/04/2022 Colonoscopy 12/26/2021-obstructing mass at the rectosigmoid measured at 15 cm, biopsy invasive moderate to poorly differentiated adenocarcinoma, mismatch repair protein expression intact, MSS, tumor mutation burden 4, RAS wild-type CTs 12/31/2021-masslike circumferential thickening of the distal sigmoid colon with mildly enlarged pericolonic lymph nodes, numerous subcentimeter hypodense liver lesions, splenomegaly MRI abdomen 01/07/2022-innumerable T2 hyperintense liver lesions consistent with cysts, spleen unremarkable, no ascites or adenopathy, moderate colonic fecal retention, no bowel obstruction MRI pelvis 01/22/2022-T3cN2 tumor  above and below the peritoneal reflection, tumor 11.3 cm from the anal verge and 6.1 cm from the sphincter complex, multiple enlarged perirectal nodes, 1.1 cm node versus tumor deposit at the right aspect of the tumor Diverting loop ileostomy 01/15/2022 Cycle 1 FOLFOX 02/17/2022 Cycle 2 FOLFOX 03/04/2022 Cycle 3 FOLFOX held on 03/17/2022 due to neutropenia Cycle 3 FOLFOX 03/17/2022, Udenyca Treatment held 03/31/2022 due to dehydration Cycle 4 FOLFOX 04/07/2022 Cycle 5 FOLFOX 04/22/2022, oxaliplatin dose reduced due to thrombocytopenia Cycle 6 FOLFOX 05/05/2022 Cycle 7 FOLFOX 05/19/2022 Cycle 8 FOLFOX held secondary to patient preference and toxicity (diarrhea/weight loss/neuropathy symptoms) 06/16/2022 radiation/Xeloda-07/24/2022 MRI 08/27/2022-no suspicious lymph nodes; significant reduction in size with near complete resolution of previously identified tumor deposit versus node along the right aspect of the tumor; overall reduction in bulk and signal of mucinous high rectal malignancy, mrTRG-3 10/06/2022-exploratory laparotomy and laparoscopy-carcinomatosis with low-volume peritoneal disease, dominant area adjacent to sigmoid and right lower quadrant, less than 5 mm implants in the right upper quadrant and left lower quadrant.  PCI 5 CTs 11/14/2022-decrease circumferential wall thickening in the upper rectum and decreased mesorectal lymphadenopathy, no evidence of distant metastatic disease, stable mild splenomegaly, focal airspace opacity in the left upper lobe-likely inflammatory Gout Psoriasis Hypertension Left scalene node versus cyst/lipoma on exam 02/03/2022-CT neck 02/10/2022 with 9.5 mm lymph node in the left supraclavicular region posterior to the sternocleidomastoid muscle.  No other prominent lymph nodes in the neck.  Biopsy 02/12/2022 compatible with benign lymph node, negative for metastatic carcinoma.      Disposition: Kenneth [name] appears stable.  There is no clinical evidence of disease  progression.  Plan to continue to follow with observation.    Due to a high  output ileostomy he receives IV fluids twice a week, continue the same.  He is maximizing Lomotil and Imodium.    We reviewed the CBC and chemistry panel from today.  Creatinine is a little higher than he typically runs.  We will repeat in 1 week.  He will return for follow-up in 4 weeks.  He will contact the office in the interim with any problems.    Kenneth [name] ANP/GNP-BC   01/23/2023  9:44 AM

## 2023-01-26 ENCOUNTER — Other Ambulatory Visit: Payer: Self-pay

## 2023-01-26 DIAGNOSIS — E871 Hypo-osmolality and hyponatremia: Secondary | ICD-10-CM

## 2023-01-26 DIAGNOSIS — C20 Malignant neoplasm of rectum: Secondary | ICD-10-CM

## 2023-01-27 ENCOUNTER — Inpatient Hospital Stay: Payer: BC Managed Care – PPO

## 2023-01-27 DIAGNOSIS — Z923 Personal history of irradiation: Secondary | ICD-10-CM | POA: Diagnosis not present

## 2023-01-27 DIAGNOSIS — C20 Malignant neoplasm of rectum: Secondary | ICD-10-CM

## 2023-01-27 DIAGNOSIS — Z9221 Personal history of antineoplastic chemotherapy: Secondary | ICD-10-CM | POA: Diagnosis not present

## 2023-01-27 DIAGNOSIS — F17210 Nicotine dependence, cigarettes, uncomplicated: Secondary | ICD-10-CM | POA: Diagnosis not present

## 2023-01-27 DIAGNOSIS — E860 Dehydration: Secondary | ICD-10-CM | POA: Diagnosis not present

## 2023-01-27 MED ORDER — SODIUM CHLORIDE 0.9 % IV SOLN: Freq: Once | INTRAVENOUS | Status: AC

## 2023-01-27 MED ORDER — HEPARIN SOD (PORK) LOCK FLUSH 100 UNIT/ML IV SOLN
500.0000 [IU] | Freq: Once | INTRAVENOUS | Status: AC
  Administered 2023-01-27: 500 [IU] via INTRAVENOUS

## 2023-01-27 MED ORDER — SODIUM CHLORIDE 0.9% FLUSH
10.0000 mL | Freq: Once | INTRAVENOUS | Status: AC
  Administered 2023-01-27: 10 mL via INTRAVENOUS

## 2023-01-27 NOTE — Patient Instructions (Signed)

## 2023-01-30 ENCOUNTER — Inpatient Hospital Stay: Payer: BC Managed Care – PPO

## 2023-01-30 VITALS — BP 121/85 | HR 62 | Temp 98.2°F | Resp 18

## 2023-01-30 DIAGNOSIS — E860 Dehydration: Secondary | ICD-10-CM | POA: Diagnosis not present

## 2023-01-30 DIAGNOSIS — Z923 Personal history of irradiation: Secondary | ICD-10-CM | POA: Diagnosis not present

## 2023-01-30 DIAGNOSIS — C20 Malignant neoplasm of rectum: Secondary | ICD-10-CM | POA: Diagnosis not present

## 2023-01-30 DIAGNOSIS — Z95828 Presence of other vascular implants and grafts: Secondary | ICD-10-CM

## 2023-01-30 DIAGNOSIS — Z9221 Personal history of antineoplastic chemotherapy: Secondary | ICD-10-CM | POA: Diagnosis not present

## 2023-01-30 DIAGNOSIS — F17210 Nicotine dependence, cigarettes, uncomplicated: Secondary | ICD-10-CM | POA: Diagnosis not present

## 2023-01-30 LAB — BASIC METABOLIC PANEL - CANCER CENTER ONLY
Anion gap: 5 (ref 5–15)
BUN: 13 mg/dL (ref 8–23)
CO2: 28 mmol/L (ref 22–32)
Calcium: 9.5 mg/dL (ref 8.9–10.3)
Chloride: 104 mmol/L (ref 98–111)
Creatinine: 1.76 mg/dL — ABNORMAL HIGH (ref 0.61–1.24)
GFR, Estimated: 43 mL/min — ABNORMAL LOW (ref >60)
Glucose, Bld: 128 mg/dL — ABNORMAL HIGH (ref 70–99)
Potassium: 3.9 mmol/L (ref 3.5–5.1)
Sodium: 137 mmol/L (ref 135–145)

## 2023-01-30 MED ORDER — SODIUM CHLORIDE 0.9 % IV SOLN: Freq: Once | INTRAVENOUS | Status: AC

## 2023-01-30 MED ORDER — SODIUM CHLORIDE 0.9% FLUSH
10.0000 mL | INTRAVENOUS | Status: AC | PRN
  Administered 2023-01-30: 10 mL

## 2023-01-30 MED ORDER — HEPARIN SOD (PORK) LOCK FLUSH 100 UNIT/ML IV SOLN
500.0000 [IU] | INTRAVENOUS | Status: AC | PRN
  Administered 2023-01-30: 500 [IU]

## 2023-01-30 NOTE — Patient Instructions (Signed)

## 2023-02-02 ENCOUNTER — Other Ambulatory Visit: Payer: Self-pay | Admitting: *Deleted

## 2023-02-02 DIAGNOSIS — C20 Malignant neoplasm of rectum: Secondary | ICD-10-CM

## 2023-02-03 ENCOUNTER — Other Ambulatory Visit: Payer: Self-pay | Admitting: Nurse Practitioner

## 2023-02-03 ENCOUNTER — Inpatient Hospital Stay: Payer: BC Managed Care – PPO

## 2023-02-03 VITALS — BP 119/80 | HR 66 | Temp 97.8°F | Resp 18 | Ht 71.0 in | Wt 209.2 lb

## 2023-02-03 DIAGNOSIS — E860 Dehydration: Secondary | ICD-10-CM | POA: Diagnosis not present

## 2023-02-03 DIAGNOSIS — Z923 Personal history of irradiation: Secondary | ICD-10-CM | POA: Diagnosis not present

## 2023-02-03 DIAGNOSIS — C20 Malignant neoplasm of rectum: Secondary | ICD-10-CM

## 2023-02-03 DIAGNOSIS — Z95828 Presence of other vascular implants and grafts: Secondary | ICD-10-CM

## 2023-02-03 DIAGNOSIS — F17210 Nicotine dependence, cigarettes, uncomplicated: Secondary | ICD-10-CM | POA: Diagnosis not present

## 2023-02-03 DIAGNOSIS — Z9221 Personal history of antineoplastic chemotherapy: Secondary | ICD-10-CM | POA: Diagnosis not present

## 2023-02-03 MED ORDER — HEPARIN SOD (PORK) LOCK FLUSH 100 UNIT/ML IV SOLN
500.0000 [IU] | INTRAVENOUS | Status: AC | PRN
  Administered 2023-02-03: 500 [IU]

## 2023-02-03 MED ORDER — SODIUM CHLORIDE 0.9 % IV SOLN: INTRAVENOUS | Status: AC

## 2023-02-03 MED ORDER — SODIUM CHLORIDE 0.9% FLUSH
10.0000 mL | INTRAVENOUS | Status: AC | PRN
  Administered 2023-02-03: 10 mL

## 2023-02-03 NOTE — Patient Instructions (Signed)

## 2023-02-05 ENCOUNTER — Other Ambulatory Visit: Payer: Self-pay | Admitting: Nurse Practitioner

## 2023-02-05 DIAGNOSIS — E876 Hypokalemia: Secondary | ICD-10-CM

## 2023-02-05 DIAGNOSIS — C20 Malignant neoplasm of rectum: Secondary | ICD-10-CM

## 2023-02-06 ENCOUNTER — Inpatient Hospital Stay: Payer: BC Managed Care – PPO

## 2023-02-06 ENCOUNTER — Other Ambulatory Visit (HOSPITAL_BASED_OUTPATIENT_CLINIC_OR_DEPARTMENT_OTHER): Payer: Self-pay

## 2023-02-06 VITALS — BP 117/80 | HR 63 | Temp 98.1°F | Resp 18

## 2023-02-06 DIAGNOSIS — E860 Dehydration: Secondary | ICD-10-CM | POA: Diagnosis not present

## 2023-02-06 DIAGNOSIS — C20 Malignant neoplasm of rectum: Secondary | ICD-10-CM

## 2023-02-06 DIAGNOSIS — Z9221 Personal history of antineoplastic chemotherapy: Secondary | ICD-10-CM | POA: Diagnosis not present

## 2023-02-06 DIAGNOSIS — F17210 Nicotine dependence, cigarettes, uncomplicated: Secondary | ICD-10-CM | POA: Diagnosis not present

## 2023-02-06 DIAGNOSIS — Z95828 Presence of other vascular implants and grafts: Secondary | ICD-10-CM

## 2023-02-06 DIAGNOSIS — Z923 Personal history of irradiation: Secondary | ICD-10-CM | POA: Diagnosis not present

## 2023-02-06 LAB — BASIC METABOLIC PANEL - CANCER CENTER ONLY
Anion gap: 7 (ref 5–15)
BUN: 13 mg/dL (ref 8–23)
CO2: 26 mmol/L (ref 22–32)
Calcium: 9.5 mg/dL (ref 8.9–10.3)
Chloride: 105 mmol/L (ref 98–111)
Creatinine: 1.6 mg/dL — ABNORMAL HIGH (ref 0.61–1.24)
GFR, Estimated: 49 mL/min — ABNORMAL LOW (ref >60)
Glucose, Bld: 113 mg/dL — ABNORMAL HIGH (ref 70–99)
Potassium: 4 mmol/L (ref 3.5–5.1)
Sodium: 138 mmol/L (ref 135–145)

## 2023-02-06 MED ORDER — SODIUM CHLORIDE 0.9% FLUSH
10.0000 mL | Freq: Once | INTRAVENOUS | Status: AC
  Administered 2023-02-06: 10 mL via INTRAVENOUS

## 2023-02-06 MED ORDER — SODIUM CHLORIDE 0.9 % IV SOLN: INTRAVENOUS | Status: AC

## 2023-02-06 MED ORDER — POTASSIUM CHLORIDE CRYS ER 20 MEQ PO TBCR
20.0000 meq | EXTENDED_RELEASE_TABLET | Freq: Every day | ORAL | 3 refills | Status: DC
  Filled 2023-02-06: qty 30, 30d supply, fill #0
  Filled 2023-03-05: qty 30, 30d supply, fill #1
  Filled 2023-04-07: qty 30, 30d supply, fill #2
  Filled 2023-05-05: qty 30, 30d supply, fill #3

## 2023-02-06 MED ORDER — HEPARIN SOD (PORK) LOCK FLUSH 100 UNIT/ML IV SOLN
500.0000 [IU] | Freq: Once | INTRAVENOUS | Status: AC
  Administered 2023-02-06: 500 [IU] via INTRAVENOUS

## 2023-02-06 NOTE — Patient Instructions (Signed)

## 2023-02-06 NOTE — Patient Instructions (Signed)

## 2023-02-08 ENCOUNTER — Encounter (HOSPITAL_BASED_OUTPATIENT_CLINIC_OR_DEPARTMENT_OTHER): Payer: Self-pay | Admitting: Pharmacist

## 2023-02-08 ENCOUNTER — Other Ambulatory Visit (HOSPITAL_BASED_OUTPATIENT_CLINIC_OR_DEPARTMENT_OTHER): Payer: Self-pay

## 2023-02-09 ENCOUNTER — Other Ambulatory Visit: Payer: Self-pay | Admitting: *Deleted

## 2023-02-09 DIAGNOSIS — C20 Malignant neoplasm of rectum: Secondary | ICD-10-CM

## 2023-02-10 ENCOUNTER — Other Ambulatory Visit (HOSPITAL_BASED_OUTPATIENT_CLINIC_OR_DEPARTMENT_OTHER): Payer: Self-pay

## 2023-02-10 ENCOUNTER — Inpatient Hospital Stay: Payer: BC Managed Care – PPO

## 2023-02-10 VITALS — BP 124/84 | HR 66 | Temp 98.1°F | Resp 18 | Ht 71.0 in | Wt 210.2 lb

## 2023-02-10 DIAGNOSIS — Z923 Personal history of irradiation: Secondary | ICD-10-CM | POA: Diagnosis not present

## 2023-02-10 DIAGNOSIS — C20 Malignant neoplasm of rectum: Secondary | ICD-10-CM | POA: Diagnosis not present

## 2023-02-10 DIAGNOSIS — Z95828 Presence of other vascular implants and grafts: Secondary | ICD-10-CM

## 2023-02-10 DIAGNOSIS — E860 Dehydration: Secondary | ICD-10-CM | POA: Diagnosis not present

## 2023-02-10 DIAGNOSIS — F17210 Nicotine dependence, cigarettes, uncomplicated: Secondary | ICD-10-CM | POA: Diagnosis not present

## 2023-02-10 DIAGNOSIS — Z9221 Personal history of antineoplastic chemotherapy: Secondary | ICD-10-CM | POA: Diagnosis not present

## 2023-02-10 MED ORDER — SODIUM CHLORIDE 0.9 % IV SOLN: INTRAVENOUS | Status: AC

## 2023-02-10 MED ORDER — SODIUM CHLORIDE 0.9% FLUSH
10.0000 mL | Freq: Once | INTRAVENOUS | Status: AC
  Administered 2023-02-10: 10 mL via INTRAVENOUS

## 2023-02-10 MED ORDER — HEPARIN SOD (PORK) LOCK FLUSH 100 UNIT/ML IV SOLN
500.0000 [IU] | Freq: Once | INTRAVENOUS | Status: AC
  Administered 2023-02-10: 500 [IU] via INTRAVENOUS

## 2023-02-10 NOTE — Patient Instructions (Signed)

## 2023-02-11 DIAGNOSIS — Z932 Ileostomy status: Secondary | ICD-10-CM | POA: Diagnosis not present

## 2023-02-13 ENCOUNTER — Other Ambulatory Visit (HOSPITAL_BASED_OUTPATIENT_CLINIC_OR_DEPARTMENT_OTHER): Payer: Self-pay

## 2023-02-13 ENCOUNTER — Inpatient Hospital Stay: Payer: BC Managed Care – PPO

## 2023-02-13 VITALS — BP 129/83 | HR 66 | Temp 97.9°F | Resp 12

## 2023-02-13 DIAGNOSIS — F17210 Nicotine dependence, cigarettes, uncomplicated: Secondary | ICD-10-CM | POA: Diagnosis not present

## 2023-02-13 DIAGNOSIS — Z923 Personal history of irradiation: Secondary | ICD-10-CM | POA: Diagnosis not present

## 2023-02-13 DIAGNOSIS — Z95828 Presence of other vascular implants and grafts: Secondary | ICD-10-CM

## 2023-02-13 DIAGNOSIS — C20 Malignant neoplasm of rectum: Secondary | ICD-10-CM

## 2023-02-13 DIAGNOSIS — E860 Dehydration: Secondary | ICD-10-CM | POA: Diagnosis not present

## 2023-02-13 DIAGNOSIS — Z9221 Personal history of antineoplastic chemotherapy: Secondary | ICD-10-CM | POA: Diagnosis not present

## 2023-02-13 MED ORDER — LIDOCAINE-PRILOCAINE 2.5-2.5 % EX CREA
1.0000 | TOPICAL_CREAM | CUTANEOUS | 5 refills | Status: DC | PRN
  Filled 2023-02-13: qty 30, 30d supply, fill #0
  Filled 2023-03-30 – 2023-03-31 (×2): qty 30, 30d supply, fill #1

## 2023-02-13 MED ORDER — SODIUM CHLORIDE 0.9% FLUSH
10.0000 mL | INTRAVENOUS | Status: DC | PRN
  Administered 2023-02-13: 10 mL via INTRAVENOUS

## 2023-02-13 MED ORDER — HEPARIN SOD (PORK) LOCK FLUSH 100 UNIT/ML IV SOLN
500.0000 [IU] | Freq: Once | INTRAVENOUS | Status: AC
  Administered 2023-02-13: 500 [IU] via INTRAVENOUS

## 2023-02-13 MED ORDER — SODIUM CHLORIDE 0.9 % IV SOLN: INTRAVENOUS | Status: AC

## 2023-02-13 NOTE — Patient Instructions (Signed)

## 2023-02-16 ENCOUNTER — Other Ambulatory Visit: Payer: Self-pay | Admitting: *Deleted

## 2023-02-16 DIAGNOSIS — C20 Malignant neoplasm of rectum: Secondary | ICD-10-CM

## 2023-02-16 MED ORDER — SODIUM CHLORIDE 0.9 % IV SOLN: INTRAVENOUS | Status: DC

## 2023-02-17 ENCOUNTER — Inpatient Hospital Stay: Payer: BC Managed Care – PPO

## 2023-02-17 ENCOUNTER — Other Ambulatory Visit: Payer: Self-pay | Admitting: *Deleted

## 2023-02-17 ENCOUNTER — Inpatient Hospital Stay: Payer: BC Managed Care – PPO | Attending: Oncology | Admitting: Oncology

## 2023-02-17 ENCOUNTER — Other Ambulatory Visit (HOSPITAL_BASED_OUTPATIENT_CLINIC_OR_DEPARTMENT_OTHER): Payer: Self-pay

## 2023-02-17 VITALS — BP 128/90 | HR 69 | Temp 98.1°F | Resp 18 | Ht 71.0 in | Wt 211.6 lb

## 2023-02-17 VITALS — BP 120/87 | HR 68

## 2023-02-17 DIAGNOSIS — E860 Dehydration: Secondary | ICD-10-CM | POA: Diagnosis not present

## 2023-02-17 DIAGNOSIS — C20 Malignant neoplasm of rectum: Secondary | ICD-10-CM

## 2023-02-17 DIAGNOSIS — Z9221 Personal history of antineoplastic chemotherapy: Secondary | ICD-10-CM | POA: Diagnosis not present

## 2023-02-17 DIAGNOSIS — Z95828 Presence of other vascular implants and grafts: Secondary | ICD-10-CM

## 2023-02-17 DIAGNOSIS — F17210 Nicotine dependence, cigarettes, uncomplicated: Secondary | ICD-10-CM | POA: Diagnosis not present

## 2023-02-17 DIAGNOSIS — Z923 Personal history of irradiation: Secondary | ICD-10-CM | POA: Insufficient documentation

## 2023-02-17 LAB — CMP (CANCER CENTER ONLY)
ALT: 14 U/L (ref 10–47)
AST: 20 U/L (ref 11–38)
Albumin: 3.9 g/dL (ref 3.5–5.0)
Alkaline Phosphatase: 143 U/L — ABNORMAL HIGH (ref 38–126)
Anion gap: 6 (ref 5–15)
BUN: 12 mg/dL (ref 8–23)
CO2: 28 mmol/L (ref 22–32)
Calcium: 9.3 mg/dL (ref 8.9–10.3)
Chloride: 104 mmol/L (ref 98–111)
Creatinine: 1.48 mg/dL — ABNORMAL HIGH (ref 0.60–1.20)
GFR, Estimated: 53 mL/min — ABNORMAL LOW (ref >60)
Glucose, Bld: 101 mg/dL — ABNORMAL HIGH (ref 70–99)
Potassium: 4 mmol/L (ref 3.5–5.1)
Sodium: 138 mmol/L (ref 135–145)
Total Bilirubin: 0.4 mg/dL (ref 0.2–1.6)
Total Protein: 6.7 g/dL (ref 6.5–8.1)

## 2023-02-17 LAB — CBC WITH DIFFERENTIAL (CANCER CENTER ONLY)
Abs Immature Granulocytes: 0.02 10*3/uL (ref 0.00–0.07)
Basophils Absolute: 0 10*3/uL (ref 0.0–0.1)
Basophils Relative: 1 %
Eosinophils Absolute: 0.2 10*3/uL (ref 0.0–0.5)
Eosinophils Relative: 3 %
HCT: 43.4 % (ref 39.0–52.0)
Hemoglobin: 13.9 g/dL (ref 13.0–17.0)
Immature Granulocytes: 0 %
Lymphocytes Relative: 11 %
Lymphs Abs: 0.8 10*3/uL (ref 0.7–4.0)
MCH: 28.6 pg (ref 26.0–34.0)
MCHC: 32 g/dL (ref 30.0–36.0)
MCV: 89.3 fL (ref 80.0–100.0)
Monocytes Absolute: 0.5 10*3/uL (ref 0.1–1.0)
Monocytes Relative: 7 %
Neutro Abs: 5.4 10*3/uL (ref 1.7–7.7)
Neutrophils Relative %: 78 %
Platelet Count: 160 10*3/uL (ref 150–400)
RBC: 4.86 MIL/uL (ref 4.22–5.81)
RDW: 13.7 % (ref 11.5–15.5)
WBC Count: 6.9 10*3/uL (ref 4.0–10.5)
nRBC: 0 % (ref 0.0–0.2)

## 2023-02-17 LAB — MAGNESIUM: Magnesium: 1.7 mg/dL (ref 1.7–2.4)

## 2023-02-17 LAB — CEA (ACCESS): CEA (CHCC): 1.86 ng/mL (ref 0.00–5.00)

## 2023-02-17 MED ORDER — HEPARIN SOD (PORK) LOCK FLUSH 100 UNIT/ML IV SOLN
500.0000 [IU] | Freq: Once | INTRAVENOUS | Status: AC
  Administered 2023-02-17: 500 [IU] via INTRAVENOUS

## 2023-02-17 MED ORDER — SODIUM CHLORIDE 0.9% FLUSH
10.0000 mL | Freq: Once | INTRAVENOUS | Status: AC
  Administered 2023-02-17: 10 mL via INTRAVENOUS

## 2023-02-17 MED ORDER — SODIUM CHLORIDE 0.9 % IV SOLN: INTRAVENOUS | Status: AC

## 2023-02-17 NOTE — Progress Notes (Signed)
[place] OFFICE PROGRESS NOTE   Diagnosis: Rectal cancer  INTERVAL HISTORY:   [name] returns as scheduled.  He continues twice weekly intravenous fluids.  He feels better after getting IV fluids.  He is taking Imodium and Lomotil.  He empties the ileostomy 8 or 9 times daily.  The stool is partially formed.  Objective:  Vital signs in last 24 hours:  Blood pressure (!) 128/90, pulse 69, temperature 98.1 F (36.7 C), temperature source Oral, resp. rate 18, height 5\' 11"  (1.803 m), weight 211 lb 9.6 oz (96 kg), SpO2 100 %.    HEENT: No thrush or ulcers.  The mouth is dry. Resp: Lungs clear bilaterally Cardio: Regular rate and rhythm GI: No hepatosplenomegaly, right abdomen ileostomy Vascular: No leg edema  Skin: Mild decrease in skin turgor  Portacath/PICC-without erythema  Lab Results:  Lab Results  Component Value Date   WBC 6.9 02/17/2023   HGB 13.9 02/17/2023   HCT 43.4 02/17/2023   MCV 89.3 02/17/2023   PLT 160 02/17/2023   NEUTROABS 5.4 02/17/2023    CMP  Lab Results  Component Value Date   NA 138 02/06/2023   K 4.0 02/06/2023   CL 105 02/06/2023   CO2 26 02/06/2023   GLUCOSE 113 (H) 02/06/2023   BUN 13 02/06/2023   CREATININE 1.60 (H) 02/06/2023   CALCIUM 9.5 02/06/2023   PROT 6.9 01/23/2023   ALBUMIN 3.9 01/23/2023   AST 30 01/23/2023   ALT 19 01/23/2023   ALKPHOS 158 (H) 01/23/2023   BILITOT 0.5 01/23/2023   GFRNONAA 49 (L) 02/06/2023   GFRAA 84 03/17/2008    Lab Results  Component Value Date   CEA 1.86 02/17/2023     Medications: I have reviewed the patient's current medications.   Assessment/Plan: Rectal cancer-mass at 11 cm on proctoscopy by Dr. Morton Stall 02/04/2022 Colonoscopy 12/26/2021-obstructing mass at the rectosigmoid measured at 15 cm, biopsy invasive moderate to poorly differentiated adenocarcinoma, mismatch repair protein expression intact, MSS, tumor mutation burden 4, RAS wild-type CTs 12/31/2021-masslike  circumferential thickening of the distal sigmoid colon with mildly enlarged pericolonic lymph nodes, numerous subcentimeter hypodense liver lesions, splenomegaly MRI abdomen 01/07/2022-innumerable T2 hyperintense liver lesions consistent with cysts, spleen unremarkable, no ascites or adenopathy, moderate colonic fecal retention, no bowel obstruction MRI pelvis 01/22/2022-T3cN2 tumor above and below the peritoneal reflection, tumor 11.3 cm from the anal verge and 6.1 cm from the sphincter complex, multiple enlarged perirectal nodes, 1.1 cm node versus tumor deposit at the right aspect of the tumor Diverting loop ileostomy 01/15/2022 Cycle 1 FOLFOX 02/17/2022 Cycle 2 FOLFOX 03/04/2022 Cycle 3 FOLFOX held on 03/17/2022 due to neutropenia Cycle 3 FOLFOX 03/17/2022, Udenyca Treatment held 03/31/2022 due to dehydration Cycle 4 FOLFOX 04/07/2022 Cycle 5 FOLFOX 04/22/2022, oxaliplatin dose reduced due to thrombocytopenia Cycle 6 FOLFOX 05/05/2022 Cycle 7 FOLFOX 05/19/2022 Cycle 8 FOLFOX held secondary to patient preference and toxicity (diarrhea/weight loss/neuropathy symptoms) 06/16/2022 radiation/Xeloda-07/24/2022 MRI 08/27/2022-no suspicious lymph nodes; significant reduction in size with near complete resolution of previously identified tumor deposit versus node along the right aspect of the tumor; overall reduction in bulk and signal of mucinous high rectal malignancy, mrTRG-3 10/06/2022-exploratory laparotomy and laparoscopy-carcinomatosis with low-volume peritoneal disease, dominant area adjacent to sigmoid and right lower quadrant, less than 5 mm implants in the right upper quadrant and left lower quadrant.  PCI 5 CTs 11/14/2022-decrease circumferential wall thickening in the upper rectum and decreased mesorectal lymphadenopathy, no evidence of distant metastatic disease, stable mild splenomegaly, focal airspace  opacity in the left upper lobe-likely inflammatory Gout Psoriasis Hypertension Left scalene node versus  cyst/lipoma on exam 02/03/2022-CT neck 02/10/2022 with 9.5 mm lymph node in the left supraclavicular region posterior to the sternocleidomastoid muscle.  No other prominent lymph nodes in the neck.  Biopsy 02/12/2022 compatible with benign lymph node, negative for metastatic carcinoma.       Disposition: [name] has a history of metastatic rectal cancer.  There is no clinical evidence of disease progression.  There was no measurable disease on CTs in December 2023.  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  The plan is to follow him with observation.  He will continue Imodium/Lomotil and twice weekly intravenous fluids.  We will plan for restaging CTs in June.  [name] requests a prescription for Cialis.  This has worked in the past.  He had a headache after taking Viagra. He will continue twice weekly IV fluids.  He will return for an office visit in 4 weeks. Betsy Coder, MD  02/17/2023  9:24 AM

## 2023-02-17 NOTE — Patient Instructions (Signed)

## 2023-02-18 ENCOUNTER — Other Ambulatory Visit: Payer: Self-pay | Admitting: *Deleted

## 2023-02-18 ENCOUNTER — Other Ambulatory Visit (HOSPITAL_BASED_OUTPATIENT_CLINIC_OR_DEPARTMENT_OTHER): Payer: Self-pay

## 2023-02-18 ENCOUNTER — Encounter: Payer: Self-pay | Admitting: Oncology

## 2023-02-18 MED ORDER — TADALAFIL 5 MG PO TABS
5.0000 mg | ORAL_TABLET | Freq: Every day | ORAL | 2 refills | Status: DC | PRN
  Filled 2023-02-18: qty 10, 10d supply, fill #0

## 2023-02-20 ENCOUNTER — Inpatient Hospital Stay: Payer: BC Managed Care – PPO

## 2023-02-20 VITALS — BP 126/88 | HR 68 | Temp 97.8°F | Resp 20

## 2023-02-20 DIAGNOSIS — Z95828 Presence of other vascular implants and grafts: Secondary | ICD-10-CM

## 2023-02-20 DIAGNOSIS — Z923 Personal history of irradiation: Secondary | ICD-10-CM | POA: Diagnosis not present

## 2023-02-20 DIAGNOSIS — C20 Malignant neoplasm of rectum: Secondary | ICD-10-CM

## 2023-02-20 DIAGNOSIS — E860 Dehydration: Secondary | ICD-10-CM | POA: Diagnosis not present

## 2023-02-20 DIAGNOSIS — F17210 Nicotine dependence, cigarettes, uncomplicated: Secondary | ICD-10-CM | POA: Diagnosis not present

## 2023-02-20 DIAGNOSIS — Z9221 Personal history of antineoplastic chemotherapy: Secondary | ICD-10-CM | POA: Diagnosis not present

## 2023-02-20 MED ORDER — SODIUM CHLORIDE 0.9 % IV SOLN: INTRAVENOUS | Status: AC

## 2023-02-20 MED ORDER — SODIUM CHLORIDE 0.9% FLUSH
10.0000 mL | INTRAVENOUS | Status: AC | PRN
  Administered 2023-02-20: 10 mL

## 2023-02-20 MED ORDER — HEPARIN SOD (PORK) LOCK FLUSH 100 UNIT/ML IV SOLN
500.0000 [IU] | INTRAVENOUS | Status: AC | PRN
  Administered 2023-02-20: 500 [IU]

## 2023-02-20 NOTE — Patient Instructions (Signed)

## 2023-02-23 ENCOUNTER — Other Ambulatory Visit: Payer: Self-pay | Admitting: *Deleted

## 2023-02-23 DIAGNOSIS — C20 Malignant neoplasm of rectum: Secondary | ICD-10-CM

## 2023-02-24 ENCOUNTER — Inpatient Hospital Stay: Payer: BC Managed Care – PPO

## 2023-02-24 DIAGNOSIS — C20 Malignant neoplasm of rectum: Secondary | ICD-10-CM | POA: Diagnosis not present

## 2023-02-24 DIAGNOSIS — E860 Dehydration: Secondary | ICD-10-CM | POA: Diagnosis not present

## 2023-02-24 DIAGNOSIS — F17210 Nicotine dependence, cigarettes, uncomplicated: Secondary | ICD-10-CM | POA: Diagnosis not present

## 2023-02-24 DIAGNOSIS — Z9221 Personal history of antineoplastic chemotherapy: Secondary | ICD-10-CM | POA: Diagnosis not present

## 2023-02-24 DIAGNOSIS — Z923 Personal history of irradiation: Secondary | ICD-10-CM | POA: Diagnosis not present

## 2023-02-24 MED ORDER — SODIUM CHLORIDE 0.9% FLUSH
10.0000 mL | INTRAVENOUS | Status: AC | PRN
  Administered 2023-02-24: 10 mL

## 2023-02-24 MED ORDER — SODIUM CHLORIDE 0.9 % IV SOLN: INTRAVENOUS | Status: AC

## 2023-02-24 MED ORDER — HEPARIN SOD (PORK) LOCK FLUSH 100 UNIT/ML IV SOLN
500.0000 [IU] | INTRAVENOUS | Status: AC | PRN
  Administered 2023-02-24: 500 [IU]

## 2023-02-24 NOTE — Patient Instructions (Signed)

## 2023-02-27 ENCOUNTER — Inpatient Hospital Stay: Payer: BC Managed Care – PPO

## 2023-02-27 VITALS — BP 134/89 | HR 71 | Temp 98.1°F | Resp 18

## 2023-02-27 DIAGNOSIS — Z95828 Presence of other vascular implants and grafts: Secondary | ICD-10-CM

## 2023-02-27 DIAGNOSIS — C20 Malignant neoplasm of rectum: Secondary | ICD-10-CM

## 2023-02-27 DIAGNOSIS — F17210 Nicotine dependence, cigarettes, uncomplicated: Secondary | ICD-10-CM | POA: Diagnosis not present

## 2023-02-27 DIAGNOSIS — E860 Dehydration: Secondary | ICD-10-CM | POA: Diagnosis not present

## 2023-02-27 DIAGNOSIS — Z923 Personal history of irradiation: Secondary | ICD-10-CM | POA: Diagnosis not present

## 2023-02-27 DIAGNOSIS — Z9221 Personal history of antineoplastic chemotherapy: Secondary | ICD-10-CM | POA: Diagnosis not present

## 2023-02-27 MED ORDER — SODIUM CHLORIDE 0.9 % IV SOLN: INTRAVENOUS | Status: AC

## 2023-02-27 MED ORDER — HEPARIN SOD (PORK) LOCK FLUSH 100 UNIT/ML IV SOLN
500.0000 [IU] | Freq: Once | INTRAVENOUS | Status: AC
  Administered 2023-02-27: 500 [IU] via INTRAVENOUS

## 2023-02-27 MED ORDER — SODIUM CHLORIDE 0.9% FLUSH
10.0000 mL | Freq: Once | INTRAVENOUS | Status: AC
  Administered 2023-02-27: 10 mL via INTRAVENOUS

## 2023-02-27 NOTE — Patient Instructions (Signed)

## 2023-03-02 ENCOUNTER — Other Ambulatory Visit: Payer: Self-pay | Admitting: *Deleted

## 2023-03-02 DIAGNOSIS — E871 Hypo-osmolality and hyponatremia: Secondary | ICD-10-CM

## 2023-03-03 ENCOUNTER — Inpatient Hospital Stay: Payer: BC Managed Care – PPO

## 2023-03-03 ENCOUNTER — Encounter (HOSPITAL_BASED_OUTPATIENT_CLINIC_OR_DEPARTMENT_OTHER): Payer: Self-pay | Admitting: Student

## 2023-03-03 ENCOUNTER — Ambulatory Visit (INDEPENDENT_AMBULATORY_CARE_PROVIDER_SITE_OTHER): Payer: BC Managed Care – PPO | Admitting: Student

## 2023-03-03 ENCOUNTER — Ambulatory Visit (HOSPITAL_BASED_OUTPATIENT_CLINIC_OR_DEPARTMENT_OTHER): Payer: BC Managed Care – PPO

## 2023-03-03 DIAGNOSIS — C20 Malignant neoplasm of rectum: Secondary | ICD-10-CM | POA: Diagnosis not present

## 2023-03-03 DIAGNOSIS — Z9221 Personal history of antineoplastic chemotherapy: Secondary | ICD-10-CM | POA: Diagnosis not present

## 2023-03-03 DIAGNOSIS — M25512 Pain in left shoulder: Secondary | ICD-10-CM | POA: Diagnosis not present

## 2023-03-03 DIAGNOSIS — E871 Hypo-osmolality and hyponatremia: Secondary | ICD-10-CM

## 2023-03-03 DIAGNOSIS — E860 Dehydration: Secondary | ICD-10-CM | POA: Diagnosis not present

## 2023-03-03 DIAGNOSIS — Z923 Personal history of irradiation: Secondary | ICD-10-CM | POA: Diagnosis not present

## 2023-03-03 DIAGNOSIS — F17210 Nicotine dependence, cigarettes, uncomplicated: Secondary | ICD-10-CM | POA: Diagnosis not present

## 2023-03-03 DIAGNOSIS — M19012 Primary osteoarthritis, left shoulder: Secondary | ICD-10-CM | POA: Diagnosis not present

## 2023-03-03 MED ORDER — SODIUM CHLORIDE 0.9 % IV SOLN: INTRAVENOUS | Status: DC

## 2023-03-03 MED ORDER — HEPARIN SOD (PORK) LOCK FLUSH 100 UNIT/ML IV SOLN: 500.0000 [IU] | Freq: Once | INTRAVENOUS | Status: DC

## 2023-03-03 MED ORDER — HEPARIN SOD (PORK) LOCK FLUSH 100 UNIT/ML IV SOLN
500.0000 [IU] | Freq: Once | INTRAVENOUS | Status: AC
  Administered 2023-03-03: 500 [IU] via INTRAVENOUS

## 2023-03-03 MED ORDER — SODIUM CHLORIDE 0.9% FLUSH
10.0000 mL | Freq: Once | INTRAVENOUS | Status: AC
  Administered 2023-03-03: 10 mL via INTRAVENOUS

## 2023-03-03 NOTE — Patient Instructions (Signed)

## 2023-03-03 NOTE — Progress Notes (Signed)
Chief Complaint: Left shoulder pain     History of Present Illness:    Kenneth [name] is a 62 y.o. male presenting for evaluation of pain in the front of his left shoulder.  He states that this has been ongoing for about 3 weeks and first noticed it while performing a dumbbell workout at the gym.  His pain levels initially were about a 7/10 but have been steadily improving and are now a 3/10 at the worst.  He denies any weakness says he just gets pain with certain movements and positions.  The pain is always located in the front of his shoulder and does not radiate.  He has been backing down on the amount of weight he lifts during workouts and resting it a little more.  He has also been alternating heat and ice therapy as well as trials of ibuprofen, Aspercreme, and Tiger balm.  He is right-hand dominant.   Surgical History:   None  PMH/PSH/Family History/Social History/Meds/Allergies:    Past Medical History:  Diagnosis Date   Rectal cancer 01/09/2022   Past Surgical History:  Procedure Laterality Date   ILEOSTOMY  01/15/2022   IR IMAGING GUIDED PORT INSERTION  02/12/2022   IR US GUIDE BX ASP/DRAIN  02/12/2022   Social History   Socioeconomic History   Marital status: Married    Spouse name: Not on file   Number of children: Not on file   Years of education: Not on file   Highest education level: Not on file  Occupational History   Not on file  Tobacco Use   Smoking status: Some Days   Smokeless tobacco: Never  Vaping Use   Vaping Use: Never used  Substance and Sexual Activity   Alcohol use: Not Currently    Alcohol/week: 0.0 standard drinks of alcohol   Drug use: Not Currently   Sexual activity: Not on file  Other Topics Concern   Not on file  Social History Narrative   Not on file   Social Determinants of Health   Financial Resource Strain: Low Risk  (01/06/2023)   Overall Financial Resource Strain (CARDIA)    Difficulty  of Paying Living Expenses: Not hard at all  Food Insecurity: No Food Insecurity (01/06/2023)   Hunger Vital Sign    Worried About Running Out of Food in the Last Year: Never true    Ran Out of Food in the Last Year: Never true  Transportation Needs: No Transportation Needs (01/06/2023)   PRAPARE - Administrator, Civil Service (Medical): No    Lack of Transportation (Non-Medical): No  Physical Activity: Not on file  Stress: No Stress Concern Present (01/06/2023)   [employer] of Occupational Health - Occupational Stress Questionnaire    Feeling of Stress : Not at all  Social Connections: Not on file   Family History  Problem Relation Age of Onset   Breast cancer Mother    Leukemia Mother    Pancreatic cancer Father    No Active Allergies Current Outpatient Medications  Medication Sig Dispense Refill   tadalafil (CIALIS) 5 MG tablet Take 1 tablet (5 mg total) by mouth daily as needed for erectile dysfunction. 10 tablet 2   acetaminophen (TYLENOL) 325 MG tablet Take by mouth. Tylenol PM for Sleep     albuterol (  PROVENTIL HFA) 108 (90 Base) MCG/ACT inhaler Inhale 2 puffs into the lungs every 6 (six) hours as needed for wheezing or shortness of breath. (Patient not taking: Reported on 02/17/2023) 6.7 g 3   Cholecalciferol (VITAMIN D3) 125 MCG (5000 UT) CAPS Take 1 capsule by mouth daily at 6 (six) AM. 2 daily     diphenoxylate-atropine (LOMOTIL) 2.5-0.025 MG tablet Take 2 tablets by mouth 4 (four) times daily as needed. 240 tablet 2   ferrous sulfate 325 (65 FE) MG tablet Take 325 mg by mouth daily with breakfast.     lidocaine-prilocaine (EMLA) cream Apply to the affected area as needed. 30 g 5   loperamide (IMODIUM) 2 MG capsule Take 4 mg by mouth as needed for diarrhea or loose stools.     melatonin (MELATONIN MAXIMUM STRENGTH) 5 MG TABS Take by mouth.     potassium chloride SA (KLOR-CON M) 20 MEQ tablet Take 1 tablet (20 mEq total) by mouth daily. 30 tablet 3   No  current facility-administered medications for this visit.   Facility-Administered Medications Ordered in Other Visits  Medication Dose Route Frequency Provider Last Rate Last Admin   [START ON 03/06/2023] 0.9 %  sodium chloride infusion   Intravenous Continuous [name], MD   Stopped at 03/03/23 1254   heparin lock flush 100 unit/mL  500 Units Intravenous Once Rana Snare, NP       No results found.  Review of Systems:   A ROS was performed including pertinent positives and negatives as documented in the HPI.  Physical Exam :   Constitutional: NAD and appears stated age Neurological: Alert and oriented Psych: Appropriate affect and cooperative There were no vitals taken for this visit.   Comprehensive Musculoskeletal Exam:    Patient has some tenderness to palpation in the anterior shoulder around the bicipital groove.  No other tenderness throughout.  Passive and active range of motion to 150 degrees forward flexion, 45 degrees external rotation, and internal rotation to T12 bilaterally.  Some discomfort only noted with internal rotation.  Negative Neer, empty can, O'Briens, and speeds test.  Imaging:   Xray (left shoulder 3 views): Negative    I personally reviewed and interpreted the radiographs.   Assessment:   62 y.o. male presenting with anterior left shoulder pain.  Overall his symptoms have been steadily improving and he has no focal weakness on exam however patient is a weightlifter and has very good strength at baseline.  Based on the location of his pain I suspect that this is most likely biceps tendinitis however unable to rule out other pathology such as a rotator cuff tear.  Discussed with patient that I think it is fine to continue exercising and weightlifting as tolerated by pain and would like to assess if over the next couple weeks this continues to resolve.  Discussed that if he continues to have pain or anything worsens in 3 to 4 weeks to follow-up for  reevaluation.  Continue anti-inflammatories and ice/heat as needed.  Plan :    -Return to clinic in 3 to 4 weeks if symptoms have not resolved     I personally saw and evaluated the patient, and participated in the management and treatment plan.  Hazle Nordmann, PA-C Orthopedics  This document was dictated using Conservation officer, historic buildings. A reasonable attempt at proof reading has been made to minimize errors.

## 2023-03-06 ENCOUNTER — Inpatient Hospital Stay: Payer: BC Managed Care – PPO

## 2023-03-06 DIAGNOSIS — Z923 Personal history of irradiation: Secondary | ICD-10-CM | POA: Diagnosis not present

## 2023-03-06 DIAGNOSIS — E871 Hypo-osmolality and hyponatremia: Secondary | ICD-10-CM

## 2023-03-06 DIAGNOSIS — Z9221 Personal history of antineoplastic chemotherapy: Secondary | ICD-10-CM | POA: Diagnosis not present

## 2023-03-06 DIAGNOSIS — C20 Malignant neoplasm of rectum: Secondary | ICD-10-CM | POA: Diagnosis not present

## 2023-03-06 DIAGNOSIS — F17210 Nicotine dependence, cigarettes, uncomplicated: Secondary | ICD-10-CM | POA: Diagnosis not present

## 2023-03-06 DIAGNOSIS — E860 Dehydration: Secondary | ICD-10-CM | POA: Diagnosis not present

## 2023-03-06 MED ORDER — HEPARIN SOD (PORK) LOCK FLUSH 100 UNIT/ML IV SOLN
500.0000 [IU] | INTRAVENOUS | Status: AC | PRN
  Administered 2023-03-06: 500 [IU]

## 2023-03-06 MED ORDER — SODIUM CHLORIDE 0.9 % IV SOLN: INTRAVENOUS | Status: AC

## 2023-03-06 MED ORDER — SODIUM CHLORIDE 0.9% FLUSH
10.0000 mL | INTRAVENOUS | Status: AC | PRN
  Administered 2023-03-06: 10 mL

## 2023-03-06 NOTE — Patient Instructions (Signed)

## 2023-03-09 ENCOUNTER — Other Ambulatory Visit: Payer: Self-pay | Admitting: *Deleted

## 2023-03-09 DIAGNOSIS — C20 Malignant neoplasm of rectum: Secondary | ICD-10-CM

## 2023-03-10 ENCOUNTER — Inpatient Hospital Stay: Payer: BC Managed Care – PPO

## 2023-03-10 VITALS — BP 124/85 | HR 68 | Temp 98.5°F | Resp 18 | Ht 71.0 in | Wt 210.3 lb

## 2023-03-10 DIAGNOSIS — F17210 Nicotine dependence, cigarettes, uncomplicated: Secondary | ICD-10-CM | POA: Diagnosis not present

## 2023-03-10 DIAGNOSIS — Z923 Personal history of irradiation: Secondary | ICD-10-CM | POA: Diagnosis not present

## 2023-03-10 DIAGNOSIS — Z9221 Personal history of antineoplastic chemotherapy: Secondary | ICD-10-CM | POA: Diagnosis not present

## 2023-03-10 DIAGNOSIS — C20 Malignant neoplasm of rectum: Secondary | ICD-10-CM

## 2023-03-10 DIAGNOSIS — E860 Dehydration: Secondary | ICD-10-CM | POA: Diagnosis not present

## 2023-03-10 DIAGNOSIS — Z95828 Presence of other vascular implants and grafts: Secondary | ICD-10-CM

## 2023-03-10 MED ORDER — SODIUM CHLORIDE 0.9% FLUSH
10.0000 mL | INTRAVENOUS | Status: AC | PRN
  Administered 2023-03-10: 10 mL

## 2023-03-10 MED ORDER — SODIUM CHLORIDE 0.9 % IV SOLN: INTRAVENOUS | Status: AC

## 2023-03-10 MED ORDER — HEPARIN SOD (PORK) LOCK FLUSH 100 UNIT/ML IV SOLN
500.0000 [IU] | INTRAVENOUS | Status: AC | PRN
  Administered 2023-03-10: 500 [IU]

## 2023-03-10 NOTE — Patient Instructions (Signed)

## 2023-03-12 DIAGNOSIS — Z932 Ileostomy status: Secondary | ICD-10-CM | POA: Diagnosis not present

## 2023-03-13 ENCOUNTER — Inpatient Hospital Stay: Payer: BC Managed Care – PPO

## 2023-03-13 VITALS — BP 134/88 | HR 67 | Temp 97.6°F | Resp 18

## 2023-03-13 DIAGNOSIS — C20 Malignant neoplasm of rectum: Secondary | ICD-10-CM

## 2023-03-13 DIAGNOSIS — F17210 Nicotine dependence, cigarettes, uncomplicated: Secondary | ICD-10-CM | POA: Diagnosis not present

## 2023-03-13 DIAGNOSIS — Z95828 Presence of other vascular implants and grafts: Secondary | ICD-10-CM

## 2023-03-13 DIAGNOSIS — Z923 Personal history of irradiation: Secondary | ICD-10-CM | POA: Diagnosis not present

## 2023-03-13 DIAGNOSIS — E860 Dehydration: Secondary | ICD-10-CM | POA: Diagnosis not present

## 2023-03-13 DIAGNOSIS — Z9221 Personal history of antineoplastic chemotherapy: Secondary | ICD-10-CM | POA: Diagnosis not present

## 2023-03-13 MED ORDER — HEPARIN SOD (PORK) LOCK FLUSH 100 UNIT/ML IV SOLN
500.0000 [IU] | INTRAVENOUS | Status: AC | PRN
  Administered 2023-03-13: 500 [IU]

## 2023-03-13 MED ORDER — SODIUM CHLORIDE 0.9% FLUSH
10.0000 mL | INTRAVENOUS | Status: AC | PRN
  Administered 2023-03-13: 10 mL

## 2023-03-13 MED ORDER — SODIUM CHLORIDE 0.9 % IV SOLN: INTRAVENOUS | Status: AC

## 2023-03-13 NOTE — Patient Instructions (Signed)

## 2023-03-16 ENCOUNTER — Other Ambulatory Visit: Payer: Self-pay | Admitting: *Deleted

## 2023-03-16 DIAGNOSIS — C20 Malignant neoplasm of rectum: Secondary | ICD-10-CM

## 2023-03-17 ENCOUNTER — Telehealth: Payer: Self-pay | Admitting: Oncology

## 2023-03-17 ENCOUNTER — Inpatient Hospital Stay: Payer: BC Managed Care – PPO

## 2023-03-17 ENCOUNTER — Inpatient Hospital Stay: Payer: BC Managed Care – PPO | Admitting: Oncology

## 2023-03-17 VITALS — BP 133/85 | HR 69 | Temp 98.2°F | Resp 18 | Ht 71.0 in | Wt 207.4 lb

## 2023-03-17 VITALS — BP 128/91 | HR 64 | Resp 18

## 2023-03-17 DIAGNOSIS — C20 Malignant neoplasm of rectum: Secondary | ICD-10-CM

## 2023-03-17 DIAGNOSIS — Z95828 Presence of other vascular implants and grafts: Secondary | ICD-10-CM

## 2023-03-17 DIAGNOSIS — E860 Dehydration: Secondary | ICD-10-CM | POA: Diagnosis not present

## 2023-03-17 DIAGNOSIS — Z923 Personal history of irradiation: Secondary | ICD-10-CM | POA: Diagnosis not present

## 2023-03-17 DIAGNOSIS — F17210 Nicotine dependence, cigarettes, uncomplicated: Secondary | ICD-10-CM | POA: Diagnosis not present

## 2023-03-17 DIAGNOSIS — Z9221 Personal history of antineoplastic chemotherapy: Secondary | ICD-10-CM | POA: Diagnosis not present

## 2023-03-17 LAB — CBC WITH DIFFERENTIAL (CANCER CENTER ONLY)
Abs Immature Granulocytes: 0.01 10*3/uL (ref 0.00–0.07)
Basophils Absolute: 0 10*3/uL (ref 0.0–0.1)
Basophils Relative: 1 %
Eosinophils Absolute: 0.2 10*3/uL (ref 0.0–0.5)
Eosinophils Relative: 4 %
HCT: 45.5 % (ref 39.0–52.0)
Hemoglobin: 14.7 g/dL (ref 13.0–17.0)
Immature Granulocytes: 0 %
Lymphocytes Relative: 17 %
Lymphs Abs: 0.9 10*3/uL (ref 0.7–4.0)
MCH: 27.8 pg (ref 26.0–34.0)
MCHC: 32.3 g/dL (ref 30.0–36.0)
MCV: 86.2 fL (ref 80.0–100.0)
Monocytes Absolute: 0.5 10*3/uL (ref 0.1–1.0)
Monocytes Relative: 8 %
Neutro Abs: 3.8 10*3/uL (ref 1.7–7.7)
Neutrophils Relative %: 70 %
Platelet Count: 202 10*3/uL (ref 150–400)
RBC: 5.28 MIL/uL (ref 4.22–5.81)
RDW: 14.6 % (ref 11.5–15.5)
WBC Count: 5.4 10*3/uL (ref 4.0–10.5)
nRBC: 0 % (ref 0.0–0.2)

## 2023-03-17 LAB — CMP (CANCER CENTER ONLY)
ALT: 21 U/L (ref 0–44)
AST: 30 U/L (ref 15–41)
Albumin: 4.1 g/dL (ref 3.5–5.0)
Alkaline Phosphatase: 159 U/L — ABNORMAL HIGH (ref 38–126)
Anion gap: 8 (ref 5–15)
BUN: 19 mg/dL (ref 8–23)
CO2: 27 mmol/L (ref 22–32)
Calcium: 9.1 mg/dL (ref 8.9–10.3)
Chloride: 101 mmol/L (ref 98–111)
Creatinine: 1.63 mg/dL — ABNORMAL HIGH (ref 0.61–1.24)
GFR, Estimated: 48 mL/min — ABNORMAL LOW (ref >60)
Glucose, Bld: 127 mg/dL — ABNORMAL HIGH (ref 70–99)
Potassium: 3.7 mmol/L (ref 3.5–5.1)
Sodium: 136 mmol/L (ref 135–145)
Total Bilirubin: 0.6 mg/dL (ref 0.3–1.2)
Total Protein: 6.8 g/dL (ref 6.5–8.1)

## 2023-03-17 LAB — MAGNESIUM: Magnesium: 1.9 mg/dL (ref 1.7–2.4)

## 2023-03-17 LAB — CEA (ACCESS): CEA (CHCC): 2.29 ng/mL (ref 0.00–5.00)

## 2023-03-17 MED ORDER — HEPARIN SOD (PORK) LOCK FLUSH 100 UNIT/ML IV SOLN
500.0000 [IU] | INTRAVENOUS | Status: AC | PRN
  Administered 2023-03-17: 500 [IU]

## 2023-03-17 MED ORDER — SODIUM CHLORIDE 0.9 % IV SOLN: INTRAVENOUS | Status: AC

## 2023-03-17 MED ORDER — SODIUM CHLORIDE 0.9% FLUSH
10.0000 mL | INTRAVENOUS | Status: AC | PRN
  Administered 2023-03-17: 10 mL

## 2023-03-17 NOTE — Progress Notes (Signed)
[facility] OFFICE PROGRESS NOTE   Diagnosis: Rectal cancer  INTERVAL HISTORY:   [name] returns as scheduled.  He continues to receive IV fluids twice weekly.  He feels better after receiving IV fluids.  He has increased his fluid intake.  He continues Imodium and Lomotil.  He had a bowel movement after eating Pasta.  He is exercising.  He saw orthopedics for left shoulder pain.  He has tenderness and pain at the lower medial aspect of the shoulder joint/upper arm.  This has improved.  Objective:  Vital signs in last 24 hours:  Blood pressure 133/85, pulse 69, temperature 98.2 F (36.8 C), temperature source Oral, resp. rate 18, height 5\' 11"  (1.803 m), weight 207 lb 6.4 oz (94.1 kg), SpO2 100 %.    HEENT: The mucous membranes are moist, no thrush Resp: Clear bilaterally Cardio: Regular rate and rhythm GI: No hepatosplenomegaly, right abdomen ileostomy, nontender, no mass Vascular: No leg edema  Portacath/PICC-without erythema  Lab Results:  Lab Results  Component Value Date   WBC 5.4 03/17/2023   HGB 14.7 03/17/2023   HCT 45.5 03/17/2023   MCV 86.2 03/17/2023   PLT 202 03/17/2023   NEUTROABS 3.8 03/17/2023    CMP  Lab Results  Component Value Date   NA 136 03/17/2023   K 3.7 03/17/2023   CL 101 03/17/2023   CO2 27 03/17/2023   GLUCOSE 127 (H) 03/17/2023   BUN 19 03/17/2023   CREATININE 1.63 (H) 03/17/2023   CALCIUM 9.1 03/17/2023   PROT 6.8 03/17/2023   ALBUMIN 4.1 03/17/2023   AST 30 03/17/2023   ALT 21 03/17/2023   ALKPHOS 159 (H) 03/17/2023   BILITOT 0.6 03/17/2023   GFRNONAA 48 (L) 03/17/2023   GFRAA 84 03/17/2008    Lab Results  Component Value Date   CEA 2.29 03/17/2023    Medications: I have reviewed the patient's current medications.   Assessment/Plan: Rectal cancer-mass at 11 cm on proctoscopy by Dr. Byrd Hesselbach 02/04/2022 Colonoscopy 12/26/2021-obstructing mass at the rectosigmoid measured at 15 cm, biopsy invasive moderate  to poorly differentiated adenocarcinoma, mismatch repair protein expression intact, MSS, tumor mutation burden 4, RAS wild-type CTs 12/31/2021-masslike circumferential thickening of the distal sigmoid colon with mildly enlarged pericolonic lymph nodes, numerous subcentimeter hypodense liver lesions, splenomegaly MRI abdomen 01/07/2022-innumerable T2 hyperintense liver lesions consistent with cysts, spleen unremarkable, no ascites or adenopathy, moderate colonic fecal retention, no bowel obstruction MRI pelvis 01/22/2022-T3cN2 tumor above and below the peritoneal reflection, tumor 11.3 cm from the anal verge and 6.1 cm from the sphincter complex, multiple enlarged perirectal nodes, 1.1 cm node versus tumor deposit at the right aspect of the tumor Diverting loop ileostomy 01/15/2022 Cycle 1 FOLFOX 02/17/2022 Cycle 2 FOLFOX 03/04/2022 Cycle 3 FOLFOX held on 03/17/2022 due to neutropenia Cycle 3 FOLFOX 03/17/2022, Udenyca Treatment held 03/31/2022 due to dehydration Cycle 4 FOLFOX 04/07/2022 Cycle 5 FOLFOX 04/22/2022, oxaliplatin dose reduced due to thrombocytopenia Cycle 6 FOLFOX 05/05/2022 Cycle 7 FOLFOX 05/19/2022 Cycle 8 FOLFOX held secondary to patient preference and toxicity (diarrhea/weight loss/neuropathy symptoms) 06/16/2022 radiation/Xeloda-07/24/2022 MRI 08/27/2022-no suspicious lymph nodes; significant reduction in size with near complete resolution of previously identified tumor deposit versus node along the right aspect of the tumor; overall reduction in bulk and signal of mucinous high rectal malignancy, mrTRG-3 10/06/2022-exploratory laparotomy and laparoscopy-carcinomatosis with low-volume peritoneal disease, dominant area adjacent to sigmoid and right lower quadrant, less than 5 mm implants in the right upper quadrant and left lower quadrant.  PCI 5  CTs 11/14/2022-decrease circumferential wall thickening in the upper rectum and decreased mesorectal lymphadenopathy, no evidence of distant metastatic disease,  stable mild splenomegaly, focal airspace opacity in the left upper lobe-likely inflammatory Gout Psoriasis Hypertension Left scalene node versus cyst/lipoma on exam 02/03/2022-CT neck 02/10/2022 with 9.5 mm lymph node in the left supraclavicular region posterior to the sternocleidomastoid muscle.  No other prominent lymph nodes in the neck.  Biopsy 02/12/2022 compatible with benign lymph node, negative for metastatic carcinoma.    Disposition: [name] appears unchanged.  There is no clinical evidence for progression of the metastatic rectal cancer.  We will plan for a restaging CT evaluation in June or July.  He will continue to receive twice weekly IV fluids at the Cancer center.  He will return for an office visit in 4 weeks.  He will follow-up with orthopedics if the left shoulder pain persists.  Thornton Papas, MD  03/17/2023  10:41 AM

## 2023-03-17 NOTE — Patient Instructions (Signed)

## 2023-03-18 ENCOUNTER — Other Ambulatory Visit: Payer: Self-pay | Admitting: *Deleted

## 2023-03-18 NOTE — Progress Notes (Signed)
Kenneth [name] has added several new supplements to his medication regimen and these are being documented today on med list. He did make Dr. [name] aware at visit on 03/17/23.

## 2023-03-20 ENCOUNTER — Other Ambulatory Visit: Payer: BC Managed Care – PPO

## 2023-03-20 ENCOUNTER — Inpatient Hospital Stay: Payer: BC Managed Care – PPO | Attending: Oncology

## 2023-03-20 VITALS — BP 127/91 | HR 70 | Temp 98.1°F | Resp 18

## 2023-03-20 DIAGNOSIS — M109 Gout, unspecified: Secondary | ICD-10-CM | POA: Diagnosis not present

## 2023-03-20 DIAGNOSIS — Z9221 Personal history of antineoplastic chemotherapy: Secondary | ICD-10-CM | POA: Diagnosis not present

## 2023-03-20 DIAGNOSIS — E860 Dehydration: Secondary | ICD-10-CM | POA: Insufficient documentation

## 2023-03-20 DIAGNOSIS — Z95828 Presence of other vascular implants and grafts: Secondary | ICD-10-CM

## 2023-03-20 DIAGNOSIS — Z923 Personal history of irradiation: Secondary | ICD-10-CM | POA: Diagnosis not present

## 2023-03-20 DIAGNOSIS — I10 Essential (primary) hypertension: Secondary | ICD-10-CM | POA: Insufficient documentation

## 2023-03-20 DIAGNOSIS — C20 Malignant neoplasm of rectum: Secondary | ICD-10-CM | POA: Insufficient documentation

## 2023-03-20 DIAGNOSIS — Z932 Ileostomy status: Secondary | ICD-10-CM | POA: Diagnosis not present

## 2023-03-20 MED ORDER — SODIUM CHLORIDE 0.9 % IV SOLN: Freq: Once | INTRAVENOUS | Status: AC

## 2023-03-20 MED ORDER — SODIUM CHLORIDE 0.9% FLUSH
10.0000 mL | INTRAVENOUS | Status: DC | PRN
  Administered 2023-03-20: 10 mL via INTRAVENOUS

## 2023-03-20 MED ORDER — HEPARIN SOD (PORK) LOCK FLUSH 100 UNIT/ML IV SOLN
500.0000 [IU] | Freq: Once | INTRAVENOUS | Status: AC
  Administered 2023-03-20: 500 [IU] via INTRAVENOUS

## 2023-03-20 NOTE — Patient Instructions (Signed)

## 2023-03-23 ENCOUNTER — Other Ambulatory Visit: Payer: Self-pay | Admitting: *Deleted

## 2023-03-23 DIAGNOSIS — C20 Malignant neoplasm of rectum: Secondary | ICD-10-CM

## 2023-03-24 ENCOUNTER — Inpatient Hospital Stay: Payer: BC Managed Care – PPO

## 2023-03-24 VITALS — BP 129/86 | HR 65 | Temp 98.1°F | Resp 18 | Ht 71.0 in | Wt 210.0 lb

## 2023-03-24 DIAGNOSIS — Z9221 Personal history of antineoplastic chemotherapy: Secondary | ICD-10-CM | POA: Diagnosis not present

## 2023-03-24 DIAGNOSIS — C20 Malignant neoplasm of rectum: Secondary | ICD-10-CM | POA: Diagnosis not present

## 2023-03-24 DIAGNOSIS — E860 Dehydration: Secondary | ICD-10-CM | POA: Diagnosis not present

## 2023-03-24 DIAGNOSIS — M109 Gout, unspecified: Secondary | ICD-10-CM | POA: Diagnosis not present

## 2023-03-24 DIAGNOSIS — Z923 Personal history of irradiation: Secondary | ICD-10-CM | POA: Diagnosis not present

## 2023-03-24 DIAGNOSIS — I10 Essential (primary) hypertension: Secondary | ICD-10-CM | POA: Diagnosis not present

## 2023-03-24 MED ORDER — SODIUM CHLORIDE 0.9% FLUSH
10.0000 mL | Freq: Once | INTRAVENOUS | Status: AC
  Administered 2023-03-24: 10 mL via INTRAVENOUS

## 2023-03-24 MED ORDER — HEPARIN SOD (PORK) LOCK FLUSH 100 UNIT/ML IV SOLN
500.0000 [IU] | Freq: Once | INTRAVENOUS | Status: AC
  Administered 2023-03-24: 500 [IU] via INTRAVENOUS

## 2023-03-24 MED ORDER — SODIUM CHLORIDE 0.9 % IV SOLN: INTRAVENOUS | Status: AC

## 2023-03-24 NOTE — Patient Instructions (Signed)

## 2023-03-27 ENCOUNTER — Inpatient Hospital Stay: Payer: BC Managed Care – PPO

## 2023-03-27 DIAGNOSIS — Z923 Personal history of irradiation: Secondary | ICD-10-CM | POA: Diagnosis not present

## 2023-03-27 DIAGNOSIS — E860 Dehydration: Secondary | ICD-10-CM | POA: Diagnosis not present

## 2023-03-27 DIAGNOSIS — Z9221 Personal history of antineoplastic chemotherapy: Secondary | ICD-10-CM | POA: Diagnosis not present

## 2023-03-27 DIAGNOSIS — I10 Essential (primary) hypertension: Secondary | ICD-10-CM | POA: Diagnosis not present

## 2023-03-27 DIAGNOSIS — C20 Malignant neoplasm of rectum: Secondary | ICD-10-CM

## 2023-03-27 DIAGNOSIS — M109 Gout, unspecified: Secondary | ICD-10-CM | POA: Diagnosis not present

## 2023-03-27 MED ORDER — HEPARIN SOD (PORK) LOCK FLUSH 100 UNIT/ML IV SOLN
500.0000 [IU] | Freq: Once | INTRAVENOUS | Status: AC
  Administered 2023-03-27: 500 [IU] via INTRAVENOUS

## 2023-03-27 MED ORDER — SODIUM CHLORIDE 0.9% FLUSH
10.0000 mL | Freq: Once | INTRAVENOUS | Status: AC
  Administered 2023-03-27: 10 mL via INTRAVENOUS

## 2023-03-27 MED ORDER — SODIUM CHLORIDE 0.9 % IV SOLN: INTRAVENOUS | Status: AC

## 2023-03-27 NOTE — Patient Instructions (Signed)

## 2023-03-30 ENCOUNTER — Other Ambulatory Visit (HOSPITAL_BASED_OUTPATIENT_CLINIC_OR_DEPARTMENT_OTHER): Payer: Self-pay

## 2023-03-30 ENCOUNTER — Other Ambulatory Visit: Payer: Self-pay

## 2023-03-30 ENCOUNTER — Other Ambulatory Visit: Payer: Self-pay | Admitting: *Deleted

## 2023-03-30 DIAGNOSIS — R198 Other specified symptoms and signs involving the digestive system and abdomen: Secondary | ICD-10-CM

## 2023-03-31 ENCOUNTER — Inpatient Hospital Stay: Payer: BC Managed Care – PPO

## 2023-03-31 ENCOUNTER — Other Ambulatory Visit (HOSPITAL_BASED_OUTPATIENT_CLINIC_OR_DEPARTMENT_OTHER): Payer: Self-pay

## 2023-03-31 DIAGNOSIS — E860 Dehydration: Secondary | ICD-10-CM | POA: Diagnosis not present

## 2023-03-31 DIAGNOSIS — M109 Gout, unspecified: Secondary | ICD-10-CM | POA: Diagnosis not present

## 2023-03-31 DIAGNOSIS — I10 Essential (primary) hypertension: Secondary | ICD-10-CM | POA: Diagnosis not present

## 2023-03-31 DIAGNOSIS — Z9221 Personal history of antineoplastic chemotherapy: Secondary | ICD-10-CM | POA: Diagnosis not present

## 2023-03-31 DIAGNOSIS — R198 Other specified symptoms and signs involving the digestive system and abdomen: Secondary | ICD-10-CM

## 2023-03-31 DIAGNOSIS — Z923 Personal history of irradiation: Secondary | ICD-10-CM | POA: Diagnosis not present

## 2023-03-31 DIAGNOSIS — C20 Malignant neoplasm of rectum: Secondary | ICD-10-CM | POA: Diagnosis not present

## 2023-03-31 LAB — CBC WITH DIFFERENTIAL (CANCER CENTER ONLY)
Abs Immature Granulocytes: 0.02 10*3/uL (ref 0.00–0.07)
Basophils Absolute: 0.1 10*3/uL (ref 0.0–0.1)
Basophils Relative: 1 %
Eosinophils Absolute: 0.3 10*3/uL (ref 0.0–0.5)
Eosinophils Relative: 4 %
HCT: 45.3 % (ref 39.0–52.0)
Hemoglobin: 14.8 g/dL (ref 13.0–17.0)
Immature Granulocytes: 0 %
Lymphocytes Relative: 10 %
Lymphs Abs: 0.8 10*3/uL (ref 0.7–4.0)
MCH: 28 pg (ref 26.0–34.0)
MCHC: 32.7 g/dL (ref 30.0–36.0)
MCV: 85.8 fL (ref 80.0–100.0)
Monocytes Absolute: 0.5 10*3/uL (ref 0.1–1.0)
Monocytes Relative: 6 %
Neutro Abs: 5.9 10*3/uL (ref 1.7–7.7)
Neutrophils Relative %: 79 %
Platelet Count: 168 10*3/uL (ref 150–400)
RBC: 5.28 MIL/uL (ref 4.22–5.81)
RDW: 15.3 % (ref 11.5–15.5)
WBC Count: 7.6 10*3/uL (ref 4.0–10.5)
nRBC: 0 % (ref 0.0–0.2)

## 2023-03-31 LAB — CMP (CANCER CENTER ONLY)
ALT: 18 U/L (ref 0–44)
AST: 27 U/L (ref 15–41)
Albumin: 3.9 g/dL (ref 3.5–5.0)
Alkaline Phosphatase: 144 U/L — ABNORMAL HIGH (ref 38–126)
Anion gap: 8 (ref 5–15)
BUN: 16 mg/dL (ref 8–23)
CO2: 28 mmol/L (ref 22–32)
Calcium: 9.2 mg/dL (ref 8.9–10.3)
Chloride: 101 mmol/L (ref 98–111)
Creatinine: 1.65 mg/dL — ABNORMAL HIGH (ref 0.61–1.24)
GFR, Estimated: 47 mL/min — ABNORMAL LOW (ref >60)
Glucose, Bld: 115 mg/dL — ABNORMAL HIGH (ref 70–99)
Potassium: 3.8 mmol/L (ref 3.5–5.1)
Sodium: 137 mmol/L (ref 135–145)
Total Bilirubin: 0.6 mg/dL (ref 0.3–1.2)
Total Protein: 6.7 g/dL (ref 6.5–8.1)

## 2023-03-31 LAB — MAGNESIUM: Magnesium: 1.9 mg/dL (ref 1.7–2.4)

## 2023-03-31 MED ORDER — SODIUM CHLORIDE 0.9 % IV SOLN: INTRAVENOUS | Status: AC

## 2023-03-31 MED ORDER — HEPARIN SOD (PORK) LOCK FLUSH 100 UNIT/ML IV SOLN
500.0000 [IU] | INTRAVENOUS | Status: AC | PRN
  Administered 2023-03-31: 500 [IU]

## 2023-03-31 MED ORDER — SODIUM CHLORIDE 0.9% FLUSH
10.0000 mL | INTRAVENOUS | Status: AC | PRN
  Administered 2023-03-31: 10 mL

## 2023-03-31 NOTE — Patient Instructions (Signed)

## 2023-04-03 ENCOUNTER — Inpatient Hospital Stay: Payer: BC Managed Care – PPO

## 2023-04-03 DIAGNOSIS — C20 Malignant neoplasm of rectum: Secondary | ICD-10-CM | POA: Diagnosis not present

## 2023-04-03 DIAGNOSIS — I10 Essential (primary) hypertension: Secondary | ICD-10-CM | POA: Diagnosis not present

## 2023-04-03 DIAGNOSIS — E860 Dehydration: Secondary | ICD-10-CM | POA: Diagnosis not present

## 2023-04-03 DIAGNOSIS — Z9221 Personal history of antineoplastic chemotherapy: Secondary | ICD-10-CM | POA: Diagnosis not present

## 2023-04-03 DIAGNOSIS — Z923 Personal history of irradiation: Secondary | ICD-10-CM | POA: Diagnosis not present

## 2023-04-03 DIAGNOSIS — M109 Gout, unspecified: Secondary | ICD-10-CM | POA: Diagnosis not present

## 2023-04-03 DIAGNOSIS — R198 Other specified symptoms and signs involving the digestive system and abdomen: Secondary | ICD-10-CM

## 2023-04-03 MED ORDER — SODIUM CHLORIDE 0.9% FLUSH
10.0000 mL | INTRAVENOUS | Status: AC | PRN
  Administered 2023-04-03: 10 mL

## 2023-04-03 MED ORDER — SODIUM CHLORIDE 0.9 % IV SOLN: INTRAVENOUS | Status: AC

## 2023-04-03 MED ORDER — HEPARIN SOD (PORK) LOCK FLUSH 100 UNIT/ML IV SOLN
500.0000 [IU] | INTRAVENOUS | Status: AC | PRN
  Administered 2023-04-03: 500 [IU]

## 2023-04-03 NOTE — Patient Instructions (Signed)

## 2023-04-06 ENCOUNTER — Other Ambulatory Visit: Payer: Self-pay

## 2023-04-06 DIAGNOSIS — R198 Other specified symptoms and signs involving the digestive system and abdomen: Secondary | ICD-10-CM

## 2023-04-06 MED ORDER — SODIUM CHLORIDE 0.9 % IV SOLN: INTRAVENOUS | Status: DC

## 2023-04-07 ENCOUNTER — Inpatient Hospital Stay: Payer: BC Managed Care – PPO

## 2023-04-07 DIAGNOSIS — E860 Dehydration: Secondary | ICD-10-CM | POA: Diagnosis not present

## 2023-04-07 DIAGNOSIS — Z923 Personal history of irradiation: Secondary | ICD-10-CM | POA: Diagnosis not present

## 2023-04-07 DIAGNOSIS — R198 Other specified symptoms and signs involving the digestive system and abdomen: Secondary | ICD-10-CM

## 2023-04-07 DIAGNOSIS — C20 Malignant neoplasm of rectum: Secondary | ICD-10-CM | POA: Diagnosis not present

## 2023-04-07 DIAGNOSIS — Z9221 Personal history of antineoplastic chemotherapy: Secondary | ICD-10-CM | POA: Diagnosis not present

## 2023-04-07 DIAGNOSIS — M109 Gout, unspecified: Secondary | ICD-10-CM | POA: Diagnosis not present

## 2023-04-07 DIAGNOSIS — I10 Essential (primary) hypertension: Secondary | ICD-10-CM | POA: Diagnosis not present

## 2023-04-07 MED ORDER — HEPARIN SOD (PORK) LOCK FLUSH 100 UNIT/ML IV SOLN
500.0000 [IU] | INTRAVENOUS | Status: AC | PRN
  Administered 2023-04-07: 500 [IU]

## 2023-04-07 MED ORDER — SODIUM CHLORIDE 0.9% FLUSH
10.0000 mL | INTRAVENOUS | Status: AC | PRN
  Administered 2023-04-07: 10 mL

## 2023-04-07 MED ORDER — SODIUM CHLORIDE 0.9 % IV SOLN: INTRAVENOUS | Status: DC

## 2023-04-07 NOTE — Patient Instructions (Signed)

## 2023-04-10 ENCOUNTER — Inpatient Hospital Stay: Payer: BC Managed Care – PPO | Admitting: Nurse Practitioner

## 2023-04-10 ENCOUNTER — Other Ambulatory Visit (HOSPITAL_BASED_OUTPATIENT_CLINIC_OR_DEPARTMENT_OTHER): Payer: Self-pay

## 2023-04-10 ENCOUNTER — Other Ambulatory Visit: Payer: Self-pay

## 2023-04-10 ENCOUNTER — Inpatient Hospital Stay: Payer: BC Managed Care – PPO

## 2023-04-10 ENCOUNTER — Encounter: Payer: Self-pay | Admitting: Nurse Practitioner

## 2023-04-10 DIAGNOSIS — Z923 Personal history of irradiation: Secondary | ICD-10-CM | POA: Diagnosis not present

## 2023-04-10 DIAGNOSIS — Z9221 Personal history of antineoplastic chemotherapy: Secondary | ICD-10-CM | POA: Diagnosis not present

## 2023-04-10 DIAGNOSIS — C20 Malignant neoplasm of rectum: Secondary | ICD-10-CM

## 2023-04-10 DIAGNOSIS — M109 Gout, unspecified: Secondary | ICD-10-CM | POA: Diagnosis not present

## 2023-04-10 DIAGNOSIS — E860 Dehydration: Secondary | ICD-10-CM | POA: Diagnosis not present

## 2023-04-10 DIAGNOSIS — Z95828 Presence of other vascular implants and grafts: Secondary | ICD-10-CM

## 2023-04-10 DIAGNOSIS — I10 Essential (primary) hypertension: Secondary | ICD-10-CM | POA: Diagnosis not present

## 2023-04-10 LAB — CMP (CANCER CENTER ONLY)
ALT: 23 U/L (ref 0–44)
AST: 33 U/L (ref 15–41)
Albumin: 4.2 g/dL (ref 3.5–5.0)
Alkaline Phosphatase: 146 U/L — ABNORMAL HIGH (ref 38–126)
Anion gap: 6 (ref 5–15)
BUN: 16 mg/dL (ref 8–23)
CO2: 30 mmol/L (ref 22–32)
Calcium: 9.4 mg/dL (ref 8.9–10.3)
Chloride: 103 mmol/L (ref 98–111)
Creatinine: 1.63 mg/dL — ABNORMAL HIGH (ref 0.61–1.24)
GFR, Estimated: 48 mL/min — ABNORMAL LOW (ref >60)
Glucose, Bld: 110 mg/dL — ABNORMAL HIGH (ref 70–99)
Potassium: 4 mmol/L (ref 3.5–5.1)
Sodium: 139 mmol/L (ref 135–145)
Total Bilirubin: 0.5 mg/dL (ref 0.3–1.2)
Total Protein: 7 g/dL (ref 6.5–8.1)

## 2023-04-10 LAB — CBC WITH DIFFERENTIAL (CANCER CENTER ONLY)
Abs Immature Granulocytes: 0.02 10*3/uL (ref 0.00–0.07)
Basophils Absolute: 0.1 10*3/uL (ref 0.0–0.1)
Basophils Relative: 1 %
Eosinophils Absolute: 0.3 10*3/uL (ref 0.0–0.5)
Eosinophils Relative: 3 %
HCT: 46.5 % (ref 39.0–52.0)
Hemoglobin: 15.1 g/dL (ref 13.0–17.0)
Immature Granulocytes: 0 %
Lymphocytes Relative: 12 %
Lymphs Abs: 0.9 10*3/uL (ref 0.7–4.0)
MCH: 27.8 pg (ref 26.0–34.0)
MCHC: 32.5 g/dL (ref 30.0–36.0)
MCV: 85.5 fL (ref 80.0–100.0)
Monocytes Absolute: 0.5 10*3/uL (ref 0.1–1.0)
Monocytes Relative: 7 %
Neutro Abs: 5.9 10*3/uL (ref 1.7–7.7)
Neutrophils Relative %: 77 %
Platelet Count: 194 10*3/uL (ref 150–400)
RBC: 5.44 MIL/uL (ref 4.22–5.81)
RDW: 15.9 % — ABNORMAL HIGH (ref 11.5–15.5)
WBC Count: 7.7 10*3/uL (ref 4.0–10.5)
nRBC: 0 % (ref 0.0–0.2)

## 2023-04-10 LAB — MAGNESIUM: Magnesium: 1.9 mg/dL (ref 1.7–2.4)

## 2023-04-10 MED ORDER — SODIUM CHLORIDE 0.9% FLUSH
10.0000 mL | INTRAVENOUS | Status: AC | PRN
  Administered 2023-04-10: 10 mL

## 2023-04-10 MED ORDER — SODIUM CHLORIDE 0.9 % IV SOLN: INTRAVENOUS | Status: DC

## 2023-04-10 MED ORDER — LIDOCAINE-PRILOCAINE 2.5-2.5 % EX CREA
1.0000 | TOPICAL_CREAM | CUTANEOUS | 5 refills | Status: DC | PRN
Start: 2023-04-10 — End: 2023-11-02
  Filled 2023-04-10: qty 60, 60d supply, fill #0
  Filled 2023-06-25: qty 60, 60d supply, fill #1

## 2023-04-10 MED ORDER — HEPARIN SOD (PORK) LOCK FLUSH 100 UNIT/ML IV SOLN
500.0000 [IU] | INTRAVENOUS | Status: AC | PRN
  Administered 2023-04-10: 500 [IU]

## 2023-04-10 NOTE — Progress Notes (Signed)
[facility] OFFICE PROGRESS NOTE   Diagnosis: Rectal cancer  INTERVAL HISTORY:   Kenneth [name] returns as scheduled.  He continues twice weekly IV fluids.  He feels well.  He is exercising.  No pain.  He continues Lomotil and Imodium.  He has a good appetite.  Objective:  Vital signs in last 24 hours:  Blood pressure (!) 134/92, pulse 78, temperature 98.1 F (36.7 C), temperature source Oral, resp. rate 18, height 5\' 11"  (1.803 m), weight 210 lb 11.2 oz (95.6 kg), SpO2 98 %.    Lymphatics: No palpable cervical, supraclavicular, axillary or inguinal lymph nodes. Resp: Lungs clear bilaterally. Cardio: Regular rate and rhythm. GI: No hepatosplenomegaly.  Right abdomen ileostomy. Vascular: No leg edema. Port-A-Cath without erythema.  Lab Results:  Lab Results  Component Value Date   WBC 7.7 04/10/2023   HGB 15.1 04/10/2023   HCT 46.5 04/10/2023   MCV 85.5 04/10/2023   PLT 194 04/10/2023   NEUTROABS 5.9 04/10/2023    Imaging:  No results found.  Medications: I have reviewed the patient's current medications.  Assessment/Plan: Rectal cancer-mass at 11 cm on proctoscopy by Dr. Byrd Hesselbach 02/04/2022 Colonoscopy 12/26/2021-obstructing mass at the rectosigmoid measured at 15 cm, biopsy invasive moderate to poorly differentiated adenocarcinoma, mismatch repair protein expression intact, MSS, tumor mutation burden 4, RAS wild-type CTs 12/31/2021-masslike circumferential thickening of the distal sigmoid colon with mildly enlarged pericolonic lymph nodes, numerous subcentimeter hypodense liver lesions, splenomegaly MRI abdomen 01/07/2022-innumerable T2 hyperintense liver lesions consistent with cysts, spleen unremarkable, no ascites or adenopathy, moderate colonic fecal retention, no bowel obstruction MRI pelvis 01/22/2022-T3cN2 tumor above and below the peritoneal reflection, tumor 11.3 cm from the anal verge and 6.1 cm from the sphincter complex, multiple enlarged perirectal  nodes, 1.1 cm node versus tumor deposit at the right aspect of the tumor Diverting loop ileostomy 01/15/2022 Cycle 1 FOLFOX 02/17/2022 Cycle 2 FOLFOX 03/04/2022 Cycle 3 FOLFOX held on 03/17/2022 due to neutropenia Cycle 3 FOLFOX 03/17/2022, Udenyca Treatment held 03/31/2022 due to dehydration Cycle 4 FOLFOX 04/07/2022 Cycle 5 FOLFOX 04/22/2022, oxaliplatin dose reduced due to thrombocytopenia Cycle 6 FOLFOX 05/05/2022 Cycle 7 FOLFOX 05/19/2022 Cycle 8 FOLFOX held secondary to patient preference and toxicity (diarrhea/weight loss/neuropathy symptoms) 06/16/2022 radiation/Xeloda-07/24/2022 MRI 08/27/2022-no suspicious lymph nodes; significant reduction in size with near complete resolution of previously identified tumor deposit versus node along the right aspect of the tumor; overall reduction in bulk and signal of mucinous high rectal malignancy, mrTRG-3 10/06/2022-exploratory laparotomy and laparoscopy-carcinomatosis with low-volume peritoneal disease, dominant area adjacent to sigmoid and right lower quadrant, less than 5 mm implants in the right upper quadrant and left lower quadrant.  PCI 5 CTs 11/14/2022-decrease circumferential wall thickening in the upper rectum and decreased mesorectal lymphadenopathy, no evidence of distant metastatic disease, stable mild splenomegaly, focal airspace opacity in the left upper lobe-likely inflammatory Gout Psoriasis Hypertension Left scalene node versus cyst/lipoma on exam 02/03/2022-CT neck 02/10/2022 with 9.5 mm lymph node in the left supraclavicular region posterior to the sternocleidomastoid muscle.  No other prominent lymph nodes in the neck.  Biopsy 02/12/2022 compatible with benign lymph node, negative for metastatic carcinoma.    Disposition: Kenneth [name] appears well.  There is no clinical evidence for progression of the metastatic rectal cancer.  We discussed timing of restaging CTs.  He would like to hold off on this for now.  Plan to continue twice weekly IV  fluids.  We will see him in follow-up in 4 weeks.    Lonna Cobb  ANP/GNP-BC   04/10/2023  10:23 AM

## 2023-04-10 NOTE — Patient Instructions (Signed)

## 2023-04-14 ENCOUNTER — Inpatient Hospital Stay: Payer: BC Managed Care – PPO

## 2023-04-14 VITALS — BP 129/85 | HR 66 | Temp 98.2°F | Resp 18 | Ht 71.0 in | Wt 212.4 lb

## 2023-04-14 DIAGNOSIS — E860 Dehydration: Secondary | ICD-10-CM | POA: Diagnosis not present

## 2023-04-14 DIAGNOSIS — C20 Malignant neoplasm of rectum: Secondary | ICD-10-CM | POA: Diagnosis not present

## 2023-04-14 DIAGNOSIS — Z923 Personal history of irradiation: Secondary | ICD-10-CM | POA: Diagnosis not present

## 2023-04-14 DIAGNOSIS — Z9221 Personal history of antineoplastic chemotherapy: Secondary | ICD-10-CM | POA: Diagnosis not present

## 2023-04-14 DIAGNOSIS — R198 Other specified symptoms and signs involving the digestive system and abdomen: Secondary | ICD-10-CM

## 2023-04-14 DIAGNOSIS — I10 Essential (primary) hypertension: Secondary | ICD-10-CM | POA: Diagnosis not present

## 2023-04-14 DIAGNOSIS — M109 Gout, unspecified: Secondary | ICD-10-CM | POA: Diagnosis not present

## 2023-04-14 MED ORDER — HEPARIN SOD (PORK) LOCK FLUSH 100 UNIT/ML IV SOLN
500.0000 [IU] | INTRAVENOUS | Status: AC | PRN
  Administered 2023-04-14: 500 [IU]

## 2023-04-14 MED ORDER — SODIUM CHLORIDE 0.9% FLUSH
10.0000 mL | INTRAVENOUS | Status: AC | PRN
  Administered 2023-04-14: 10 mL

## 2023-04-14 MED ORDER — SODIUM CHLORIDE 0.9 % IV SOLN: INTRAVENOUS | Status: AC

## 2023-04-14 NOTE — Patient Instructions (Signed)

## 2023-04-16 DIAGNOSIS — Z932 Ileostomy status: Secondary | ICD-10-CM | POA: Diagnosis not present

## 2023-04-17 ENCOUNTER — Inpatient Hospital Stay: Payer: BC Managed Care – PPO

## 2023-04-17 VITALS — BP 124/88 | HR 60 | Temp 98.5°F | Resp 18

## 2023-04-17 DIAGNOSIS — Z923 Personal history of irradiation: Secondary | ICD-10-CM | POA: Diagnosis not present

## 2023-04-17 DIAGNOSIS — Z9221 Personal history of antineoplastic chemotherapy: Secondary | ICD-10-CM | POA: Diagnosis not present

## 2023-04-17 DIAGNOSIS — C20 Malignant neoplasm of rectum: Secondary | ICD-10-CM | POA: Diagnosis not present

## 2023-04-17 DIAGNOSIS — R198 Other specified symptoms and signs involving the digestive system and abdomen: Secondary | ICD-10-CM

## 2023-04-17 DIAGNOSIS — I10 Essential (primary) hypertension: Secondary | ICD-10-CM | POA: Diagnosis not present

## 2023-04-17 DIAGNOSIS — M109 Gout, unspecified: Secondary | ICD-10-CM | POA: Diagnosis not present

## 2023-04-17 DIAGNOSIS — E860 Dehydration: Secondary | ICD-10-CM | POA: Diagnosis not present

## 2023-04-17 MED ORDER — SODIUM CHLORIDE 0.9 % IV SOLN: INTRAVENOUS | Status: AC

## 2023-04-17 MED ORDER — SODIUM CHLORIDE 0.9% FLUSH
10.0000 mL | INTRAVENOUS | Status: AC | PRN
  Administered 2023-04-17: 10 mL

## 2023-04-17 MED ORDER — HEPARIN SOD (PORK) LOCK FLUSH 100 UNIT/ML IV SOLN
500.0000 [IU] | INTRAVENOUS | Status: AC | PRN
  Administered 2023-04-17: 500 [IU]

## 2023-04-17 NOTE — Patient Instructions (Signed)

## 2023-04-21 ENCOUNTER — Other Ambulatory Visit: Payer: Self-pay | Admitting: *Deleted

## 2023-04-21 ENCOUNTER — Inpatient Hospital Stay: Payer: BC Managed Care – PPO | Attending: Oncology

## 2023-04-21 VITALS — BP 135/93 | HR 72 | Temp 97.7°F | Resp 18 | Ht 71.0 in | Wt 211.8 lb

## 2023-04-21 DIAGNOSIS — C20 Malignant neoplasm of rectum: Secondary | ICD-10-CM | POA: Diagnosis not present

## 2023-04-21 DIAGNOSIS — Z79899 Other long term (current) drug therapy: Secondary | ICD-10-CM | POA: Insufficient documentation

## 2023-04-21 DIAGNOSIS — I10 Essential (primary) hypertension: Secondary | ICD-10-CM | POA: Insufficient documentation

## 2023-04-21 DIAGNOSIS — Z9221 Personal history of antineoplastic chemotherapy: Secondary | ICD-10-CM | POA: Insufficient documentation

## 2023-04-21 DIAGNOSIS — E860 Dehydration: Secondary | ICD-10-CM | POA: Insufficient documentation

## 2023-04-21 DIAGNOSIS — M109 Gout, unspecified: Secondary | ICD-10-CM | POA: Diagnosis not present

## 2023-04-21 DIAGNOSIS — Z923 Personal history of irradiation: Secondary | ICD-10-CM | POA: Insufficient documentation

## 2023-04-21 DIAGNOSIS — R198 Other specified symptoms and signs involving the digestive system and abdomen: Secondary | ICD-10-CM

## 2023-04-21 MED ORDER — SODIUM CHLORIDE 0.9 % IV SOLN: INTRAVENOUS | Status: AC

## 2023-04-21 NOTE — Patient Instructions (Signed)

## 2023-04-24 ENCOUNTER — Inpatient Hospital Stay: Payer: BC Managed Care – PPO

## 2023-04-24 DIAGNOSIS — Z9221 Personal history of antineoplastic chemotherapy: Secondary | ICD-10-CM | POA: Diagnosis not present

## 2023-04-24 DIAGNOSIS — Z79899 Other long term (current) drug therapy: Secondary | ICD-10-CM | POA: Diagnosis not present

## 2023-04-24 DIAGNOSIS — R198 Other specified symptoms and signs involving the digestive system and abdomen: Secondary | ICD-10-CM

## 2023-04-24 DIAGNOSIS — M109 Gout, unspecified: Secondary | ICD-10-CM | POA: Diagnosis not present

## 2023-04-24 DIAGNOSIS — I10 Essential (primary) hypertension: Secondary | ICD-10-CM | POA: Diagnosis not present

## 2023-04-24 DIAGNOSIS — C20 Malignant neoplasm of rectum: Secondary | ICD-10-CM | POA: Diagnosis not present

## 2023-04-24 DIAGNOSIS — Z923 Personal history of irradiation: Secondary | ICD-10-CM | POA: Diagnosis not present

## 2023-04-24 DIAGNOSIS — E860 Dehydration: Secondary | ICD-10-CM | POA: Diagnosis not present

## 2023-04-24 MED ORDER — SODIUM CHLORIDE 0.9% FLUSH
10.0000 mL | Freq: Once | INTRAVENOUS | Status: AC
  Administered 2023-04-24: 10 mL via INTRAVENOUS

## 2023-04-24 MED ORDER — SODIUM CHLORIDE 0.9 % IV SOLN: INTRAVENOUS | Status: AC

## 2023-04-24 MED ORDER — HEPARIN SOD (PORK) LOCK FLUSH 100 UNIT/ML IV SOLN
500.0000 [IU] | Freq: Once | INTRAVENOUS | Status: AC
  Administered 2023-04-24: 500 [IU] via INTRAVENOUS

## 2023-04-24 NOTE — Patient Instructions (Signed)

## 2023-04-27 ENCOUNTER — Other Ambulatory Visit: Payer: Self-pay | Admitting: *Deleted

## 2023-04-27 DIAGNOSIS — R198 Other specified symptoms and signs involving the digestive system and abdomen: Secondary | ICD-10-CM

## 2023-04-28 ENCOUNTER — Inpatient Hospital Stay: Payer: BC Managed Care – PPO

## 2023-04-28 DIAGNOSIS — C20 Malignant neoplasm of rectum: Secondary | ICD-10-CM | POA: Diagnosis not present

## 2023-04-28 DIAGNOSIS — Z9221 Personal history of antineoplastic chemotherapy: Secondary | ICD-10-CM | POA: Diagnosis not present

## 2023-04-28 DIAGNOSIS — R198 Other specified symptoms and signs involving the digestive system and abdomen: Secondary | ICD-10-CM

## 2023-04-28 DIAGNOSIS — M109 Gout, unspecified: Secondary | ICD-10-CM | POA: Diagnosis not present

## 2023-04-28 DIAGNOSIS — Z923 Personal history of irradiation: Secondary | ICD-10-CM | POA: Diagnosis not present

## 2023-04-28 DIAGNOSIS — I10 Essential (primary) hypertension: Secondary | ICD-10-CM | POA: Diagnosis not present

## 2023-04-28 DIAGNOSIS — Z79899 Other long term (current) drug therapy: Secondary | ICD-10-CM | POA: Diagnosis not present

## 2023-04-28 DIAGNOSIS — E860 Dehydration: Secondary | ICD-10-CM | POA: Diagnosis not present

## 2023-04-28 MED ORDER — HEPARIN SOD (PORK) LOCK FLUSH 100 UNIT/ML IV SOLN
500.0000 [IU] | INTRAVENOUS | Status: AC | PRN
  Administered 2023-04-28: 500 [IU]

## 2023-04-28 MED ORDER — SODIUM CHLORIDE 0.9 % IV SOLN: INTRAVENOUS | Status: AC

## 2023-04-28 MED ORDER — SODIUM CHLORIDE 0.9% FLUSH
10.0000 mL | INTRAVENOUS | Status: AC | PRN
  Administered 2023-04-28: 10 mL

## 2023-04-28 NOTE — Patient Instructions (Signed)

## 2023-04-30 ENCOUNTER — Other Ambulatory Visit: Payer: Self-pay

## 2023-04-30 ENCOUNTER — Other Ambulatory Visit (HOSPITAL_BASED_OUTPATIENT_CLINIC_OR_DEPARTMENT_OTHER): Payer: Self-pay

## 2023-04-30 ENCOUNTER — Other Ambulatory Visit: Payer: Self-pay | Admitting: Nurse Practitioner

## 2023-04-30 DIAGNOSIS — C20 Malignant neoplasm of rectum: Secondary | ICD-10-CM

## 2023-04-30 MED ORDER — DIPHENOXYLATE-ATROPINE 2.5-0.025 MG PO TABS
2.0000 | ORAL_TABLET | Freq: Four times a day (QID) | ORAL | 2 refills | Status: DC | PRN
Start: 2023-04-30 — End: 2023-07-30
  Filled 2023-04-30: qty 240, 30d supply, fill #0
  Filled 2023-06-01: qty 240, 30d supply, fill #1
  Filled 2023-06-29: qty 240, 30d supply, fill #2

## 2023-05-01 ENCOUNTER — Inpatient Hospital Stay: Payer: BC Managed Care – PPO

## 2023-05-01 DIAGNOSIS — Z79899 Other long term (current) drug therapy: Secondary | ICD-10-CM | POA: Diagnosis not present

## 2023-05-01 DIAGNOSIS — R198 Other specified symptoms and signs involving the digestive system and abdomen: Secondary | ICD-10-CM

## 2023-05-01 DIAGNOSIS — Z923 Personal history of irradiation: Secondary | ICD-10-CM | POA: Diagnosis not present

## 2023-05-01 DIAGNOSIS — I10 Essential (primary) hypertension: Secondary | ICD-10-CM | POA: Diagnosis not present

## 2023-05-01 DIAGNOSIS — C20 Malignant neoplasm of rectum: Secondary | ICD-10-CM | POA: Diagnosis not present

## 2023-05-01 DIAGNOSIS — Z9221 Personal history of antineoplastic chemotherapy: Secondary | ICD-10-CM | POA: Diagnosis not present

## 2023-05-01 DIAGNOSIS — E860 Dehydration: Secondary | ICD-10-CM | POA: Diagnosis not present

## 2023-05-01 DIAGNOSIS — M109 Gout, unspecified: Secondary | ICD-10-CM | POA: Diagnosis not present

## 2023-05-01 MED ORDER — SODIUM CHLORIDE 0.9 % IV SOLN: INTRAVENOUS | Status: AC

## 2023-05-01 MED ORDER — SODIUM CHLORIDE 0.9% FLUSH
10.0000 mL | Freq: Once | INTRAVENOUS | Status: AC
  Administered 2023-05-01: 10 mL via INTRAVENOUS

## 2023-05-01 MED ORDER — HEPARIN SOD (PORK) LOCK FLUSH 100 UNIT/ML IV SOLN
500.0000 [IU] | Freq: Once | INTRAVENOUS | Status: AC
  Administered 2023-05-01: 500 [IU] via INTRAVENOUS

## 2023-05-01 NOTE — Patient Instructions (Signed)

## 2023-05-04 ENCOUNTER — Other Ambulatory Visit: Payer: Self-pay | Admitting: *Deleted

## 2023-05-04 DIAGNOSIS — R198 Other specified symptoms and signs involving the digestive system and abdomen: Secondary | ICD-10-CM

## 2023-05-05 ENCOUNTER — Inpatient Hospital Stay: Payer: BC Managed Care – PPO

## 2023-05-05 VITALS — BP 127/78 | HR 70 | Temp 98.1°F | Resp 18 | Ht 71.0 in | Wt 212.0 lb

## 2023-05-05 DIAGNOSIS — M109 Gout, unspecified: Secondary | ICD-10-CM | POA: Diagnosis not present

## 2023-05-05 DIAGNOSIS — Z95828 Presence of other vascular implants and grafts: Secondary | ICD-10-CM

## 2023-05-05 DIAGNOSIS — E860 Dehydration: Secondary | ICD-10-CM | POA: Diagnosis not present

## 2023-05-05 DIAGNOSIS — Z923 Personal history of irradiation: Secondary | ICD-10-CM | POA: Diagnosis not present

## 2023-05-05 DIAGNOSIS — Z9221 Personal history of antineoplastic chemotherapy: Secondary | ICD-10-CM | POA: Diagnosis not present

## 2023-05-05 DIAGNOSIS — Z79899 Other long term (current) drug therapy: Secondary | ICD-10-CM | POA: Diagnosis not present

## 2023-05-05 DIAGNOSIS — C20 Malignant neoplasm of rectum: Secondary | ICD-10-CM | POA: Diagnosis not present

## 2023-05-05 DIAGNOSIS — R198 Other specified symptoms and signs involving the digestive system and abdomen: Secondary | ICD-10-CM

## 2023-05-05 DIAGNOSIS — I10 Essential (primary) hypertension: Secondary | ICD-10-CM | POA: Diagnosis not present

## 2023-05-05 MED ORDER — SODIUM CHLORIDE 0.9% FLUSH
10.0000 mL | Freq: Once | INTRAVENOUS | Status: AC
  Administered 2023-05-05: 10 mL via INTRAVENOUS

## 2023-05-05 MED ORDER — HEPARIN SOD (PORK) LOCK FLUSH 100 UNIT/ML IV SOLN
500.0000 [IU] | Freq: Once | INTRAVENOUS | Status: AC
  Administered 2023-05-05: 500 [IU] via INTRAVENOUS

## 2023-05-05 MED ORDER — SODIUM CHLORIDE 0.9 % IV SOLN: INTRAVENOUS | Status: AC

## 2023-05-05 NOTE — Patient Instructions (Signed)

## 2023-05-06 ENCOUNTER — Other Ambulatory Visit: Payer: Self-pay | Admitting: *Deleted

## 2023-05-06 DIAGNOSIS — R198 Other specified symptoms and signs involving the digestive system and abdomen: Secondary | ICD-10-CM

## 2023-05-08 ENCOUNTER — Inpatient Hospital Stay: Payer: BC Managed Care – PPO

## 2023-05-08 ENCOUNTER — Other Ambulatory Visit: Payer: Self-pay | Admitting: *Deleted

## 2023-05-08 ENCOUNTER — Encounter: Payer: Self-pay | Admitting: Oncology

## 2023-05-08 DIAGNOSIS — R198 Other specified symptoms and signs involving the digestive system and abdomen: Secondary | ICD-10-CM

## 2023-05-08 DIAGNOSIS — Z79899 Other long term (current) drug therapy: Secondary | ICD-10-CM | POA: Diagnosis not present

## 2023-05-08 DIAGNOSIS — E860 Dehydration: Secondary | ICD-10-CM | POA: Diagnosis not present

## 2023-05-08 DIAGNOSIS — M109 Gout, unspecified: Secondary | ICD-10-CM | POA: Diagnosis not present

## 2023-05-08 DIAGNOSIS — I10 Essential (primary) hypertension: Secondary | ICD-10-CM | POA: Diagnosis not present

## 2023-05-08 DIAGNOSIS — C20 Malignant neoplasm of rectum: Secondary | ICD-10-CM

## 2023-05-08 DIAGNOSIS — Z9221 Personal history of antineoplastic chemotherapy: Secondary | ICD-10-CM | POA: Diagnosis not present

## 2023-05-08 DIAGNOSIS — Z923 Personal history of irradiation: Secondary | ICD-10-CM | POA: Diagnosis not present

## 2023-05-08 MED ORDER — SODIUM CHLORIDE 0.9% FLUSH
10.0000 mL | INTRAVENOUS | Status: AC | PRN
  Administered 2023-05-08: 10 mL

## 2023-05-08 MED ORDER — SODIUM CHLORIDE 0.9 % IV SOLN: INTRAVENOUS | Status: AC

## 2023-05-08 MED ORDER — HEPARIN SOD (PORK) LOCK FLUSH 100 UNIT/ML IV SOLN
500.0000 [IU] | INTRAVENOUS | Status: AC | PRN
  Administered 2023-05-08: 500 [IU]

## 2023-05-08 NOTE — Patient Instructions (Signed)

## 2023-05-11 ENCOUNTER — Telehealth: Payer: Self-pay | Admitting: Oncology

## 2023-05-11 ENCOUNTER — Other Ambulatory Visit: Payer: Self-pay | Admitting: Oncology

## 2023-05-11 DIAGNOSIS — E222 Syndrome of inappropriate secretion of antidiuretic hormone: Secondary | ICD-10-CM

## 2023-05-11 DIAGNOSIS — C20 Malignant neoplasm of rectum: Secondary | ICD-10-CM

## 2023-05-11 NOTE — Telephone Encounter (Signed)
Pt called to inquire about adding testosterone and estrogen levels to be drawn for upcoming lab appointment. Dr [name] approved lab tests added.

## 2023-05-12 ENCOUNTER — Inpatient Hospital Stay: Payer: BC Managed Care – PPO

## 2023-05-12 ENCOUNTER — Other Ambulatory Visit: Payer: Self-pay

## 2023-05-12 ENCOUNTER — Other Ambulatory Visit (HOSPITAL_COMMUNITY): Payer: Self-pay

## 2023-05-12 ENCOUNTER — Inpatient Hospital Stay (HOSPITAL_BASED_OUTPATIENT_CLINIC_OR_DEPARTMENT_OTHER): Payer: BC Managed Care – PPO | Admitting: Oncology

## 2023-05-12 VITALS — BP 137/85 | HR 76 | Temp 98.1°F | Resp 18 | Ht 71.0 in | Wt 210.8 lb

## 2023-05-12 DIAGNOSIS — M109 Gout, unspecified: Secondary | ICD-10-CM | POA: Diagnosis not present

## 2023-05-12 DIAGNOSIS — C20 Malignant neoplasm of rectum: Secondary | ICD-10-CM | POA: Diagnosis not present

## 2023-05-12 DIAGNOSIS — R198 Other specified symptoms and signs involving the digestive system and abdomen: Secondary | ICD-10-CM

## 2023-05-12 DIAGNOSIS — E860 Dehydration: Secondary | ICD-10-CM | POA: Diagnosis not present

## 2023-05-12 DIAGNOSIS — Z9221 Personal history of antineoplastic chemotherapy: Secondary | ICD-10-CM | POA: Diagnosis not present

## 2023-05-12 DIAGNOSIS — Z923 Personal history of irradiation: Secondary | ICD-10-CM | POA: Diagnosis not present

## 2023-05-12 DIAGNOSIS — I10 Essential (primary) hypertension: Secondary | ICD-10-CM | POA: Diagnosis not present

## 2023-05-12 DIAGNOSIS — Z932 Ileostomy status: Secondary | ICD-10-CM

## 2023-05-12 DIAGNOSIS — Z79899 Other long term (current) drug therapy: Secondary | ICD-10-CM | POA: Diagnosis not present

## 2023-05-12 LAB — CMP (CANCER CENTER ONLY)
ALT: 21 U/L (ref 0–44)
AST: 31 U/L (ref 15–41)
Albumin: 4.1 g/dL (ref 3.5–5.0)
Alkaline Phosphatase: 125 U/L (ref 38–126)
Anion gap: 8 (ref 5–15)
BUN: 18 mg/dL (ref 8–23)
CO2: 29 mmol/L (ref 22–32)
Calcium: 9.4 mg/dL (ref 8.9–10.3)
Chloride: 101 mmol/L (ref 98–111)
Creatinine: 1.69 mg/dL — ABNORMAL HIGH (ref 0.61–1.24)
GFR, Estimated: 46 mL/min — ABNORMAL LOW (ref >60)
Glucose, Bld: 144 mg/dL — ABNORMAL HIGH (ref 70–99)
Potassium: 3.7 mmol/L (ref 3.5–5.1)
Sodium: 138 mmol/L (ref 135–145)
Total Bilirubin: 0.5 mg/dL (ref 0.3–1.2)
Total Protein: 7 g/dL (ref 6.5–8.1)

## 2023-05-12 LAB — MAGNESIUM: Magnesium: 1.8 mg/dL (ref 1.7–2.4)

## 2023-05-12 MED ORDER — OPIUM 10 MG/ML (1%) PO TINC
0.6000 mL | Freq: Four times a day (QID) | ORAL | 0 refills | Status: DC | PRN
  Filled 2023-05-12 – 2023-05-18 (×2): qty 30, 13d supply, fill #0

## 2023-05-12 MED ORDER — HEPARIN SOD (PORK) LOCK FLUSH 100 UNIT/ML IV SOLN
500.0000 [IU] | INTRAVENOUS | Status: AC | PRN
  Administered 2023-05-12: 500 [IU]

## 2023-05-12 MED ORDER — SODIUM CHLORIDE 0.9 % IV SOLN: Freq: Once | INTRAVENOUS | Status: AC

## 2023-05-12 MED ORDER — SODIUM CHLORIDE 0.9% FLUSH
10.0000 mL | INTRAVENOUS | Status: AC | PRN
  Administered 2023-05-12: 10 mL

## 2023-05-12 NOTE — Patient Instructions (Signed)

## 2023-05-12 NOTE — Progress Notes (Signed)
[facility] OFFICE PROGRESS NOTE   Diagnosis: Rectal cancer  INTERVAL HISTORY:   Kenneth [name] returns as scheduled.  He continues to empty the ileostomy 8-10 times daily.  The output is liquid.  He is using Imodium and Lomotil.  He continues to receive IV fluids twice weekly.  He is drinking increased fluids.  He reports feeling better after receiving IV fluids. He had a transient episode of low abdominal cramping pain during sexual intercourse.  This lasted for a few minutes and resolved.  Good appetite and energy level.  He is exercising.  Objective:  Vital signs in last 24 hours:  Blood pressure 137/85, pulse 76, temperature 98.1 F (36.7 C), temperature source Oral, resp. rate 18, height 5\' 11"  (1.803 m), weight 210 lb 12.8 oz (95.6 kg), SpO2 100 %.    HEENT: The mouth is dry, no thruh Resp: Lungs clear bilaterally Cardio: Regular rate and rhythm GI: Nontender, no mass, right abdomen ileostomy Vascular: No leg edema   Portacath/PICC-without erythema  Lab Results:  Lab Results  Component Value Date   WBC 7.7 04/10/2023   HGB 15.1 04/10/2023   HCT 46.5 04/10/2023   MCV 85.5 04/10/2023   PLT 194 04/10/2023   NEUTROABS 5.9 04/10/2023    CMP  Lab Results  Component Value Date   NA 139 04/10/2023   K 4.0 04/10/2023   CL 103 04/10/2023   CO2 30 04/10/2023   GLUCOSE 110 (H) 04/10/2023   BUN 16 04/10/2023   CREATININE 1.63 (H) 04/10/2023   CALCIUM 9.4 04/10/2023   PROT 7.0 04/10/2023   ALBUMIN 4.2 04/10/2023   AST 33 04/10/2023   ALT 23 04/10/2023   ALKPHOS 146 (H) 04/10/2023   BILITOT 0.5 04/10/2023   GFRNONAA 48 (L) 04/10/2023   GFRAA 84 03/17/2008    Lab Results  Component Value Date   CEA 2.29 03/17/2023     Medications: I have reviewed the patient's current medications.   Assessment/Plan: Rectal cancer-mass at 11 cm on proctoscopy by Dr. Byrd Hesselbach 02/04/2022 Colonoscopy 12/26/2021-obstructing mass at the rectosigmoid measured at 15 cm,  biopsy invasive moderate to poorly differentiated adenocarcinoma, mismatch repair protein expression intact, MSS, tumor mutation burden 4, RAS wild-type CTs 12/31/2021-masslike circumferential thickening of the distal sigmoid colon with mildly enlarged pericolonic lymph nodes, numerous subcentimeter hypodense liver lesions, splenomegaly MRI abdomen 01/07/2022-innumerable T2 hyperintense liver lesions consistent with cysts, spleen unremarkable, no ascites or adenopathy, moderate colonic fecal retention, no bowel obstruction MRI pelvis 01/22/2022-T3cN2 tumor above and below the peritoneal reflection, tumor 11.3 cm from the anal verge and 6.1 cm from the sphincter complex, multiple enlarged perirectal nodes, 1.1 cm node versus tumor deposit at the right aspect of the tumor Diverting loop ileostomy 01/15/2022 Cycle 1 FOLFOX 02/17/2022 Cycle 2 FOLFOX 03/04/2022 Cycle 3 FOLFOX held on 03/17/2022 due to neutropenia Cycle 3 FOLFOX 03/17/2022, Udenyca Treatment held 03/31/2022 due to dehydration Cycle 4 FOLFOX 04/07/2022 Cycle 5 FOLFOX 04/22/2022, oxaliplatin dose reduced due to thrombocytopenia Cycle 6 FOLFOX 05/05/2022 Cycle 7 FOLFOX 05/19/2022 Cycle 8 FOLFOX held secondary to patient preference and toxicity (diarrhea/weight loss/neuropathy symptoms) 06/16/2022 radiation/Xeloda-07/24/2022 MRI 08/27/2022-no suspicious lymph nodes; significant reduction in size with near complete resolution of previously identified tumor deposit versus node along the right aspect of the tumor; overall reduction in bulk and signal of mucinous high rectal malignancy, mrTRG-3 10/06/2022-exploratory laparotomy and laparoscopy-carcinomatosis with low-volume peritoneal disease, dominant area adjacent to sigmoid and right lower quadrant, less than 5 mm implants in the right upper quadrant  and left lower quadrant.  PCI 5 CTs 11/14/2022-decrease circumferential wall thickening in the upper rectum and decreased mesorectal lymphadenopathy, no evidence of  distant metastatic disease, stable mild splenomegaly, focal airspace opacity in the left upper lobe-likely inflammatory Gout Psoriasis Hypertension Left scalene node versus cyst/lipoma on exam 02/03/2022-CT neck 02/10/2022 with 9.5 mm lymph node in the left supraclavicular region posterior to the sternocleidomastoid muscle.  No other prominent lymph nodes in the neck.  Biopsy 02/12/2022 compatible with benign lymph node, negative for metastatic carcinoma.      Disposition: Kenneth [name] has a history of metastatic rectal cancer.  There is no clinical evidence of disease progression.  He has a high output ileostomy.  He continues to have high output from the ostomy despite Imodium and Lomotil.  He will begin a trial of tincture of opium.  We reviewed potential side effects associated with the tincture of opium.  He agrees to proceed.  He will continue to receive IV fluids twice weekly.  Kenneth [name] will return for an office visit in 4 weeks.  He will be scheduled for surveillance imaging in August or September.  Thornton Papas, MD  05/12/2023  9:18 AM

## 2023-05-13 ENCOUNTER — Other Ambulatory Visit (HOSPITAL_COMMUNITY): Payer: Self-pay

## 2023-05-13 ENCOUNTER — Encounter: Payer: Self-pay | Admitting: Oncology

## 2023-05-15 ENCOUNTER — Inpatient Hospital Stay: Payer: BC Managed Care – PPO

## 2023-05-15 ENCOUNTER — Other Ambulatory Visit: Payer: Self-pay | Admitting: Oncology

## 2023-05-15 ENCOUNTER — Other Ambulatory Visit (HOSPITAL_BASED_OUTPATIENT_CLINIC_OR_DEPARTMENT_OTHER): Payer: Self-pay

## 2023-05-15 DIAGNOSIS — Z923 Personal history of irradiation: Secondary | ICD-10-CM | POA: Diagnosis not present

## 2023-05-15 DIAGNOSIS — C20 Malignant neoplasm of rectum: Secondary | ICD-10-CM | POA: Diagnosis not present

## 2023-05-15 DIAGNOSIS — Z9221 Personal history of antineoplastic chemotherapy: Secondary | ICD-10-CM | POA: Diagnosis not present

## 2023-05-15 DIAGNOSIS — Z79899 Other long term (current) drug therapy: Secondary | ICD-10-CM | POA: Diagnosis not present

## 2023-05-15 DIAGNOSIS — E860 Dehydration: Secondary | ICD-10-CM | POA: Diagnosis not present

## 2023-05-15 DIAGNOSIS — I10 Essential (primary) hypertension: Secondary | ICD-10-CM | POA: Diagnosis not present

## 2023-05-15 DIAGNOSIS — M109 Gout, unspecified: Secondary | ICD-10-CM | POA: Diagnosis not present

## 2023-05-15 LAB — TESTOSTERONE: Testosterone: 1500 ng/dL — ABNORMAL HIGH (ref 264–916)

## 2023-05-15 MED ORDER — HEPARIN SOD (PORK) LOCK FLUSH 100 UNIT/ML IV SOLN
500.0000 [IU] | INTRAVENOUS | Status: AC | PRN
  Administered 2023-05-15: 500 [IU]

## 2023-05-15 MED ORDER — SODIUM CHLORIDE 0.9 % IV SOLN: Freq: Once | INTRAVENOUS | Status: AC

## 2023-05-15 MED ORDER — SODIUM CHLORIDE 0.9% FLUSH
10.0000 mL | INTRAVENOUS | Status: AC | PRN
  Administered 2023-05-15: 10 mL

## 2023-05-15 NOTE — Patient Instructions (Signed)

## 2023-05-18 ENCOUNTER — Other Ambulatory Visit (HOSPITAL_COMMUNITY): Payer: Self-pay

## 2023-05-19 ENCOUNTER — Inpatient Hospital Stay: Payer: BC Managed Care – PPO | Attending: Oncology

## 2023-05-19 ENCOUNTER — Inpatient Hospital Stay: Payer: BC Managed Care – PPO

## 2023-05-19 VITALS — BP 120/93 | HR 67 | Temp 98.4°F | Resp 18 | Wt 212.0 lb

## 2023-05-19 DIAGNOSIS — Z79899 Other long term (current) drug therapy: Secondary | ICD-10-CM | POA: Diagnosis not present

## 2023-05-19 DIAGNOSIS — Z923 Personal history of irradiation: Secondary | ICD-10-CM | POA: Insufficient documentation

## 2023-05-19 DIAGNOSIS — C20 Malignant neoplasm of rectum: Secondary | ICD-10-CM | POA: Insufficient documentation

## 2023-05-19 DIAGNOSIS — M109 Gout, unspecified: Secondary | ICD-10-CM | POA: Insufficient documentation

## 2023-05-19 DIAGNOSIS — I10 Essential (primary) hypertension: Secondary | ICD-10-CM | POA: Diagnosis not present

## 2023-05-19 DIAGNOSIS — E860 Dehydration: Secondary | ICD-10-CM | POA: Insufficient documentation

## 2023-05-19 DIAGNOSIS — Z9221 Personal history of antineoplastic chemotherapy: Secondary | ICD-10-CM | POA: Insufficient documentation

## 2023-05-19 DIAGNOSIS — R198 Other specified symptoms and signs involving the digestive system and abdomen: Secondary | ICD-10-CM

## 2023-05-19 LAB — ESTROGENS, TOTAL: Estrogen: 164 pg/mL (ref 56–213)

## 2023-05-19 MED ORDER — SODIUM CHLORIDE 0.9 % IV SOLN: INTRAVENOUS | Status: AC

## 2023-05-19 MED ORDER — HEPARIN SOD (PORK) LOCK FLUSH 100 UNIT/ML IV SOLN
500.0000 [IU] | INTRAVENOUS | Status: AC | PRN
  Administered 2023-05-19: 500 [IU]

## 2023-05-19 MED ORDER — SODIUM CHLORIDE 0.9% FLUSH
10.0000 mL | INTRAVENOUS | Status: AC | PRN
  Administered 2023-05-19: 10 mL

## 2023-05-19 NOTE — Patient Instructions (Signed)

## 2023-05-20 ENCOUNTER — Other Ambulatory Visit: Payer: Self-pay | Admitting: *Deleted

## 2023-05-20 DIAGNOSIS — R198 Other specified symptoms and signs involving the digestive system and abdomen: Secondary | ICD-10-CM

## 2023-05-22 ENCOUNTER — Inpatient Hospital Stay: Payer: BC Managed Care – PPO

## 2023-05-22 ENCOUNTER — Other Ambulatory Visit: Payer: Self-pay | Admitting: *Deleted

## 2023-05-22 ENCOUNTER — Telehealth: Payer: Self-pay | Admitting: Oncology

## 2023-05-22 VITALS — BP 121/89 | HR 64 | Temp 98.2°F | Resp 18

## 2023-05-22 DIAGNOSIS — Z923 Personal history of irradiation: Secondary | ICD-10-CM | POA: Diagnosis not present

## 2023-05-22 DIAGNOSIS — Z79899 Other long term (current) drug therapy: Secondary | ICD-10-CM | POA: Diagnosis not present

## 2023-05-22 DIAGNOSIS — R198 Other specified symptoms and signs involving the digestive system and abdomen: Secondary | ICD-10-CM

## 2023-05-22 DIAGNOSIS — C20 Malignant neoplasm of rectum: Secondary | ICD-10-CM | POA: Diagnosis not present

## 2023-05-22 DIAGNOSIS — Z9221 Personal history of antineoplastic chemotherapy: Secondary | ICD-10-CM | POA: Diagnosis not present

## 2023-05-22 DIAGNOSIS — M109 Gout, unspecified: Secondary | ICD-10-CM | POA: Diagnosis not present

## 2023-05-22 DIAGNOSIS — Z95828 Presence of other vascular implants and grafts: Secondary | ICD-10-CM

## 2023-05-22 DIAGNOSIS — I10 Essential (primary) hypertension: Secondary | ICD-10-CM | POA: Diagnosis not present

## 2023-05-22 DIAGNOSIS — E860 Dehydration: Secondary | ICD-10-CM | POA: Diagnosis not present

## 2023-05-22 MED ORDER — SODIUM CHLORIDE 0.9% FLUSH
10.0000 mL | Freq: Once | INTRAVENOUS | Status: AC
  Administered 2023-05-22: 10 mL via INTRAVENOUS

## 2023-05-22 MED ORDER — SODIUM CHLORIDE 0.9 % IV SOLN: INTRAVENOUS | Status: AC

## 2023-05-22 MED ORDER — HEPARIN SOD (PORK) LOCK FLUSH 100 UNIT/ML IV SOLN
500.0000 [IU] | Freq: Once | INTRAVENOUS | Status: AC
  Administered 2023-05-22: 500 [IU] via INTRAVENOUS

## 2023-05-22 NOTE — Telephone Encounter (Signed)
Scheduled appointment per 7/5 secure chat. Left voicemail for the patients spouse Marijean Niemann.

## 2023-05-22 NOTE — Patient Instructions (Signed)

## 2023-05-22 NOTE — Progress Notes (Signed)
Patient requesting estradiol level to monitor his testoterone therapy. Per lab, sample from 6/25 has been discarded. Will need re-collect. Patient notified.

## 2023-05-25 ENCOUNTER — Other Ambulatory Visit: Payer: Self-pay | Admitting: *Deleted

## 2023-05-25 DIAGNOSIS — R198 Other specified symptoms and signs involving the digestive system and abdomen: Secondary | ICD-10-CM

## 2023-05-26 ENCOUNTER — Inpatient Hospital Stay: Payer: BC Managed Care – PPO

## 2023-05-26 DIAGNOSIS — I10 Essential (primary) hypertension: Secondary | ICD-10-CM | POA: Diagnosis not present

## 2023-05-26 DIAGNOSIS — E860 Dehydration: Secondary | ICD-10-CM | POA: Diagnosis not present

## 2023-05-26 DIAGNOSIS — Z9221 Personal history of antineoplastic chemotherapy: Secondary | ICD-10-CM | POA: Diagnosis not present

## 2023-05-26 DIAGNOSIS — C20 Malignant neoplasm of rectum: Secondary | ICD-10-CM

## 2023-05-26 DIAGNOSIS — Z79899 Other long term (current) drug therapy: Secondary | ICD-10-CM | POA: Diagnosis not present

## 2023-05-26 DIAGNOSIS — R198 Other specified symptoms and signs involving the digestive system and abdomen: Secondary | ICD-10-CM

## 2023-05-26 DIAGNOSIS — Z923 Personal history of irradiation: Secondary | ICD-10-CM | POA: Diagnosis not present

## 2023-05-26 DIAGNOSIS — M109 Gout, unspecified: Secondary | ICD-10-CM | POA: Diagnosis not present

## 2023-05-26 MED ORDER — SODIUM CHLORIDE 0.9% FLUSH
10.0000 mL | Freq: Once | INTRAVENOUS | Status: AC
  Administered 2023-05-26: 10 mL via INTRAVENOUS

## 2023-05-26 MED ORDER — HEPARIN SOD (PORK) LOCK FLUSH 100 UNIT/ML IV SOLN
500.0000 [IU] | Freq: Once | INTRAVENOUS | Status: AC
  Administered 2023-05-26: 500 [IU] via INTRAVENOUS

## 2023-05-26 MED ORDER — SODIUM CHLORIDE 0.9 % IV SOLN: INTRAVENOUS | Status: DC

## 2023-05-26 NOTE — Patient Instructions (Signed)

## 2023-05-27 LAB — ESTRADIOL: Estradiol: 64.9 pg/mL — ABNORMAL HIGH (ref 7.6–42.6)

## 2023-05-29 ENCOUNTER — Inpatient Hospital Stay: Payer: BC Managed Care – PPO

## 2023-05-29 VITALS — BP 135/94 | HR 66 | Temp 98.1°F | Resp 18 | Wt 210.8 lb

## 2023-05-29 DIAGNOSIS — Z923 Personal history of irradiation: Secondary | ICD-10-CM | POA: Diagnosis not present

## 2023-05-29 DIAGNOSIS — M109 Gout, unspecified: Secondary | ICD-10-CM | POA: Diagnosis not present

## 2023-05-29 DIAGNOSIS — E860 Dehydration: Secondary | ICD-10-CM | POA: Diagnosis not present

## 2023-05-29 DIAGNOSIS — C20 Malignant neoplasm of rectum: Secondary | ICD-10-CM | POA: Diagnosis not present

## 2023-05-29 DIAGNOSIS — I10 Essential (primary) hypertension: Secondary | ICD-10-CM | POA: Diagnosis not present

## 2023-05-29 DIAGNOSIS — Z9221 Personal history of antineoplastic chemotherapy: Secondary | ICD-10-CM | POA: Diagnosis not present

## 2023-05-29 DIAGNOSIS — Z95828 Presence of other vascular implants and grafts: Secondary | ICD-10-CM

## 2023-05-29 DIAGNOSIS — Z79899 Other long term (current) drug therapy: Secondary | ICD-10-CM | POA: Diagnosis not present

## 2023-05-29 DIAGNOSIS — R198 Other specified symptoms and signs involving the digestive system and abdomen: Secondary | ICD-10-CM

## 2023-05-29 MED ORDER — HEPARIN SOD (PORK) LOCK FLUSH 100 UNIT/ML IV SOLN
500.0000 [IU] | Freq: Once | INTRAVENOUS | Status: AC
  Administered 2023-05-29: 500 [IU] via INTRAVENOUS

## 2023-05-29 MED ORDER — SODIUM CHLORIDE 0.9% FLUSH
10.0000 mL | Freq: Once | INTRAVENOUS | Status: AC
  Administered 2023-05-29: 10 mL via INTRAVENOUS

## 2023-05-29 MED ORDER — SODIUM CHLORIDE 0.9 % IV SOLN: INTRAVENOUS | Status: AC

## 2023-05-29 NOTE — Patient Instructions (Signed)

## 2023-06-01 ENCOUNTER — Other Ambulatory Visit: Payer: Self-pay | Admitting: Oncology

## 2023-06-01 ENCOUNTER — Other Ambulatory Visit: Payer: Self-pay | Admitting: *Deleted

## 2023-06-01 DIAGNOSIS — R198 Other specified symptoms and signs involving the digestive system and abdomen: Secondary | ICD-10-CM

## 2023-06-01 DIAGNOSIS — E876 Hypokalemia: Secondary | ICD-10-CM

## 2023-06-01 DIAGNOSIS — C20 Malignant neoplasm of rectum: Secondary | ICD-10-CM

## 2023-06-01 DIAGNOSIS — Z932 Ileostomy status: Secondary | ICD-10-CM | POA: Diagnosis not present

## 2023-06-02 ENCOUNTER — Other Ambulatory Visit: Payer: Self-pay | Admitting: *Deleted

## 2023-06-02 ENCOUNTER — Inpatient Hospital Stay: Payer: BC Managed Care – PPO

## 2023-06-02 ENCOUNTER — Other Ambulatory Visit (HOSPITAL_BASED_OUTPATIENT_CLINIC_OR_DEPARTMENT_OTHER): Payer: Self-pay

## 2023-06-02 DIAGNOSIS — Z79899 Other long term (current) drug therapy: Secondary | ICD-10-CM | POA: Diagnosis not present

## 2023-06-02 DIAGNOSIS — Z923 Personal history of irradiation: Secondary | ICD-10-CM | POA: Diagnosis not present

## 2023-06-02 DIAGNOSIS — R198 Other specified symptoms and signs involving the digestive system and abdomen: Secondary | ICD-10-CM

## 2023-06-02 DIAGNOSIS — C20 Malignant neoplasm of rectum: Secondary | ICD-10-CM | POA: Diagnosis not present

## 2023-06-02 DIAGNOSIS — M109 Gout, unspecified: Secondary | ICD-10-CM | POA: Diagnosis not present

## 2023-06-02 DIAGNOSIS — E860 Dehydration: Secondary | ICD-10-CM | POA: Diagnosis not present

## 2023-06-02 DIAGNOSIS — Z9221 Personal history of antineoplastic chemotherapy: Secondary | ICD-10-CM | POA: Diagnosis not present

## 2023-06-02 DIAGNOSIS — I10 Essential (primary) hypertension: Secondary | ICD-10-CM | POA: Diagnosis not present

## 2023-06-02 MED ORDER — SODIUM CHLORIDE 0.9% FLUSH
10.0000 mL | INTRAVENOUS | Status: AC | PRN
  Administered 2023-06-02: 10 mL

## 2023-06-02 MED ORDER — SODIUM CHLORIDE 0.9 % IV SOLN: INTRAVENOUS | Status: DC

## 2023-06-02 MED ORDER — POTASSIUM CHLORIDE CRYS ER 20 MEQ PO TBCR
20.0000 meq | EXTENDED_RELEASE_TABLET | Freq: Every day | ORAL | 3 refills | Status: DC
Start: 2023-06-02 — End: 2023-09-18
  Filled 2023-06-02: qty 30, 30d supply, fill #0
  Filled 2023-06-29: qty 30, 30d supply, fill #1
  Filled 2023-07-30: qty 30, 30d supply, fill #2
  Filled 2023-09-03: qty 30, 30d supply, fill #3

## 2023-06-02 MED ORDER — HEPARIN SOD (PORK) LOCK FLUSH 100 UNIT/ML IV SOLN
500.0000 [IU] | INTRAVENOUS | Status: AC | PRN
  Administered 2023-06-02: 500 [IU]

## 2023-06-02 NOTE — Patient Instructions (Signed)

## 2023-06-05 ENCOUNTER — Inpatient Hospital Stay: Payer: BC Managed Care – PPO

## 2023-06-05 DIAGNOSIS — C20 Malignant neoplasm of rectum: Secondary | ICD-10-CM | POA: Diagnosis not present

## 2023-06-05 DIAGNOSIS — E860 Dehydration: Secondary | ICD-10-CM | POA: Diagnosis not present

## 2023-06-05 DIAGNOSIS — R198 Other specified symptoms and signs involving the digestive system and abdomen: Secondary | ICD-10-CM

## 2023-06-05 DIAGNOSIS — Z9221 Personal history of antineoplastic chemotherapy: Secondary | ICD-10-CM | POA: Diagnosis not present

## 2023-06-05 DIAGNOSIS — Z79899 Other long term (current) drug therapy: Secondary | ICD-10-CM | POA: Diagnosis not present

## 2023-06-05 DIAGNOSIS — M109 Gout, unspecified: Secondary | ICD-10-CM | POA: Diagnosis not present

## 2023-06-05 DIAGNOSIS — Z923 Personal history of irradiation: Secondary | ICD-10-CM | POA: Diagnosis not present

## 2023-06-05 DIAGNOSIS — I10 Essential (primary) hypertension: Secondary | ICD-10-CM | POA: Diagnosis not present

## 2023-06-05 MED ORDER — HEPARIN SOD (PORK) LOCK FLUSH 100 UNIT/ML IV SOLN
500.0000 [IU] | Freq: Once | INTRAVENOUS | Status: AC
  Administered 2023-06-05: 500 [IU] via INTRAVENOUS

## 2023-06-05 MED ORDER — SODIUM CHLORIDE 0.9 % IV SOLN: INTRAVENOUS | Status: AC

## 2023-06-05 MED ORDER — SODIUM CHLORIDE 0.9% FLUSH
10.0000 mL | Freq: Once | INTRAVENOUS | Status: AC
  Administered 2023-06-05: 10 mL via INTRAVENOUS

## 2023-06-05 NOTE — Patient Instructions (Signed)

## 2023-06-08 ENCOUNTER — Other Ambulatory Visit: Payer: Self-pay | Admitting: *Deleted

## 2023-06-08 DIAGNOSIS — R198 Other specified symptoms and signs involving the digestive system and abdomen: Secondary | ICD-10-CM

## 2023-06-09 ENCOUNTER — Inpatient Hospital Stay: Payer: BC Managed Care – PPO

## 2023-06-09 ENCOUNTER — Inpatient Hospital Stay: Payer: BC Managed Care – PPO | Admitting: Nurse Practitioner

## 2023-06-09 ENCOUNTER — Encounter: Payer: Self-pay | Admitting: Nurse Practitioner

## 2023-06-09 VITALS — BP 124/89 | HR 75 | Temp 98.1°F | Resp 18 | Ht 71.0 in | Wt 207.0 lb

## 2023-06-09 VITALS — BP 121/87 | HR 61

## 2023-06-09 DIAGNOSIS — C20 Malignant neoplasm of rectum: Secondary | ICD-10-CM

## 2023-06-09 DIAGNOSIS — E860 Dehydration: Secondary | ICD-10-CM | POA: Diagnosis not present

## 2023-06-09 DIAGNOSIS — M109 Gout, unspecified: Secondary | ICD-10-CM | POA: Diagnosis not present

## 2023-06-09 DIAGNOSIS — Z9221 Personal history of antineoplastic chemotherapy: Secondary | ICD-10-CM | POA: Diagnosis not present

## 2023-06-09 DIAGNOSIS — R198 Other specified symptoms and signs involving the digestive system and abdomen: Secondary | ICD-10-CM

## 2023-06-09 DIAGNOSIS — Z95828 Presence of other vascular implants and grafts: Secondary | ICD-10-CM

## 2023-06-09 DIAGNOSIS — I10 Essential (primary) hypertension: Secondary | ICD-10-CM | POA: Diagnosis not present

## 2023-06-09 DIAGNOSIS — Z923 Personal history of irradiation: Secondary | ICD-10-CM | POA: Diagnosis not present

## 2023-06-09 DIAGNOSIS — Z79899 Other long term (current) drug therapy: Secondary | ICD-10-CM | POA: Diagnosis not present

## 2023-06-09 LAB — CMP (CANCER CENTER ONLY)
ALT: 21 U/L (ref 0–44)
AST: 31 U/L (ref 15–41)
Albumin: 4.2 g/dL (ref 3.5–5.0)
Alkaline Phosphatase: 160 U/L — ABNORMAL HIGH (ref 38–126)
Anion gap: 7 (ref 5–15)
BUN: 20 mg/dL (ref 8–23)
CO2: 28 mmol/L (ref 22–32)
Calcium: 9.4 mg/dL (ref 8.9–10.3)
Chloride: 101 mmol/L (ref 98–111)
Creatinine: 1.69 mg/dL — ABNORMAL HIGH (ref 0.61–1.24)
GFR, Estimated: 46 mL/min — ABNORMAL LOW (ref >60)
Glucose, Bld: 125 mg/dL — ABNORMAL HIGH (ref 70–99)
Potassium: 3.8 mmol/L (ref 3.5–5.1)
Sodium: 136 mmol/L (ref 135–145)
Total Bilirubin: 0.6 mg/dL (ref 0.3–1.2)
Total Protein: 7.1 g/dL (ref 6.5–8.1)

## 2023-06-09 LAB — MAGNESIUM: Magnesium: 1.8 mg/dL (ref 1.7–2.4)

## 2023-06-09 MED ORDER — SODIUM CHLORIDE 0.9 % IV SOLN: INTRAVENOUS | Status: DC

## 2023-06-09 MED ORDER — SODIUM CHLORIDE 0.9% FLUSH
10.0000 mL | Freq: Once | INTRAVENOUS | Status: AC
  Administered 2023-06-09: 10 mL via INTRAVENOUS

## 2023-06-09 MED ORDER — SODIUM CHLORIDE 0.9% FLUSH: 10.0000 mL | Freq: Once | INTRAVENOUS | Status: DC

## 2023-06-09 MED ORDER — HEPARIN SOD (PORK) LOCK FLUSH 100 UNIT/ML IV SOLN
500.0000 [IU] | Freq: Once | INTRAVENOUS | Status: AC
  Administered 2023-06-09: 500 [IU] via INTRAVENOUS

## 2023-06-09 MED ORDER — HEPARIN SOD (PORK) LOCK FLUSH 100 UNIT/ML IV SOLN: 500.0000 [IU] | Freq: Once | INTRAVENOUS | Status: DC

## 2023-06-09 NOTE — Progress Notes (Signed)
[facility] OFF[facility] PROGRESS [facility]   Diagnosis: Rectal cancer  INTERVAL HISTORY:   Kenneth [name] returns as scheduled.  He receives IV fluids twice weekly for high output ileostomy.  He estimates emptying the ostomy bag 10 times a day when it is partially full.  He continues Lomotil/Imodium.  IV fluids twice weekly, every Tuesday and Friday.  Describes appetite as "pretty good".  No nausea or vomiting.  Very occasional pain at the right upper abdomen.  Objective:  Vital signs in last 24 hours:  Blood pressure 124/89, pulse 75, temperature 98.1 F (36.7 C), temperature source Oral, resp. rate 18, height 5\' 11"  (1.803 m), weight 207 lb (93.9 kg), SpO2 98%.    HEENT: No thrush or ulcers. Lymphatics: No palpable cervical, supraclavicular or axillary lymph nodes. Resp: Lungs clear bilaterally. Cardio: Regular rate and rhythm. GI: Nontender.  No hepatosplenomegaly.  Right abdomen ileostomy. Vascular: No leg edema. Skin: Mild decrease in skin turgor. Port-A-Cath without erythema.  Lab Results:  Lab Results  Component Value Date   WBC 7.7 04/10/2023   HGB 15.1 04/10/2023   HCT 46.5 04/10/2023   MCV 85.5 04/10/2023   PLT 194 04/10/2023   NEUTROABS 5.9 04/10/2023    Imaging:  No results found.  Medications: I have reviewed the patient's current medications.  Assessment/Plan: Rectal cancer-mass at 11 cm on proctoscopy by Dr. Byrd Hesselbach 02/04/2022 Colonoscopy 12/26/2021-obstructing mass at the rectosigmoid measured at 15 cm, biopsy invasive moderate to poorly differentiated adenocarcinoma, mismatch repair protein expression intact, MSS, tumor mutation burden 4, RAS wild-type CTs 12/31/2021-masslike circumferential thickening of the distal sigmoid colon with mildly enlarged pericolonic lymph nodes, numerous subcentimeter hypodense liver lesions, splenomegaly MRI abdomen 01/07/2022-innumerable T2 hyperintense liver lesions consistent with cysts, spleen unremarkable, no ascites  or adenopathy, moderate colonic fecal retention, no bowel obstruction MRI pelvis 01/22/2022-T3cN2 tumor above and below the peritoneal reflection, tumor 11.3 cm from the anal verge and 6.1 cm from the sphincter complex, multiple enlarged perirectal nodes, 1.1 cm node versus tumor deposit at the right aspect of the tumor Diverting loop ileostomy 01/15/2022 Cycle 1 FOLFOX 02/17/2022 Cycle 2 FOLFOX 03/04/2022 Cycle 3 FOLFOX held on 03/17/2022 due to neutropenia Cycle 3 FOLFOX 03/17/2022, Udenyca Treatment held 03/31/2022 due to dehydration Cycle 4 FOLFOX 04/07/2022 Cycle 5 FOLFOX 04/22/2022, oxaliplatin dose reduced due to thrombocytopenia Cycle 6 FOLFOX 05/05/2022 Cycle 7 FOLFOX 05/19/2022 Cycle 8 FOLFOX held secondary to patient preference and toxicity (diarrhea/weight loss/neuropathy symptoms) 06/16/2022 radiation/Xeloda-07/24/2022 MRI 08/27/2022-no suspicious lymph nodes; significant reduction in size with near complete resolution of previously identified tumor deposit versus node along the right aspect of the tumor; overall reduction in bulk and signal of mucinous high rectal malignancy, mrTRG-3 10/06/2022-exploratory laparotomy and laparoscopy-carcinomatosis with low-volume peritoneal disease, dominant area adjacent to sigmoid and right lower quadrant, less than 5 mm implants in the right upper quadrant and left lower quadrant.  PCI 5 CTs 11/14/2022-decrease circumferential wall thickening in the upper rectum and decreased mesorectal lymphadenopathy, no evidence of distant metastatic disease, stable mild splenomegaly, focal airspace opacity in the left upper lobe-likely inflammatory Gout Psoriasis Hypertension Left scalene node versus cyst/lipoma on exam 02/03/2022-CT neck 02/10/2022 with 9.5 mm lymph node in the left supraclavicular region posterior to the sternocleidomastoid muscle.  No other prominent lymph nodes in the neck.  Biopsy 02/12/2022 compatible with benign lymph node, negative for metastatic  carcinoma.      Disposition: Kenneth [name] appears stable.  There is no clinical evidence of disease progression.  He continues to have  significant output from the ileostomy.  He will continue Imodium and Lomotil and twice weekly IV fluids.  Tincture of opium was cost prohibitive.  Plan for surveillance imaging prior to next office visit.  He will return for follow-up in 4 weeks.    Lonna Cobb ANP/GNP-BC   06/09/2023  10:09 AM

## 2023-06-09 NOTE — Patient Instructions (Signed)

## 2023-06-12 ENCOUNTER — Inpatient Hospital Stay: Payer: BC Managed Care – PPO

## 2023-06-12 DIAGNOSIS — Z9221 Personal history of antineoplastic chemotherapy: Secondary | ICD-10-CM | POA: Diagnosis not present

## 2023-06-12 DIAGNOSIS — Z923 Personal history of irradiation: Secondary | ICD-10-CM | POA: Diagnosis not present

## 2023-06-12 DIAGNOSIS — M109 Gout, unspecified: Secondary | ICD-10-CM | POA: Diagnosis not present

## 2023-06-12 DIAGNOSIS — Z79899 Other long term (current) drug therapy: Secondary | ICD-10-CM | POA: Diagnosis not present

## 2023-06-12 DIAGNOSIS — C20 Malignant neoplasm of rectum: Secondary | ICD-10-CM | POA: Diagnosis not present

## 2023-06-12 DIAGNOSIS — R198 Other specified symptoms and signs involving the digestive system and abdomen: Secondary | ICD-10-CM

## 2023-06-12 DIAGNOSIS — E860 Dehydration: Secondary | ICD-10-CM | POA: Diagnosis not present

## 2023-06-12 DIAGNOSIS — I10 Essential (primary) hypertension: Secondary | ICD-10-CM | POA: Diagnosis not present

## 2023-06-12 MED ORDER — SODIUM CHLORIDE 0.9% FLUSH
10.0000 mL | INTRAVENOUS | Status: AC | PRN
  Administered 2023-06-12: 10 mL

## 2023-06-12 MED ORDER — SODIUM CHLORIDE 0.9 % IV SOLN: INTRAVENOUS | Status: DC

## 2023-06-12 MED ORDER — HEPARIN SOD (PORK) LOCK FLUSH 100 UNIT/ML IV SOLN
500.0000 [IU] | INTRAVENOUS | Status: AC | PRN
  Administered 2023-06-12: 500 [IU]

## 2023-06-12 NOTE — Patient Instructions (Signed)

## 2023-06-15 ENCOUNTER — Other Ambulatory Visit: Payer: Self-pay | Admitting: *Deleted

## 2023-06-15 DIAGNOSIS — R198 Other specified symptoms and signs involving the digestive system and abdomen: Secondary | ICD-10-CM

## 2023-06-16 ENCOUNTER — Inpatient Hospital Stay: Payer: BC Managed Care – PPO

## 2023-06-16 VITALS — BP 127/87 | HR 66 | Temp 98.2°F | Resp 18 | Wt 209.1 lb

## 2023-06-16 DIAGNOSIS — E860 Dehydration: Secondary | ICD-10-CM

## 2023-06-16 DIAGNOSIS — Z9221 Personal history of antineoplastic chemotherapy: Secondary | ICD-10-CM | POA: Diagnosis not present

## 2023-06-16 DIAGNOSIS — I10 Essential (primary) hypertension: Secondary | ICD-10-CM | POA: Diagnosis not present

## 2023-06-16 DIAGNOSIS — M109 Gout, unspecified: Secondary | ICD-10-CM | POA: Diagnosis not present

## 2023-06-16 DIAGNOSIS — Z923 Personal history of irradiation: Secondary | ICD-10-CM | POA: Diagnosis not present

## 2023-06-16 DIAGNOSIS — C20 Malignant neoplasm of rectum: Secondary | ICD-10-CM | POA: Diagnosis not present

## 2023-06-16 DIAGNOSIS — Z79899 Other long term (current) drug therapy: Secondary | ICD-10-CM | POA: Diagnosis not present

## 2023-06-16 MED ORDER — HEPARIN SOD (PORK) LOCK FLUSH 100 UNIT/ML IV SOLN
500.0000 [IU] | Freq: Once | INTRAVENOUS | Status: AC
  Administered 2023-06-16: 500 [IU] via INTRAVENOUS

## 2023-06-16 MED ORDER — SODIUM CHLORIDE 0.9% FLUSH
10.0000 mL | Freq: Once | INTRAVENOUS | Status: AC
  Administered 2023-06-16: 10 mL via INTRAVENOUS

## 2023-06-16 MED ORDER — SODIUM CHLORIDE 0.9 % IV SOLN: INTRAVENOUS | Status: DC

## 2023-06-16 NOTE — Patient Instructions (Signed)

## 2023-06-17 ENCOUNTER — Other Ambulatory Visit: Payer: Self-pay | Admitting: Nurse Practitioner

## 2023-06-17 DIAGNOSIS — C20 Malignant neoplasm of rectum: Secondary | ICD-10-CM

## 2023-06-19 ENCOUNTER — Inpatient Hospital Stay: Payer: BC Managed Care – PPO | Attending: Oncology

## 2023-06-19 DIAGNOSIS — E860 Dehydration: Secondary | ICD-10-CM | POA: Diagnosis not present

## 2023-06-19 DIAGNOSIS — C20 Malignant neoplasm of rectum: Secondary | ICD-10-CM | POA: Diagnosis not present

## 2023-06-19 DIAGNOSIS — Z9221 Personal history of antineoplastic chemotherapy: Secondary | ICD-10-CM | POA: Insufficient documentation

## 2023-06-19 DIAGNOSIS — R198 Other specified symptoms and signs involving the digestive system and abdomen: Secondary | ICD-10-CM

## 2023-06-19 MED ORDER — HEPARIN SOD (PORK) LOCK FLUSH 100 UNIT/ML IV SOLN
500.0000 [IU] | INTRAVENOUS | Status: AC | PRN
  Administered 2023-06-19: 500 [IU]

## 2023-06-19 MED ORDER — SODIUM CHLORIDE 0.9 % IV SOLN: INTRAVENOUS | Status: AC

## 2023-06-19 MED ORDER — SODIUM CHLORIDE 0.9% FLUSH
10.0000 mL | INTRAVENOUS | Status: AC | PRN
  Administered 2023-06-19: 10 mL

## 2023-06-19 NOTE — Patient Instructions (Signed)

## 2023-06-22 ENCOUNTER — Other Ambulatory Visit: Payer: Self-pay | Admitting: *Deleted

## 2023-06-22 DIAGNOSIS — R198 Other specified symptoms and signs involving the digestive system and abdomen: Secondary | ICD-10-CM

## 2023-06-23 ENCOUNTER — Inpatient Hospital Stay: Payer: BC Managed Care – PPO

## 2023-06-23 DIAGNOSIS — C20 Malignant neoplasm of rectum: Secondary | ICD-10-CM | POA: Diagnosis not present

## 2023-06-23 DIAGNOSIS — Z9221 Personal history of antineoplastic chemotherapy: Secondary | ICD-10-CM | POA: Diagnosis not present

## 2023-06-23 DIAGNOSIS — E860 Dehydration: Secondary | ICD-10-CM | POA: Diagnosis not present

## 2023-06-23 DIAGNOSIS — R198 Other specified symptoms and signs involving the digestive system and abdomen: Secondary | ICD-10-CM

## 2023-06-23 MED ORDER — HEPARIN SOD (PORK) LOCK FLUSH 100 UNIT/ML IV SOLN
500.0000 [IU] | Freq: Once | INTRAVENOUS | Status: AC
  Administered 2023-06-23: 500 [IU] via INTRAVENOUS

## 2023-06-23 MED ORDER — SODIUM CHLORIDE 0.9% FLUSH
10.0000 mL | Freq: Once | INTRAVENOUS | Status: AC
  Administered 2023-06-23: 10 mL via INTRAVENOUS

## 2023-06-23 MED ORDER — SODIUM CHLORIDE 0.9 % IV SOLN: INTRAVENOUS | Status: AC

## 2023-06-23 NOTE — Patient Instructions (Signed)

## 2023-06-25 ENCOUNTER — Encounter: Payer: Self-pay | Admitting: Oncology

## 2023-06-26 ENCOUNTER — Inpatient Hospital Stay: Payer: BC Managed Care – PPO

## 2023-06-26 ENCOUNTER — Other Ambulatory Visit (HOSPITAL_BASED_OUTPATIENT_CLINIC_OR_DEPARTMENT_OTHER): Payer: Self-pay

## 2023-06-26 ENCOUNTER — Encounter: Payer: Self-pay | Admitting: Oncology

## 2023-06-26 DIAGNOSIS — E860 Dehydration: Secondary | ICD-10-CM | POA: Diagnosis not present

## 2023-06-26 DIAGNOSIS — C20 Malignant neoplasm of rectum: Secondary | ICD-10-CM | POA: Diagnosis not present

## 2023-06-26 DIAGNOSIS — R198 Other specified symptoms and signs involving the digestive system and abdomen: Secondary | ICD-10-CM

## 2023-06-26 DIAGNOSIS — Z9221 Personal history of antineoplastic chemotherapy: Secondary | ICD-10-CM | POA: Diagnosis not present

## 2023-06-26 MED ORDER — HEPARIN SOD (PORK) LOCK FLUSH 100 UNIT/ML IV SOLN
500.0000 [IU] | INTRAVENOUS | Status: AC | PRN
  Administered 2023-06-26: 500 [IU]

## 2023-06-26 MED ORDER — SODIUM CHLORIDE 0.9 % IV SOLN: INTRAVENOUS | Status: AC

## 2023-06-26 MED ORDER — SODIUM CHLORIDE 0.9% FLUSH
10.0000 mL | INTRAVENOUS | Status: AC | PRN
  Administered 2023-06-26: 10 mL

## 2023-06-26 NOTE — Patient Instructions (Signed)

## 2023-06-29 ENCOUNTER — Encounter: Payer: Self-pay | Admitting: *Deleted

## 2023-06-29 ENCOUNTER — Other Ambulatory Visit: Payer: Self-pay | Admitting: *Deleted

## 2023-06-29 DIAGNOSIS — R198 Other specified symptoms and signs involving the digestive system and abdomen: Secondary | ICD-10-CM

## 2023-06-29 NOTE — Progress Notes (Signed)
Mr. Husein reports he will be out of town and needs orders for his IVF sent to [facility]. They have agreed to give his fluids that day. Orders/demographics and last office note faxed to [phone]. Company phone #[phone]

## 2023-06-30 ENCOUNTER — Inpatient Hospital Stay: Payer: BC Managed Care – PPO

## 2023-06-30 ENCOUNTER — Encounter: Payer: Self-pay | Admitting: Oncology

## 2023-06-30 ENCOUNTER — Telehealth: Payer: Self-pay | Admitting: Oncology

## 2023-06-30 VITALS — BP 119/82 | HR 64 | Temp 98.1°F | Resp 18 | Ht 71.0 in | Wt 208.3 lb

## 2023-06-30 DIAGNOSIS — Z9221 Personal history of antineoplastic chemotherapy: Secondary | ICD-10-CM | POA: Diagnosis not present

## 2023-06-30 DIAGNOSIS — C20 Malignant neoplasm of rectum: Secondary | ICD-10-CM | POA: Diagnosis not present

## 2023-06-30 DIAGNOSIS — E860 Dehydration: Secondary | ICD-10-CM | POA: Diagnosis not present

## 2023-06-30 DIAGNOSIS — Z95828 Presence of other vascular implants and grafts: Secondary | ICD-10-CM

## 2023-06-30 DIAGNOSIS — R198 Other specified symptoms and signs involving the digestive system and abdomen: Secondary | ICD-10-CM

## 2023-06-30 MED ORDER — SODIUM CHLORIDE 0.9% FLUSH
10.0000 mL | Freq: Once | INTRAVENOUS | Status: AC
  Administered 2023-06-30: 10 mL via INTRAVENOUS

## 2023-06-30 MED ORDER — HEPARIN SOD (PORK) LOCK FLUSH 100 UNIT/ML IV SOLN
500.0000 [IU] | Freq: Once | INTRAVENOUS | Status: AC
  Administered 2023-06-30: 500 [IU] via INTRAVENOUS

## 2023-06-30 MED ORDER — SODIUM CHLORIDE 0.9 % IV SOLN: INTRAVENOUS | Status: AC

## 2023-06-30 NOTE — Patient Instructions (Signed)

## 2023-07-01 ENCOUNTER — Encounter: Payer: Self-pay | Admitting: Oncology

## 2023-07-02 ENCOUNTER — Ambulatory Visit (HOSPITAL_BASED_OUTPATIENT_CLINIC_OR_DEPARTMENT_OTHER)
Admission: RE | Admit: 2023-07-02 | Discharge: 2023-07-02 | Disposition: A | Discharge status: Home / Self Care | Payer: BC Managed Care – PPO | Source: Ambulatory Visit | Attending: Nurse Practitioner | Admitting: Nurse Practitioner

## 2023-07-02 DIAGNOSIS — C20 Malignant neoplasm of rectum: Secondary | ICD-10-CM | POA: Insufficient documentation

## 2023-07-02 DIAGNOSIS — K8020 Calculus of gallbladder without cholecystitis without obstruction: Secondary | ICD-10-CM | POA: Diagnosis not present

## 2023-07-02 DIAGNOSIS — R932 Abnormal findings on diagnostic imaging of liver and biliary tract: Secondary | ICD-10-CM | POA: Diagnosis not present

## 2023-07-03 ENCOUNTER — Inpatient Hospital Stay: Payer: BC Managed Care – PPO

## 2023-07-03 DIAGNOSIS — C20 Malignant neoplasm of rectum: Secondary | ICD-10-CM | POA: Diagnosis not present

## 2023-07-03 DIAGNOSIS — E860 Dehydration: Secondary | ICD-10-CM | POA: Diagnosis not present

## 2023-07-03 DIAGNOSIS — R198 Other specified symptoms and signs involving the digestive system and abdomen: Secondary | ICD-10-CM

## 2023-07-03 DIAGNOSIS — Z9221 Personal history of antineoplastic chemotherapy: Secondary | ICD-10-CM | POA: Diagnosis not present

## 2023-07-03 MED ORDER — SODIUM CHLORIDE 0.9 % IV SOLN: INTRAVENOUS | Status: AC

## 2023-07-03 MED ORDER — HEPARIN SOD (PORK) LOCK FLUSH 100 UNIT/ML IV SOLN
500.0000 [IU] | INTRAVENOUS | Status: AC | PRN
  Administered 2023-07-03: 500 [IU]

## 2023-07-03 MED ORDER — SODIUM CHLORIDE 0.9% FLUSH
10.0000 mL | INTRAVENOUS | Status: AC | PRN
  Administered 2023-07-03: 10 mL

## 2023-07-03 NOTE — Patient Instructions (Signed)

## 2023-07-06 ENCOUNTER — Other Ambulatory Visit: Payer: Self-pay | Admitting: *Deleted

## 2023-07-06 DIAGNOSIS — R198 Other specified symptoms and signs involving the digestive system and abdomen: Secondary | ICD-10-CM

## 2023-07-07 ENCOUNTER — Inpatient Hospital Stay: Payer: BC Managed Care – PPO

## 2023-07-07 ENCOUNTER — Inpatient Hospital Stay: Payer: BC Managed Care – PPO | Admitting: Oncology

## 2023-07-10 ENCOUNTER — Inpatient Hospital Stay: Payer: BC Managed Care – PPO

## 2023-07-10 VITALS — BP 120/83 | HR 62 | Temp 97.7°F | Resp 18

## 2023-07-10 DIAGNOSIS — R198 Other specified symptoms and signs involving the digestive system and abdomen: Secondary | ICD-10-CM

## 2023-07-10 DIAGNOSIS — Z95828 Presence of other vascular implants and grafts: Secondary | ICD-10-CM

## 2023-07-10 DIAGNOSIS — C20 Malignant neoplasm of rectum: Secondary | ICD-10-CM | POA: Diagnosis not present

## 2023-07-10 DIAGNOSIS — E860 Dehydration: Secondary | ICD-10-CM | POA: Diagnosis not present

## 2023-07-10 DIAGNOSIS — Z9221 Personal history of antineoplastic chemotherapy: Secondary | ICD-10-CM | POA: Diagnosis not present

## 2023-07-10 MED ORDER — HEPARIN SOD (PORK) LOCK FLUSH 100 UNIT/ML IV SOLN
500.0000 [IU] | Freq: Once | INTRAVENOUS | Status: AC
  Administered 2023-07-10: 500 [IU] via INTRAVENOUS

## 2023-07-10 MED ORDER — SODIUM CHLORIDE 0.9 % IV SOLN: INTRAVENOUS | Status: AC

## 2023-07-10 MED ORDER — SODIUM CHLORIDE 0.9% FLUSH
10.0000 mL | Freq: Once | INTRAVENOUS | Status: AC
  Administered 2023-07-10: 10 mL via INTRAVENOUS

## 2023-07-10 NOTE — Patient Instructions (Signed)

## 2023-07-13 ENCOUNTER — Other Ambulatory Visit: Payer: Self-pay | Admitting: *Deleted

## 2023-07-13 DIAGNOSIS — R198 Other specified symptoms and signs involving the digestive system and abdomen: Secondary | ICD-10-CM

## 2023-07-14 ENCOUNTER — Inpatient Hospital Stay: Payer: BC Managed Care – PPO

## 2023-07-14 VITALS — BP 130/85 | HR 61 | Temp 98.1°F | Ht 71.0 in | Wt 204.8 lb

## 2023-07-14 DIAGNOSIS — E860 Dehydration: Secondary | ICD-10-CM | POA: Diagnosis not present

## 2023-07-14 DIAGNOSIS — Z9221 Personal history of antineoplastic chemotherapy: Secondary | ICD-10-CM | POA: Diagnosis not present

## 2023-07-14 DIAGNOSIS — R198 Other specified symptoms and signs involving the digestive system and abdomen: Secondary | ICD-10-CM

## 2023-07-14 DIAGNOSIS — Z95828 Presence of other vascular implants and grafts: Secondary | ICD-10-CM

## 2023-07-14 DIAGNOSIS — C20 Malignant neoplasm of rectum: Secondary | ICD-10-CM | POA: Diagnosis not present

## 2023-07-14 MED ORDER — HEPARIN SOD (PORK) LOCK FLUSH 100 UNIT/ML IV SOLN
500.0000 [IU] | Freq: Once | INTRAVENOUS | Status: AC
  Administered 2023-07-14: 500 [IU] via INTRAVENOUS

## 2023-07-14 MED ORDER — SODIUM CHLORIDE 0.9 % IV SOLN: INTRAVENOUS | Status: DC

## 2023-07-14 MED ORDER — SODIUM CHLORIDE 0.9% FLUSH
10.0000 mL | Freq: Once | INTRAVENOUS | Status: AC
  Administered 2023-07-14: 10 mL via INTRAVENOUS

## 2023-07-14 NOTE — Patient Instructions (Signed)

## 2023-07-16 NOTE — Progress Notes (Deleted)
Rectal cancer-mass at 11 cm on proctoscopy by Dr. Byrd Hesselbach 02/04/2022 Colonoscopy 12/26/2021-obstructing mass at the rectosigmoid measured at 15 cm, biopsy invasive moderate to poorly differentiated adenocarcinoma, mismatch repair protein expression intact, MSS, tumor mutation burden 4, RAS wild-type CTs 12/31/2021-masslike circumferential thickening of the distal sigmoid colon with mildly enlarged pericolonic lymph nodes, numerous subcentimeter hypodense liver lesions, splenomegaly MRI abdomen 01/07/2022-innumerable T2 hyperintense liver lesions consistent with cysts, spleen unremarkable, no ascites or adenopathy, moderate colonic fecal retention, no bowel obstruction MRI pelvis 01/22/2022-T3cN2 tumor above and below the peritoneal reflection, tumor 11.3 cm from the anal verge and 6.1 cm from the sphincter complex, multiple enlarged perirectal nodes, 1.1 cm node versus tumor deposit at the right aspect of the tumor Diverting loop ileostomy 01/15/2022 Cycle 1 FOLFOX 02/17/2022 Cycle 2 FOLFOX 03/04/2022 Cycle 3 FOLFOX held on 03/17/2022 due to neutropenia Cycle 3 FOLFOX 03/17/2022, Udenyca Treatment held 03/31/2022 due to dehydration Cycle 4 FOLFOX 04/07/2022 Cycle 5 FOLFOX 04/22/2022, oxaliplatin dose reduced due to thrombocytopenia Cycle 6 FOLFOX 05/05/2022 Cycle 7 FOLFOX 05/19/2022 Cycle 8 FOLFOX held secondary to patient preference and toxicity (diarrhea/weight loss/neuropathy symptoms) 06/16/2022 radiation/Xeloda-07/24/2022 MRI 08/27/2022-no suspicious lymph nodes; significant reduction in size with near complete resolution of previously identified tumor deposit versus node along the right aspect of the tumor; overall reduction in bulk and signal of mucinous high rectal malignancy, mrTRG-3 10/06/2022-exploratory laparotomy and laparoscopy-carcinomatosis with low-volume peritoneal disease, dominant area adjacent to sigmoid and right lower quadrant, less than 5 mm implants in the right upper quadrant and left lower  quadrant.  PCI 5 CTs 11/14/2022-decrease circumferential wall thickening in the upper rectum and decreased mesorectal lymphadenopathy, no evidence of distant metastatic disease, stable mild splenomegaly, focal airspace opacity in the left upper lobe-likely inflammatory Gout Psoriasis Hypertension Left scalene node versus cyst/lipoma on exam 02/03/2022-CT neck 02/10/2022 with 9.5 mm lymph node in the left supraclavicular region posterior to the sternocleidomastoid muscle.  No other prominent lymph nodes in the neck.  Biopsy 02/12/2022 compatible with benign lymph node, negative for metastatic carcinoma.

## 2023-07-17 ENCOUNTER — Inpatient Hospital Stay: Payer: BC Managed Care – PPO

## 2023-07-17 ENCOUNTER — Other Ambulatory Visit: Payer: Self-pay | Admitting: *Deleted

## 2023-07-17 ENCOUNTER — Inpatient Hospital Stay: Payer: BC Managed Care – PPO | Admitting: Oncology

## 2023-07-17 ENCOUNTER — Other Ambulatory Visit: Payer: Self-pay

## 2023-07-17 VITALS — BP 131/87 | HR 76 | Temp 98.2°F | Resp 18 | Ht 71.0 in | Wt 202.5 lb

## 2023-07-17 DIAGNOSIS — E860 Dehydration: Secondary | ICD-10-CM | POA: Diagnosis not present

## 2023-07-17 DIAGNOSIS — R198 Other specified symptoms and signs involving the digestive system and abdomen: Secondary | ICD-10-CM

## 2023-07-17 DIAGNOSIS — C20 Malignant neoplasm of rectum: Secondary | ICD-10-CM

## 2023-07-17 DIAGNOSIS — Z9221 Personal history of antineoplastic chemotherapy: Secondary | ICD-10-CM | POA: Diagnosis not present

## 2023-07-17 LAB — CBC WITH DIFFERENTIAL (CANCER CENTER ONLY)
Abs Immature Granulocytes: 0.01 10*3/uL (ref 0.00–0.07)
Basophils Absolute: 0.1 10*3/uL (ref 0.0–0.1)
Basophils Relative: 1 %
Eosinophils Absolute: 0.3 10*3/uL (ref 0.0–0.5)
Eosinophils Relative: 4 %
HCT: 46.9 % (ref 39.0–52.0)
Hemoglobin: 15.6 g/dL (ref 13.0–17.0)
Immature Granulocytes: 0 %
Lymphocytes Relative: 13 %
Lymphs Abs: 0.9 10*3/uL (ref 0.7–4.0)
MCH: 28.4 pg (ref 26.0–34.0)
MCHC: 33.3 g/dL (ref 30.0–36.0)
MCV: 85.4 fL (ref 80.0–100.0)
Monocytes Absolute: 0.5 10*3/uL (ref 0.1–1.0)
Monocytes Relative: 8 %
Neutro Abs: 4.7 10*3/uL (ref 1.7–7.7)
Neutrophils Relative %: 74 %
Platelet Count: 181 10*3/uL (ref 150–400)
RBC: 5.49 MIL/uL (ref 4.22–5.81)
RDW: 13.9 % (ref 11.5–15.5)
WBC Count: 6.4 10*3/uL (ref 4.0–10.5)
nRBC: 0 % (ref 0.0–0.2)

## 2023-07-17 LAB — CMP (CANCER CENTER ONLY)
ALT: 19 U/L (ref 0–44)
AST: 29 U/L (ref 15–41)
Albumin: 3.9 g/dL (ref 3.5–5.0)
Alkaline Phosphatase: 157 U/L — ABNORMAL HIGH (ref 38–126)
Anion gap: 7 (ref 5–15)
BUN: 20 mg/dL (ref 8–23)
CO2: 29 mmol/L (ref 22–32)
Calcium: 9.2 mg/dL (ref 8.9–10.3)
Chloride: 102 mmol/L (ref 98–111)
Creatinine: 1.63 mg/dL — ABNORMAL HIGH (ref 0.61–1.24)
GFR, Estimated: 48 mL/min — ABNORMAL LOW (ref >60)
Glucose, Bld: 109 mg/dL — ABNORMAL HIGH (ref 70–99)
Potassium: 3.9 mmol/L (ref 3.5–5.1)
Sodium: 138 mmol/L (ref 135–145)
Total Bilirubin: 0.7 mg/dL (ref 0.3–1.2)
Total Protein: 6.9 g/dL (ref 6.5–8.1)

## 2023-07-17 LAB — MAGNESIUM: Magnesium: 1.8 mg/dL (ref 1.7–2.4)

## 2023-07-17 LAB — CEA (ACCESS): CEA (CHCC): 5.21 ng/mL — ABNORMAL HIGH (ref 0.00–5.00)

## 2023-07-17 MED ORDER — HEPARIN SOD (PORK) LOCK FLUSH 100 UNIT/ML IV SOLN
500.0000 [IU] | INTRAVENOUS | Status: AC | PRN
  Administered 2023-07-17: 500 [IU]

## 2023-07-17 MED ORDER — SODIUM CHLORIDE 0.9% FLUSH
10.0000 mL | INTRAVENOUS | Status: AC | PRN
  Administered 2023-07-17: 10 mL

## 2023-07-17 MED ORDER — SODIUM CHLORIDE 0.9 % IV SOLN: INTRAVENOUS | Status: AC

## 2023-07-17 NOTE — Patient Instructions (Signed)

## 2023-07-17 NOTE — Progress Notes (Signed)
[facility] OFFICE PROGRESS NOTE   Diagnosis: Rectal cancer  INTERVAL HISTORY:   Mr. Maley returns as scheduled.  He reports improvement in abdominal discomfort since he decreased fluid intake while eating.  He tolerates fluid well at other times.  He remains active with exercise.  Objective:  Vital signs in last 24 hours:  Blood pressure 131/87, pulse 76, temperature 98.2 F (36.8 C), temperature source Oral, resp. rate 18, height 5\' 11"  (1.803 m), weight 202 lb 8 oz (91.9 kg), SpO2 99%.    HEENT: The mucous membranes are moist, no thrush Resp: Lungs clear bilaterally Cardio: Regular rate and rhythm GI: Right abdomen ileostomy, nontender, mild firm fullness in the left upper abdomen without a discrete mass Vascular: No leg edema   Portacath/PICC-without erythema  Lab Results:  Lab Results  Component Value Date   WBC 6.4 07/17/2023   HGB 15.6 07/17/2023   HCT 46.9 07/17/2023   MCV 85.4 07/17/2023   PLT 181 07/17/2023   NEUTROABS 4.7 07/17/2023    CMP  Lab Results  Component Value Date   NA 138 07/17/2023   K 3.9 07/17/2023   CL 102 07/17/2023   CO2 29 07/17/2023   GLUCOSE 109 (H) 07/17/2023   BUN 20 07/17/2023   CREATININE 1.63 (H) 07/17/2023   CALCIUM 9.2 07/17/2023   PROT 6.9 07/17/2023   ALBUMIN 3.9 07/17/2023   AST 29 07/17/2023   ALT 19 07/17/2023   ALKPHOS 157 (H) 07/17/2023   BILITOT 0.7 07/17/2023   GFRNONAA 48 (L) 07/17/2023   GFRAA 84 03/17/2008    Lab Results  Component Value Date   CEA 5.21 (H) 07/17/2023    Medications: I have reviewed the patient's current medications.   Assessment/Plan: Rectal cancer-mass at 11 cm on proctoscopy by Dr. Byrd Hesselbach 02/04/2022 Colonoscopy 12/26/2021-obstructing mass at the rectosigmoid measured at 15 cm, biopsy invasive moderate to poorly differentiated adenocarcinoma, mismatch repair protein expression intact, MSS, tumor mutation burden 4, RAS wild-type CTs 12/31/2021-masslike  circumferential thickening of the distal sigmoid colon with mildly enlarged pericolonic lymph nodes, numerous subcentimeter hypodense liver lesions, splenomegaly MRI abdomen 01/07/2022-innumerable T2 hyperintense liver lesions consistent with cysts, spleen unremarkable, no ascites or adenopathy, moderate colonic fecal retention, no bowel obstruction MRI pelvis 01/22/2022-T3cN2 tumor above and below the peritoneal reflection, tumor 11.3 cm from the anal verge and 6.1 cm from the sphincter complex, multiple enlarged perirectal nodes, 1.1 cm node versus tumor deposit at the right aspect of the tumor Diverting loop ileostomy 01/15/2022 Cycle 1 FOLFOX 02/17/2022 Cycle 2 FOLFOX 03/04/2022 Cycle 3 FOLFOX held on 03/17/2022 due to neutropenia Cycle 3 FOLFOX 03/17/2022, Udenyca Treatment held 03/31/2022 due to dehydration Cycle 4 FOLFOX 04/07/2022 Cycle 5 FOLFOX 04/22/2022, oxaliplatin dose reduced due to thrombocytopenia Cycle 6 FOLFOX 05/05/2022 Cycle 7 FOLFOX 05/19/2022 Cycle 8 FOLFOX held secondary to patient preference and toxicity (diarrhea/weight loss/neuropathy symptoms) 06/16/2022 radiation/Xeloda-07/24/2022 MRI 08/27/2022-no suspicious lymph nodes; significant reduction in size with near complete resolution of previously identified tumor deposit versus node along the right aspect of the tumor; overall reduction in bulk and signal of mucinous high rectal malignancy, mrTRG-3 10/06/2022-exploratory laparotomy and laparoscopy-carcinomatosis with low-volume peritoneal disease, dominant area adjacent to sigmoid and right lower quadrant, less than 5 mm implants in the right upper quadrant and left lower quadrant.  PCI 5 CTs 11/14/2022-decrease circumferential wall thickening in the upper rectum and decreased mesorectal lymphadenopathy, no evidence of distant metastatic disease, stable mild splenomegaly, focal airspace opacity in the left upper lobe-likely inflammatory CTs 07/02/2023-extensive  omental caking and peritoneal  tumor, mass in the lesser sac, circumferential wall thickening in the rectum Gout Psoriasis Hypertension Left scalene node versus cyst/lipoma on exam 02/03/2022-CT neck 02/10/2022 with 9.5 mm lymph node in the left supraclavicular region posterior to the sternocleidomastoid muscle.  No other prominent lymph nodes in the neck.  Biopsy 02/12/2022 compatible with benign lymph node, negative for metastatic carcinoma.       Disposition: Kenneth [name] has metastatic rectal cancer.  He continues to receive intravenous fluids for a high output ileostomy.  I reviewed the restaging CT findings and images with him.  We discussed treatment options.  I recommend FOLFIRI/panitumumab.  We also discussed comfort care and 5-FU/panitumumab.  We reviewed potential toxicities associated with 5-FU, irinotecan, and panitumumab.  We discussed the nausea and acute/delayed diarrhea associated with irinotecan.  We discussed the skin rash and diarrhea associated with panitumumab.  He understands the increased risk of dehydration with this regimen given his underlying high output ileostomy.   Mr. Niccoli understands no therapy will be curative.  We discussed the expected prognosis with and without chemotherapy.  He is undecided on beginning a trial of systemic therapy.  He will continue twice weekly intravenous fluids.  He will return for an office visit and further discussion in 2 weeks.  Thornton Papas, MD  07/17/2023  9:36 AM

## 2023-07-21 ENCOUNTER — Inpatient Hospital Stay: Payer: BC Managed Care – PPO | Attending: Oncology

## 2023-07-21 DIAGNOSIS — Z9221 Personal history of antineoplastic chemotherapy: Secondary | ICD-10-CM | POA: Insufficient documentation

## 2023-07-21 DIAGNOSIS — R198 Other specified symptoms and signs involving the digestive system and abdomen: Secondary | ICD-10-CM

## 2023-07-21 DIAGNOSIS — C20 Malignant neoplasm of rectum: Secondary | ICD-10-CM | POA: Insufficient documentation

## 2023-07-21 DIAGNOSIS — E860 Dehydration: Secondary | ICD-10-CM | POA: Diagnosis not present

## 2023-07-21 MED ORDER — SODIUM CHLORIDE 0.9 % IV SOLN: INTRAVENOUS | Status: AC

## 2023-07-21 MED ORDER — HEPARIN SOD (PORK) LOCK FLUSH 100 UNIT/ML IV SOLN
500.0000 [IU] | INTRAVENOUS | Status: AC | PRN
  Administered 2023-07-21: 500 [IU]

## 2023-07-21 MED ORDER — SODIUM CHLORIDE 0.9% FLUSH
10.0000 mL | INTRAVENOUS | Status: AC | PRN
  Administered 2023-07-21: 10 mL

## 2023-07-21 NOTE — Patient Instructions (Signed)

## 2023-07-24 ENCOUNTER — Inpatient Hospital Stay: Payer: BC Managed Care – PPO

## 2023-07-24 ENCOUNTER — Other Ambulatory Visit: Payer: Self-pay | Admitting: *Deleted

## 2023-07-24 VITALS — BP 113/90 | HR 66 | Temp 98.1°F | Resp 18 | Wt 202.2 lb

## 2023-07-24 DIAGNOSIS — R198 Other specified symptoms and signs involving the digestive system and abdomen: Secondary | ICD-10-CM

## 2023-07-24 DIAGNOSIS — Z9221 Personal history of antineoplastic chemotherapy: Secondary | ICD-10-CM | POA: Diagnosis not present

## 2023-07-24 DIAGNOSIS — E860 Dehydration: Secondary | ICD-10-CM | POA: Diagnosis not present

## 2023-07-24 DIAGNOSIS — C20 Malignant neoplasm of rectum: Secondary | ICD-10-CM | POA: Diagnosis not present

## 2023-07-24 DIAGNOSIS — Z95828 Presence of other vascular implants and grafts: Secondary | ICD-10-CM

## 2023-07-24 MED ORDER — SODIUM CHLORIDE 0.9 % IV SOLN: INTRAVENOUS | Status: AC

## 2023-07-24 MED ORDER — SODIUM CHLORIDE 0.9% FLUSH
10.0000 mL | Freq: Once | INTRAVENOUS | Status: AC
  Administered 2023-07-24: 10 mL via INTRAVENOUS

## 2023-07-24 MED ORDER — HEPARIN SOD (PORK) LOCK FLUSH 100 UNIT/ML IV SOLN
500.0000 [IU] | Freq: Once | INTRAVENOUS | Status: AC
  Administered 2023-07-24: 500 [IU] via INTRAVENOUS

## 2023-07-24 NOTE — Patient Instructions (Signed)

## 2023-07-28 ENCOUNTER — Encounter: Payer: Self-pay | Admitting: Oncology

## 2023-07-28 ENCOUNTER — Inpatient Hospital Stay: Payer: BC Managed Care – PPO

## 2023-07-28 VITALS — BP 118/84 | HR 66 | Temp 97.9°F | Resp 18 | Ht 71.0 in | Wt 202.3 lb

## 2023-07-28 DIAGNOSIS — R198 Other specified symptoms and signs involving the digestive system and abdomen: Secondary | ICD-10-CM

## 2023-07-28 DIAGNOSIS — Z9221 Personal history of antineoplastic chemotherapy: Secondary | ICD-10-CM | POA: Diagnosis not present

## 2023-07-28 DIAGNOSIS — Z95828 Presence of other vascular implants and grafts: Secondary | ICD-10-CM

## 2023-07-28 DIAGNOSIS — C20 Malignant neoplasm of rectum: Secondary | ICD-10-CM | POA: Diagnosis not present

## 2023-07-28 DIAGNOSIS — E860 Dehydration: Secondary | ICD-10-CM | POA: Diagnosis not present

## 2023-07-28 MED ORDER — SODIUM CHLORIDE 0.9 % IV SOLN: INTRAVENOUS | Status: AC

## 2023-07-28 MED ORDER — SODIUM CHLORIDE 0.9% FLUSH
10.0000 mL | Freq: Once | INTRAVENOUS | Status: AC
  Administered 2023-07-28: 10 mL via INTRAVENOUS

## 2023-07-28 MED ORDER — HEPARIN SOD (PORK) LOCK FLUSH 100 UNIT/ML IV SOLN
500.0000 [IU] | Freq: Once | INTRAVENOUS | Status: AC
  Administered 2023-07-28: 500 [IU] via INTRAVENOUS

## 2023-07-28 NOTE — Patient Instructions (Signed)

## 2023-07-30 ENCOUNTER — Other Ambulatory Visit (HOSPITAL_BASED_OUTPATIENT_CLINIC_OR_DEPARTMENT_OTHER): Payer: Self-pay

## 2023-07-30 ENCOUNTER — Other Ambulatory Visit: Payer: Self-pay

## 2023-07-30 ENCOUNTER — Other Ambulatory Visit: Payer: Self-pay | Admitting: Oncology

## 2023-07-30 DIAGNOSIS — C20 Malignant neoplasm of rectum: Secondary | ICD-10-CM

## 2023-07-30 MED ORDER — DIPHENOXYLATE-ATROPINE 2.5-0.025 MG PO TABS
2.0000 | ORAL_TABLET | Freq: Four times a day (QID) | ORAL | 2 refills | Status: DC | PRN
  Filled 2023-07-30: qty 240, 30d supply, fill #0
  Filled 2023-08-21 – 2023-08-31 (×2): qty 240, 30d supply, fill #1
  Filled 2023-09-28 – 2023-09-29 (×2): qty 240, 30d supply, fill #2

## 2023-07-31 ENCOUNTER — Inpatient Hospital Stay: Payer: BC Managed Care – PPO | Admitting: Oncology

## 2023-07-31 ENCOUNTER — Inpatient Hospital Stay: Payer: BC Managed Care – PPO

## 2023-07-31 ENCOUNTER — Other Ambulatory Visit (HOSPITAL_BASED_OUTPATIENT_CLINIC_OR_DEPARTMENT_OTHER): Payer: Self-pay

## 2023-07-31 ENCOUNTER — Other Ambulatory Visit: Payer: Self-pay | Admitting: *Deleted

## 2023-07-31 VITALS — BP 126/88 | HR 72 | Resp 18

## 2023-07-31 DIAGNOSIS — Z9221 Personal history of antineoplastic chemotherapy: Secondary | ICD-10-CM | POA: Diagnosis not present

## 2023-07-31 DIAGNOSIS — E860 Dehydration: Secondary | ICD-10-CM | POA: Diagnosis not present

## 2023-07-31 DIAGNOSIS — C20 Malignant neoplasm of rectum: Secondary | ICD-10-CM | POA: Diagnosis not present

## 2023-07-31 DIAGNOSIS — R198 Other specified symptoms and signs involving the digestive system and abdomen: Secondary | ICD-10-CM

## 2023-07-31 DIAGNOSIS — Z95828 Presence of other vascular implants and grafts: Secondary | ICD-10-CM

## 2023-07-31 LAB — CMP (CANCER CENTER ONLY)
ALT: 19 U/L (ref 0–44)
AST: 28 U/L (ref 15–41)
Albumin: 3.9 g/dL (ref 3.5–5.0)
Alkaline Phosphatase: 137 U/L — ABNORMAL HIGH (ref 38–126)
Anion gap: 8 (ref 5–15)
BUN: 19 mg/dL (ref 8–23)
CO2: 29 mmol/L (ref 22–32)
Calcium: 9 mg/dL (ref 8.9–10.3)
Chloride: 101 mmol/L (ref 98–111)
Creatinine: 1.58 mg/dL — ABNORMAL HIGH (ref 0.61–1.24)
GFR, Estimated: 49 mL/min — ABNORMAL LOW (ref >60)
Glucose, Bld: 147 mg/dL — ABNORMAL HIGH (ref 70–99)
Potassium: 3.8 mmol/L (ref 3.5–5.1)
Sodium: 138 mmol/L (ref 135–145)
Total Bilirubin: 0.6 mg/dL (ref 0.3–1.2)
Total Protein: 6.8 g/dL (ref 6.5–8.1)

## 2023-07-31 LAB — CEA (ACCESS): CEA (CHCC): 5.5 ng/mL — ABNORMAL HIGH (ref 0.00–5.00)

## 2023-07-31 MED ORDER — SODIUM CHLORIDE 0.9 % IV SOLN: INTRAVENOUS | Status: AC

## 2023-07-31 MED ORDER — SODIUM CHLORIDE 0.9% FLUSH
10.0000 mL | Freq: Once | INTRAVENOUS | Status: AC
  Administered 2023-07-31: 10 mL

## 2023-07-31 MED ORDER — HEPARIN SOD (PORK) LOCK FLUSH 100 UNIT/ML IV SOLN
500.0000 [IU] | Freq: Once | INTRAVENOUS | Status: AC
  Administered 2023-07-31: 500 [IU]

## 2023-07-31 NOTE — Patient Instructions (Signed)

## 2023-07-31 NOTE — Patient Instructions (Signed)
Implanted [facility] Guide An implanted port is a device that is placed under the skin. It is usually placed in the chest. The device may vary based on the need. Implanted ports can be used to give IV medicine, to take blood, or to give fluids. You may have an implanted port if: You need IV medicine that would be irritating to the small veins in your hands or arms. You need IV medicines, such as chemotherapy, for a long period of time. You need IV nutrition for a long period of time. You may have fewer limitations when using a port than you would if you used other types of long-term IVs. You will also likely be able to return to normal activities after your incision heals. An implanted port has two main parts: Reservoir. The reservoir is the part where a needle is inserted to give medicines or draw blood. The reservoir is round. After the port is placed, it appears as a small, raised area under your skin. Catheter. The catheter is a small, thin tube that connects the reservoir to a vein. Medicine that is inserted into the reservoir goes into the catheter and then into the vein. How is my port accessed? To access your port: A numbing cream may be placed on the skin over the port site. Your health care provider will put on a mask and sterile gloves. The skin over your port will be cleaned carefully with a germ-killing soap and allowed to dry. Your health care provider will gently pinch the port and insert a needle into it. Your health care provider will check for a blood return to make sure the port is in the vein and is still working (patent). If your port needs to remain accessed to get medicine continuously (constant infusion), your health care provider will place a clear bandage (dressing) over the needle site. The dressing and needle will need to be changed every week, or as told by your health care provider. What is flushing? Flushing helps keep the port working. Follow instructions from your  health care provider about how and when to flush the port. Ports are usually flushed with saline solution or a medicine called heparin. The need for flushing will depend on how the port is used: If the port is only used from time to time to give medicines or draw blood, the port may need to be flushed: Before and after medicines have been given. Before and after blood has been drawn. As part of routine maintenance. Flushing may be recommended every 4-6 weeks. If a constant infusion is running, the port may not need to be flushed. Throw away any syringes in a disposal container that is meant for sharp items (sharps container). You can buy a sharps container from a pharmacy, or you can make one by using an empty hard plastic bottle with a cover. How long will my port stay implanted? The port can stay in for as long as your health care provider thinks it is needed. When it is time for the port to come out, a surgery will be done to remove it. The surgery will be similar to the procedure that was done to put the port in. Follow these instructions at home: Caring for your port and port site Flush your port as told by your health care provider. If you need an infusion over several days, follow instructions from your health care provider about how to take care of your port site. Make sure you: Change your  dressing as told by your health care provider. Wash your hands with soap and water for at least 20 seconds before and after you change your dressing. If soap and water are not available, use alcohol-based hand sanitizer. Place any used dressings or infusion bags into a plastic bag. Throw that bag in the trash. Keep the dressing that covers the needle clean and dry. Do not get it wet. Do not use scissors or sharp objects near the infusion tubing. Keep any external tubes clamped, unless they are being used. Check your port site every day for signs of infection. Check for: Redness, swelling, or  pain. Fluid or blood. Warmth. Pus or a bad smell. Protect the skin around the port site. Avoid wearing bra straps that rub or irritate the site. Protect the skin around your port from seat belts. Place a soft pad over your chest if needed. Bathe or shower as told by your health care provider. The site may get wet as long as you are not actively receiving an infusion. General instructions  Return to your normal activities as told by your health care provider. Ask your health care provider what activities are safe for you. Carry a medical alert card or wear a medical alert bracelet at all times. This will let health care providers know that you have an implanted port in case of an emergency. Where to find more information American Cancer Society: www.cancer.org American Society of Clinical Oncology: www.cancer.net Contact a health care provider if: You have a fever or chills. You have redness, swelling, or pain at the port site. You have fluid or blood coming from your port site. Your incision feels warm to the touch. You have pus or a bad smell coming from the port site. Summary Implanted ports are usually placed in the chest for long-term IV access. Follow instructions from your health care provider about flushing the port and changing bandages (dressings). Take care of the area around your port by avoiding clothing that puts pressure on the area, and by watching for signs of infection. Protect the skin around your port from seat belts. Place a soft pad over your chest if needed. Contact a health care provider if you have a fever or you have redness, swelling, pain, fluid, or a bad smell at the port site. This information is not intended to replace advice given to you by your health care provider. Make sure you discuss any questions you have with your health care provider. Document Revised: 05/07/2021 Document Reviewed: 05/07/2021 Elsevier Patient Education  2024 [employer].

## 2023-08-04 ENCOUNTER — Inpatient Hospital Stay: Payer: BC Managed Care – PPO

## 2023-08-04 VITALS — BP 118/84 | HR 68 | Temp 98.3°F | Resp 18 | Ht 71.0 in | Wt 205.2 lb

## 2023-08-04 DIAGNOSIS — Z9221 Personal history of antineoplastic chemotherapy: Secondary | ICD-10-CM | POA: Diagnosis not present

## 2023-08-04 DIAGNOSIS — C20 Malignant neoplasm of rectum: Secondary | ICD-10-CM | POA: Diagnosis not present

## 2023-08-04 DIAGNOSIS — R198 Other specified symptoms and signs involving the digestive system and abdomen: Secondary | ICD-10-CM

## 2023-08-04 DIAGNOSIS — E860 Dehydration: Secondary | ICD-10-CM | POA: Diagnosis not present

## 2023-08-04 DIAGNOSIS — Z95828 Presence of other vascular implants and grafts: Secondary | ICD-10-CM

## 2023-08-04 MED ORDER — SODIUM CHLORIDE 0.9% FLUSH
10.0000 mL | Freq: Once | INTRAVENOUS | Status: AC
  Administered 2023-08-04: 10 mL via INTRAVENOUS

## 2023-08-04 MED ORDER — SODIUM CHLORIDE 0.9 % IV SOLN: INTRAVENOUS | Status: AC

## 2023-08-04 MED ORDER — HEPARIN SOD (PORK) LOCK FLUSH 100 UNIT/ML IV SOLN
500.0000 [IU] | Freq: Once | INTRAVENOUS | Status: AC
  Administered 2023-08-04: 500 [IU] via INTRAVENOUS

## 2023-08-04 NOTE — Patient Instructions (Signed)

## 2023-08-06 ENCOUNTER — Other Ambulatory Visit: Payer: Self-pay | Admitting: *Deleted

## 2023-08-06 DIAGNOSIS — R198 Other specified symptoms and signs involving the digestive system and abdomen: Secondary | ICD-10-CM

## 2023-08-07 ENCOUNTER — Inpatient Hospital Stay: Payer: BC Managed Care – PPO

## 2023-08-07 VITALS — BP 126/90 | HR 67 | Temp 98.1°F | Resp 18 | Ht 71.0 in | Wt 203.5 lb

## 2023-08-07 DIAGNOSIS — Z95828 Presence of other vascular implants and grafts: Secondary | ICD-10-CM

## 2023-08-07 DIAGNOSIS — Z9221 Personal history of antineoplastic chemotherapy: Secondary | ICD-10-CM | POA: Diagnosis not present

## 2023-08-07 DIAGNOSIS — R198 Other specified symptoms and signs involving the digestive system and abdomen: Secondary | ICD-10-CM

## 2023-08-07 DIAGNOSIS — C20 Malignant neoplasm of rectum: Secondary | ICD-10-CM | POA: Diagnosis not present

## 2023-08-07 DIAGNOSIS — E860 Dehydration: Secondary | ICD-10-CM | POA: Diagnosis not present

## 2023-08-07 MED ORDER — SODIUM CHLORIDE 0.9 % IV SOLN: INTRAVENOUS | Status: AC

## 2023-08-07 MED ORDER — HEPARIN SOD (PORK) LOCK FLUSH 100 UNIT/ML IV SOLN
500.0000 [IU] | Freq: Once | INTRAVENOUS | Status: AC
  Administered 2023-08-07: 500 [IU] via INTRAVENOUS

## 2023-08-07 MED ORDER — SODIUM CHLORIDE 0.9% FLUSH
10.0000 mL | Freq: Once | INTRAVENOUS | Status: AC
  Administered 2023-08-07: 10 mL via INTRAVENOUS

## 2023-08-07 NOTE — Patient Instructions (Signed)

## 2023-08-11 ENCOUNTER — Inpatient Hospital Stay: Payer: BC Managed Care – PPO

## 2023-08-11 VITALS — BP 119/85 | HR 63 | Temp 98.1°F | Resp 18 | Ht 71.0 in | Wt 201.6 lb

## 2023-08-11 DIAGNOSIS — Z95828 Presence of other vascular implants and grafts: Secondary | ICD-10-CM

## 2023-08-11 DIAGNOSIS — R198 Other specified symptoms and signs involving the digestive system and abdomen: Secondary | ICD-10-CM

## 2023-08-11 DIAGNOSIS — E860 Dehydration: Secondary | ICD-10-CM | POA: Diagnosis not present

## 2023-08-11 DIAGNOSIS — C20 Malignant neoplasm of rectum: Secondary | ICD-10-CM | POA: Diagnosis not present

## 2023-08-11 DIAGNOSIS — Z9221 Personal history of antineoplastic chemotherapy: Secondary | ICD-10-CM | POA: Diagnosis not present

## 2023-08-11 MED ORDER — HEPARIN SOD (PORK) LOCK FLUSH 100 UNIT/ML IV SOLN
500.0000 [IU] | Freq: Once | INTRAVENOUS | Status: AC
  Administered 2023-08-11: 500 [IU]

## 2023-08-11 MED ORDER — SODIUM CHLORIDE 0.9 % IV SOLN: INTRAVENOUS | Status: AC

## 2023-08-11 MED ORDER — SODIUM CHLORIDE 0.9% FLUSH
10.0000 mL | Freq: Once | INTRAVENOUS | Status: AC
  Administered 2023-08-11: 10 mL

## 2023-08-11 NOTE — Patient Instructions (Signed)

## 2023-08-12 DIAGNOSIS — Z932 Ileostomy status: Secondary | ICD-10-CM | POA: Diagnosis not present

## 2023-08-14 ENCOUNTER — Inpatient Hospital Stay: Payer: BC Managed Care – PPO

## 2023-08-14 ENCOUNTER — Other Ambulatory Visit: Payer: Self-pay | Admitting: *Deleted

## 2023-08-14 DIAGNOSIS — R198 Other specified symptoms and signs involving the digestive system and abdomen: Secondary | ICD-10-CM

## 2023-08-14 DIAGNOSIS — C20 Malignant neoplasm of rectum: Secondary | ICD-10-CM | POA: Diagnosis not present

## 2023-08-14 DIAGNOSIS — E860 Dehydration: Secondary | ICD-10-CM | POA: Diagnosis not present

## 2023-08-14 DIAGNOSIS — Z9221 Personal history of antineoplastic chemotherapy: Secondary | ICD-10-CM | POA: Diagnosis not present

## 2023-08-14 MED ORDER — SODIUM CHLORIDE 0.9% FLUSH
10.0000 mL | INTRAVENOUS | Status: AC | PRN
  Administered 2023-08-14: 10 mL

## 2023-08-14 MED ORDER — SODIUM CHLORIDE 0.9 % IV SOLN: INTRAVENOUS | Status: AC

## 2023-08-14 MED ORDER — HEPARIN SOD (PORK) LOCK FLUSH 100 UNIT/ML IV SOLN
500.0000 [IU] | INTRAVENOUS | Status: AC | PRN
  Administered 2023-08-14: 500 [IU]

## 2023-08-14 NOTE — Patient Instructions (Signed)

## 2023-08-18 ENCOUNTER — Inpatient Hospital Stay: Payer: BC Managed Care – PPO | Attending: Oncology

## 2023-08-18 DIAGNOSIS — Z9221 Personal history of antineoplastic chemotherapy: Secondary | ICD-10-CM | POA: Insufficient documentation

## 2023-08-18 DIAGNOSIS — C20 Malignant neoplasm of rectum: Secondary | ICD-10-CM | POA: Insufficient documentation

## 2023-08-18 DIAGNOSIS — E860 Dehydration: Secondary | ICD-10-CM | POA: Diagnosis not present

## 2023-08-18 DIAGNOSIS — Z932 Ileostomy status: Secondary | ICD-10-CM | POA: Diagnosis not present

## 2023-08-18 DIAGNOSIS — R198 Other specified symptoms and signs involving the digestive system and abdomen: Secondary | ICD-10-CM

## 2023-08-18 MED ORDER — SODIUM CHLORIDE 0.9% FLUSH
10.0000 mL | INTRAVENOUS | Status: AC | PRN
  Administered 2023-08-18: 10 mL

## 2023-08-18 MED ORDER — SODIUM CHLORIDE 0.9 % IV SOLN: INTRAVENOUS | Status: AC

## 2023-08-18 MED ORDER — HEPARIN SOD (PORK) LOCK FLUSH 100 UNIT/ML IV SOLN
500.0000 [IU] | INTRAVENOUS | Status: AC | PRN
  Administered 2023-08-18: 500 [IU]

## 2023-08-18 NOTE — Patient Instructions (Signed)

## 2023-08-21 ENCOUNTER — Other Ambulatory Visit (HOSPITAL_BASED_OUTPATIENT_CLINIC_OR_DEPARTMENT_OTHER): Payer: Self-pay

## 2023-08-21 ENCOUNTER — Inpatient Hospital Stay: Payer: BC Managed Care – PPO | Admitting: Oncology

## 2023-08-21 ENCOUNTER — Inpatient Hospital Stay: Payer: BC Managed Care – PPO

## 2023-08-21 ENCOUNTER — Other Ambulatory Visit: Payer: Self-pay | Admitting: *Deleted

## 2023-08-21 ENCOUNTER — Encounter: Payer: Self-pay | Admitting: Oncology

## 2023-08-21 VITALS — BP 127/87 | HR 64

## 2023-08-21 VITALS — BP 126/90 | HR 76 | Temp 98.2°F | Resp 18 | Ht 71.0 in | Wt 200.9 lb

## 2023-08-21 DIAGNOSIS — Z95828 Presence of other vascular implants and grafts: Secondary | ICD-10-CM

## 2023-08-21 DIAGNOSIS — Z932 Ileostomy status: Secondary | ICD-10-CM | POA: Diagnosis not present

## 2023-08-21 DIAGNOSIS — C20 Malignant neoplasm of rectum: Secondary | ICD-10-CM | POA: Diagnosis not present

## 2023-08-21 DIAGNOSIS — R198 Other specified symptoms and signs involving the digestive system and abdomen: Secondary | ICD-10-CM

## 2023-08-21 DIAGNOSIS — Z9221 Personal history of antineoplastic chemotherapy: Secondary | ICD-10-CM | POA: Diagnosis not present

## 2023-08-21 DIAGNOSIS — E860 Dehydration: Secondary | ICD-10-CM | POA: Diagnosis not present

## 2023-08-21 MED ORDER — SODIUM CHLORIDE 0.9 % IV SOLN: INTRAVENOUS | Status: AC

## 2023-08-21 MED ORDER — HEPARIN SOD (PORK) LOCK FLUSH 100 UNIT/ML IV SOLN
500.0000 [IU] | Freq: Once | INTRAVENOUS | Status: AC
  Administered 2023-08-21: 500 [IU]

## 2023-08-21 MED ORDER — SODIUM CHLORIDE 0.9% FLUSH
10.0000 mL | Freq: Once | INTRAVENOUS | Status: AC
  Administered 2023-08-21: 10 mL

## 2023-08-21 NOTE — Patient Instructions (Addendum)

## 2023-08-21 NOTE — Progress Notes (Signed)
[facility] OFFICE PROGRESS NOTE   Diagnosis: Rectal cancer  INTERVAL HISTORY:   Kenneth [name] returns as scheduled.  He continues to receive IV fluids twice weekly.  He empties the ostomy multiple times per day.  He reports small-volume bowel movements per rectum with bleeding.  He has mild rectal discomfort when he has a bowel movement.  He has intermittent cramping abdominal pain and reports early satiety.  Objective:  Vital signs in last 24 hours:  Blood pressure (!) 126/90, pulse 76, temperature 98.2 F (36.8 C), temperature source Temporal, resp. rate 18, height 5\' 11"  (1.803 m), weight 200 lb 14.4 oz (91.1 kg), SpO2 100%.    HEENT: No thrush Resp: Lungs clear bilaterally GI: Right abdomen ileostomy with liquid stool, no hepatomegaly, firm fullness at the left mid abdomen, nontender Vascular: No leg edema  Portacath/PICC-without erythema  Lab Results:  Lab Results  Component Value Date   WBC 6.4 07/17/2023   HGB 15.6 07/17/2023   HCT 46.9 07/17/2023   MCV 85.4 07/17/2023   PLT 181 07/17/2023   NEUTROABS 4.7 07/17/2023    CMP  Lab Results  Component Value Date   NA 138 07/31/2023   K 3.8 07/31/2023   CL 101 07/31/2023   CO2 29 07/31/2023   GLUCOSE 147 (H) 07/31/2023   BUN 19 07/31/2023   CREATININE 1.58 (H) 07/31/2023   CALCIUM 9.0 07/31/2023   PROT 6.8 07/31/2023   ALBUMIN 3.9 07/31/2023   AST 28 07/31/2023   ALT 19 07/31/2023   ALKPHOS 137 (H) 07/31/2023   BILITOT 0.6 07/31/2023   GFRNONAA 49 (L) 07/31/2023   GFRAA 84 03/17/2008    Lab Results  Component Value Date   CEA 5.50 (H) 07/31/2023   Medications: I have reviewed the patient's current medications.   Assessment/Plan:  Rectal cancer-mass at 11 cm on proctoscopy by Dr. Byrd Hesselbach 02/04/2022 Colonoscopy 12/26/2021-obstructing mass at the rectosigmoid measured at 15 cm, biopsy invasive moderate to poorly differentiated adenocarcinoma, mismatch repair protein expression intact,  MSS, tumor mutation burden 4, RAS wild-type CTs 12/31/2021-masslike circumferential thickening of the distal sigmoid colon with mildly enlarged pericolonic lymph nodes, numerous subcentimeter hypodense liver lesions, splenomegaly MRI abdomen 01/07/2022-innumerable T2 hyperintense liver lesions consistent with cysts, spleen unremarkable, no ascites or adenopathy, moderate colonic fecal retention, no bowel obstruction MRI pelvis 01/22/2022-T3cN2 tumor above and below the peritoneal reflection, tumor 11.3 cm from the anal verge and 6.1 cm from the sphincter complex, multiple enlarged perirectal nodes, 1.1 cm node versus tumor deposit at the right aspect of the tumor Diverting loop ileostomy 01/15/2022 Cycle 1 FOLFOX 02/17/2022 Cycle 2 FOLFOX 03/04/2022 Cycle 3 FOLFOX held on 03/17/2022 due to neutropenia Cycle 3 FOLFOX 03/17/2022, Udenyca Treatment held 03/31/2022 due to dehydration Cycle 4 FOLFOX 04/07/2022 Cycle 5 FOLFOX 04/22/2022, oxaliplatin dose reduced due to thrombocytopenia Cycle 6 FOLFOX 05/05/2022 Cycle 7 FOLFOX 05/19/2022 Cycle 8 FOLFOX held secondary to patient preference and toxicity (diarrhea/weight loss/neuropathy symptoms) 06/16/2022 radiation/Xeloda-07/24/2022 MRI 08/27/2022-no suspicious lymph nodes; significant reduction in size with near complete resolution of previously identified tumor deposit versus node along the right aspect of the tumor; overall reduction in bulk and signal of mucinous high rectal malignancy, mrTRG-3 10/06/2022-exploratory laparotomy and laparoscopy-carcinomatosis with low-volume peritoneal disease, dominant area adjacent to sigmoid and right lower quadrant, less than 5 mm implants in the right upper quadrant and left lower quadrant.  PCI 5 CTs 11/14/2022-decrease circumferential wall thickening in the upper rectum and decreased mesorectal lymphadenopathy, no evidence of distant metastatic disease, stable  mild splenomegaly, focal airspace opacity in the left upper lobe-likely  inflammatory CTs 07/02/2023-extensive omental caking and peritoneal tumor, mass in the lesser sac, circumferential wall thickening in the rectum Gout Psoriasis Hypertension Left scalene node versus cyst/lipoma on exam 02/03/2022-CT neck 02/10/2022 with 9.5 mm lymph node in the left supraclavicular region posterior to the sternocleidomastoid muscle.  No other prominent lymph nodes in the neck.  Biopsy 02/12/2022 compatible with benign lymph node, negative for metastatic carcinoma.        Disposition: Kenneth [name] has metastatic rectal cancer.  He has a high output ileostomy.  He continues IV fluids twice weekly.  He confirmed his decision against a trial of systemic therapy.  He understands he can always change his mind on treatment.  He will continue to receive intravenous fluids.  He will be scheduled for an appointment in 1 month.  He will call for increased pain.  Thornton Papas, MD  08/21/2023  10:33 AM

## 2023-08-25 ENCOUNTER — Inpatient Hospital Stay: Payer: BC Managed Care – PPO

## 2023-08-25 DIAGNOSIS — C20 Malignant neoplasm of rectum: Secondary | ICD-10-CM | POA: Diagnosis not present

## 2023-08-25 DIAGNOSIS — E860 Dehydration: Secondary | ICD-10-CM | POA: Diagnosis not present

## 2023-08-25 DIAGNOSIS — R198 Other specified symptoms and signs involving the digestive system and abdomen: Secondary | ICD-10-CM

## 2023-08-25 DIAGNOSIS — Z932 Ileostomy status: Secondary | ICD-10-CM | POA: Diagnosis not present

## 2023-08-25 DIAGNOSIS — Z9221 Personal history of antineoplastic chemotherapy: Secondary | ICD-10-CM | POA: Diagnosis not present

## 2023-08-25 MED ORDER — SODIUM CHLORIDE 0.9% FLUSH
10.0000 mL | INTRAVENOUS | Status: AC | PRN
  Administered 2023-08-25: 10 mL

## 2023-08-25 MED ORDER — SODIUM CHLORIDE 0.9 % IV SOLN: INTRAVENOUS | Status: DC

## 2023-08-25 MED ORDER — HEPARIN SOD (PORK) LOCK FLUSH 100 UNIT/ML IV SOLN
500.0000 [IU] | INTRAVENOUS | Status: AC | PRN
  Administered 2023-08-25: 500 [IU]

## 2023-08-25 NOTE — Patient Instructions (Signed)

## 2023-08-28 ENCOUNTER — Inpatient Hospital Stay: Payer: BC Managed Care – PPO

## 2023-08-28 ENCOUNTER — Other Ambulatory Visit: Payer: Self-pay | Admitting: *Deleted

## 2023-08-28 DIAGNOSIS — Z932 Ileostomy status: Secondary | ICD-10-CM | POA: Diagnosis not present

## 2023-08-28 DIAGNOSIS — R198 Other specified symptoms and signs involving the digestive system and abdomen: Secondary | ICD-10-CM

## 2023-08-28 DIAGNOSIS — Z9221 Personal history of antineoplastic chemotherapy: Secondary | ICD-10-CM | POA: Diagnosis not present

## 2023-08-28 DIAGNOSIS — C20 Malignant neoplasm of rectum: Secondary | ICD-10-CM

## 2023-08-28 DIAGNOSIS — E860 Dehydration: Secondary | ICD-10-CM | POA: Diagnosis not present

## 2023-08-28 LAB — CMP (CANCER CENTER ONLY)
ALT: 16 U/L (ref 0–44)
AST: 26 U/L (ref 15–41)
Albumin: 3.8 g/dL (ref 3.5–5.0)
Alkaline Phosphatase: 138 U/L — ABNORMAL HIGH (ref 38–126)
Anion gap: 6 (ref 5–15)
BUN: 21 mg/dL (ref 8–23)
CO2: 28 mmol/L (ref 22–32)
Calcium: 9.2 mg/dL (ref 8.9–10.3)
Chloride: 102 mmol/L (ref 98–111)
Creatinine: 1.57 mg/dL — ABNORMAL HIGH (ref 0.61–1.24)
GFR, Estimated: 50 mL/min — ABNORMAL LOW (ref >60)
Glucose, Bld: 128 mg/dL — ABNORMAL HIGH (ref 70–99)
Potassium: 3.9 mmol/L (ref 3.5–5.1)
Sodium: 136 mmol/L (ref 135–145)
Total Bilirubin: 0.5 mg/dL (ref 0.3–1.2)
Total Protein: 6.7 g/dL (ref 6.5–8.1)

## 2023-08-28 LAB — CBC WITH DIFFERENTIAL (CANCER CENTER ONLY)
Abs Immature Granulocytes: 0.02 10*3/uL (ref 0.00–0.07)
Basophils Absolute: 0 10*3/uL (ref 0.0–0.1)
Basophils Relative: 1 %
Eosinophils Absolute: 0.2 10*3/uL (ref 0.0–0.5)
Eosinophils Relative: 3 %
HCT: 44.4 % (ref 39.0–52.0)
Hemoglobin: 14.7 g/dL (ref 13.0–17.0)
Immature Granulocytes: 0 %
Lymphocytes Relative: 13 %
Lymphs Abs: 0.8 10*3/uL (ref 0.7–4.0)
MCH: 27.7 pg (ref 26.0–34.0)
MCHC: 33.1 g/dL (ref 30.0–36.0)
MCV: 83.6 fL (ref 80.0–100.0)
Monocytes Absolute: 0.4 10*3/uL (ref 0.1–1.0)
Monocytes Relative: 6 %
Neutro Abs: 4.8 10*3/uL (ref 1.7–7.7)
Neutrophils Relative %: 77 %
Platelet Count: 202 10*3/uL (ref 150–400)
RBC: 5.31 MIL/uL (ref 4.22–5.81)
RDW: 13.4 % (ref 11.5–15.5)
WBC Count: 6.2 10*3/uL (ref 4.0–10.5)
nRBC: 0 % (ref 0.0–0.2)

## 2023-08-28 LAB — MAGNESIUM: Magnesium: 1.8 mg/dL (ref 1.7–2.4)

## 2023-08-28 MED ORDER — SODIUM CHLORIDE 0.9 % IV SOLN: INTRAVENOUS | Status: AC

## 2023-08-28 MED ORDER — HEPARIN SOD (PORK) LOCK FLUSH 100 UNIT/ML IV SOLN
500.0000 [IU] | INTRAVENOUS | Status: AC | PRN
  Administered 2023-08-28: 500 [IU]

## 2023-08-28 MED ORDER — SODIUM CHLORIDE 0.9% FLUSH
10.0000 mL | INTRAVENOUS | Status: AC | PRN
  Administered 2023-08-28: 10 mL

## 2023-08-28 NOTE — Patient Instructions (Signed)

## 2023-09-01 ENCOUNTER — Other Ambulatory Visit: Payer: Self-pay

## 2023-09-01 ENCOUNTER — Inpatient Hospital Stay: Payer: BC Managed Care – PPO

## 2023-09-01 ENCOUNTER — Ambulatory Visit: Payer: BC Managed Care – PPO

## 2023-09-01 ENCOUNTER — Other Ambulatory Visit: Payer: Self-pay | Admitting: *Deleted

## 2023-09-01 VITALS — BP 126/89 | HR 70 | Temp 98.1°F | Resp 18 | Ht 71.0 in | Wt 202.0 lb

## 2023-09-01 DIAGNOSIS — C20 Malignant neoplasm of rectum: Secondary | ICD-10-CM | POA: Diagnosis not present

## 2023-09-01 DIAGNOSIS — R198 Other specified symptoms and signs involving the digestive system and abdomen: Secondary | ICD-10-CM

## 2023-09-01 DIAGNOSIS — Z932 Ileostomy status: Secondary | ICD-10-CM | POA: Diagnosis not present

## 2023-09-01 DIAGNOSIS — E860 Dehydration: Secondary | ICD-10-CM | POA: Diagnosis not present

## 2023-09-01 DIAGNOSIS — Z9221 Personal history of antineoplastic chemotherapy: Secondary | ICD-10-CM | POA: Diagnosis not present

## 2023-09-01 DIAGNOSIS — Z95828 Presence of other vascular implants and grafts: Secondary | ICD-10-CM

## 2023-09-01 MED ORDER — SODIUM CHLORIDE 0.9 % IV SOLN: INTRAVENOUS | Status: AC

## 2023-09-01 MED ORDER — HEPARIN SOD (PORK) LOCK FLUSH 100 UNIT/ML IV SOLN
500.0000 [IU] | Freq: Once | INTRAVENOUS | Status: AC
  Administered 2023-09-01: 500 [IU]

## 2023-09-01 MED ORDER — SODIUM CHLORIDE 0.9% FLUSH
10.0000 mL | Freq: Once | INTRAVENOUS | Status: AC
  Administered 2023-09-01: 10 mL

## 2023-09-01 NOTE — Patient Instructions (Signed)

## 2023-09-04 ENCOUNTER — Ambulatory Visit: Payer: BC Managed Care – PPO

## 2023-09-04 ENCOUNTER — Inpatient Hospital Stay: Payer: BC Managed Care – PPO

## 2023-09-04 DIAGNOSIS — E860 Dehydration: Secondary | ICD-10-CM | POA: Diagnosis not present

## 2023-09-04 DIAGNOSIS — C20 Malignant neoplasm of rectum: Secondary | ICD-10-CM | POA: Diagnosis not present

## 2023-09-04 DIAGNOSIS — R198 Other specified symptoms and signs involving the digestive system and abdomen: Secondary | ICD-10-CM

## 2023-09-04 DIAGNOSIS — Z932 Ileostomy status: Secondary | ICD-10-CM | POA: Diagnosis not present

## 2023-09-04 DIAGNOSIS — Z9221 Personal history of antineoplastic chemotherapy: Secondary | ICD-10-CM | POA: Diagnosis not present

## 2023-09-04 MED ORDER — SODIUM CHLORIDE 0.9 % IV SOLN: INTRAVENOUS | Status: DC

## 2023-09-04 MED ORDER — SODIUM CHLORIDE 0.9% FLUSH
10.0000 mL | INTRAVENOUS | Status: AC | PRN
  Administered 2023-09-04: 10 mL

## 2023-09-04 MED ORDER — HEPARIN SOD (PORK) LOCK FLUSH 100 UNIT/ML IV SOLN
500.0000 [IU] | INTRAVENOUS | Status: AC | PRN
  Administered 2023-09-04: 500 [IU]

## 2023-09-04 NOTE — Patient Instructions (Signed)

## 2023-09-08 ENCOUNTER — Ambulatory Visit: Payer: BC Managed Care – PPO

## 2023-09-08 ENCOUNTER — Other Ambulatory Visit: Payer: Self-pay | Admitting: *Deleted

## 2023-09-08 ENCOUNTER — Inpatient Hospital Stay: Payer: BC Managed Care – PPO

## 2023-09-08 VITALS — BP 122/81 | HR 68 | Temp 98.4°F | Resp 16 | Ht 71.0 in | Wt 202.5 lb

## 2023-09-08 DIAGNOSIS — E860 Dehydration: Secondary | ICD-10-CM | POA: Diagnosis not present

## 2023-09-08 DIAGNOSIS — Z9221 Personal history of antineoplastic chemotherapy: Secondary | ICD-10-CM | POA: Diagnosis not present

## 2023-09-08 DIAGNOSIS — C20 Malignant neoplasm of rectum: Secondary | ICD-10-CM | POA: Diagnosis not present

## 2023-09-08 DIAGNOSIS — R198 Other specified symptoms and signs involving the digestive system and abdomen: Secondary | ICD-10-CM

## 2023-09-08 DIAGNOSIS — Z932 Ileostomy status: Secondary | ICD-10-CM | POA: Diagnosis not present

## 2023-09-08 DIAGNOSIS — Z95828 Presence of other vascular implants and grafts: Secondary | ICD-10-CM

## 2023-09-08 MED ORDER — HEPARIN SOD (PORK) LOCK FLUSH 100 UNIT/ML IV SOLN
500.0000 [IU] | Freq: Once | INTRAVENOUS | Status: AC
  Administered 2023-09-08: 500 [IU]

## 2023-09-08 MED ORDER — SODIUM CHLORIDE 0.9% FLUSH
10.0000 mL | Freq: Once | INTRAVENOUS | Status: AC
  Administered 2023-09-08: 10 mL

## 2023-09-08 MED ORDER — SODIUM CHLORIDE 0.9 % IV SOLN: Freq: Once | INTRAVENOUS | Status: AC

## 2023-09-08 NOTE — Patient Instructions (Signed)

## 2023-09-11 ENCOUNTER — Inpatient Hospital Stay: Payer: BC Managed Care – PPO

## 2023-09-11 ENCOUNTER — Ambulatory Visit: Payer: BC Managed Care – PPO

## 2023-09-11 DIAGNOSIS — Z932 Ileostomy status: Secondary | ICD-10-CM | POA: Diagnosis not present

## 2023-09-11 DIAGNOSIS — C20 Malignant neoplasm of rectum: Secondary | ICD-10-CM | POA: Diagnosis not present

## 2023-09-11 DIAGNOSIS — E860 Dehydration: Secondary | ICD-10-CM | POA: Diagnosis not present

## 2023-09-11 DIAGNOSIS — Z9221 Personal history of antineoplastic chemotherapy: Secondary | ICD-10-CM | POA: Diagnosis not present

## 2023-09-11 DIAGNOSIS — R198 Other specified symptoms and signs involving the digestive system and abdomen: Secondary | ICD-10-CM

## 2023-09-11 MED ORDER — HEPARIN SOD (PORK) LOCK FLUSH 100 UNIT/ML IV SOLN
500.0000 [IU] | INTRAVENOUS | Status: AC | PRN
  Administered 2023-09-11: 500 [IU]

## 2023-09-11 MED ORDER — SODIUM CHLORIDE 0.9 % IV SOLN
INTRAVENOUS | Status: AC
Start: 2023-09-11 — End: 2023-09-11

## 2023-09-11 MED ORDER — SODIUM CHLORIDE 0.9% FLUSH
10.0000 mL | INTRAVENOUS | Status: AC | PRN
  Administered 2023-09-11: 10 mL

## 2023-09-11 NOTE — Patient Instructions (Signed)

## 2023-09-15 ENCOUNTER — Ambulatory Visit: Payer: BC Managed Care – PPO

## 2023-09-15 ENCOUNTER — Other Ambulatory Visit: Payer: Self-pay | Admitting: *Deleted

## 2023-09-15 ENCOUNTER — Inpatient Hospital Stay: Payer: BC Managed Care – PPO

## 2023-09-15 DIAGNOSIS — C20 Malignant neoplasm of rectum: Secondary | ICD-10-CM | POA: Diagnosis not present

## 2023-09-15 DIAGNOSIS — Z932 Ileostomy status: Secondary | ICD-10-CM | POA: Diagnosis not present

## 2023-09-15 DIAGNOSIS — R198 Other specified symptoms and signs involving the digestive system and abdomen: Secondary | ICD-10-CM

## 2023-09-15 DIAGNOSIS — Z9221 Personal history of antineoplastic chemotherapy: Secondary | ICD-10-CM | POA: Diagnosis not present

## 2023-09-15 DIAGNOSIS — E860 Dehydration: Secondary | ICD-10-CM | POA: Diagnosis not present

## 2023-09-15 MED ORDER — SODIUM CHLORIDE 0.9 % IV SOLN: INTRAVENOUS | Status: DC

## 2023-09-15 MED ORDER — SODIUM CHLORIDE 0.9% FLUSH
10.0000 mL | INTRAVENOUS | Status: AC | PRN
  Administered 2023-09-15: 10 mL

## 2023-09-15 MED ORDER — HEPARIN SOD (PORK) LOCK FLUSH 100 UNIT/ML IV SOLN
500.0000 [IU] | INTRAVENOUS | Status: AC | PRN
  Administered 2023-09-15: 500 [IU]

## 2023-09-15 NOTE — Patient Instructions (Signed)

## 2023-09-18 ENCOUNTER — Inpatient Hospital Stay: Payer: BC Managed Care – PPO | Admitting: Nurse Practitioner

## 2023-09-18 ENCOUNTER — Encounter: Payer: Self-pay | Admitting: Nurse Practitioner

## 2023-09-18 ENCOUNTER — Inpatient Hospital Stay: Payer: BC Managed Care – PPO

## 2023-09-18 ENCOUNTER — Inpatient Hospital Stay: Payer: BC Managed Care – PPO | Attending: Nurse Practitioner

## 2023-09-18 VITALS — BP 124/90 | HR 68 | Temp 98.1°F | Resp 18

## 2023-09-18 VITALS — BP 133/87 | HR 78 | Temp 98.1°F | Resp 18 | Ht 71.0 in | Wt 205.1 lb

## 2023-09-18 DIAGNOSIS — C20 Malignant neoplasm of rectum: Secondary | ICD-10-CM

## 2023-09-18 DIAGNOSIS — Z932 Ileostomy status: Secondary | ICD-10-CM | POA: Insufficient documentation

## 2023-09-18 DIAGNOSIS — R198 Other specified symptoms and signs involving the digestive system and abdomen: Secondary | ICD-10-CM

## 2023-09-18 DIAGNOSIS — Z9221 Personal history of antineoplastic chemotherapy: Secondary | ICD-10-CM | POA: Diagnosis not present

## 2023-09-18 DIAGNOSIS — E860 Dehydration: Secondary | ICD-10-CM | POA: Insufficient documentation

## 2023-09-18 DIAGNOSIS — Z95828 Presence of other vascular implants and grafts: Secondary | ICD-10-CM

## 2023-09-18 LAB — CBC WITH DIFFERENTIAL (CANCER CENTER ONLY)
Abs Immature Granulocytes: 0.01 10*3/uL (ref 0.00–0.07)
Basophils Absolute: 0.1 10*3/uL (ref 0.0–0.1)
Basophils Relative: 1 %
Eosinophils Absolute: 0.2 10*3/uL (ref 0.0–0.5)
Eosinophils Relative: 3 %
HCT: 45.3 % (ref 39.0–52.0)
Hemoglobin: 14.8 g/dL (ref 13.0–17.0)
Immature Granulocytes: 0 %
Lymphocytes Relative: 13 %
Lymphs Abs: 0.9 10*3/uL (ref 0.7–4.0)
MCH: 27.7 pg (ref 26.0–34.0)
MCHC: 32.7 g/dL (ref 30.0–36.0)
MCV: 84.8 fL (ref 80.0–100.0)
Monocytes Absolute: 0.5 10*3/uL (ref 0.1–1.0)
Monocytes Relative: 8 %
Neutro Abs: 5.2 10*3/uL (ref 1.7–7.7)
Neutrophils Relative %: 75 %
Platelet Count: 200 10*3/uL (ref 150–400)
RBC: 5.34 MIL/uL (ref 4.22–5.81)
RDW: 14.3 % (ref 11.5–15.5)
WBC Count: 6.9 10*3/uL (ref 4.0–10.5)
nRBC: 0 % (ref 0.0–0.2)

## 2023-09-18 LAB — CMP (CANCER CENTER ONLY)
ALT: 15 U/L (ref 0–44)
AST: 26 U/L (ref 15–41)
Albumin: 4.1 g/dL (ref 3.5–5.0)
Alkaline Phosphatase: 122 U/L (ref 38–126)
Anion gap: 5 (ref 5–15)
BUN: 16 mg/dL (ref 8–23)
CO2: 31 mmol/L (ref 22–32)
Calcium: 9.7 mg/dL (ref 8.9–10.3)
Chloride: 101 mmol/L (ref 98–111)
Creatinine: 1.75 mg/dL — ABNORMAL HIGH (ref 0.61–1.24)
GFR, Estimated: 43 mL/min — ABNORMAL LOW (ref >60)
Glucose, Bld: 106 mg/dL — ABNORMAL HIGH (ref 70–99)
Potassium: 3.7 mmol/L (ref 3.5–5.1)
Sodium: 137 mmol/L (ref 135–145)
Total Bilirubin: 0.6 mg/dL (ref 0.3–1.2)
Total Protein: 6.6 g/dL (ref 6.5–8.1)

## 2023-09-18 LAB — MAGNESIUM: Magnesium: 1.7 mg/dL (ref 1.7–2.4)

## 2023-09-18 MED ORDER — SODIUM CHLORIDE 0.9% FLUSH
10.0000 mL | Freq: Once | INTRAVENOUS | Status: AC
  Administered 2023-09-18: 10 mL

## 2023-09-18 MED ORDER — SODIUM CHLORIDE 0.9 % IV SOLN
INTRAVENOUS | Status: AC
Start: 2023-09-18 — End: 2023-09-18

## 2023-09-18 MED ORDER — HEPARIN SOD (PORK) LOCK FLUSH 100 UNIT/ML IV SOLN
500.0000 [IU] | Freq: Once | INTRAVENOUS | Status: AC
  Administered 2023-09-18: 500 [IU]

## 2023-09-18 NOTE — Patient Instructions (Signed)

## 2023-09-18 NOTE — Patient Instructions (Signed)

## 2023-09-18 NOTE — Progress Notes (Signed)
Kenneth [name] OFFICE PROGRESS NOTE   Diagnosis: Rectal cancer  INTERVAL HISTORY:   Kenneth [name] returns as scheduled.  He notes an increase in blood/mucus from the rectum, estimates 4-5 times per day.  He tends to have crampy lower abdominal pain just prior to this occurring.  He continues to have watery output from the ostomy, maximizing antidiarrheals.  He has a good appetite.  He continues to exercise.  Objective:  Vital signs in last 24 hours:  Blood pressure 133/87, pulse 78, temperature 98.1 F (36.7 C), temperature source Temporal, resp. rate 18, height 5\' 11"  (1.803 m), weight 205 lb 1.6 oz (93 kg), SpO2 99%.    Lymphatics: No palpable cervical, supraclavicular, axillary or inguinal lymph nodes. Resp: Lungs clear bilaterally. Cardio: Regular rate and rhythm. GI: Right abdomen ileostomy, no stool in the collection bag.  No hepatomegaly.  Firm fullness at the left mid to low abdomen.  Nontender. Vascular: No leg edema. Port-A-Cath without erythema.  Lab Results:  Lab Results  Component Value Date   WBC 6.2 08/28/2023   HGB 14.7 08/28/2023   HCT 44.4 08/28/2023   MCV 83.6 08/28/2023   PLT 202 08/28/2023   NEUTROABS 4.8 08/28/2023    Imaging:  No results found.  Medications: I have reviewed the patient's current medications.  Assessment/Plan: Rectal cancer-mass at 11 cm on proctoscopy by Dr. Byrd Hesselbach 02/04/2022 Colonoscopy 12/26/2021-obstructing mass at the rectosigmoid measured at 15 cm, biopsy invasive moderate to poorly differentiated adenocarcinoma, mismatch repair protein expression intact, MSS, tumor mutation burden 4, RAS wild-type CTs 12/31/2021-masslike circumferential thickening of the distal sigmoid colon with mildly enlarged pericolonic lymph nodes, numerous subcentimeter hypodense liver lesions, splenomegaly MRI abdomen 01/07/2022-innumerable T2 hyperintense liver lesions consistent with cysts, spleen unremarkable, no ascites or adenopathy,  moderate colonic fecal retention, no bowel obstruction MRI pelvis 01/22/2022-T3cN2 tumor above and below the peritoneal reflection, tumor 11.3 cm from the anal verge and 6.1 cm from the sphincter complex, multiple enlarged perirectal nodes, 1.1 cm node versus tumor deposit at the right aspect of the tumor Diverting loop ileostomy 01/15/2022 Cycle 1 FOLFOX 02/17/2022 Cycle 2 FOLFOX 03/04/2022 Cycle 3 FOLFOX held on 03/17/2022 due to neutropenia Cycle 3 FOLFOX 03/17/2022, Udenyca Treatment held 03/31/2022 due to dehydration Cycle 4 FOLFOX 04/07/2022 Cycle 5 FOLFOX 04/22/2022, oxaliplatin dose reduced due to thrombocytopenia Cycle 6 FOLFOX 05/05/2022 Cycle 7 FOLFOX 05/19/2022 Cycle 8 FOLFOX held secondary to patient preference and toxicity (diarrhea/weight loss/neuropathy symptoms) 06/16/2022 radiation/Xeloda-07/24/2022 MRI 08/27/2022-no suspicious lymph nodes; significant reduction in size with near complete resolution of previously identified tumor deposit versus node along the right aspect of the tumor; overall reduction in bulk and signal of mucinous high rectal malignancy, mrTRG-3 10/06/2022-exploratory laparotomy and laparoscopy-carcinomatosis with low-volume peritoneal disease, dominant area adjacent to sigmoid and right lower quadrant, less than 5 mm implants in the right upper quadrant and left lower quadrant.  PCI 5 CTs 11/14/2022-decrease circumferential wall thickening in the upper rectum and decreased mesorectal lymphadenopathy, no evidence of distant metastatic disease, stable mild splenomegaly, focal airspace opacity in the left upper lobe-likely inflammatory CTs 07/02/2023-extensive omental caking and peritoneal tumor, mass in the lesser sac, circumferential wall thickening in the rectum Gout Psoriasis Hypertension Left scalene node versus cyst/lipoma on exam 02/03/2022-CT neck 02/10/2022 with 9.5 mm lymph node in the left supraclavicular region posterior to the sternocleidomastoid muscle.  No other  prominent lymph nodes in the neck.  Biopsy 02/12/2022 compatible with benign lymph node, negative for metastatic carcinoma.      Disposition:  Kenneth [name] appears stable.  He is having increased symptoms at the rectum with bleeding and pain.  He understands the high likelihood of the symptoms being related to progression of the rectal tumor.  We discussed a trial of systemic therapy with FOLFIRI/Panitumumab.  We again reviewed potential toxicities.  He was provided with printed information.  He is undecided regarding proceeding with treatment.  He would like to consider what we talked about today and let us know his decision next week.  IV fluids will be continued twice weekly.  We will see him in follow-up in approximately 1 week.  Patient seen with Dr. [name].    Lonna Cobb ANP/GNP-BC   09/18/2023  8:13 AM  This was a shared visit with Lonna Cobb.  Kenneth [name] continues to have a high output ileostomy.  He is having more rectal symptoms.  He will consider beginning treatment with FOLFIRI/panitumumab.  He remains undecided on beginning chemotherapy.  He will return for an office visit and further discussion next week.  He will continue intravenous fluids.  I was present for greater than 50% of today's visit.  I performed medical decision making.  Mancel Bale, MD

## 2023-09-21 ENCOUNTER — Inpatient Hospital Stay: Payer: BC Managed Care – PPO

## 2023-09-21 ENCOUNTER — Other Ambulatory Visit: Payer: Self-pay | Admitting: *Deleted

## 2023-09-21 ENCOUNTER — Encounter: Payer: Self-pay | Admitting: Oncology

## 2023-09-21 VITALS — BP 131/88 | HR 68 | Temp 97.5°F | Resp 18 | Ht 71.0 in | Wt 202.8 lb

## 2023-09-21 DIAGNOSIS — R198 Other specified symptoms and signs involving the digestive system and abdomen: Secondary | ICD-10-CM

## 2023-09-21 DIAGNOSIS — C20 Malignant neoplasm of rectum: Secondary | ICD-10-CM | POA: Diagnosis not present

## 2023-09-21 DIAGNOSIS — E860 Dehydration: Secondary | ICD-10-CM | POA: Diagnosis not present

## 2023-09-21 DIAGNOSIS — Z9221 Personal history of antineoplastic chemotherapy: Secondary | ICD-10-CM | POA: Diagnosis not present

## 2023-09-21 DIAGNOSIS — Z932 Ileostomy status: Secondary | ICD-10-CM | POA: Diagnosis not present

## 2023-09-21 DIAGNOSIS — Z95828 Presence of other vascular implants and grafts: Secondary | ICD-10-CM

## 2023-09-21 MED ORDER — SODIUM CHLORIDE 0.9% FLUSH
10.0000 mL | Freq: Once | INTRAVENOUS | Status: AC
  Administered 2023-09-21: 10 mL via INTRAVENOUS

## 2023-09-21 MED ORDER — HEPARIN SOD (PORK) LOCK FLUSH 100 UNIT/ML IV SOLN
500.0000 [IU] | Freq: Once | INTRAVENOUS | Status: AC
  Administered 2023-09-21: 500 [IU] via INTRAVENOUS

## 2023-09-21 MED ORDER — SODIUM CHLORIDE 0.9 % IV SOLN: Freq: Once | INTRAVENOUS | Status: AC

## 2023-09-21 NOTE — Patient Instructions (Signed)

## 2023-09-25 ENCOUNTER — Encounter: Payer: Self-pay | Admitting: *Deleted

## 2023-09-25 ENCOUNTER — Other Ambulatory Visit: Payer: Self-pay | Admitting: *Deleted

## 2023-09-25 ENCOUNTER — Inpatient Hospital Stay: Payer: BC Managed Care – PPO

## 2023-09-25 VITALS — BP 134/93 | HR 72 | Temp 98.2°F | Resp 18 | Ht 71.0 in | Wt 201.4 lb

## 2023-09-25 DIAGNOSIS — Z9221 Personal history of antineoplastic chemotherapy: Secondary | ICD-10-CM | POA: Diagnosis not present

## 2023-09-25 DIAGNOSIS — C20 Malignant neoplasm of rectum: Secondary | ICD-10-CM | POA: Diagnosis not present

## 2023-09-25 DIAGNOSIS — E860 Dehydration: Secondary | ICD-10-CM | POA: Diagnosis not present

## 2023-09-25 DIAGNOSIS — R198 Other specified symptoms and signs involving the digestive system and abdomen: Secondary | ICD-10-CM

## 2023-09-25 DIAGNOSIS — Z932 Ileostomy status: Secondary | ICD-10-CM | POA: Diagnosis not present

## 2023-09-25 DIAGNOSIS — Z95828 Presence of other vascular implants and grafts: Secondary | ICD-10-CM

## 2023-09-25 LAB — BASIC METABOLIC PANEL - CANCER CENTER ONLY
Anion gap: 8 (ref 5–15)
BUN: 16 mg/dL (ref 8–23)
CO2: 31 mmol/L (ref 22–32)
Calcium: 9.7 mg/dL (ref 8.9–10.3)
Chloride: 98 mmol/L (ref 98–111)
Creatinine: 1.77 mg/dL — ABNORMAL HIGH (ref 0.61–1.24)
GFR, Estimated: 43 mL/min — ABNORMAL LOW (ref >60)
Glucose, Bld: 119 mg/dL — ABNORMAL HIGH (ref 70–99)
Potassium: 3.9 mmol/L (ref 3.5–5.1)
Sodium: 137 mmol/L (ref 135–145)

## 2023-09-25 LAB — CEA (ACCESS): CEA (CHCC): 7.95 ng/mL — ABNORMAL HIGH (ref 0.00–5.00)

## 2023-09-25 LAB — MAGNESIUM: Magnesium: 1.9 mg/dL (ref 1.7–2.4)

## 2023-09-25 MED ORDER — SODIUM CHLORIDE 0.9% FLUSH
10.0000 mL | Freq: Once | INTRAVENOUS | Status: AC
Start: 2023-09-25 — End: 2023-09-25
  Administered 2023-09-25: 10 mL via INTRAVENOUS

## 2023-09-25 MED ORDER — SODIUM CHLORIDE 0.9 % IV SOLN
INTRAVENOUS | Status: AC
Start: 2023-09-25 — End: 2023-09-25

## 2023-09-25 MED ORDER — HEPARIN SOD (PORK) LOCK FLUSH 100 UNIT/ML IV SOLN
500.0000 [IU] | Freq: Once | INTRAVENOUS | Status: AC
Start: 2023-09-25 — End: 2023-09-25
  Administered 2023-09-25: 500 [IU] via INTRAVENOUS

## 2023-09-25 NOTE — Patient Instructions (Signed)

## 2023-09-25 NOTE — Progress Notes (Signed)
Mr. Krout requesting IVF on 12/2 and 12/6. Scheduling message sent w/addition of lab/OV to 2/6. Will see NP after IVF begin.

## 2023-09-28 ENCOUNTER — Other Ambulatory Visit (HOSPITAL_BASED_OUTPATIENT_CLINIC_OR_DEPARTMENT_OTHER): Payer: Self-pay

## 2023-09-28 ENCOUNTER — Other Ambulatory Visit: Payer: Self-pay

## 2023-09-29 ENCOUNTER — Inpatient Hospital Stay: Payer: BC Managed Care – PPO

## 2023-09-29 DIAGNOSIS — Z9221 Personal history of antineoplastic chemotherapy: Secondary | ICD-10-CM | POA: Diagnosis not present

## 2023-09-29 DIAGNOSIS — R198 Other specified symptoms and signs involving the digestive system and abdomen: Secondary | ICD-10-CM

## 2023-09-29 DIAGNOSIS — C20 Malignant neoplasm of rectum: Secondary | ICD-10-CM | POA: Diagnosis not present

## 2023-09-29 DIAGNOSIS — Z932 Ileostomy status: Secondary | ICD-10-CM | POA: Diagnosis not present

## 2023-09-29 DIAGNOSIS — E860 Dehydration: Secondary | ICD-10-CM | POA: Diagnosis not present

## 2023-09-29 MED ORDER — SODIUM CHLORIDE 0.9 % IV SOLN
INTRAVENOUS | Status: AC
Start: 2023-09-29 — End: 2023-09-29

## 2023-09-29 MED ORDER — HEPARIN SOD (PORK) LOCK FLUSH 100 UNIT/ML IV SOLN
500.0000 [IU] | INTRAVENOUS | Status: AC | PRN
  Administered 2023-09-29: 500 [IU]

## 2023-09-29 MED ORDER — SODIUM CHLORIDE 0.9% FLUSH
10.0000 mL | INTRAVENOUS | Status: AC | PRN
  Administered 2023-09-29: 10 mL

## 2023-09-29 NOTE — Patient Instructions (Signed)

## 2023-09-30 ENCOUNTER — Other Ambulatory Visit: Payer: Self-pay | Admitting: *Deleted

## 2023-09-30 DIAGNOSIS — R198 Other specified symptoms and signs involving the digestive system and abdomen: Secondary | ICD-10-CM

## 2023-10-02 ENCOUNTER — Other Ambulatory Visit: Payer: BC Managed Care – PPO

## 2023-10-02 ENCOUNTER — Inpatient Hospital Stay: Payer: BC Managed Care – PPO

## 2023-10-02 DIAGNOSIS — C20 Malignant neoplasm of rectum: Secondary | ICD-10-CM | POA: Diagnosis not present

## 2023-10-02 DIAGNOSIS — Z9221 Personal history of antineoplastic chemotherapy: Secondary | ICD-10-CM | POA: Diagnosis not present

## 2023-10-02 DIAGNOSIS — Z932 Ileostomy status: Secondary | ICD-10-CM | POA: Diagnosis not present

## 2023-10-02 DIAGNOSIS — E860 Dehydration: Secondary | ICD-10-CM | POA: Diagnosis not present

## 2023-10-02 DIAGNOSIS — R198 Other specified symptoms and signs involving the digestive system and abdomen: Secondary | ICD-10-CM

## 2023-10-02 MED ORDER — HEPARIN SOD (PORK) LOCK FLUSH 100 UNIT/ML IV SOLN
500.0000 [IU] | INTRAVENOUS | Status: AC | PRN
  Administered 2023-10-02: 500 [IU]

## 2023-10-02 MED ORDER — SODIUM CHLORIDE 0.9% FLUSH
10.0000 mL | INTRAVENOUS | Status: AC | PRN
  Administered 2023-10-02: 10 mL

## 2023-10-02 MED ORDER — SODIUM CHLORIDE 0.9 % IV SOLN
INTRAVENOUS | Status: AC
Start: 2023-10-02 — End: 2023-10-02

## 2023-10-02 NOTE — Patient Instructions (Signed)

## 2023-10-06 ENCOUNTER — Inpatient Hospital Stay: Payer: BC Managed Care – PPO

## 2023-10-06 DIAGNOSIS — Z9221 Personal history of antineoplastic chemotherapy: Secondary | ICD-10-CM | POA: Diagnosis not present

## 2023-10-06 DIAGNOSIS — Z932 Ileostomy status: Secondary | ICD-10-CM | POA: Diagnosis not present

## 2023-10-06 DIAGNOSIS — E860 Dehydration: Secondary | ICD-10-CM | POA: Diagnosis not present

## 2023-10-06 DIAGNOSIS — C20 Malignant neoplasm of rectum: Secondary | ICD-10-CM | POA: Diagnosis not present

## 2023-10-06 DIAGNOSIS — R198 Other specified symptoms and signs involving the digestive system and abdomen: Secondary | ICD-10-CM

## 2023-10-06 MED ORDER — SODIUM CHLORIDE 0.9% FLUSH
10.0000 mL | INTRAVENOUS | Status: AC | PRN
  Administered 2023-10-06: 10 mL

## 2023-10-06 MED ORDER — HEPARIN SOD (PORK) LOCK FLUSH 100 UNIT/ML IV SOLN
500.0000 [IU] | INTRAVENOUS | Status: AC | PRN
  Administered 2023-10-06: 500 [IU]

## 2023-10-06 MED ORDER — SODIUM CHLORIDE 0.9 % IV SOLN
INTRAVENOUS | Status: AC
Start: 2023-10-06 — End: 2023-10-06

## 2023-10-06 NOTE — Patient Instructions (Signed)

## 2023-10-07 ENCOUNTER — Encounter: Payer: Self-pay | Admitting: Oncology

## 2023-10-07 NOTE — Telephone Encounter (Signed)
Telephone call  

## 2023-10-08 ENCOUNTER — Other Ambulatory Visit: Payer: Self-pay | Admitting: *Deleted

## 2023-10-08 DIAGNOSIS — R198 Other specified symptoms and signs involving the digestive system and abdomen: Secondary | ICD-10-CM

## 2023-10-09 ENCOUNTER — Other Ambulatory Visit: Payer: BC Managed Care – PPO

## 2023-10-09 ENCOUNTER — Inpatient Hospital Stay: Payer: BC Managed Care – PPO

## 2023-10-09 ENCOUNTER — Other Ambulatory Visit (HOSPITAL_BASED_OUTPATIENT_CLINIC_OR_DEPARTMENT_OTHER): Payer: Self-pay

## 2023-10-09 ENCOUNTER — Telehealth: Payer: Self-pay | Admitting: *Deleted

## 2023-10-09 DIAGNOSIS — C20 Malignant neoplasm of rectum: Secondary | ICD-10-CM | POA: Diagnosis not present

## 2023-10-09 DIAGNOSIS — Z932 Ileostomy status: Secondary | ICD-10-CM | POA: Diagnosis not present

## 2023-10-09 DIAGNOSIS — Z9221 Personal history of antineoplastic chemotherapy: Secondary | ICD-10-CM | POA: Diagnosis not present

## 2023-10-09 DIAGNOSIS — E860 Dehydration: Secondary | ICD-10-CM | POA: Diagnosis not present

## 2023-10-09 DIAGNOSIS — R198 Other specified symptoms and signs involving the digestive system and abdomen: Secondary | ICD-10-CM

## 2023-10-09 MED ORDER — POTASSIUM CHLORIDE CRYS ER 20 MEQ PO TBCR
20.0000 meq | EXTENDED_RELEASE_TABLET | Freq: Every day | ORAL | 2 refills | Status: DC
  Filled 2023-10-09: qty 30, 30d supply, fill #0

## 2023-10-09 MED ORDER — SODIUM CHLORIDE 0.9 % IV SOLN
INTRAVENOUS | Status: AC
Start: 2023-10-09 — End: 2023-10-09

## 2023-10-09 MED ORDER — DICYCLOMINE HCL 10 MG PO CAPS
10.0000 mg | ORAL_CAPSULE | Freq: Three times a day (TID) | ORAL | 1 refills | Status: DC | PRN
  Filled 2023-10-09: qty 60, 20d supply, fill #0

## 2023-10-09 MED ORDER — SODIUM CHLORIDE 0.9% FLUSH
10.0000 mL | INTRAVENOUS | Status: AC | PRN
  Administered 2023-10-09: 10 mL

## 2023-10-09 MED ORDER — HEPARIN SOD (PORK) LOCK FLUSH 100 UNIT/ML IV SOLN
500.0000 [IU] | INTRAVENOUS | Status: AC | PRN
  Administered 2023-10-09: 500 [IU]

## 2023-10-09 MED ORDER — TRAMADOL HCL 50 MG PO TABS
50.0000 mg | ORAL_TABLET | Freq: Four times a day (QID) | ORAL | 0 refills | Status: DC | PRN
  Filled 2023-10-09: qty 30, 7d supply, fill #0

## 2023-10-09 NOTE — Patient Instructions (Signed)

## 2023-10-09 NOTE — Telephone Encounter (Signed)
Mrs. Neuburger called to report the following concerns: Having more pain (same area as he has been having pain). Marshal says pain is in the entire rectal/pelvic area and can range 1-6/10-only takes Tylenol PM at bedtime so he can get some sleep Increase in fatigue-Has not been able to exercise 3/week any longer. Plans to stop. Wears out by end of day Diminished appetite, approximately 50% of what he used to consume-Reports he can go 8 hours without eating. More digestive issues as well. Having more gas/cramping and more rectal/ostomy discharge w/some blood noted. Per Dr. [name]: Treatment would help a lot of these symptoms. Can try Tramodol for pain and add Bentyl for cramping. Try small frequent snacks.

## 2023-10-12 ENCOUNTER — Inpatient Hospital Stay: Payer: BC Managed Care – PPO

## 2023-10-12 VITALS — BP 135/87 | HR 68 | Temp 98.1°F | Resp 18 | Ht 71.0 in | Wt 203.8 lb

## 2023-10-12 DIAGNOSIS — E860 Dehydration: Secondary | ICD-10-CM | POA: Diagnosis not present

## 2023-10-12 DIAGNOSIS — Z95828 Presence of other vascular implants and grafts: Secondary | ICD-10-CM

## 2023-10-12 DIAGNOSIS — C20 Malignant neoplasm of rectum: Secondary | ICD-10-CM | POA: Diagnosis not present

## 2023-10-12 DIAGNOSIS — R198 Other specified symptoms and signs involving the digestive system and abdomen: Secondary | ICD-10-CM

## 2023-10-12 DIAGNOSIS — Z9221 Personal history of antineoplastic chemotherapy: Secondary | ICD-10-CM | POA: Diagnosis not present

## 2023-10-12 DIAGNOSIS — Z932 Ileostomy status: Secondary | ICD-10-CM | POA: Diagnosis not present

## 2023-10-12 MED ORDER — SODIUM CHLORIDE 0.9% FLUSH
10.0000 mL | Freq: Once | INTRAVENOUS | Status: AC
  Administered 2023-10-12: 10 mL via INTRAVENOUS

## 2023-10-12 MED ORDER — SODIUM CHLORIDE 0.9 % IV SOLN
INTRAVENOUS | Status: AC
Start: 2023-10-12 — End: 2023-10-12

## 2023-10-12 MED ORDER — HEPARIN SOD (PORK) LOCK FLUSH 100 UNIT/ML IV SOLN
500.0000 [IU] | Freq: Once | INTRAVENOUS | Status: AC
  Administered 2023-10-12: 500 [IU] via INTRAVENOUS

## 2023-10-12 NOTE — Patient Instructions (Signed)

## 2023-10-13 ENCOUNTER — Other Ambulatory Visit: Payer: Self-pay

## 2023-10-13 DIAGNOSIS — R198 Other specified symptoms and signs involving the digestive system and abdomen: Secondary | ICD-10-CM

## 2023-10-14 ENCOUNTER — Inpatient Hospital Stay: Payer: BC Managed Care – PPO

## 2023-10-14 ENCOUNTER — Other Ambulatory Visit: Payer: BC Managed Care – PPO

## 2023-10-14 DIAGNOSIS — R198 Other specified symptoms and signs involving the digestive system and abdomen: Secondary | ICD-10-CM

## 2023-10-14 DIAGNOSIS — E860 Dehydration: Secondary | ICD-10-CM | POA: Diagnosis not present

## 2023-10-14 DIAGNOSIS — Z9221 Personal history of antineoplastic chemotherapy: Secondary | ICD-10-CM | POA: Diagnosis not present

## 2023-10-14 DIAGNOSIS — C20 Malignant neoplasm of rectum: Secondary | ICD-10-CM | POA: Diagnosis not present

## 2023-10-14 DIAGNOSIS — Z932 Ileostomy status: Secondary | ICD-10-CM | POA: Diagnosis not present

## 2023-10-14 MED ORDER — SODIUM CHLORIDE 0.9% FLUSH
10.0000 mL | INTRAVENOUS | Status: AC | PRN
  Administered 2023-10-14: 10 mL

## 2023-10-14 MED ORDER — SODIUM CHLORIDE 0.9 % IV SOLN
INTRAVENOUS | Status: AC
Start: 2023-10-14 — End: 2023-10-14

## 2023-10-14 MED ORDER — HEPARIN SOD (PORK) LOCK FLUSH 100 UNIT/ML IV SOLN
500.0000 [IU] | INTRAVENOUS | Status: AC | PRN
  Administered 2023-10-14: 500 [IU]

## 2023-10-14 NOTE — Patient Instructions (Signed)

## 2023-10-19 ENCOUNTER — Inpatient Hospital Stay: Payer: BC Managed Care – PPO | Attending: Oncology

## 2023-10-19 DIAGNOSIS — Z9221 Personal history of antineoplastic chemotherapy: Secondary | ICD-10-CM | POA: Diagnosis not present

## 2023-10-19 DIAGNOSIS — Z452 Encounter for adjustment and management of vascular access device: Secondary | ICD-10-CM | POA: Insufficient documentation

## 2023-10-19 DIAGNOSIS — R630 Anorexia: Secondary | ICD-10-CM | POA: Insufficient documentation

## 2023-10-19 DIAGNOSIS — R5383 Other fatigue: Secondary | ICD-10-CM | POA: Insufficient documentation

## 2023-10-19 DIAGNOSIS — K625 Hemorrhage of anus and rectum: Secondary | ICD-10-CM | POA: Insufficient documentation

## 2023-10-19 DIAGNOSIS — Z932 Ileostomy status: Secondary | ICD-10-CM | POA: Diagnosis not present

## 2023-10-19 DIAGNOSIS — E860 Dehydration: Secondary | ICD-10-CM | POA: Insufficient documentation

## 2023-10-19 DIAGNOSIS — C20 Malignant neoplasm of rectum: Secondary | ICD-10-CM | POA: Insufficient documentation

## 2023-10-19 DIAGNOSIS — R198 Other specified symptoms and signs involving the digestive system and abdomen: Secondary | ICD-10-CM

## 2023-10-19 MED ORDER — SODIUM CHLORIDE 0.9 % IV SOLN: INTRAVENOUS | Status: AC

## 2023-10-19 MED ORDER — HEPARIN SOD (PORK) LOCK FLUSH 100 UNIT/ML IV SOLN
500.0000 [IU] | Freq: Once | INTRAVENOUS | Status: AC
Start: 2023-10-19 — End: 2023-10-19
  Administered 2023-10-19: 500 [IU] via INTRAVENOUS

## 2023-10-19 MED ORDER — SODIUM CHLORIDE 0.9% FLUSH
10.0000 mL | Freq: Once | INTRAVENOUS | Status: AC
Start: 2023-10-19 — End: 2023-10-19
  Administered 2023-10-19: 10 mL via INTRAVENOUS

## 2023-10-19 NOTE — Patient Instructions (Signed)

## 2023-10-20 ENCOUNTER — Other Ambulatory Visit: Payer: Self-pay | Admitting: *Deleted

## 2023-10-20 DIAGNOSIS — R198 Other specified symptoms and signs involving the digestive system and abdomen: Secondary | ICD-10-CM

## 2023-10-23 ENCOUNTER — Inpatient Hospital Stay: Payer: BC Managed Care – PPO

## 2023-10-23 ENCOUNTER — Other Ambulatory Visit: Payer: Self-pay | Admitting: *Deleted

## 2023-10-23 ENCOUNTER — Encounter: Payer: Self-pay | Admitting: Nurse Practitioner

## 2023-10-23 ENCOUNTER — Other Ambulatory Visit (HOSPITAL_BASED_OUTPATIENT_CLINIC_OR_DEPARTMENT_OTHER): Payer: Self-pay

## 2023-10-23 ENCOUNTER — Inpatient Hospital Stay: Payer: BC Managed Care – PPO | Admitting: Nurse Practitioner

## 2023-10-23 ENCOUNTER — Other Ambulatory Visit: Payer: Self-pay

## 2023-10-23 VITALS — BP 141/90 | HR 67 | Temp 98.1°F | Resp 18 | Ht 71.0 in | Wt 199.2 lb

## 2023-10-23 DIAGNOSIS — R198 Other specified symptoms and signs involving the digestive system and abdomen: Secondary | ICD-10-CM

## 2023-10-23 DIAGNOSIS — C20 Malignant neoplasm of rectum: Secondary | ICD-10-CM

## 2023-10-23 DIAGNOSIS — Z932 Ileostomy status: Secondary | ICD-10-CM | POA: Diagnosis not present

## 2023-10-23 DIAGNOSIS — R5383 Other fatigue: Secondary | ICD-10-CM | POA: Diagnosis not present

## 2023-10-23 DIAGNOSIS — Z9221 Personal history of antineoplastic chemotherapy: Secondary | ICD-10-CM | POA: Diagnosis not present

## 2023-10-23 DIAGNOSIS — E860 Dehydration: Secondary | ICD-10-CM | POA: Diagnosis not present

## 2023-10-23 DIAGNOSIS — K625 Hemorrhage of anus and rectum: Secondary | ICD-10-CM | POA: Diagnosis not present

## 2023-10-23 DIAGNOSIS — Z452 Encounter for adjustment and management of vascular access device: Secondary | ICD-10-CM | POA: Diagnosis not present

## 2023-10-23 DIAGNOSIS — R630 Anorexia: Secondary | ICD-10-CM | POA: Diagnosis not present

## 2023-10-23 LAB — CBC WITH DIFFERENTIAL (CANCER CENTER ONLY)
Abs Immature Granulocytes: 0.02 10*3/uL (ref 0.00–0.07)
Basophils Absolute: 0 10*3/uL (ref 0.0–0.1)
Basophils Relative: 1 %
Eosinophils Absolute: 0.2 10*3/uL (ref 0.0–0.5)
Eosinophils Relative: 3 %
HCT: 45.8 % (ref 39.0–52.0)
Hemoglobin: 14.9 g/dL (ref 13.0–17.0)
Immature Granulocytes: 0 %
Lymphocytes Relative: 11 %
Lymphs Abs: 0.8 10*3/uL (ref 0.7–4.0)
MCH: 27.5 pg (ref 26.0–34.0)
MCHC: 32.5 g/dL (ref 30.0–36.0)
MCV: 84.5 fL (ref 80.0–100.0)
Monocytes Absolute: 0.5 10*3/uL (ref 0.1–1.0)
Monocytes Relative: 6 %
Neutro Abs: 5.8 10*3/uL (ref 1.7–7.7)
Neutrophils Relative %: 79 %
Platelet Count: 219 10*3/uL (ref 150–400)
RBC: 5.42 MIL/uL (ref 4.22–5.81)
RDW: 13.9 % (ref 11.5–15.5)
WBC Count: 7.3 10*3/uL (ref 4.0–10.5)
nRBC: 0 % (ref 0.0–0.2)

## 2023-10-23 LAB — CMP (CANCER CENTER ONLY)
ALT: 14 U/L (ref 0–44)
AST: 23 U/L (ref 15–41)
Albumin: 4.1 g/dL (ref 3.5–5.0)
Alkaline Phosphatase: 144 U/L — ABNORMAL HIGH (ref 38–126)
Anion gap: 8 (ref 5–15)
BUN: 17 mg/dL (ref 8–23)
CO2: 30 mmol/L (ref 22–32)
Calcium: 9.3 mg/dL (ref 8.9–10.3)
Chloride: 100 mmol/L (ref 98–111)
Creatinine: 1.67 mg/dL — ABNORMAL HIGH (ref 0.61–1.24)
GFR, Estimated: 46 mL/min — ABNORMAL LOW (ref >60)
Glucose, Bld: 131 mg/dL — ABNORMAL HIGH (ref 70–99)
Potassium: 4 mmol/L (ref 3.5–5.1)
Sodium: 138 mmol/L (ref 135–145)
Total Bilirubin: 0.6 mg/dL (ref <1.2)
Total Protein: 7.3 g/dL (ref 6.5–8.1)

## 2023-10-23 LAB — MAGNESIUM: Magnesium: 1.7 mg/dL (ref 1.7–2.4)

## 2023-10-23 MED ORDER — SODIUM CHLORIDE 0.9 % IV SOLN
INTRAVENOUS | Status: AC
Start: 2023-10-23 — End: 2023-10-23

## 2023-10-23 MED ORDER — SODIUM CHLORIDE 0.9% FLUSH
10.0000 mL | Freq: Once | INTRAVENOUS | Status: AC
Start: 2023-10-23 — End: 2023-10-23
  Administered 2023-10-23: 10 mL via INTRAVENOUS

## 2023-10-23 MED ORDER — HYDROCODONE-ACETAMINOPHEN 5-325 MG PO TABS
1.0000 | ORAL_TABLET | Freq: Four times a day (QID) | ORAL | 0 refills | Status: DC | PRN
  Filled 2023-10-23: qty 30, 4d supply, fill #0

## 2023-10-23 MED ORDER — HEPARIN SOD (PORK) LOCK FLUSH 100 UNIT/ML IV SOLN
500.0000 [IU] | Freq: Once | INTRAVENOUS | Status: AC
Start: 2023-10-23 — End: 2023-10-23
  Administered 2023-10-23: 500 [IU] via INTRAVENOUS

## 2023-10-23 NOTE — Patient Instructions (Signed)

## 2023-10-23 NOTE — Progress Notes (Signed)
**Kenneth Kenneth** Kenneth Kenneth   Diagnosis: Rectal cancer  INTERVAL HISTORY:   Kenneth Kenneth returns as scheduled.  He has had an overall decline in the past 3 weeks.  He is more fatigued.  He is no longer exercising.  He is eating less, notes early satiety.  He has more pain at the rectum.  He continues to have a bloody mucousy discharge several times a day.  He takes tramadol for the pain with overall good relief.  Objective:  Vital signs in last 24 hours:  Blood pressure (!) 141/90, pulse 67, temperature 98.1 F (36.7 C), temperature source Temporal, resp. rate 18, height 5\' 11"  (1.803 m), weight 199 lb 3.2 oz (90.4 kg), SpO2 99%.    Resp: Lungs clear bilaterally. Cardio: Regular rate and rhythm. GI: Right abdomen ileostomy.  Abdomen appears distended.  Firm fullness left mid to low abdomen. Vascular: No leg edema. Port-A-Cath without erythema.  Lab Results:  Lab Results  Component Value Date   WBC 7.3 10/23/2023   HGB 14.9 10/23/2023   HCT 45.8 10/23/2023   MCV 84.5 10/23/2023   PLT 219 10/23/2023   NEUTROABS 5.8 10/23/2023    Imaging:  No results found.  Medications: I have reviewed the patient's current medications.  Assessment/Plan: Rectal cancer-mass at 11 cm on proctoscopy by Dr. Byrd Hesselbach 02/04/2022 Colonoscopy 12/26/2021-obstructing mass at the rectosigmoid measured at 15 cm, biopsy invasive moderate to poorly differentiated adenocarcinoma, mismatch repair protein expression intact, MSS, tumor mutation burden 4, RAS wild-type CTs 12/31/2021-masslike circumferential thickening of the distal sigmoid colon with mildly enlarged pericolonic lymph nodes, numerous subcentimeter hypodense liver lesions, splenomegaly MRI abdomen 01/07/2022-innumerable T2 hyperintense liver lesions consistent with cysts, spleen unremarkable, no ascites or adenopathy, moderate colonic fecal retention, no bowel obstruction MRI pelvis 01/22/2022-T3cN2 tumor above and below the  peritoneal reflection, tumor 11.3 cm from the anal verge and 6.1 cm from the sphincter complex, multiple enlarged perirectal nodes, 1.1 cm node versus tumor deposit at the right aspect of the tumor Diverting loop ileostomy 01/15/2022 Cycle 1 FOLFOX 02/17/2022 Cycle 2 FOLFOX 03/04/2022 Cycle 3 FOLFOX held on 03/17/2022 due to neutropenia Cycle 3 FOLFOX 03/17/2022, Udenyca Treatment held 03/31/2022 due to dehydration Cycle 4 FOLFOX 04/07/2022 Cycle 5 FOLFOX 04/22/2022, oxaliplatin dose reduced due to thrombocytopenia Cycle 6 FOLFOX 05/05/2022 Cycle 7 FOLFOX 05/19/2022 Cycle 8 FOLFOX held secondary to patient preference and toxicity (diarrhea/weight loss/neuropathy symptoms) 06/16/2022 radiation/Xeloda-07/24/2022 MRI 08/27/2022-no suspicious lymph nodes; significant reduction in size with near complete resolution of previously identified tumor deposit versus node along the right aspect of the tumor; overall reduction in bulk and signal of mucinous high rectal malignancy, mrTRG-3 10/06/2022-exploratory laparotomy and laparoscopy-carcinomatosis with low-volume peritoneal disease, dominant area adjacent to sigmoid and right lower quadrant, less than 5 mm implants in the right upper quadrant and left lower quadrant.  PCI 5 CTs 11/14/2022-decrease circumferential wall thickening in the upper rectum and decreased mesorectal lymphadenopathy, no evidence of distant metastatic disease, stable mild splenomegaly, focal airspace opacity in the left upper lobe-likely inflammatory CTs 07/02/2023-extensive omental caking and peritoneal tumor, mass in the lesser sac, circumferential wall thickening in the rectum Gout Psoriasis Hypertension Left scalene node versus cyst/lipoma on exam 02/03/2022-CT neck 02/10/2022 with 9.5 mm lymph node in the left supraclavicular region posterior to the sternocleidomastoid muscle.  No other prominent lymph nodes in the neck.  Biopsy 02/12/2022 compatible with benign lymph node, negative for metastatic  carcinoma.      Disposition: Kenneth Kenneth has metastatic rectal cancer.  He is currently being followed with observation.  He is experiencing increased pain and bleeding at the rectum.  Also anorexia and fatigue.  We again reviewed a trial of systemic therapy with FOLFIRI/Panitumumab.  We discussed the various potential toxicities.  He was provided with printed information.  We also discussed supportive/comfort care.  He would like to consider his options over the next few days and contact us early next week with the decision.  For now the plan is to continue IV fluids twice weekly.  He will return for follow-up in approximately 4 weeks but we are available to see him sooner if he has any problems or decides to pursue systemic therapy.    Lonna Cobb ANP/GNP-BC   10/23/2023  2:19 PM

## 2023-10-26 ENCOUNTER — Other Ambulatory Visit: Payer: Self-pay | Admitting: Nurse Practitioner

## 2023-10-26 ENCOUNTER — Encounter: Payer: Self-pay | Admitting: Nurse Practitioner

## 2023-10-26 ENCOUNTER — Other Ambulatory Visit: Payer: Self-pay

## 2023-10-26 ENCOUNTER — Encounter: Payer: Self-pay | Admitting: Oncology

## 2023-10-26 ENCOUNTER — Other Ambulatory Visit (HOSPITAL_BASED_OUTPATIENT_CLINIC_OR_DEPARTMENT_OTHER): Payer: Self-pay

## 2023-10-26 DIAGNOSIS — C20 Malignant neoplasm of rectum: Secondary | ICD-10-CM

## 2023-10-26 MED ORDER — TRAMADOL HCL 50 MG PO TABS
50.0000 mg | ORAL_TABLET | Freq: Four times a day (QID) | ORAL | 0 refills | Status: DC | PRN
  Filled 2023-10-26: qty 60, 15d supply, fill #0

## 2023-10-26 MED ORDER — PANTOPRAZOLE SODIUM 20 MG PO TBEC
20.0000 mg | DELAYED_RELEASE_TABLET | Freq: Every day | ORAL | 2 refills | Status: DC
  Filled 2023-10-26: qty 30, 30d supply, fill #0

## 2023-10-27 ENCOUNTER — Inpatient Hospital Stay: Payer: BC Managed Care – PPO

## 2023-10-27 ENCOUNTER — Telehealth: Payer: Self-pay | Admitting: *Deleted

## 2023-10-27 VITALS — BP 130/90 | HR 85 | Temp 98.7°F | Resp 18 | Ht 71.0 in | Wt 198.6 lb

## 2023-10-27 DIAGNOSIS — G8929 Other chronic pain: Secondary | ICD-10-CM | POA: Diagnosis not present

## 2023-10-27 DIAGNOSIS — G620 Drug-induced polyneuropathy: Secondary | ICD-10-CM | POA: Diagnosis not present

## 2023-10-27 DIAGNOSIS — Z932 Ileostomy status: Secondary | ICD-10-CM | POA: Diagnosis not present

## 2023-10-27 DIAGNOSIS — C20 Malignant neoplasm of rectum: Secondary | ICD-10-CM | POA: Diagnosis not present

## 2023-10-27 DIAGNOSIS — R6339 Other feeding difficulties: Secondary | ICD-10-CM | POA: Diagnosis not present

## 2023-10-27 DIAGNOSIS — R180 Malignant ascites: Secondary | ICD-10-CM | POA: Diagnosis not present

## 2023-10-27 DIAGNOSIS — R7989 Other specified abnormal findings of blood chemistry: Secondary | ICD-10-CM | POA: Diagnosis present

## 2023-10-27 DIAGNOSIS — Z923 Personal history of irradiation: Secondary | ICD-10-CM | POA: Diagnosis not present

## 2023-10-27 DIAGNOSIS — K6289 Other specified diseases of anus and rectum: Secondary | ICD-10-CM | POA: Diagnosis present

## 2023-10-27 DIAGNOSIS — C800 Disseminated malignant neoplasm, unspecified: Secondary | ICD-10-CM | POA: Diagnosis not present

## 2023-10-27 DIAGNOSIS — Z7189 Other specified counseling: Secondary | ICD-10-CM | POA: Diagnosis not present

## 2023-10-27 DIAGNOSIS — K56609 Unspecified intestinal obstruction, unspecified as to partial versus complete obstruction: Secondary | ICD-10-CM | POA: Diagnosis not present

## 2023-10-27 DIAGNOSIS — R9431 Abnormal electrocardiogram [ECG] [EKG]: Secondary | ICD-10-CM | POA: Diagnosis not present

## 2023-10-27 DIAGNOSIS — K56600 Partial intestinal obstruction, unspecified as to cause: Secondary | ICD-10-CM | POA: Diagnosis not present

## 2023-10-27 DIAGNOSIS — R54 Age-related physical debility: Secondary | ICD-10-CM | POA: Diagnosis present

## 2023-10-27 DIAGNOSIS — Z515 Encounter for palliative care: Secondary | ICD-10-CM | POA: Diagnosis not present

## 2023-10-27 DIAGNOSIS — K219 Gastro-esophageal reflux disease without esophagitis: Secondary | ICD-10-CM | POA: Diagnosis not present

## 2023-10-27 DIAGNOSIS — Z95828 Presence of other vascular implants and grafts: Secondary | ICD-10-CM

## 2023-10-27 DIAGNOSIS — K5669 Other partial intestinal obstruction: Secondary | ICD-10-CM | POA: Diagnosis not present

## 2023-10-27 DIAGNOSIS — Z716 Tobacco abuse counseling: Secondary | ICD-10-CM | POA: Diagnosis not present

## 2023-10-27 DIAGNOSIS — C786 Secondary malignant neoplasm of retroperitoneum and peritoneum: Secondary | ICD-10-CM | POA: Diagnosis not present

## 2023-10-27 DIAGNOSIS — N1831 Chronic kidney disease, stage 3a: Secondary | ICD-10-CM | POA: Diagnosis not present

## 2023-10-27 DIAGNOSIS — K8020 Calculus of gallbladder without cholecystitis without obstruction: Secondary | ICD-10-CM | POA: Diagnosis not present

## 2023-10-27 DIAGNOSIS — R066 Hiccough: Secondary | ICD-10-CM | POA: Diagnosis present

## 2023-10-27 DIAGNOSIS — F419 Anxiety disorder, unspecified: Secondary | ICD-10-CM | POA: Diagnosis not present

## 2023-10-27 DIAGNOSIS — Z800 Family history of malignant neoplasm of digestive organs: Secondary | ICD-10-CM | POA: Diagnosis not present

## 2023-10-27 DIAGNOSIS — F17200 Nicotine dependence, unspecified, uncomplicated: Secondary | ICD-10-CM | POA: Diagnosis not present

## 2023-10-27 DIAGNOSIS — R1084 Generalized abdominal pain: Secondary | ICD-10-CM | POA: Diagnosis not present

## 2023-10-27 DIAGNOSIS — Z4682 Encounter for fitting and adjustment of non-vascular catheter: Secondary | ICD-10-CM | POA: Diagnosis not present

## 2023-10-27 DIAGNOSIS — E8809 Other disorders of plasma-protein metabolism, not elsewhere classified: Secondary | ICD-10-CM | POA: Diagnosis not present

## 2023-10-27 DIAGNOSIS — Z66 Do not resuscitate: Secondary | ICD-10-CM | POA: Diagnosis not present

## 2023-10-27 DIAGNOSIS — K56699 Other intestinal obstruction unspecified as to partial versus complete obstruction: Secondary | ICD-10-CM | POA: Diagnosis not present

## 2023-10-27 DIAGNOSIS — I10 Essential (primary) hypertension: Secondary | ICD-10-CM | POA: Diagnosis not present

## 2023-10-27 DIAGNOSIS — Z9221 Personal history of antineoplastic chemotherapy: Secondary | ICD-10-CM | POA: Diagnosis not present

## 2023-10-27 DIAGNOSIS — Z79890 Hormone replacement therapy (postmenopausal): Secondary | ICD-10-CM | POA: Diagnosis not present

## 2023-10-27 DIAGNOSIS — R198 Other specified symptoms and signs involving the digestive system and abdomen: Secondary | ICD-10-CM

## 2023-10-27 DIAGNOSIS — E871 Hypo-osmolality and hyponatremia: Secondary | ICD-10-CM | POA: Diagnosis not present

## 2023-10-27 MED ORDER — HEPARIN SOD (PORK) LOCK FLUSH 100 UNIT/ML IV SOLN
500.0000 [IU] | Freq: Once | INTRAVENOUS | Status: AC
Start: 2023-10-27 — End: 2023-10-27
  Administered 2023-10-27: 500 [IU] via INTRAVENOUS

## 2023-10-27 MED ORDER — SODIUM CHLORIDE 0.9% FLUSH
10.0000 mL | Freq: Once | INTRAVENOUS | Status: AC
Start: 2023-10-27 — End: 2023-10-27
  Administered 2023-10-27: 10 mL via INTRAVENOUS

## 2023-10-27 MED ORDER — SODIUM CHLORIDE 0.9 % IV SOLN: INTRAVENOUS | Status: DC

## 2023-10-27 NOTE — Patient Instructions (Addendum)

## 2023-10-27 NOTE — Telephone Encounter (Signed)
Call from [facility] for medical records for hospice referral. Asking for prognosis. Informed them that prognosis is 6 months or less per Dr. [name] and he will be the attending. Records faxed to [phone]

## 2023-10-28 ENCOUNTER — Emergency Department (HOSPITAL_COMMUNITY): Payer: BC Managed Care – PPO

## 2023-10-28 ENCOUNTER — Telehealth: Payer: Self-pay | Admitting: *Deleted

## 2023-10-28 ENCOUNTER — Encounter (HOSPITAL_COMMUNITY): Payer: Self-pay

## 2023-10-28 ENCOUNTER — Inpatient Hospital Stay (HOSPITAL_COMMUNITY)
Admission: EM | Admit: 2023-10-28 | Discharge: 2023-11-02 | DRG: 375 | Disposition: A | Discharge status: Hospice | Payer: BC Managed Care – PPO | Attending: Internal Medicine | Admitting: Internal Medicine

## 2023-10-28 ENCOUNTER — Other Ambulatory Visit: Payer: Self-pay

## 2023-10-28 DIAGNOSIS — G8929 Other chronic pain: Secondary | ICD-10-CM | POA: Diagnosis present

## 2023-10-28 DIAGNOSIS — Z932 Ileostomy status: Secondary | ICD-10-CM | POA: Diagnosis not present

## 2023-10-28 DIAGNOSIS — Z66 Do not resuscitate: Secondary | ICD-10-CM | POA: Diagnosis present

## 2023-10-28 DIAGNOSIS — E8809 Other disorders of plasma-protein metabolism, not elsewhere classified: Secondary | ICD-10-CM | POA: Diagnosis present

## 2023-10-28 DIAGNOSIS — Z800 Family history of malignant neoplasm of digestive organs: Secondary | ICD-10-CM | POA: Diagnosis not present

## 2023-10-28 DIAGNOSIS — K219 Gastro-esophageal reflux disease without esophagitis: Secondary | ICD-10-CM | POA: Diagnosis present

## 2023-10-28 DIAGNOSIS — K8020 Calculus of gallbladder without cholecystitis without obstruction: Secondary | ICD-10-CM | POA: Diagnosis not present

## 2023-10-28 DIAGNOSIS — Z7189 Other specified counseling: Secondary | ICD-10-CM | POA: Diagnosis not present

## 2023-10-28 DIAGNOSIS — Z923 Personal history of irradiation: Secondary | ICD-10-CM

## 2023-10-28 DIAGNOSIS — F17200 Nicotine dependence, unspecified, uncomplicated: Secondary | ICD-10-CM | POA: Diagnosis present

## 2023-10-28 DIAGNOSIS — Z79890 Hormone replacement therapy (postmenopausal): Secondary | ICD-10-CM | POA: Diagnosis not present

## 2023-10-28 DIAGNOSIS — R1084 Generalized abdominal pain: Secondary | ICD-10-CM | POA: Diagnosis present

## 2023-10-28 DIAGNOSIS — R066 Hiccough: Secondary | ICD-10-CM | POA: Diagnosis present

## 2023-10-28 DIAGNOSIS — C786 Secondary malignant neoplasm of retroperitoneum and peritoneum: Secondary | ICD-10-CM | POA: Diagnosis not present

## 2023-10-28 DIAGNOSIS — R54 Age-related physical debility: Secondary | ICD-10-CM | POA: Diagnosis present

## 2023-10-28 DIAGNOSIS — C20 Malignant neoplasm of rectum: Secondary | ICD-10-CM | POA: Diagnosis not present

## 2023-10-28 DIAGNOSIS — K56609 Unspecified intestinal obstruction, unspecified as to partial versus complete obstruction: Principal | ICD-10-CM

## 2023-10-28 DIAGNOSIS — R6339 Other feeding difficulties: Secondary | ICD-10-CM | POA: Diagnosis not present

## 2023-10-28 DIAGNOSIS — E871 Hypo-osmolality and hyponatremia: Secondary | ICD-10-CM | POA: Diagnosis present

## 2023-10-28 DIAGNOSIS — R180 Malignant ascites: Secondary | ICD-10-CM | POA: Diagnosis present

## 2023-10-28 DIAGNOSIS — F419 Anxiety disorder, unspecified: Secondary | ICD-10-CM | POA: Diagnosis present

## 2023-10-28 DIAGNOSIS — K56699 Other intestinal obstruction unspecified as to partial versus complete obstruction: Secondary | ICD-10-CM | POA: Diagnosis present

## 2023-10-28 DIAGNOSIS — C800 Disseminated malignant neoplasm, unspecified: Secondary | ICD-10-CM | POA: Diagnosis not present

## 2023-10-28 DIAGNOSIS — Z515 Encounter for palliative care: Secondary | ICD-10-CM | POA: Diagnosis not present

## 2023-10-28 DIAGNOSIS — R7989 Other specified abnormal findings of blood chemistry: Secondary | ICD-10-CM | POA: Diagnosis present

## 2023-10-28 DIAGNOSIS — K5669 Other partial intestinal obstruction: Secondary | ICD-10-CM | POA: Diagnosis not present

## 2023-10-28 DIAGNOSIS — Z716 Tobacco abuse counseling: Secondary | ICD-10-CM | POA: Diagnosis not present

## 2023-10-28 DIAGNOSIS — Z79899 Other long term (current) drug therapy: Secondary | ICD-10-CM

## 2023-10-28 DIAGNOSIS — N1831 Chronic kidney disease, stage 3a: Secondary | ICD-10-CM | POA: Diagnosis not present

## 2023-10-28 DIAGNOSIS — Z4682 Encounter for fitting and adjustment of non-vascular catheter: Secondary | ICD-10-CM | POA: Diagnosis not present

## 2023-10-28 DIAGNOSIS — K6289 Other specified diseases of anus and rectum: Secondary | ICD-10-CM | POA: Diagnosis present

## 2023-10-28 DIAGNOSIS — G620 Drug-induced polyneuropathy: Secondary | ICD-10-CM | POA: Diagnosis present

## 2023-10-28 DIAGNOSIS — Z9221 Personal history of antineoplastic chemotherapy: Secondary | ICD-10-CM | POA: Diagnosis not present

## 2023-10-28 LAB — COMPREHENSIVE METABOLIC PANEL
ALT: 17 U/L (ref 0–44)
AST: 23 U/L (ref 15–41)
Albumin: 3.6 g/dL (ref 3.5–5.0)
Alkaline Phosphatase: 151 U/L — ABNORMAL HIGH (ref 38–126)
Anion gap: 11 (ref 5–15)
BUN: 13 mg/dL (ref 8–23)
CO2: 24 mmol/L (ref 22–32)
Calcium: 9.2 mg/dL (ref 8.9–10.3)
Chloride: 98 mmol/L (ref 98–111)
Creatinine, Ser: 1.34 mg/dL — ABNORMAL HIGH (ref 0.61–1.24)
GFR, Estimated: 60 mL/min — ABNORMAL LOW (ref >60)
Glucose, Bld: 131 mg/dL — ABNORMAL HIGH (ref 70–99)
Potassium: 3.8 mmol/L (ref 3.5–5.1)
Sodium: 133 mmol/L — ABNORMAL LOW (ref 135–145)
Total Bilirubin: 0.9 mg/dL (ref <1.2)
Total Protein: 7 g/dL (ref 6.5–8.1)

## 2023-10-28 LAB — CBC
HCT: 45.9 % (ref 39.0–52.0)
Hemoglobin: 14.9 g/dL (ref 13.0–17.0)
MCH: 27.7 pg (ref 26.0–34.0)
MCHC: 32.5 g/dL (ref 30.0–36.0)
MCV: 85.3 fL (ref 80.0–100.0)
Platelets: 226 10*3/uL (ref 150–400)
RBC: 5.38 MIL/uL (ref 4.22–5.81)
RDW: 13.6 % (ref 11.5–15.5)
WBC: 8.8 10*3/uL (ref 4.0–10.5)
nRBC: 0 % (ref 0.0–0.2)

## 2023-10-28 LAB — URINALYSIS, ROUTINE W REFLEX MICROSCOPIC
Bilirubin Urine: NEGATIVE
Glucose, UA: NEGATIVE mg/dL
Hgb urine dipstick: NEGATIVE
Ketones, ur: 5 mg/dL — AB
Leukocytes,Ua: NEGATIVE
Nitrite: NEGATIVE
Protein, ur: NEGATIVE mg/dL
Specific Gravity, Urine: 1.039 — ABNORMAL HIGH (ref 1.005–1.030)
pH: 5 (ref 5.0–8.0)

## 2023-10-28 LAB — LIPASE, BLOOD: Lipase: 24 U/L (ref 11–51)

## 2023-10-28 MED ORDER — ONDANSETRON HCL 4 MG/2ML IJ SOLN
4.0000 mg | Freq: Four times a day (QID) | INTRAMUSCULAR | Status: DC | PRN
  Administered 2023-10-29 (×3): 4 mg via INTRAVENOUS
  Filled 2023-10-28 (×3): qty 2

## 2023-10-28 MED ORDER — PANTOPRAZOLE SODIUM 40 MG IV SOLR
40.0000 mg | INTRAVENOUS | Status: DC
Start: 2023-10-28 — End: 2023-11-02
  Administered 2023-10-29 – 2023-11-01 (×5): 40 mg via INTRAVENOUS
  Filled 2023-10-28 (×5): qty 10

## 2023-10-28 MED ORDER — MORPHINE SULFATE (PF) 4 MG/ML IV SOLN
4.0000 mg | Freq: Once | INTRAVENOUS | Status: AC
Start: 2023-10-28 — End: 2023-10-28
  Administered 2023-10-28: 4 mg via INTRAVENOUS
  Filled 2023-10-28: qty 1

## 2023-10-28 MED ORDER — DIPHENHYDRAMINE HCL 12.5 MG/5ML PO ELIX: 12.5000 mg | ORAL_SOLUTION | Freq: Four times a day (QID) | ORAL | Status: DC | PRN

## 2023-10-28 MED ORDER — LIDOCAINE HCL URETHRAL/MUCOSAL 2 % EX GEL
1.0000 | Freq: Once | CUTANEOUS | Status: DC
  Filled 2023-10-28: qty 5

## 2023-10-28 MED ORDER — FAMOTIDINE IN NACL 20-0.9 MG/50ML-% IV SOLN
20.0000 mg | Freq: Once | INTRAVENOUS | Status: AC
  Administered 2023-10-28: 20 mg via INTRAVENOUS
  Filled 2023-10-28: qty 50

## 2023-10-28 MED ORDER — NALOXONE HCL 0.4 MG/ML IJ SOLN: 0.4000 mg | INTRAMUSCULAR | Status: DC | PRN

## 2023-10-28 MED ORDER — KCL-LACTATED RINGERS-D5W 20 MEQ/L IV SOLN
INTRAVENOUS | Status: DC
Start: 2023-10-28 — End: 2023-10-29
  Filled 2023-10-28 (×2): qty 1000

## 2023-10-28 MED ORDER — DIAZEPAM 5 MG/ML IJ SOLN: 5.0000 mg | Freq: Once | INTRAMUSCULAR | Status: DC

## 2023-10-28 MED ORDER — ONDANSETRON HCL 4 MG/2ML IJ SOLN
4.0000 mg | Freq: Once | INTRAMUSCULAR | Status: AC
Start: 2023-10-28 — End: 2023-10-28
  Administered 2023-10-28: 4 mg via INTRAVENOUS
  Filled 2023-10-28: qty 2

## 2023-10-28 MED ORDER — ONDANSETRON HCL 4 MG/2ML IJ SOLN: 4.0000 mg | Freq: Four times a day (QID) | INTRAMUSCULAR | Status: DC | PRN

## 2023-10-28 MED ORDER — MELATONIN 5 MG PO TABS: 5.0000 mg | ORAL_TABLET | Freq: Every evening | ORAL | Status: DC | PRN

## 2023-10-28 MED ORDER — SODIUM CHLORIDE 0.9% FLUSH: 9.0000 mL | INTRAVENOUS | Status: DC | PRN

## 2023-10-28 MED ORDER — IOHEXOL 300 MG/ML  SOLN
100.0000 mL | Freq: Once | INTRAMUSCULAR | Status: AC | PRN
  Administered 2023-10-28: 100 mL via INTRAVENOUS

## 2023-10-28 MED ORDER — HYDROMORPHONE HCL 1 MG/ML IJ SOLN
0.5000 mg | INTRAMUSCULAR | Status: DC | PRN
  Administered 2023-10-29 – 2023-10-30 (×6): 0.5 mg via INTRAVENOUS
  Filled 2023-10-28 (×7): qty 0.5

## 2023-10-28 MED ORDER — DIPHENHYDRAMINE HCL 50 MG/ML IJ SOLN: 12.5000 mg | Freq: Four times a day (QID) | INTRAMUSCULAR | Status: DC | PRN

## 2023-10-28 MED ORDER — MORPHINE SULFATE (PF) 4 MG/ML IV SOLN
4.0000 mg | Freq: Once | INTRAVENOUS | Status: AC
  Administered 2023-10-28: 4 mg via INTRAVENOUS
  Filled 2023-10-28: qty 1

## 2023-10-28 MED ORDER — HYDROMORPHONE 1 MG/ML IV SOLN: INTRAVENOUS | Status: DC

## 2023-10-28 MED ORDER — ALBUTEROL SULFATE (2.5 MG/3ML) 0.083% IN NEBU: 3.0000 mL | INHALATION_SOLUTION | Freq: Four times a day (QID) | RESPIRATORY_TRACT | Status: DC | PRN

## 2023-10-28 NOTE — Telephone Encounter (Addendum)
[name] called to report that Kenneth [name]'s stomach heartburn/burning is not better, however only started the Protonix 20 mg daily on 12/10 (informed her it can take a few days for it to start working). Not taking any Mylanta or Tums, just Gas-X. Occasional nausea with no vomiting. Feels his abdomen is more distended than it was on Friday and has had no stool output since last night, which is very unusual for him. Having small amount of stool from rectum. Tramadol helps the rectal pain, but is  not helping his stomach pain that is constant ache w/intermittent waves of sharp pain located from his belt line to lower chest.  Hospice not scheduled to see him till 12/16. Per Dr. [name]: OK to increase protonix to bid and use Mylanta in between doses. Concerned he is developing an obstruction. Liquid diet only and can try MiraLax in liquids. If he does not have BM by lunch through is ostomy, he needs to go to ER for possible admission. Nothing that can be done to help this situation in the office. Hospital could get him comfortable on pain regimen and transition him back home for Hospice to then assist. Go to [facility] ER and not [facility] [name] understands and agrees.

## 2023-10-28 NOTE — H&P (Signed)
History and Physical    Patient: Kenneth [name] UEA:[id number] DOB: [birth year] DOA: 10/28/2023 DOS: the patient was seen and examined on 10/28/2023 PCP: [name], MD  Patient coming from: Home  Chief Complaint:  Chief Complaint  Patient presents with   Constipation   Abdominal Pain   HPI: Kenneth [name] is a 62 y.o. male with medical history significant for terminal rectal cancer per wife's report.  He has malignant ascites, omental caking, and symptomatic rectal thickening and a hospice referral was placed last week.  He has rectal discharge of mucous and some blood up 5 times a day. Yesterday he started having increased gas and acid reflux.  It got much worse after eating.  He had no stool in his colostomy for 24 hours.  His wife called oncologist and was advised to try Miralax but if no bm to come to the ED. while he has not had a BM he did have some gas expressed into his colostomy bag after the MiraLAX.  And that did help to relieve the pressure discomfort that he was feeling.  At the time of my evaluation he rated his abdominal pressure discomfort at 2.  He does have a stronger pain in his rectum.  He takes tramadol for that at home which is usually effective.   Review of Systems: As mentioned in the history of present illness. All other systems reviewed and are negative. Past Medical History:  Diagnosis Date   Rectal cancer (HCC) 01/09/2022   Past Surgical History:  Procedure Laterality Date   ILEOSTOMY  01/15/2022   IR IMAGING GUIDED PORT INSERTION  02/12/2022   IR US GUIDE BX ASP/DRAIN  02/12/2022   Social History:  reports that he has been smoking. He has never used smokeless tobacco. He reports that he does not currently use alcohol. He reports that he does not currently use drugs.  No Known Allergies  Family History  Problem Relation Age of Onset   Breast cancer Mother    Leukemia Mother    Pancreatic cancer Father     Prior to Admission medications    Medication Sig Start Date End Date Taking? Authorizing Provider  acetaminophen (TYLENOL) 325 MG tablet Take by mouth. Tylenol PM for Sleep 10/07/22   [provider]  albuterol (PROVENTIL HFA) 108 (90 Base) MCG/ACT inhaler Inhale 2 puffs into the lungs every 6 (six) hours as needed for wheezing or shortness of breath. Patient not taking: Reported on 02/17/2023 12/26/22   [name], MD  B Complex Vitamins (B-COMPLEX/B-12 PO) Place 1 tablet under the tongue daily.    [provider]  Cholecalciferol (VITAMIN D3) 125 MCG (5000 UT) CAPS Take 1 capsule by mouth daily at 6 (six) AM. 2 daily    [provider]  DHEA 50 MG CAPS Take 100 mg by mouth daily.    [provider]  dicyclomine (BENTYL) 10 MG capsule Take 1 capsule (10 mg total) by mouth 3 (three) times daily as needed for spasms. Patient not taking: Reported on 10/23/2023 10/09/23   [name], MD  diphenoxylate-atropine (LOMOTIL) 2.5-0.025 MG tablet Take 2 tablets by mouth 4 (four) times daily as needed. 07/30/23   [name], MD  ergocalciferol, VITAMIN D2, (DRISDOL) 200 MCG/ML drops Take 2,000 Units by mouth daily.    [provider]  ferrous sulfate 325 (65 FE) MG tablet Take 325 mg by mouth daily with breakfast.    [provider]  HYDROcodone-acetaminophen (NORCO) 5-325  MG tablet Take 1-2 tablets by mouth every 6 (six) hours as needed for moderate pain (pain score 4-6). For pain not relieved with tramadol 10/23/23   Rana Snare, NP  lidocaine-prilocaine (EMLA) cream Apply to the affected area as needed. 04/10/23   Rana Snare, NP  loperamide (IMODIUM) 2 MG capsule Take 4 mg by mouth as needed for diarrhea or loose stools.    [provider]  melatonin (MELATONIN MAXIMUM STRENGTH) 5 MG TABS Take by mouth. 01/10/22   [provider]  Milk Thistle-Turmeric (SILYMARIN PO) Take 250 mg by mouth 2 (two) times daily. Milk Thistle    [provider]   Omega-3 Fatty Acids (OMEGA-3 CF PO) Take 5 mLs by mouth daily.    [provider]  pantoprazole (PROTONIX) 20 MG tablet Take 1 tablet (20 mg total) by mouth daily. 10/26/23   Rana Snare, NP  potassium chloride SA (KLOR-CON M) 20 MEQ tablet Take 1 tablet (20 mEq total) by mouth daily. 10/09/23   [name], MD  tadalafil (CIALIS) 5 MG tablet Take 1 tablet (5 mg total) by mouth daily as needed for erectile dysfunction. 02/18/23   [name], MD  traMADol (ULTRAM) 50 MG tablet Take 1 tablet (50 mg total) by mouth every 6 (six) hours as needed for pain. 10/26/23   Rana Snare, NP  Potassium Chloride ER 20 MEQ TBCR Take 1 tablet (20 MEQ) by mouth once daily. 03/17/22 07/23/22      Physical Exam: Vitals:   10/28/23 2130 10/28/23 2145 10/28/23 2200 10/28/23 2215  BP: (!) 142/90 139/87 (!) 138/90 135/85  Pulse: 69 73 72 72  Resp: 13 15 18 16   Temp:      TempSrc:      SpO2: 98% 95% 95% 98%  Weight:      Height:       Physical Exam:  General: No acute distress, well developed, well nourished HEENT: Normocephalic, atraumatic, PERRL Cardiovascular: Normal rate and rhythm. Distal pulses intact. Pulmonary: Normal pulmonary effort, normal breath sounds Gastrointestinal: Distended abdomen, soft, tender diffusely, absent bowel sounds Musculoskeletal:Normal ROM, no lower ext edema Skin: Skin is warm and dry. Neuro: No focal deficits noted, AAOx3. PSYCH: Attentive and cooperative   Data Reviewed:  Results for orders placed or performed during the hospital encounter of 10/28/23 (from the past 24 hour(s))  Lipase, blood     Status: None   Collection Time: 10/28/23  4:44 PM  Result Value Ref Range   Lipase 24 11 - 51 U/L  Comprehensive metabolic panel     Status: Abnormal   Collection Time: 10/28/23  4:44 PM  Result Value Ref Range   Sodium 133 (L) 135 - 145 mmol/L   Potassium 3.8 3.5 - 5.1 mmol/L   Chloride 98 98 - 111 mmol/L   CO2 24 22 - 32 mmol/L   Glucose, Bld 131  (H) 70 - 99 mg/dL   BUN 13 8 - 23 mg/dL   Creatinine, Ser 1.61 (H) 0.61 - 1.24 mg/dL   Calcium 9.2 8.9 - 09.6 mg/dL   Total Protein 7.0 6.5 - 8.1 g/dL   Albumin 3.6 3.5 - 5.0 g/dL   AST 23 15 - 41 U/L   ALT 17 0 - 44 U/L   Alkaline Phosphatase 151 (H) 38 - 126 U/L   Total Bilirubin 0.9 <1.2 mg/dL   GFR, Estimated 60 (L) >60 mL/min   Anion gap 11 5 - 15  CBC  Status: None   Collection Time: 10/28/23  4:44 PM  Result Value Ref Range   WBC 8.8 4.0 - 10.5 K/uL   RBC 5.38 4.22 - 5.81 MIL/uL   Hemoglobin 14.9 13.0 - 17.0 g/dL   HCT 16.1 09.6 - 04.5 %   MCV 85.3 80.0 - 100.0 fL   MCH 27.7 26.0 - 34.0 pg   MCHC 32.5 30.0 - 36.0 g/dL   RDW 40.9 81.1 - 91.4 %   Platelets 226 150 - 400 K/uL   nRBC 0.0 0.0 - 0.2 %  Urinalysis, Routine w reflex microscopic -Urine, Clean Catch     Status: Abnormal   Collection Time: 10/28/23  6:17 PM  Result Value Ref Range   Color, Urine YELLOW YELLOW   APPearance CLEAR CLEAR   Specific Gravity, Urine 1.039 (H) 1.005 - 1.030   pH 5.0 5.0 - 8.0   Glucose, UA NEGATIVE NEGATIVE mg/dL   Hgb urine dipstick NEGATIVE NEGATIVE   Bilirubin Urine NEGATIVE NEGATIVE   Ketones, ur 5 (A) NEGATIVE mg/dL   Protein, ur NEGATIVE NEGATIVE mg/dL   Nitrite NEGATIVE NEGATIVE   Leukocytes,Ua NEGATIVE NEGATIVE   CT IMPRESSION: 1. Findings consistent with progression of metastatic rectal cancer. Progressive annular mural thickening of the rectum consistent with worsening primary disease, with interval worsening of omental caking and development of significant malignant ascites. 2. Small bowel obstruction, transition point in the mid to distal jejunum likely as result of omental caking or postsurgical adhesions. 3. Cirrhotic morphology of the liver, with numerous subcentimeter hypodensities throughout the liver parenchyma compatible with cysts. Given findings elsewhere within the abdomen and pelvis, hepatic metastases would be difficult to exclude. 4. Cholelithiasis  without evidence of cholecystitis. 5. Small left pleural effusion.   Assessment and Plan: SBO -the surgeon has recommended an NG tube but the patient is declining at this time.  He has had a little bit of gas output in his colostomy bag and the abdominal discomfort has decreased.  He would like to avoid the discomfort of having the NG tube placed if possible.  He would prefer to just have pain medication as needed.  Metastatic rectal cancer with peritoneal carcinomatosis - Hospice referral had been made prior to this SBO.   - Consult Dr [name] and palliative care in the am. -The patient is n.p.o, will set up a PCA for both his chronic rectal pain as well as the abdominal pressure from the SBO.  He was getting IV fluids twice a week as an outpatient.  Will go ahead and order IV fluids for now.   Advance Care Planning:   Code Status: Do not attempt resuscitation (DNR) - Comfort care  He names his wife as his HCPOA.  Consults: general surgery  Family Communication: Wife at bedside  Severity of Illness: The appropriate patient status for this patient is INPATIENT. Inpatient status is judged to be reasonable and necessary in order to provide the required intensity of service to ensure the patient's safety. The patient's presenting symptoms, physical exam findings, and initial radiographic and laboratory data in the context of their chronic comorbidities is felt to place them at high risk for further clinical deterioration. Furthermore, it is not anticipated that the patient will be medically stable for discharge from the hospital within 2 midnights of admission.   * I certify that at the point of admission it is my clinical judgment that the patient will require inpatient hospital care spanning beyond 2 midnights from the point of admission due  to high intensity of service, high risk for further deterioration and high frequency of surveillance required.*  Author: Buena [name],  MD 10/28/2023 10:49 PM  For on call review [email or web address].

## 2023-10-28 NOTE — Telephone Encounter (Signed)
Kenneth [name] called to report he has just had about 1/2 bag of stool output from his ostomy. He feels a bit better and Mylanta has helped. Asking how long to wait before going to ER if he stops having output. How long should he continue liquid diet. Asking about IV fluids--Hospice said no IV fluids, but this is being reviewed by the medical director. She is requesting to speak with Kenneth [name]. She is confused and has multiple questions about this process.

## 2023-10-28 NOTE — ED Provider Notes (Signed)
[place] EMERGENCY DEPARTMENT AT [facility] Provider Note   CSN: [id number] Arrival date & time: 10/28/23  1551     History  Chief Complaint  Patient presents with   Constipation   Abdominal Pain    Kenneth [name] is a 62 y.o. male hx of rectal cancer s/p colostomy here presenting with abdominal distention and nausea and no stool output in the colostomy.  Patient states that his last stool output from the colostomy was yesterday.  He states that he felt nauseated.  He states that he also has abdominal distention.  Patient was sent in by Dr. [name] for rule out obstruction.  Patient is supposed to see palliative care next week.  The history is provided by the patient.       Home Medications Prior to Admission medications   Medication Sig Start Date End Date Taking? Authorizing Provider  acetaminophen (TYLENOL) 325 MG tablet Take by mouth. Tylenol PM for Sleep 10/07/22   [provider]  albuterol (PROVENTIL HFA) 108 (90 Base) MCG/ACT inhaler Inhale 2 puffs into the lungs every 6 (six) hours as needed for wheezing or shortness of breath. Patient not taking: Reported on 02/17/2023 12/26/22   [name], MD  B Complex Vitamins (B-COMPLEX/B-12 PO) Place 1 tablet under the tongue daily.    [provider]  Cholecalciferol (VITAMIN D3) 125 MCG (5000 UT) CAPS Take 1 capsule by mouth daily at 6 (six) AM. 2 daily    [provider]  DHEA 50 MG CAPS Take 100 mg by mouth daily.    [provider]  dicyclomine (BENTYL) 10 MG capsule Take 1 capsule (10 mg total) by mouth 3 (three) times daily as needed for spasms. Patient not taking: Reported on 10/23/2023 10/09/23   [name], MD  diphenoxylate-atropine (LOMOTIL) 2.5-0.025 MG tablet Take 2 tablets by mouth 4 (four) times daily as needed. 07/30/23   [name], MD  ergocalciferol, VITAMIN D2, (DRISDOL) 200 MCG/ML drops Take 2,000 Units by mouth daily.    [provider]  ferrous sulfate 325 (65 FE) MG tablet Take 325 mg by mouth daily with breakfast.    [provider]  HYDROcodone-acetaminophen (NORCO) 5-325 MG tablet Take 1-2 tablets by mouth every 6 (six) hours as needed for moderate pain (pain score 4-6). For pain not relieved with tramadol 10/23/23   Rana Snare, NP  lidocaine-prilocaine (EMLA) cream Apply to the affected area as needed. 04/10/23   Rana Snare, NP  loperamide (IMODIUM) 2 MG capsule Take 4 mg by mouth as needed for diarrhea or loose stools.    [provider]  melatonin (MELATONIN MAXIMUM STRENGTH) 5 MG TABS Take by mouth. 01/10/22   [provider]  Milk Thistle-Turmeric (SILYMARIN PO) Take 250 mg by mouth 2 (two) times daily. Milk Thistle    [provider]  Omega-3 Fatty Acids (OMEGA-3 CF PO) Take 5 mLs by mouth daily.    [provider]  pantoprazole (PROTONIX) 20 MG tablet Take 1 tablet (20 mg total) by mouth daily. 10/26/23   Rana Snare, NP  potassium chloride SA (KLOR-CON M) 20 MEQ tablet Take 1 tablet (20 mEq total) by mouth daily. 10/09/23   [name], MD  tadalafil (CIALIS) 5 MG tablet Take 1 tablet (5 mg total) by mouth daily as needed for erectile dysfunction. 02/18/23   [name], MD  traMADol (ULTRAM) 50 MG tablet Take 1 tablet (50 mg total) by  mouth every 6 (six) hours as needed for pain. 10/26/23   Rana Snare, NP  Potassium Chloride ER 20 MEQ TBCR Take 1 tablet (20 MEQ) by mouth once daily. 03/17/22 07/23/22        Allergies    Patient has no known allergies.    Review of Systems   Review of Systems  Gastrointestinal:  Positive for abdominal pain, constipation and nausea.  All other systems reviewed and are negative.   Physical Exam Updated Vital Signs BP 126/88   Pulse 66   Temp 98.2 F (36.8 C) (Oral)   Resp 15   Ht 5\' 11"  (1.803 m)   Wt 90 kg   SpO2 99%   BMI 27.67 kg/m  Physical Exam Vitals and nursing note reviewed.  Constitutional:       Comments: Chronically ill-appearing  HENT:     Head: Normocephalic.  Eyes:     Extraocular Movements: Extraocular movements intact.     Pupils: Pupils are equal, round, and reactive to light.  Cardiovascular:     Rate and Rhythm: Normal rate and regular rhythm.     Heart sounds: Normal heart sounds.  Pulmonary:     Effort: Pulmonary effort is normal.     Breath sounds: Normal breath sounds.  Abdominal:     Comments: + Distended and mild diffuse tenderness.  Patient does have a colostomy on the right lower quadrant  Skin:    General: Skin is warm.     Capillary Refill: Capillary refill takes less than 2 seconds.  Neurological:     General: No focal deficit present.     Mental Status: He is oriented to person, place, and time.  Psychiatric:        Mood and Affect: Mood normal.        Behavior: Behavior normal.     ED Results / Procedures / Treatments   Labs (all labs ordered are listed, but only abnormal results are displayed) Labs Reviewed  COMPREHENSIVE METABOLIC PANEL - Abnormal; Notable for the following components:      Result Value   Sodium 133 (*)    Glucose, Bld 131 (*)    Creatinine, Ser 1.34 (*)    Alkaline Phosphatase 151 (*)    GFR, Estimated 60 (*)    All other components within normal limits  URINALYSIS, ROUTINE W REFLEX MICROSCOPIC - Abnormal; Notable for the following components:   Specific Gravity, Urine 1.039 (*)    Ketones, ur 5 (*)    All other components within normal limits  LIPASE, BLOOD  CBC    EKG EKG Interpretation Date/Time:  Wednesday October 28 2023 16:20:58 EST Ventricular Rate:  74 PR Interval:  163 QRS Duration:  108 QT Interval:  393 QTC Calculation: 436 R Axis:   232  Text Interpretation: Sinus rhythm Markedly posterior QRS axis No previous ECGs available Confirmed by Richardean Canal [phone]) on 10/28/2023 4:44:09 PM  Radiology CT ABDOMEN PELVIS W CONTRAST  Result Date: 10/28/2023 CLINICAL DATA:  Abdominal pain, constipation,  history of rectal cancer EXAM: CT ABDOMEN AND PELVIS WITH CONTRAST TECHNIQUE: Multidetector CT imaging of the abdomen and pelvis was performed using the standard protocol following bolus administration of intravenous contrast. RADIATION DOSE REDUCTION: This exam was performed according to the departmental dose-optimization program which includes automated exposure control, adjustment of the mA and/or kV according to patient size and/or use of iterative reconstruction technique. CONTRAST:  OMNIPAQUE IOHEXOL 300 MG/ML  SOLN COMPARISON:  07/02/2023 FINDINGS: Lower  chest: Trace left pleural effusion. Minimal left lower lobe atelectasis. Hepatobiliary: Subtle nodular contour of the liver capsule more pronounced on this contrast exam. Numerous subcentimeter hypodensities are seen throughout the liver parenchyma, not grossly changed since the 2023 study and most consistent with hepatic cysts. Given findings elsewhere within the abdomen and pelvis, metastatic disease would be difficult to exclude. Gallbladder is decompressed, with stable calcified gallstones. No evidence of acute cholecystitis. Pancreas: Unremarkable. No pancreatic ductal dilatation or surrounding inflammatory changes. Spleen: Stable borderline splenomegaly. No focal parenchymal abnormality. Adrenals/Urinary Tract: There are multiple bilateral simple appearing renal cortical cysts. Stable complex hemorrhagic cyst lower pole left kidney image 43/2 measuring 1.6 cm. No specific imaging follow-up is required. No urinary tract calculi or obstructive uropathy. The adrenals and bladder are unremarkable. Stomach/Bowel: Since the previous staging exam, circumferential wall thickening of the rectum has progressed, measuring up to 15 mm in thickness reference image 80/2. Multiple loops of dilated jejunum are seen within the central abdomen, measuring up to 3.7 cm in diameter, consistent with small-bowel obstruction. Transition point is seen within the distal  jejunum within the lower central abdomen, compatible small bowel obstruction likely due to postsurgical adhesions or omental caking. Stable right upper quadrant enterostomy. Vascular/Lymphatic: No significant vascular findings. No discrete adenopathy within the abdomen or pelvis. Reproductive: Prostate is unremarkable. Other: Peritoneal carcinomatosis and omental caking again noted throughout the abdomen and pelvis, with interval development of moderate ascites. No free intraperitoneal gas. No abdominal wall hernia. Musculoskeletal: No acute or destructive bony abnormalities. Reconstructed images demonstrate no additional findings. IMPRESSION: 1. Findings consistent with progression of metastatic rectal cancer. Progressive annular mural thickening of the rectum consistent with worsening primary disease, with interval worsening of omental caking and development of significant malignant ascites. 2. Small bowel obstruction, transition point in the mid to distal jejunum likely as result of omental caking or postsurgical adhesions. 3. Cirrhotic morphology of the liver, with numerous subcentimeter hypodensities throughout the liver parenchyma compatible with cysts. Given findings elsewhere within the abdomen and pelvis, hepatic metastases would be difficult to exclude. 4. Cholelithiasis without evidence of cholecystitis. 5. Small left pleural effusion. Electronically Signed   By: Sharlet Salina M.D.   On: 10/28/2023 19:10    Procedures Procedures    Medications Ordered in ED Medications  diazepam (VALIUM) injection 5 mg (has no administration in time range)  morphine (PF) 4 MG/ML injection 4 mg (4 mg Intravenous Given 10/28/23 1705)  ondansetron (ZOFRAN) injection 4 mg (4 mg Intravenous Given 10/28/23 1702)  famotidine (PEPCID) IVPB 20 mg premix (0 mg Intravenous Stopped 10/28/23 1740)  iohexol (OMNIPAQUE) 300 MG/ML solution 100 mL (100 mLs Intravenous Contrast Given 10/28/23 1746)    ED Course/ Medical  Decision Making/ A&P                                 Medical Decision Making Kenneth [name] is a 62 y.o. male here presenting with abdominal distention and no stool output in the colostomy.  Patient likely has ileus versus small bowel obstruction.  Plan to get labs and CT abdomen pelvis.  Will give pain medicine and nausea medicine and reassess.  8:00 PM I reviewed patient's labs and independently reviewed CT scan.  CT showed progression of metastatic rectal cancer with small bowel obstruction.  Discussed case with Dr. Luisa Hart from surgery.  He recommend NG tube.  Patient is also having discussion about hospice.  I think  he will be beneficial for him if palliative care sees patient in the hospital.  At this point patient will be admitted for small bowel obstruction under the hospitalist service.  Problems Addressed: Rectal cancer [facility]): chronic illness or injury with exacerbation, progression, or side effects of treatment SBO (small bowel obstruction) (HCC): acute illness or injury  Amount and/or Complexity of Data Reviewed Labs: ordered. Decision-making details documented in ED Course. Radiology: ordered and independent interpretation performed. Decision-making details documented in ED Course.  Risk Prescription drug management. Decision regarding hospitalization.    Final Clinical Impression(s) / ED Diagnoses Final diagnoses:  None    Rx / DC Orders ED Discharge Orders     None         Charlynne Pander, MD 10/28/23 2003

## 2023-10-28 NOTE — ED Triage Notes (Signed)
Generalized abdominal pain for 2 days. Pt has ileostomy and states he has had no output in 17 hours. C/o heartburn.  1 episode of vomiting PTA.

## 2023-10-28 NOTE — ED Notes (Signed)
.ED TO INPATIENT HANDOFF REPORT  ED Nurse Name and Phone #: Norm Salt EMT-P  S Name/Age/Gender Kenneth [name] 62 y.o. male Room/Bed: RESB/RESB  Code Status   Code Status: Do not attempt resuscitation (DNR) - Comfort care  Home/SNF/Other Home Patient oriented HQ:[facility] place time event Is this baseline? Yes   Triage Complete: Triage complete  Chief Complaint SBO (small bowel obstruction) (HCC) [K56.609]  Triage Note Generalized abdominal pain for 2 days. Pt has ileostomy and states he has had no output in 17 hours. C/o heartburn.  1 episode of vomiting PTA.    Allergies No Known Allergies  Level of Care/Admitting Diagnosis ED Disposition     ED Disposition  Admit   Condition  --   Comment  H[facility]: [facility] [[id number]]  Level of Care: Med-Surg [16]  May admit patient to [facility] or [facility] if equivalent level of care is available:: No  Covid Evaluation: Asymptomatic - no recent exposure (last 10 days) testing not required  Diagnosis: SBO (small bowel obstruction) [facility]) [[id number]]  Admitting Physician: Buena [name] [[id number]]  Attending Physician: Buena [name] [phone]  Certification:: I certify this patient will need inpatient services for at least 2 midnights  Expected Medical Readiness: 10/30/2023          B Medical/Surgery History Past Medical History:  Diagnosis Date   Rectal cancer (HCC) 01/09/2022   Past Surgical History:  Procedure Laterality Date   ILEOSTOMY  01/15/2022   IR IMAGING GUIDED PORT INSERTION  02/12/2022   IR US GUIDE BX ASP/DRAIN  02/12/2022     A IV Location/Drains/Wounds Patient Lines/Drains/Airways Status     Active Line/Drains/Airways     Name Placement date Placement time Site Days   Implanted Port 02/12/22 Right Chest 02/12/22  1139  Chest  623   Wound / Incision (Open or Dehisced) 02/12/22 Other (Comment) Chest Right;Upper 02/12/22  1206  Chest  623   Wound / Incision (Open or  Dehisced) 02/12/22 Puncture Neck Right 02/12/22  1207  Neck  623   Wound / Incision (Open or Dehisced) 02/12/22 Puncture Neck Left 02/12/22  1221  Neck  623            Intake/Output Last 24 hours  Intake/Output Summary (Last 24 hours) at 10/28/2023 2212 Last data filed at 10/28/2023 1740 Gross per 24 hour  Intake 35.19 ml  Output --  Net 35.19 ml    Labs/Imaging Results for orders placed or performed during the hospital encounter of 10/28/23 (from the past 48 hour(s))  Lipase, blood     Status: None   Collection Time: 10/28/23  4:44 PM  Result Value Ref Range   Lipase 24 11 - 51 U/L    Comment: Performed at [facility], [address], [place], [place] [address]  Comprehensive metabolic panel     Status: Abnormal   Collection Time: 10/28/23  4:44 PM  Result Value Ref Range   Sodium 133 (L) 135 - 145 mmol/L   Potassium 3.8 3.5 - 5.1 mmol/L   Chloride 98 98 - 111 mmol/L   CO2 24 22 - 32 mmol/L   Glucose, Bld 131 (H) 70 - 99 mg/dL    Comment: Glucose reference range applies only to samples taken after fasting for at least 8 hours.   BUN 13 8 - 23 mg/dL   Creatinine, Ser 5.36 (H) 0.61 - 1.24 mg/dL   Calcium 9.2 8.9 - 64.4 mg/dL   Total Protein 7.0  6.5 - 8.1 g/dL   Albumin 3.6 3.5 - 5.0 g/dL   AST 23 15 - 41 U/L   ALT 17 0 - 44 U/L   Alkaline Phosphatase 151 (H) 38 - 126 U/L   Total Bilirubin 0.9 <1.2 mg/dL   GFR, Estimated 60 (L) >60 mL/min    Comment: (NOTE) Calculated using the CKD-EPI Creatinine Equation (2021)    Anion gap 11 5 - 15    Comment: Performed at [facility], [address] [place], [place] [address]  CBC     Status: None   Collection Time: 10/28/23  4:44 PM  Result Value Ref Range   WBC 8.8 4.0 - 10.5 K/uL   RBC 5.38 4.22 - 5.81 MIL/uL   Hemoglobin 14.9 13.0 - 17.0 g/dL   HCT 60.4 54.0 - 98.1 %   MCV 85.3 80.0 - 100.0 fL   MCH 27.7 26.0 - 34.0 pg   MCHC 32.5 30.0 - 36.0 g/dL   RDW 19.1 47.8 - 29.5 %    Platelets 226 150 - 400 K/uL   nRBC 0.0 0.0 - 0.2 %    Comment: Performed at [facility], [address] [place], [place] [address]  Urinalysis, Routine w reflex microscopic -Urine, Clean Catch     Status: Abnormal   Collection Time: 10/28/23  6:17 PM  Result Value Ref Range   Color, Urine YELLOW YELLOW   APPearance CLEAR CLEAR   Specific Gravity, Urine 1.039 (H) 1.005 - 1.030   pH 5.0 5.0 - 8.0   Glucose, UA NEGATIVE NEGATIVE mg/dL   Hgb urine dipstick NEGATIVE NEGATIVE   Bilirubin Urine NEGATIVE NEGATIVE   Ketones, ur 5 (A) NEGATIVE mg/dL   Protein, ur NEGATIVE NEGATIVE mg/dL   Nitrite NEGATIVE NEGATIVE   Leukocytes,Ua NEGATIVE NEGATIVE    Comment: Performed at [facility], [address], [place], [place] [address]   CT ABDOMEN PELVIS W CONTRAST  Result Date: 10/28/2023 CLINICAL DATA:  Abdominal pain, constipation, history of rectal cancer EXAM: CT ABDOMEN AND PELVIS WITH CONTRAST TECHNIQUE: Multidetector CT imaging of the abdomen and pelvis was performed using the standard protocol following bolus administration of intravenous contrast. RADIATION DOSE REDUCTION: This exam was performed according to the departmental dose-optimization program which includes automated exposure control, adjustment of the mA and/or kV according to patient size and/or use of iterative reconstruction technique. CONTRAST:  OMNIPAQUE IOHEXOL 300 MG/ML  SOLN COMPARISON:  07/02/2023 FINDINGS: Lower chest: Trace left pleural effusion. Minimal left lower lobe atelectasis. Hepatobiliary: Subtle nodular contour of the liver capsule more pronounced on this contrast exam. Numerous subcentimeter hypodensities are seen throughout the liver parenchyma, not grossly changed since the 2023 study and most consistent with hepatic cysts. Given findings elsewhere within the abdomen and pelvis, metastatic disease would be difficult to exclude. Gallbladder is decompressed, with stable  calcified gallstones. No evidence of acute cholecystitis. Pancreas: Unremarkable. No pancreatic ductal dilatation or surrounding inflammatory changes. Spleen: Stable borderline splenomegaly. No focal parenchymal abnormality. Adrenals/Urinary Tract: There are multiple bilateral simple appearing renal cortical cysts. Stable complex hemorrhagic cyst lower pole left kidney image 43/2 measuring 1.6 cm. No specific imaging follow-up is required. No urinary tract calculi or obstructive uropathy. The adrenals and bladder are unremarkable. Stomach/Bowel: Since the previous staging exam, circumferential wall thickening of the rectum has progressed, measuring up to 15 mm in thickness reference image 80/2. Multiple loops of dilated jejunum are seen within the central abdomen, measuring up to 3.7 cm  in diameter, consistent with small-bowel obstruction. Transition point is seen within the distal jejunum within the lower central abdomen, compatible small bowel obstruction likely due to postsurgical adhesions or omental caking. Stable right upper quadrant enterostomy. Vascular/Lymphatic: No significant vascular findings. No discrete adenopathy within the abdomen or pelvis. Reproductive: Prostate is unremarkable. Other: Peritoneal carcinomatosis and omental caking again noted throughout the abdomen and pelvis, with interval development of moderate ascites. No free intraperitoneal gas. No abdominal wall hernia. Musculoskeletal: No acute or destructive bony abnormalities. Reconstructed images demonstrate no additional findings. IMPRESSION: 1. Findings consistent with progression of metastatic rectal cancer. Progressive annular mural thickening of the rectum consistent with worsening primary disease, with interval worsening of omental caking and development of significant malignant ascites. 2. Small bowel obstruction, transition point in the mid to distal jejunum likely as result of omental caking or postsurgical adhesions. 3.  Cirrhotic morphology of the liver, with numerous subcentimeter hypodensities throughout the liver parenchyma compatible with cysts. Given findings elsewhere within the abdomen and pelvis, hepatic metastases would be difficult to exclude. 4. Cholelithiasis without evidence of cholecystitis. 5. Small left pleural effusion. Electronically Signed   By: Sharlet Salina M.D.   On: 10/28/2023 19:10    Pending Labs Unresulted Labs (From admission, onward)    None       Vitals/Pain Today's Vitals   10/28/23 1613 10/28/23 1708 10/28/23 1900 10/28/23 2017  BP:   126/88   Pulse:   66   Resp:   15   Temp:    98.7 F (37.1 C)  TempSrc:    Oral  SpO2:   99%   Weight: 198 lb 6.6 oz (90 kg)     Height: 5\' 11"  (1.803 m)     PainSc: 2  3       Isolation Precautions No active isolations  Medications Medications  HYDROmorphone (DILAUDID) injection 0.5 mg (has no administration in time range)  melatonin tablet 5 mg (has no administration in time range)  ondansetron (ZOFRAN) injection 4 mg (has no administration in time range)  lidocaine (XYLOCAINE) 2 % jelly 1 Application (has no administration in time range)  dextrose 5% in lactated ringers with KCl 20 mEq/L infusion (has no administration in time range)  morphine (PF) 4 MG/ML injection 4 mg (4 mg Intravenous Given 10/28/23 1705)  ondansetron (ZOFRAN) injection 4 mg (4 mg Intravenous Given 10/28/23 1702)  famotidine (PEPCID) IVPB 20 mg premix (0 mg Intravenous Stopped 10/28/23 1740)  iohexol (OMNIPAQUE) 300 MG/ML solution 100 mL (100 mLs Intravenous Contrast Given 10/28/23 1746)  morphine (PF) 4 MG/ML injection 4 mg (4 mg Intravenous Given 10/28/23 2109)    Mobility walks      R Recommendations: See Admitting Provider Note  Report given to:

## 2023-10-28 NOTE — Consult Note (Signed)
Reason for Consult: Small bowel obstruction Referring Physician: Silverio Lay MD  Kenneth [name] is an 62 y.o. male.  HPI: 62 year old male diagnosed with carcinomatosis from rectal carcinoma 1 year ago and treated by Dr. Durenda Hurt at [facility].  He underwent laparoscopy which was aborted and he has an ostomy now.  He refused any other treatments including intraperitoneal chemotherapy.  He has been followed by oncology.  Apparently his disease is progressing. He has had no ostomy output for couple of days.  He is increasing abdominal distention and CT findings concerning for small bowel obstruction secondary to progression of carcinomatosis.  He is followed by Dr. Mancel Bale. Past Medical History:  Diagnosis Date   Rectal cancer (HCC) 01/09/2022    Past Surgical History:  Procedure Laterality Date   ILEOSTOMY  01/15/2022   IR IMAGING GUIDED PORT INSERTION  02/12/2022   IR US GUIDE BX ASP/DRAIN  02/12/2022    Family History  Problem Relation Age of Onset   Breast cancer Mother    Leukemia Mother    Pancreatic cancer Father     Social History:  reports that he has been smoking. He has never used smokeless tobacco. He reports that he does not currently use alcohol. He reports that he does not currently use drugs.  Allergies: No Known Allergies  Medications: I have reviewed the patient's current medications.  Results for orders placed or performed during the hospital encounter of 10/28/23 (from the past 48 hour(s))  Lipase, blood     Status: None   Collection Time: 10/28/23  4:44 PM  Result Value Ref Range   Lipase 24 11 - 51 U/L    Comment: Performed at [facility], [address], [place], [place] [address]  Comprehensive metabolic panel     Status: Abnormal   Collection Time: 10/28/23  4:44 PM  Result Value Ref Range   Sodium 133 (L) 135 - 145 mmol/L   Potassium 3.8 3.5 - 5.1 mmol/L   Chloride 98 98 - 111 mmol/L   CO2 24 22 - 32 mmol/L   Glucose,  Bld 131 (H) 70 - 99 mg/dL    Comment: Glucose reference range applies only to samples taken after fasting for at least 8 hours.   BUN 13 8 - 23 mg/dL   Creatinine, Ser 0.98 (H) 0.61 - 1.24 mg/dL   Calcium 9.2 8.9 - 11.9 mg/dL   Total Protein 7.0 6.5 - 8.1 g/dL   Albumin 3.6 3.5 - 5.0 g/dL   AST 23 15 - 41 U/L   ALT 17 0 - 44 U/L   Alkaline Phosphatase 151 (H) 38 - 126 U/L   Total Bilirubin 0.9 <1.2 mg/dL   GFR, Estimated 60 (L) >60 mL/min    Comment: (NOTE) Calculated using the CKD-EPI Creatinine Equation (2021)    Anion gap 11 5 - 15    Comment: Performed at [facility], [address] [place], [place] [address]  CBC     Status: None   Collection Time: 10/28/23  4:44 PM  Result Value Ref Range   WBC 8.8 4.0 - 10.5 K/uL   RBC 5.38 4.22 - 5.81 MIL/uL   Hemoglobin 14.9 13.0 - 17.0 g/dL   HCT 95.6 21.3 - 08.6 %   MCV 85.3 80.0 - 100.0 fL   MCH 27.7 26.0 - 34.0 pg   MCHC 32.5 30.0 - 36.0 g/dL   RDW 57.8 46.9 - 62.9 %   Platelets 226 150 -  400 K/uL   nRBC 0.0 0.0 - 0.2 %    Comment: Performed at [facility], [address] [place], [place] [address]  Urinalysis, Routine w reflex microscopic -Urine, Clean Catch     Status: Abnormal   Collection Time: 10/28/23  6:17 PM  Result Value Ref Range   Color, Urine YELLOW YELLOW   APPearance CLEAR CLEAR   Specific Gravity, Urine 1.039 (H) 1.005 - 1.030   pH 5.0 5.0 - 8.0   Glucose, UA NEGATIVE NEGATIVE mg/dL   Hgb urine dipstick NEGATIVE NEGATIVE   Bilirubin Urine NEGATIVE NEGATIVE   Ketones, ur 5 (A) NEGATIVE mg/dL   Protein, ur NEGATIVE NEGATIVE mg/dL   Nitrite NEGATIVE NEGATIVE   Leukocytes,Ua NEGATIVE NEGATIVE    Comment: Performed at [facility], [address] [place], [place] [address]    CT ABDOMEN PELVIS W CONTRAST  Result Date: 10/28/2023 CLINICAL DATA:  Abdominal pain, constipation, history of rectal cancer EXAM: CT ABDOMEN AND PELVIS WITH CONTRAST  TECHNIQUE: Multidetector CT imaging of the abdomen and pelvis was performed using the standard protocol following bolus administration of intravenous contrast. RADIATION DOSE REDUCTION: This exam was performed according to the departmental dose-optimization program which includes automated exposure control, adjustment of the mA and/or kV according to patient size and/or use of iterative reconstruction technique. CONTRAST:  OMNIPAQUE IOHEXOL 300 MG/ML  SOLN COMPARISON:  07/02/2023 FINDINGS: Lower chest: Trace left pleural effusion. Minimal left lower lobe atelectasis. Hepatobiliary: Subtle nodular contour of the liver capsule more pronounced on this contrast exam. Numerous subcentimeter hypodensities are seen throughout the liver parenchyma, not grossly changed since the 2023 study and most consistent with hepatic cysts. Given findings elsewhere within the abdomen and pelvis, metastatic disease would be difficult to exclude. Gallbladder is decompressed, with stable calcified gallstones. No evidence of acute cholecystitis. Pancreas: Unremarkable. No pancreatic ductal dilatation or surrounding inflammatory changes. Spleen: Stable borderline splenomegaly. No focal parenchymal abnormality. Adrenals/Urinary Tract: There are multiple bilateral simple appearing renal cortical cysts. Stable complex hemorrhagic cyst lower pole left kidney image 43/2 measuring 1.6 cm. No specific imaging follow-up is required. No urinary tract calculi or obstructive uropathy. The adrenals and bladder are unremarkable. Stomach/Bowel: Since the previous staging exam, circumferential wall thickening of the rectum has progressed, measuring up to 15 mm in thickness reference image 80/2. Multiple loops of dilated jejunum are seen within the central abdomen, measuring up to 3.7 cm in diameter, consistent with small-bowel obstruction. Transition point is seen within the distal jejunum within the lower central abdomen, compatible small bowel  obstruction likely due to postsurgical adhesions or omental caking. Stable right upper quadrant enterostomy. Vascular/Lymphatic: No significant vascular findings. No discrete adenopathy within the abdomen or pelvis. Reproductive: Prostate is unremarkable. Other: Peritoneal carcinomatosis and omental caking again noted throughout the abdomen and pelvis, with interval development of moderate ascites. No free intraperitoneal gas. No abdominal wall hernia. Musculoskeletal: No acute or destructive bony abnormalities. Reconstructed images demonstrate no additional findings. IMPRESSION: 1. Findings consistent with progression of metastatic rectal cancer. Progressive annular mural thickening of the rectum consistent with worsening primary disease, with interval worsening of omental caking and development of significant malignant ascites. 2. Small bowel obstruction, transition point in the mid to distal jejunum likely as result of omental caking or postsurgical adhesions. 3. Cirrhotic morphology of the liver, with numerous subcentimeter hypodensities throughout the liver parenchyma compatible with cysts. Given findings elsewhere within the abdomen and pelvis, hepatic metastases would be difficult to exclude. 4.  Cholelithiasis without evidence of cholecystitis. 5. Small left pleural effusion. Electronically Signed   By: Sharlet Salina M.D.   On: 10/28/2023 19:10    Review of Systems  Gastrointestinal:  Positive for abdominal distention and abdominal pain.   Blood pressure 126/88, pulse 66, temperature 98.7 F (37.1 C), temperature source Oral, resp. rate 15, height 5\' 11"  (1.803 m), weight 90 kg, SpO2 99%. Physical Exam HENT:     Head: Normocephalic.  Cardiovascular:     Rate and Rhythm: Normal rate.  Pulmonary:     Effort: Pulmonary effort is normal.  Abdominal:     General: There is distension.     Tenderness: There is abdominal tenderness.  Skin:    General: Skin is warm.  Neurological:     Mental  Status: He is alert.  Psychiatric:        Mood and Affect: Mood normal.     Assessment/Plan: Carcinomatosis secondary to rectal carcinoma and aborted resection 1 year ago with ostomy placement  Small bowel obstruction noted on CT scan more and likely secondary to progression of carcinomatosis.  Recommend IV fluids, NG tube, medicine evaluation and/or admission and oncology consultation  Of note he has refused any other surgical management including HIPEC for his carcinomatosis.  Dennys Traughber A Francisco Ostrovsky 10/28/2023, 8:53 PM    High complexity

## 2023-10-29 ENCOUNTER — Inpatient Hospital Stay (HOSPITAL_COMMUNITY): Payer: BC Managed Care – PPO

## 2023-10-29 DIAGNOSIS — C786 Secondary malignant neoplasm of retroperitoneum and peritoneum: Secondary | ICD-10-CM | POA: Diagnosis not present

## 2023-10-29 DIAGNOSIS — G620 Drug-induced polyneuropathy: Secondary | ICD-10-CM | POA: Diagnosis not present

## 2023-10-29 DIAGNOSIS — K56609 Unspecified intestinal obstruction, unspecified as to partial versus complete obstruction: Secondary | ICD-10-CM | POA: Diagnosis not present

## 2023-10-29 DIAGNOSIS — C20 Malignant neoplasm of rectum: Secondary | ICD-10-CM | POA: Diagnosis not present

## 2023-10-29 LAB — CBC WITH DIFFERENTIAL/PLATELET
Abs Immature Granulocytes: 0.03 10*3/uL (ref 0.00–0.07)
Basophils Absolute: 0 10*3/uL (ref 0.0–0.1)
Basophils Relative: 0 %
Eosinophils Absolute: 0 10*3/uL (ref 0.0–0.5)
Eosinophils Relative: 0 %
HCT: 46.2 % (ref 39.0–52.0)
Hemoglobin: 15.2 g/dL (ref 13.0–17.0)
Immature Granulocytes: 0 %
Lymphocytes Relative: 5 %
Lymphs Abs: 0.6 10*3/uL — ABNORMAL LOW (ref 0.7–4.0)
MCH: 27.8 pg (ref 26.0–34.0)
MCHC: 32.9 g/dL (ref 30.0–36.0)
MCV: 84.6 fL (ref 80.0–100.0)
Monocytes Absolute: 0.6 10*3/uL (ref 0.1–1.0)
Monocytes Relative: 5 %
Neutro Abs: 9.1 10*3/uL — ABNORMAL HIGH (ref 1.7–7.7)
Neutrophils Relative %: 90 %
Platelets: 250 10*3/uL (ref 150–400)
RBC: 5.46 MIL/uL (ref 4.22–5.81)
RDW: 13.8 % (ref 11.5–15.5)
WBC: 10.3 10*3/uL (ref 4.0–10.5)
nRBC: 0 % (ref 0.0–0.2)

## 2023-10-29 LAB — COMPREHENSIVE METABOLIC PANEL
ALT: 16 U/L (ref 0–44)
AST: 25 U/L (ref 15–41)
Albumin: 3.7 g/dL (ref 3.5–5.0)
Alkaline Phosphatase: 158 U/L — ABNORMAL HIGH (ref 38–126)
Anion gap: 13 (ref 5–15)
BUN: 14 mg/dL (ref 8–23)
CO2: 22 mmol/L (ref 22–32)
Calcium: 9.5 mg/dL (ref 8.9–10.3)
Chloride: 100 mmol/L (ref 98–111)
Creatinine, Ser: 1.52 mg/dL — ABNORMAL HIGH (ref 0.61–1.24)
GFR, Estimated: 51 mL/min — ABNORMAL LOW (ref >60)
Glucose, Bld: 235 mg/dL — ABNORMAL HIGH (ref 70–99)
Potassium: 5.1 mmol/L (ref 3.5–5.1)
Sodium: 135 mmol/L (ref 135–145)
Total Bilirubin: 0.7 mg/dL (ref <1.2)
Total Protein: 7.1 g/dL (ref 6.5–8.1)

## 2023-10-29 LAB — PHOSPHORUS: Phosphorus: 2.7 mg/dL (ref 2.5–4.6)

## 2023-10-29 LAB — MAGNESIUM: Magnesium: 1.8 mg/dL (ref 1.7–2.4)

## 2023-10-29 MED ORDER — CHLORHEXIDINE GLUCONATE CLOTH 2 % EX PADS
6.0000 | MEDICATED_PAD | Freq: Every day | CUTANEOUS | Status: DC
  Administered 2023-10-29 – 2023-11-02 (×4): 6 via TOPICAL

## 2023-10-29 MED ORDER — FAMOTIDINE IN NACL 20-0.9 MG/50ML-% IV SOLN
20.0000 mg | Freq: Once | INTRAVENOUS | Status: AC
  Administered 2023-10-29: 20 mg via INTRAVENOUS
  Filled 2023-10-29: qty 50

## 2023-10-29 MED ORDER — LACTATED RINGERS IV SOLN: INTRAVENOUS | Status: AC

## 2023-10-29 MED ORDER — SODIUM CHLORIDE 0.9% FLUSH
10.0000 mL | Freq: Two times a day (BID) | INTRAVENOUS | Status: DC
  Administered 2023-10-29 – 2023-10-31 (×4): 10 mL

## 2023-10-29 MED ORDER — SODIUM CHLORIDE 0.9% FLUSH: 10.0000 mL | INTRAVENOUS | Status: DC | PRN

## 2023-10-29 MED ORDER — DIATRIZOATE MEGLUMINE & SODIUM 66-10 % PO SOLN
90.0000 mL | Freq: Once | ORAL | Status: AC
  Administered 2023-10-29: 90 mL via NASOGASTRIC
  Filled 2023-10-29: qty 90

## 2023-10-29 MED ORDER — FAMOTIDINE IN NACL 20-0.9 MG/50ML-% IV SOLN
20.0000 mg | INTRAVENOUS | Status: DC
Start: 2023-10-29 — End: 2023-11-02
  Administered 2023-10-29 – 2023-11-01 (×4): 20 mg via INTRAVENOUS
  Filled 2023-10-29 (×4): qty 50

## 2023-10-29 MED ORDER — LORAZEPAM 2 MG/ML IJ SOLN
1.0000 mg | Freq: Four times a day (QID) | INTRAMUSCULAR | Status: DC | PRN
  Administered 2023-10-29 – 2023-11-01 (×7): 1 mg via INTRAVENOUS
  Filled 2023-10-29 (×7): qty 1

## 2023-10-29 MED ORDER — ALUM & MAG HYDROXIDE-SIMETH 200-200-20 MG/5ML PO SUSP
30.0000 mL | Freq: Four times a day (QID) | ORAL | Status: DC | PRN
  Administered 2023-10-29 – 2023-10-31 (×4): 30 mL via ORAL
  Filled 2023-10-29 (×4): qty 30

## 2023-10-29 MED ORDER — PROCHLORPERAZINE EDISYLATE 10 MG/2ML IJ SOLN
10.0000 mg | Freq: Four times a day (QID) | INTRAMUSCULAR | Status: DC | PRN
  Administered 2023-10-31: 10 mg via INTRAVENOUS
  Filled 2023-10-29: qty 2

## 2023-10-29 MED ORDER — BACLOFEN 10 MG PO TABS
5.0000 mg | ORAL_TABLET | Freq: Three times a day (TID) | ORAL | Status: DC | PRN
  Administered 2023-10-29: 5 mg via ORAL
  Filled 2023-10-29: qty 1

## 2023-10-29 NOTE — Progress Notes (Signed)
Progress Note     Subjective: Pt reports still having significant abdominal pain and nausea this AM. NGT just placed with bilious fluid draining. His goal is be comfortable at home. His wife is at bedside and confirms this plan. He was scheduled to establish with outpatient hospice next Monday but has not seen them yet. Has been struggling with pain control and not wanting to eat much at all.   Objective: Vital signs in last 24 hours: Temp:  [98 F (36.7 C)-98.9 F (37.2 C)] 98 F (36.7 C) (12/12 1003) Pulse Rate:  [66-82] 82 (12/12 1003) Resp:  [13-19] 19 (12/12 1003) BP: (126-153)/(85-99) 140/99 (12/12 1003) SpO2:  [95 %-99 %] 96 % (12/12 1003) Weight:  [90 kg] 90 kg (12/11 1613)    Intake/Output from previous day: 12/11 0701 - 12/12 0700 In: 520 [P.O.:30; I.V.:404.8; IV Piggyback:85.2] Out: 100 [Urine:100] Intake/Output this shift: Total I/O In: 460 [P.O.:60; I.V.:400] Out: 400 [Emesis/NG output:400]  PE: General: pleasant, WD, thin male, NAD Heart: regular, rate, and rhythm.  Lungs: Respiratory effort nonlabored Abd: soft, mild distention, generalized ttp without peritonitis, ostomy viable with some liquid output, NGT with thin bilious drainage Psych: A&Ox3 with an appropriate affect.    Lab Results:  Recent Labs    10/28/23 1644  WBC 8.8  HGB 14.9  HCT 45.9  PLT 226   BMET Recent Labs    10/28/23 1644  NA 133*  K 3.8  CL 98  CO2 24  GLUCOSE 131*  BUN 13  CREATININE 1.34*  CALCIUM 9.2   PT/INR No results for input(s): "LABPROT", "INR" in the last 72 hours. CMP     Component Value Date/Time   NA 133 (L) 10/28/2023 1644   K 3.8 10/28/2023 1644   CL 98 10/28/2023 1644   CO2 24 10/28/2023 1644   GLUCOSE 131 (H) 10/28/2023 1644   BUN 13 10/28/2023 1644   CREATININE 1.34 (H) 10/28/2023 1644   CREATININE 1.67 (H) 10/23/2023 1115   CALCIUM 9.2 10/28/2023 1644   PROT 7.0 10/28/2023 1644   ALBUMIN 3.6 10/28/2023 1644   AST 23 10/28/2023 1644    AST 23 10/23/2023 1115   ALT 17 10/28/2023 1644   ALT 14 10/23/2023 1115   ALKPHOS 151 (H) 10/28/2023 1644   BILITOT 0.9 10/28/2023 1644   BILITOT 0.6 10/23/2023 1115   GFRNONAA 60 (L) 10/28/2023 1644   GFRNONAA 46 (L) 10/23/2023 1115   GFRAA 84 03/17/2008 1007   Lipase     Component Value Date/Time   LIPASE 24 10/28/2023 1644       Studies/Results: CT ABDOMEN PELVIS W CONTRAST Result Date: 10/28/2023 CLINICAL DATA:  Abdominal pain, constipation, history of rectal cancer EXAM: CT ABDOMEN AND PELVIS WITH CONTRAST TECHNIQUE: Multidetector CT imaging of the abdomen and pelvis was performed using the standard protocol following bolus administration of intravenous contrast. RADIATION DOSE REDUCTION: This exam was performed according to the departmental dose-optimization program which includes automated exposure control, adjustment of the mA and/or kV according to patient size and/or use of iterative reconstruction technique. CONTRAST:  OMNIPAQUE IOHEXOL 300 MG/ML  SOLN COMPARISON:  07/02/2023 FINDINGS: Lower chest: Trace left pleural effusion. Minimal left lower lobe atelectasis. Hepatobiliary: Subtle nodular contour of the liver capsule more pronounced on this contrast exam. Numerous subcentimeter hypodensities are seen throughout the liver parenchyma, not grossly changed since the 2023 study and most consistent with hepatic cysts. Given findings elsewhere within the abdomen and pelvis, metastatic disease would be difficult to  exclude. Gallbladder is decompressed, with stable calcified gallstones. No evidence of acute cholecystitis. Pancreas: Unremarkable. No pancreatic ductal dilatation or surrounding inflammatory changes. Spleen: Stable borderline splenomegaly. No focal parenchymal abnormality. Adrenals/Urinary Tract: There are multiple bilateral simple appearing renal cortical cysts. Stable complex hemorrhagic cyst lower pole left kidney image 43/2 measuring 1.6 cm. No specific imaging  follow-up is required. No urinary tract calculi or obstructive uropathy. The adrenals and bladder are unremarkable. Stomach/Bowel: Since the previous staging exam, circumferential wall thickening of the rectum has progressed, measuring up to 15 mm in thickness reference image 80/2. Multiple loops of dilated jejunum are seen within the central abdomen, measuring up to 3.7 cm in diameter, consistent with small-bowel obstruction. Transition point is seen within the distal jejunum within the lower central abdomen, compatible small bowel obstruction likely due to postsurgical adhesions or omental caking. Stable right upper quadrant enterostomy. Vascular/Lymphatic: No significant vascular findings. No discrete adenopathy within the abdomen or pelvis. Reproductive: Prostate is unremarkable. Other: Peritoneal carcinomatosis and omental caking again noted throughout the abdomen and pelvis, with interval development of moderate ascites. No free intraperitoneal gas. No abdominal wall hernia. Musculoskeletal: No acute or destructive bony abnormalities. Reconstructed images demonstrate no additional findings. IMPRESSION: 1. Findings consistent with progression of metastatic rectal cancer. Progressive annular mural thickening of the rectum consistent with worsening primary disease, with interval worsening of omental caking and development of significant malignant ascites. 2. Small bowel obstruction, transition point in the mid to distal jejunum likely as result of omental caking or postsurgical adhesions. 3. Cirrhotic morphology of the liver, with numerous subcentimeter hypodensities throughout the liver parenchyma compatible with cysts. Given findings elsewhere within the abdomen and pelvis, hepatic metastases would be difficult to exclude. 4. Cholelithiasis without evidence of cholecystitis. 5. Small left pleural effusion. Electronically Signed   By: Sharlet Salina M.D.   On: 10/28/2023 19:10     Anti-infectives: Anti-infectives (From admission, onward)    None        Assessment/Plan Metastatic rectal cancer with carcinomatosis  SBO - NGT placed and patient wanting to see if obstruction clears with gastrografin - fine to try this although agree that palliative gastrostomy may ultimately be best for patient given that goal is to be comfortable at home - could consider IR consult and see if they could place since that would be less invasive than surgical gastrostomy  - agree with palliative consult for pain control  - patient and wife have a good understanding that if proceeding with gastrostomy it is purely palliative   FEN: NPO, NGT to LIWS, IVF per primary  VTE: ok to have LMWH or SQH from surgery standpoint ID: none    LOS: 1 day   I reviewed hospitalist notes, last 24 h vitals and pain scores, last 48 h intake and output, last 24 h labs and trends, and last 24 h imaging results.   Juliet Rude, [facility] 10/29/2023, 11:19 AM Please see Amion for pager number during day hours 7:00am-4:30pm

## 2023-10-29 NOTE — Progress Notes (Signed)
PROGRESS NOTE    Kenneth [name]  TKZ:[id number] DOB: [birth year] DOA: 10/28/2023 PCP: [name], MD   Brief Narrative:  HPI per Dr. Buena [name] on 10/28/23 Kenneth [name] is a 62 y.o. male with medical history significant for terminal rectal cancer per wife's report.  He has malignant ascites, omental caking, and symptomatic rectal thickening and a hospice referral was placed last week.  He has rectal discharge of mucous and some blood up 5 times a day. Yesterday he started having increased gas and acid reflux.  It got much worse after eating.  He had no stool in his colostomy for 24 hours.  His wife called oncologist and was advised to try Miralax but if no bm to come to the ED. while he has not had a BM he did have some gas expressed into his colostomy bag after the MiraLAX.  And that did help to relieve the pressure discomfort that he was feeling.  At the time of my evaluation he rated his abdominal pressure discomfort at 2.  He does have a stronger pain in his rectum.  He takes tramadol for that at home which is usually effective.  **Interim History Continues to have some abdominal discomfort and pain and felt nauseous.  Appeared uncomfortable sitting in the chair.  Surgery, medical oncology palliative care has been consulted for further care and if he does not improve will likely require a palliative venting G-tube.  Assessment and Plan:  SBO  -The surgeon has recommended an NG tube but was declined at the time of admission but then decided to have it placed early this morning.  Is now connected to intermittent suction and he has copious bilious fluid noted -He has had a little bit of gas output in his colostomy bag and the abdominal discomfort still persistent so we will continue with antiemetics and pain medicine skin -Surgery is evaluated and requesting and recommend a GJ tube placement with bowel rest to see if this improves his bowel obstruction however this may need to  be a palliative venting gastrostomy tube -Continues to have significant abdominal discomfort and pain -Currently getting Gastrografin -General Surgery is recommending considering IR consult to see if they will place venting palliative gastrostomy tube to see if this will be less invasive than surgical gastrostomy -Continue n.p.o. and continue with fluid hydration with LR at 75 mL/h for 1 day as well as antiemetics with ondansetron 4 mg IV every 6 as needed for nausea vomiting and prochlorperazine 10 mg every 6.  For refractory nausea vomiting -Continue with famotidine 20 mg IV every 24 and pantoprazole 40 mg every 24 -Continue with IV lorazepam for anxiety and baclofen for singultus   Metastatic Rectal Cancer with Peritoneal and Abdominal Carcinomatosis  -Status post chemotherapy with FOLFOX x 7 cycles and cycle 3 was held due to toxicity and he is also status post radiation therapy plus Xeloda in 2023 -Has an ileostomy and has had chronic high output but now does not have urinary output. -Hospice referral had been made prior to this SBO.   -Consulted Oncology Dr [name] and palliative care in the am. -He was getting IV fluids 2-3 times a week as an outpatient at the cancer center.  Will go ahead and order IV fluids for now and changed to IVF with LR at 75 mL/hr -Has decided to forego any further chemotherapy -Palliative consulted for goals of care discussion while he is hospitalized but apparently the patient has a referral to [facility]  palliative care at home in outpatient setting -Medical oncology to discuss hospice care with the patient as well  CKD Stage 3a -BUN/Cr Trend: Recent Labs  Lab 10/23/23 1115 10/28/23 1644 10/29/23 1314  BUN 17 13 14   CREATININE 1.67* 1.34* 1.52*  -C/w IVF with LR now that Fluids with Kcl have stopped -Avoid Nephrotoxic Medications, Contrast Dyes, Hypotension and Dehydration to Ensure Adequate Renal Perfusion and will need to Renally Adjust Meds -Continue  to Monitor and Trend Renal Function carefully and repeat CMP in the AM   Hyponatremia -Na+ Trend: Recent Labs  Lab 10/23/23 1115 10/28/23 1644 10/29/23 1314  NA 138 133* 135  -Continue to Monitor and Trend and repeat CMP in the AM  GERD/GI Prophylaxis -Continue with pantoprazole 40 mg IV every 24 and famotidine 20 g IV every 24  Overweight -Complicates overall prognosis and care -Estimated body mass index is 27.67 kg/m as calculated from the following:   Height as of this encounter: 5\' 11"  (1.803 m).   Weight as of this encounter: 90 kg.  -Weight Loss and Dietary Counseling given   DVT prophylaxis: None    Code Status: Do not attempt resuscitation (DNR) - Comfort care Family Communication: Discussed with family at bedside Disposition Plan:  Level of care: Med-Surg Status is: Inpatient Remains inpatient appropriate because: Needs further clinical improvement and clearance by surgery, medical oncology and palliative care   Consultants:  Palliative Care Medical oncology General surgery Interventional Radiology  Procedures:  As delineated as above  Antimicrobials:  Anti-infectives (From admission, onward)    None       Subjective: Seen and examined at bedside and is comfortable and complaining abdominal pain and feels nauseous.  Had some abdominal tenderness.  No other concerns or complaints this time.  Objective: Vitals:   10/29/23 0505 10/29/23 1003 10/29/23 1351 10/29/23 1727  BP: (!) 134/97 (!) 140/99 (!) 145/100 (!) 135/90  Pulse: 72 82 86 77  Resp: 15 19 18 19   Temp: 98.9 F (37.2 C) 98 F (36.7 C) 98 F (36.7 C) 98 F (36.7 C)  TempSrc: Oral Oral Oral Oral  SpO2: 97% 96% 99% 98%  Weight:      Height:        Intake/Output Summary (Last 24 hours) at 10/29/2023 1857 Last data filed at 10/29/2023 1800 Gross per 24 hour  Intake 1622.33 ml  Output 2250 ml  Net -627.67 ml   Filed Weights   10/28/23 1613  Weight: 90 kg   Examination: Physical  Exam:  Constitutional: Overweight chronically ill-appearing Caucasian male who appears uncomfortable Respiratory: Diminished to auscultation bilaterally, no wheezing, rales, rhonchi or crackles. Normal respiratory effort and patient is not tachypenic. No accessory muscle use.  Unlabored breathing Cardiovascular: RRR, no murmurs / rubs / gallops. S1 and S2 auscultated. No extremity edema.   Abdomen: Soft, tender to palpate.  Distended end ileostomy with very little output in.  Bowel sounds positive.  GU: Deferred. Musculoskeletal: No clubbing / cyanosis of digits/nails. No joint deformity upper and lower extremities. Skin: No rashes, lesions, ulcers on limited skin evaluation. No induration; Warm and dry.  Neurologic: CN 2-12 grossly intact with no focal deficits. Romberg sign and cerebellar reflexes not assessed.  Psychiatric: Normal judgment and insight. Alert and oriented x 3. Normal mood and appropriate affect.   Data Reviewed: I have personally reviewed following labs and imaging studies  CBC: Recent Labs  Lab 10/23/23 1115 10/28/23 1644 10/29/23 1314  WBC 7.3 8.8 10.3  NEUTROABS 5.8  --  9.1*  HGB 14.9 14.9 15.2  HCT 45.8 45.9 46.2  MCV 84.5 85.3 84.6  PLT 219 226 250   Basic Metabolic Panel: Recent Labs  Lab 10/23/23 1115 10/28/23 1644 10/29/23 1314  NA 138 133* 135  K 4.0 3.8 5.1  CL 100 98 100  CO2 30 24 22   GLUCOSE 131* 131* 235*  BUN 17 13 14   CREATININE 1.67* 1.34* 1.52*  CALCIUM 9.3 9.2 9.5  MG 1.7  --  1.8  PHOS  --   --  2.7   GFR: Estimated Creatinine Clearance: 53.7 mL/min (A) (by C-G formula based on SCr of 1.52 mg/dL (H)). Liver Function Tests: Recent Labs  Lab 10/23/23 1115 10/28/23 1644 10/29/23 1314  AST 23 23 25   ALT 14 17 16   ALKPHOS 144* 151* 158*  BILITOT 0.6 0.9 0.7  PROT 7.3 7.0 7.1  ALBUMIN 4.1 3.6 3.7   Recent Labs  Lab 10/28/23 1644  LIPASE 24   No results for input(s): "AMMONIA" in the last 168 hours. Coagulation  Profile: No results for input(s): "INR", "PROTIME" in the last 168 hours. Cardiac Enzymes: No results for input(s): "CKTOTAL", "CKMB", "CKMBINDEX", "TROPONINI" in the last 168 hours. BNP (last 3 results) No results for input(s): "PROBNP" in the last 8760 hours. HbA1C: No results for input(s): "HGBA1C" in the last 72 hours. CBG: No results for input(s): "GLUCAP" in the last 168 hours. Lipid Profile: No results for input(s): "CHOL", "HDL", "LDLCALC", "TRIG", "CHOLHDL", "LDLDIRECT" in the last 72 hours. Thyroid Function Tests: No results for input(s): "TSH", "T4TOTAL", "FREET4", "T3FREE", "THYROIDAB" in the last 72 hours. Anemia Panel: No results for input(s): "VITAMINB12", "FOLATE", "FERRITIN", "TIBC", "IRON", "RETICCTPCT" in the last 72 hours. Sepsis Labs: No results for input(s): "PROCALCITON", "LATICACIDVEN" in the last 168 hours.  No results found for this or any previous visit (from the past 240 hours).   Radiology Studies: DG Abd 1 View Result Date: 10/29/2023 CLINICAL DATA:  NG tube placement EXAM: ABDOMEN - 1 VIEW limited for tube placement upright COMPARISON:  CT 10/28/2023 FINDINGS: Limited under penetrated x-ray demonstrates enteric tube in place with tip overlying the upper stomach but the side hole above the GE junction. Recommend this be advanced further several cm to reach the stomach. Nonspecific bowel gas pattern elsewhere in the midabdomen. Few distended small bowel loops identified as on prior CT scan IMPRESSION: Enteric tube in place. Side hole above the GE junction. Recommend further advancement into the stomach Electronically Signed   By: Karen Kays M.D.   On: 10/29/2023 13:55   CT ABDOMEN PELVIS W CONTRAST Result Date: 10/28/2023 CLINICAL DATA:  Abdominal pain, constipation, history of rectal cancer EXAM: CT ABDOMEN AND PELVIS WITH CONTRAST TECHNIQUE: Multidetector CT imaging of the abdomen and pelvis was performed using the standard protocol following bolus  administration of intravenous contrast. RADIATION DOSE REDUCTION: This exam was performed according to the departmental dose-optimization program which includes automated exposure control, adjustment of the mA and/or kV according to patient size and/or use of iterative reconstruction technique. CONTRAST:  OMNIPAQUE IOHEXOL 300 MG/ML  SOLN COMPARISON:  07/02/2023 FINDINGS: Lower chest: Trace left pleural effusion. Minimal left lower lobe atelectasis. Hepatobiliary: Subtle nodular contour of the liver capsule more pronounced on this contrast exam. Numerous subcentimeter hypodensities are seen throughout the liver parenchyma, not grossly changed since the 2023 study and most consistent with hepatic cysts. Given findings elsewhere within the abdomen and pelvis, metastatic disease would be difficult to exclude. Gallbladder is decompressed, with stable  calcified gallstones. No evidence of acute cholecystitis. Pancreas: Unremarkable. No pancreatic ductal dilatation or surrounding inflammatory changes. Spleen: Stable borderline splenomegaly. No focal parenchymal abnormality. Adrenals/Urinary Tract: There are multiple bilateral simple appearing renal cortical cysts. Stable complex hemorrhagic cyst lower pole left kidney image 43/2 measuring 1.6 cm. No specific imaging follow-up is required. No urinary tract calculi or obstructive uropathy. The adrenals and bladder are unremarkable. Stomach/Bowel: Since the previous staging exam, circumferential wall thickening of the rectum has progressed, measuring up to 15 mm in thickness reference image 80/2. Multiple loops of dilated jejunum are seen within the central abdomen, measuring up to 3.7 cm in diameter, consistent with small-bowel obstruction. Transition point is seen within the distal jejunum within the lower central abdomen, compatible small bowel obstruction likely due to postsurgical adhesions or omental caking. Stable right upper quadrant enterostomy.  Vascular/Lymphatic: No significant vascular findings. No discrete adenopathy within the abdomen or pelvis. Reproductive: Prostate is unremarkable. Other: Peritoneal carcinomatosis and omental caking again noted throughout the abdomen and pelvis, with interval development of moderate ascites. No free intraperitoneal gas. No abdominal wall hernia. Musculoskeletal: No acute or destructive bony abnormalities. Reconstructed images demonstrate no additional findings. IMPRESSION: 1. Findings consistent with progression of metastatic rectal cancer. Progressive annular mural thickening of the rectum consistent with worsening primary disease, with interval worsening of omental caking and development of significant malignant ascites. 2. Small bowel obstruction, transition point in the mid to distal jejunum likely as result of omental caking or postsurgical adhesions. 3. Cirrhotic morphology of the liver, with numerous subcentimeter hypodensities throughout the liver parenchyma compatible with cysts. Given findings elsewhere within the abdomen and pelvis, hepatic metastases would be difficult to exclude. 4. Cholelithiasis without evidence of cholecystitis. 5. Small left pleural effusion. Electronically Signed   By: Sharlet Salina M.D.   On: 10/28/2023 19:10   Scheduled Meds:  Chlorhexidine Gluconate Cloth  6 each Topical Daily   lidocaine  1 Application Other Once   pantoprazole (PROTONIX) IV  40 mg Intravenous Q24H   sodium chloride flush  10-40 mL Intracatheter Q12H   Continuous Infusions:  famotidine (PEPCID) IV 20 mg (10/29/23 1814)   lactated ringers 75 mL/hr at 10/29/23 1742    LOS: 1 day   Marguerita Merles, DO [facility] Available via Epic secure chat 7am-7pm After these hours, please refer to coverage provider listed on [email or web address] 10/29/2023, 6:57 PM

## 2023-10-29 NOTE — Plan of Care (Signed)

## 2023-10-29 NOTE — Hospital Course (Addendum)
HPI per Dr. Buena [name] on 10/28/23 Kenneth [name] is a 62 y.o. male with medical history significant for terminal rectal cancer per wife's report.  He has malignant ascites, omental caking, and symptomatic rectal thickening and a hospice referral was placed last week.  He has rectal discharge of mucous and some blood up 5 times a day. Yesterday he started having increased gas and acid reflux.  It got much worse after eating.  He had no stool in his colostomy for 24 hours.  His wife called oncologist and was advised to try Miralax but if no bm to come to the ED. while he has not had a BM he did have some gas expressed into his colostomy bag after the MiraLAX.  And that did help to relieve the pressure discomfort that he was feeling.  At the time of my evaluation he rated his abdominal pressure discomfort at 2.  He does have a stronger pain in his rectum.  He takes tramadol for that at home which is usually effective.  **Interim History Continues to have some abdominal discomfort and pain and felt nauseous her medications have been adjusted and added. Surgery, medical oncology palliative care has been consulted for further care and now the recommendation is for a palliative venting G-tube however unfortunately cannot be done until Monday, 11/02/2023.  Assessment and Plan:  SBO  -The surgeon has recommended an NG tube but was declined at the time of admission but then decided to have it placed early this morning.  Is now connected to intermittent suction and he has copious bilious fluid noted and had at least 2 L output yesterday and they may consider a clamping trial liquid diet for next few days but is likely the obstruction will likely recur and he will benefit from a venting gastrostomy tube -He has had a little bit of gas output in his colostomy bag and the abdominal discomfort still persistent so we will continue with antiemetics and pain medicine skin -Surgery is evaluated and requesting  and recommend a palliative venting gastrostomy tube placement with bowel rest to see if this improves his bowel obstruction; -Continues to have significant abdominal discomfort and pain -Currently getting Gastrografin -General Surgery is recommending considering IR consult to see if they will place venting palliative gastrostomy tube to see if this will be less invasive than surgical gastrostomy; IR consulted and was concerned for risk of peritonitis with g tube given the ascites but this was discussed this with patient and he would like to proceed with g tube. IR willing but unable to do until Monday 11/02/23 -Continue n.p.o. and continue with fluid hydration with LR at 75 mL/h for 1 day as well as antiemetics with ondansetron 4 mg IV every 6 as needed for nausea vomiting and prochlorperazine 10 mg every 6.  For refractory nausea vomiting -Continue with famotidine 20 mg IV every 24 and pantoprazole 40 mg every 24 -Continue with IV lorazepam for anxiety and baclofen for singultus -KUB done yesterday and showed "Similar degree of small-bowel dilatation within the central abdomen suggestive of ongoing small-bowel obstruction." -General Surgery recommending continuing NG tube and bowel rest   Metastatic Rectal Cancer with Peritoneal and Abdominal Carcinomatosis  -Status post chemotherapy with FOLFOX x 7 cycles and cycle 3 was held due to toxicity and he is also status post radiation therapy plus Xeloda in 2023 -Has an ileostomy and has had chronic high output but now does not have urinary output. -Hospice referral had been made prior  to this SBO.   -Consulted Oncology Dr [name] and palliative care in the am. -He was getting IV fluids 2-3 times a week as an outpatient at the cancer center.  Will go ahead and order IV fluids for now and changed to IVF with LR at 75 mL/hr and will continue for today -Has decided to forego any further chemotherapy -Palliative consulted for goals of care discussion while  he is hospitalized but apparently the patient has a referral to [facility] care at home in outpatient setting -Medical oncology to discuss hospice care with the patient as well and patient is agreeable to hospice services and this is going to be arranged -Medical oncology has added sublingual morphine concentrate 10 to 20 mg every 4 as needed for pain and have increased his hydromorphone dose to 0.5-1 mL grams every 3 hours as needed for moderate pain  CKD Stage 3a -BUN/Cr Trend: Recent Labs  Lab 10/23/23 1115 10/28/23 1644 10/29/23 1314 10/30/23 0348 10/31/23 0318  BUN 17 13 14 17 19   CREATININE 1.67* 1.34* 1.52* 1.65* 1.60*  -Resume IVF with LR at 75 mL/hr x 1 Day  -Avoid Nephrotoxic Medications, Contrast Dyes, Hypotension and Dehydration to Ensure Adequate Renal Perfusion and will need to Renally Adjust Meds -Continue to Monitor and Trend Renal Function carefully and repeat CMP in the AM   Hyponatremia -Na+ Trend: Recent Labs  Lab 10/23/23 1115 10/28/23 1644 10/29/23 1314 10/30/23 0348 10/31/23 0318  NA 138 133* 135 134* 133*  -Continue to Monitor and Trend and repeat CMP in the AM  GERD/GI Prophylaxis -Continue with Pantoprazole 40 mg IV every 24 and Famotidine 20 g IV every 24  Hypoalbuminemia -Patient's Albumin Trend: Recent Labs  Lab 10/23/23 1115 10/28/23 1644 10/29/23 1314 10/30/23 0348 10/31/23 0318  ALBUMIN 4.1 3.6 3.7 3.5 3.4*  -Continue to Monitor and Trend and repeat CMP in the AM  Overweight -Complicates overall prognosis and care -Estimated body mass index is 27.67 kg/m as calculated from the following:   Height as of this encounter: 5\' 11"  (1.803 m).   Weight as of this encounter: 90 kg.  -Weight Loss and Dietary Counseling given  Tobacco Abuse -Smoking cessation counseling given the patient is still smoking

## 2023-10-29 NOTE — Progress Notes (Addendum)
Kenneth [name]   DOB:1961-07-09   WU#:[id number]      ASSESSMENT & PLAN:  Rectal cancer, metastatic Abdominal carcinomatosis -Invasive moderate to poorly differentiated adenocarcinoma -Diagnosed March 2023 -Status post chemotherapy with FOLFOX x 7 cycles.  Cycle 8 held due to toxicity. -Status post radiation therapy plus Xeloda in 2023. -Patient has an ileostomy and has chronic high output. -He has been receiving IV fluids 3 times per week at the cancer center. -Patient has decided against further chemotherapy. -Patient's son state they have referral to [facility] for palliative care at home.  -Goals of care discussion with Palliative team here recommended.   2.  Small bowel obstruction -Likely secondary to malignancy -Surgery eval requested-recommend an GJ tube placement with bowel rest to see if this improves his bowel obstruction.   -NGT placed today +bilious fluid  with copious amounts of drainage noted.  -[facility] On[facility]/Dr. [name] had long discussion with patient and his wife earlier today.  Patient confirms his decision today with Dr. [name] against further chemotherapy.    3.  CKD -Elevated creatinine level -Avoid nephrotoxic meds -Monitor CMP   Code Status DNR-Comfort  Goals of care: Not curable  Discharge planning: Home with palliative care; [facility].   Subjective:  Patient seen awake and alert sitting up in chair at bedside. Ill-appearing. Patient with minimal responses, eyes closed although follows commands.  Appears in mild distress. NGT intact and draining copious amount of bilious fluid.  Reports Rectal pain on and off.  Son at bedside. No acute distress.    Objective:  Vitals:   10/29/23 0505 10/29/23 1003  BP: (!) 134/97 (!) 140/99  Pulse: 72 82  Resp: 15 19  Temp: 98.9 F (37.2 C) 98 F (36.7 C)  SpO2: 97% 96%     Intake/Output Summary (Last 24 hours) at 10/29/2023 1004 Last data filed at 10/29/2023 0600 Gross per 24 hour  Intake 520.02 ml  Output  100 ml  Net 420.02 ml     REVIEW OF SYSTEMS:   Constitutional: +fatigue, Denies fevers, chills or abnormal night sweats Eyes: Denies blurriness of vision, double vision or watery eyes Ears, nose, mouth, throat, and face: Denies mucositis or sore throat Respiratory: Denies cough, dyspnea or wheezes Cardiovascular: Denies palpitation, chest discomfort or lower extremity swelling Gastrointestinal: +abd discomfort, Denies nausea, heartburn or change in bowel habits Skin: Denies abnormal skin rashes Lymphatics: Denies new lymphadenopathy or easy bruising Neurological: Denies numbness, tingling or new weaknesses Behavioral/Psych: Mood is stable, no new changes  All other systems were reviewed with the patient and are negative.  PHYSICAL EXAMINATION: ECOG PERFORMANCE STATUS: 1 - Symptomatic but completely ambulatory  Vitals:   10/29/23 0505 10/29/23 1003  BP: (!) 134/97 (!) 140/99  Pulse: 72 82  Resp: 15 19  Temp: 98.9 F (37.2 C) 98 F (36.7 C)  SpO2: 97% 96%   Filed Weights   10/28/23 1613  Weight: 198 lb 6.6 oz (90 kg)    GENERAL: alert, +mild distress SKIN: skin color, texture, turgor are normal, no rashes or significant lesions EYES: normal, conjunctiva are pink and non-injected, sclera clear OROPHARYNX: no exudate, no erythema and lips, buccal mucosa, and tongue normal +NGT to drainage NECK: supple, thyroid normal size, non-tender, without nodularity LYMPH: no palpable lymphadenopathy in the cervical, axillary or inguinal LUNGS: clear to auscultation and percussion with normal breathing effort HEART: regular rate & rhythm and no murmurs and no lower extremity edema ABDOMEN: +colostomy  MUSCULOSKELETAL: no cyanosis of digits and no clubbing  PSYCH: alert & oriented x 3 with fluent speech NEURO: no focal motor/sensory deficits   All questions were answered. The patient knows to call the clinic with any problems, questions or concerns.   The total time spent in the  appointment was 40 minutes encounter with patient including review of chart and various tests results, discussions about plan of care and coordination of care plan  Dawson Bills, NP 10/29/2023 10:04 AM    Labs Reviewed:  Lab Results  Component Value Date   WBC 8.8 10/28/2023   HGB 14.9 10/28/2023   HCT 45.9 10/28/2023   MCV 85.3 10/28/2023   PLT 226 10/28/2023   Recent Labs    09/18/23 0800 09/25/23 1156 10/23/23 1115 10/28/23 1644  NA 137 137 138 133*  K 3.7 3.9 4.0 3.8  CL 101 98 100 98  CO2 31 31 30 24   GLUCOSE 106* 119* 131* 131*  BUN 16 16 17 13   CREATININE 1.75* 1.77* 1.67* 1.34*  CALCIUM 9.7 9.7 9.3 9.2  GFRNONAA 43* 43* 46* 60*  PROT 6.6  --  7.3 7.0  ALBUMIN 4.1  --  4.1 3.6  AST 26  --  23 23  ALT 15  --  14 17  ALKPHOS 122  --  144* 151*  BILITOT 0.6  --  0.6 0.9    Studies Reviewed:  CT ABDOMEN PELVIS W CONTRAST Result Date: 10/28/2023 CLINICAL DATA:  Abdominal pain, constipation, history of rectal cancer EXAM: CT ABDOMEN AND PELVIS WITH CONTRAST TECHNIQUE: Multidetector CT imaging of the abdomen and pelvis was performed using the standard protocol following bolus administration of intravenous contrast. RADIATION DOSE REDUCTION: This exam was performed according to the departmental dose-optimization program which includes automated exposure control, adjustment of the mA and/or kV according to patient size and/or use of iterative reconstruction technique. CONTRAST:  OMNIPAQUE IOHEXOL 300 MG/ML  SOLN COMPARISON:  07/02/2023 FINDINGS: Lower chest: Trace left pleural effusion. Minimal left lower lobe atelectasis. Hepatobiliary: Subtle nodular contour of the liver capsule more pronounced on this contrast exam. Numerous subcentimeter hypodensities are seen throughout the liver parenchyma, not grossly changed since the 2023 study and most consistent with hepatic cysts. Given findings elsewhere within the abdomen and pelvis, metastatic disease would be difficult to  exclude. Gallbladder is decompressed, with stable calcified gallstones. No evidence of acute cholecystitis. Pancreas: Unremarkable. No pancreatic ductal dilatation or surrounding inflammatory changes. Spleen: Stable borderline splenomegaly. No focal parenchymal abnormality. Adrenals/Urinary Tract: There are multiple bilateral simple appearing renal cortical cysts. Stable complex hemorrhagic cyst lower pole left kidney image 43/2 measuring 1.6 cm. No specific imaging follow-up is required. No urinary tract calculi or obstructive uropathy. The adrenals and bladder are unremarkable. Stomach/Bowel: Since the previous staging exam, circumferential wall thickening of the rectum has progressed, measuring up to 15 mm in thickness reference image 80/2. Multiple loops of dilated jejunum are seen within the central abdomen, measuring up to 3.7 cm in diameter, consistent with small-bowel obstruction. Transition point is seen within the distal jejunum within the lower central abdomen, compatible small bowel obstruction likely due to postsurgical adhesions or omental caking. Stable right upper quadrant enterostomy. Vascular/Lymphatic: No significant vascular findings. No discrete adenopathy within the abdomen or pelvis. Reproductive: Prostate is unremarkable. Other: Peritoneal carcinomatosis and omental caking again noted throughout the abdomen and pelvis, with interval development of moderate ascites. No free intraperitoneal gas. No abdominal wall hernia. Musculoskeletal: No acute or destructive bony abnormalities. Reconstructed images demonstrate no additional findings. IMPRESSION: 1. Findings  consistent with progression of metastatic rectal cancer. Progressive annular mural thickening of the rectum consistent with worsening primary disease, with interval worsening of omental caking and development of significant malignant ascites. 2. Small bowel obstruction, transition point in the mid to distal jejunum likely as result of  omental caking or postsurgical adhesions. 3. Cirrhotic morphology of the liver, with numerous subcentimeter hypodensities throughout the liver parenchyma compatible with cysts. Given findings elsewhere within the abdomen and pelvis, hepatic metastases would be difficult to exclude. 4. Cholelithiasis without evidence of cholecystitis. 5. Small left pleural effusion. Electronically Signed   By: Sharlet Salina M.D.   On: 10/28/2023 19:10    Kenneth [name] was interviewed and examined.  I reviewed the admission CT images.  His wife was at the bedside when I saw him earlier this morning.  He is admitted with a small bowel obstruction, likely secondary to carcinomatosis.  He has experienced increased abdominal pain over recent weeks.  He has rectal bleeding secondary to the in situ rectal tumor.  Kenneth [name] reconfirmed his decision to not receive further systemic therapy.  He has been seen by the surgical resident and NG tube is in place.  Hopefully the bowel obstruction will improve with several days of bowel rest.  If not, I recommend a palliative venting gastrostomy.   We discussed hospice care.  He understands he will not be able to receive home IV fluids if he is enrolled in hospice.

## 2023-10-30 ENCOUNTER — Ambulatory Visit: Payer: BC Managed Care – PPO

## 2023-10-30 ENCOUNTER — Inpatient Hospital Stay (HOSPITAL_COMMUNITY): Payer: BC Managed Care – PPO

## 2023-10-30 DIAGNOSIS — G620 Drug-induced polyneuropathy: Secondary | ICD-10-CM | POA: Diagnosis not present

## 2023-10-30 DIAGNOSIS — Z66 Do not resuscitate: Secondary | ICD-10-CM

## 2023-10-30 DIAGNOSIS — K56609 Unspecified intestinal obstruction, unspecified as to partial versus complete obstruction: Secondary | ICD-10-CM | POA: Diagnosis not present

## 2023-10-30 DIAGNOSIS — Z515 Encounter for palliative care: Secondary | ICD-10-CM | POA: Diagnosis not present

## 2023-10-30 DIAGNOSIS — C20 Malignant neoplasm of rectum: Secondary | ICD-10-CM | POA: Diagnosis not present

## 2023-10-30 DIAGNOSIS — Z7189 Other specified counseling: Secondary | ICD-10-CM | POA: Diagnosis not present

## 2023-10-30 DIAGNOSIS — C786 Secondary malignant neoplasm of retroperitoneum and peritoneum: Secondary | ICD-10-CM | POA: Diagnosis not present

## 2023-10-30 LAB — CBC WITH DIFFERENTIAL/PLATELET
Abs Immature Granulocytes: 0.02 10*3/uL (ref 0.00–0.07)
Basophils Absolute: 0 10*3/uL (ref 0.0–0.1)
Basophils Relative: 0 %
Eosinophils Absolute: 0.1 10*3/uL (ref 0.0–0.5)
Eosinophils Relative: 1 %
HCT: 44.1 % (ref 39.0–52.0)
Hemoglobin: 14 g/dL (ref 13.0–17.0)
Immature Granulocytes: 0 %
Lymphocytes Relative: 11 %
Lymphs Abs: 0.8 10*3/uL (ref 0.7–4.0)
MCH: 27.1 pg (ref 26.0–34.0)
MCHC: 31.7 g/dL (ref 30.0–36.0)
MCV: 85.3 fL (ref 80.0–100.0)
Monocytes Absolute: 0.7 10*3/uL (ref 0.1–1.0)
Monocytes Relative: 9 %
Neutro Abs: 5.7 10*3/uL (ref 1.7–7.7)
Neutrophils Relative %: 79 %
Platelets: 216 10*3/uL (ref 150–400)
RBC: 5.17 MIL/uL (ref 4.22–5.81)
RDW: 13.6 % (ref 11.5–15.5)
WBC: 7.3 10*3/uL (ref 4.0–10.5)
nRBC: 0 % (ref 0.0–0.2)

## 2023-10-30 LAB — COMPREHENSIVE METABOLIC PANEL
ALT: 17 U/L (ref 0–44)
AST: 24 U/L (ref 15–41)
Albumin: 3.5 g/dL (ref 3.5–5.0)
Alkaline Phosphatase: 136 U/L — ABNORMAL HIGH (ref 38–126)
Anion gap: 8 (ref 5–15)
BUN: 17 mg/dL (ref 8–23)
CO2: 28 mmol/L (ref 22–32)
Calcium: 9.4 mg/dL (ref 8.9–10.3)
Chloride: 98 mmol/L (ref 98–111)
Creatinine, Ser: 1.65 mg/dL — ABNORMAL HIGH (ref 0.61–1.24)
GFR, Estimated: 47 mL/min — ABNORMAL LOW (ref >60)
Glucose, Bld: 93 mg/dL (ref 70–99)
Potassium: 3.9 mmol/L (ref 3.5–5.1)
Sodium: 134 mmol/L — ABNORMAL LOW (ref 135–145)
Total Bilirubin: 1.1 mg/dL (ref <1.2)
Total Protein: 6.7 g/dL (ref 6.5–8.1)

## 2023-10-30 LAB — MAGNESIUM: Magnesium: 1.8 mg/dL (ref 1.7–2.4)

## 2023-10-30 LAB — PHOSPHORUS: Phosphorus: 3.3 mg/dL (ref 2.5–4.6)

## 2023-10-30 MED ORDER — MAGNESIUM SULFATE 2 GM/50ML IV SOLN
2.0000 g | Freq: Once | INTRAVENOUS | Status: AC
  Administered 2023-10-30: 2 g via INTRAVENOUS
  Filled 2023-10-30: qty 50

## 2023-10-30 MED ORDER — MORPHINE SULFATE (CONCENTRATE) 10 MG /0.5 ML PO SOLN
10.0000 mg | ORAL | Status: DC | PRN
  Administered 2023-10-30 – 2023-11-01 (×5): 20 mg via SUBLINGUAL
  Administered 2023-11-01: 10 mg via SUBLINGUAL
  Administered 2023-11-02 (×2): 20 mg via SUBLINGUAL
  Filled 2023-10-30: qty 1
  Filled 2023-10-30: qty 0.5
  Filled 2023-10-30 (×6): qty 1

## 2023-10-30 MED ORDER — FENTANYL CITRATE PF 50 MCG/ML IJ SOSY
25.0000 ug | PREFILLED_SYRINGE | Freq: Once | INTRAMUSCULAR | Status: AC
  Administered 2023-10-30: 25 ug via INTRAVENOUS
  Filled 2023-10-30: qty 1

## 2023-10-30 MED ORDER — PHENOL 1.4 % MT LIQD
1.0000 | OROMUCOSAL | Status: DC | PRN
  Filled 2023-10-30: qty 177

## 2023-10-30 MED ORDER — HYDROMORPHONE HCL 1 MG/ML IJ SOLN
0.5000 mg | INTRAMUSCULAR | Status: DC | PRN
  Administered 2023-10-30 – 2023-11-02 (×11): 1 mg via INTRAVENOUS
  Filled 2023-10-30 (×11): qty 1

## 2023-10-30 NOTE — Plan of Care (Signed)

## 2023-10-30 NOTE — Consult Note (Signed)
Palliative Care Consult Note                                  Date: 10/30/2023   Patient Name: Kenneth [name]  DOB: February 20, 1961  MRN: [id number]  Age / Sex: 62 y.o., male  PCP: [name], MD Referring Physician: Merlene Laughter, DO  Reason for Consultation: {Reason for Consult:23484}  HPI/Patient Profile: 62 y.o. male  with past medical history of *** admitted on 10/28/2023 with ***.   Past Medical History:  Diagnosis Date   Rectal cancer (HCC) 01/09/2022    Subjective:   This NP Wynne Dust reviewed medical records, received report from team, assessed the patient and then meet at the patient's bedside to discuss diagnosis, prognosis, GOC, EOL wishes disposition and options.  I met with ***.   We meet to discuss diagnosis prognosis, GOC, EOL wishes, disposition and options. Concept of Palliative Care was introduced as specialized medical care for people and their families living with serious illness.  If focuses on providing relief from the symptoms and stress of a serious illness.  The goal is to improve quality of life for both the patient and the family. Values and goals of care important to patient and family were attempted to be elicited.  ***  Created space and opportunity for patient  and family to explore thoughts and feelings regarding current medical situation   Natural trajectory and current clinical status were discussed. Questions and concerns addressed. Patient  encouraged to call with questions or concerns.    Patient/Family Understanding of Illness: ***  Life Review: ***  Patient Values: ***  Goals: ***  Today's Discussion: ***  Review of Systems  Objective:   Primary Diagnoses: Present on Admission:  Rectal cancer (HCC)  Peritoneal carcinomatosis (HCC)  Drug-induced polyneuropathy (HCC)  Stage 3a chronic kidney disease (HCC)  SBO (small bowel obstruction) (HCC)   Physical  Exam  Vital Signs:  BP (!) 138/93 (BP Location: Right Arm)   Pulse 85   Temp 97.6 F (36.4 C) (Oral)   Resp 19   Ht 5\' 11"  (1.803 m)   Wt 90 kg   SpO2 99%   BMI 27.67 kg/m   Palliative Assessment/Data: ***    Advanced Care Planning:   Existing Vynca/ACP Documentation: ***  Primary Decision Maker: {Primary Decision JOACZ:66063}  Code Status/Advance Care Planning: {Palliative Code status:23503}  A discussion was had today regarding advanced directives. Concepts specific to code status, artifical feeding and hydration, continued IV antibiotics and rehospitalization was had.  The difference between a aggressive medical intervention path and a palliative comfort care path for this patient at this time was had. ***The MOST form was introduced and discussed.***  Decisions/Changes to ACP: ***  Assessment & Plan:   Impression: ***  SUMMARY OF RECOMMENDATIONS   ***  Symptom Management:  ***  Prognosis:  {Palliative Care Prognosis:23504}  Discharge Planning:  {Palliative dispostion:23505}   Discussed with: ***    Thank you for allowing [place] to participate in the care of Kenneth [name] PMT will continue to support holistically.  Time Total: ***  Detailed review of medical records (labs, imaging, vital signs), medically appropriate exam, discussed with treatment team, counseling and education to patient, family, & staff, documenting clinical information, medication management, coordination of care  Signed by: Wynne Dust, NP Palliative Medicine Team  Team Phone # [phone] (Nights/Weekends)  10/30/2023, 3:02 PM

## 2023-10-30 NOTE — Progress Notes (Signed)
PROGRESS NOTE    Kenneth [name]  UJW:[id number] DOB: 18-Dec-1960 DOA: 10/28/2023 PCP: [name], MD   Brief Narrative:  HPI per Kenneth. Buena [name] on 10/28/23 Kenneth [name] is a 62 y.o. male with medical history significant for terminal rectal cancer per wife's report.  He has malignant ascites, omental caking, and symptomatic rectal thickening and a hospice referral was placed last week.  He has rectal discharge of mucous and some blood up 5 times a day. Yesterday he started having increased gas and acid reflux.  It got much worse after eating.  He had no stool in his colostomy for 24 hours.  His wife called oncologist and was advised to try Miralax but if no bm to come to the ED. while he has not had a BM he did have some gas expressed into his colostomy bag after the MiraLAX.  And that did help to relieve the pressure discomfort that he was feeling.  At the time of my evaluation he rated his abdominal pressure discomfort at 2.  He does have a stronger pain in his rectum.  He takes tramadol for that at home which is usually effective.  **Interim History Continues to have some abdominal discomfort and pain and felt nauseous her medications have been adjusted and added. Surgery, medical oncology palliative care has been consulted for further care and now the recommendation is for a palliative venting G-tube however unfortunately cannot be done until Monday, 11/02/2023.  Assessment and Plan:  SBO  -The surgeon has recommended an NG tube but was declined at the time of admission but then decided to have it placed early this morning.  Is now connected to intermittent suction and he has copious bilious fluid noted and had at least 2 L output yesterday and they may consider a clamping trial liquid diet for next few days but is likely the obstruction will likely recur and he will benefit from a venting gastrostomy tube -He has had a little bit of gas output in his colostomy bag and the  abdominal discomfort still persistent so we will continue with antiemetics and pain medicine skin -Surgery is evaluated and requesting and recommend a palliative venting gastrostomy tube placement with bowel rest to see if this improves his bowel obstruction; -Continues to have significant abdominal discomfort and pain -Currently getting Gastrografin -General Surgery is recommending considering IR consult to see if they will place venting palliative gastrostomy tube to see if this will be less invasive than surgical gastrostomy; IR consulted and was concerned for risk of peritonitis with g tube given the ascites but this was discussed this with patient and he would like to proceed with g tube. IR willing but unable to do until Monday 11/02/23 -Continue n.p.o. and continue with fluid hydration with LR at 75 mL/h for 1 day as well as antiemetics with ondansetron 4 mg IV every 6 as needed for nausea vomiting and prochlorperazine 10 mg every 6.  For refractory nausea vomiting -Continue with famotidine 20 mg IV every 24 and pantoprazole 40 mg every 24 -Continue with IV lorazepam for anxiety and baclofen for singultus -KUB done today and showed "Similar degree of small-bowel dilatation within the central abdomen suggestive of ongoing small-bowel obstruction."   Metastatic Rectal Cancer with Peritoneal and Abdominal Carcinomatosis  -Status post chemotherapy with FOLFOX x 7 cycles and cycle 3 was held due to toxicity and he is also status post radiation therapy plus Xeloda in 2023 -Has an ileostomy and has had chronic  high output but now does not have urinary output. -Hospice referral had been made prior to this SBO.   -Consulted Oncology Kenneth [name] and palliative care in the am. -He was getting IV fluids 2-3 times a week as an outpatient at the cancer center.  Will go ahead and order IV fluids for now and changed to IVF with LR at 75 mL/hr and will continue for today -Has decided to forego any further  chemotherapy -Palliative consulted for goals of care discussion while he is hospitalized but apparently the patient has a referral to [facility] at home in outpatient setting -Medical oncology to discuss hospice care with the patient as well and patient is agreeable to hospice services and this is going to be arranged -Medical oncology has added sublingual morphine concentrate 10 to 20 mg every 4 as needed for pain and have increased his hydromorphone dose to 0.5-1 mL grams every 3 hours as needed for moderate pain  CKD Stage 3a -BUN/Cr Trend: Recent Labs  Lab 10/23/23 1115 10/28/23 1644 10/29/23 1314 10/30/23 0348  BUN 17 13 14 17   CREATININE 1.67* 1.34* 1.52* 1.65*  -Continuing IVF with LR at 75 mL/hr x 1 Day and may renew -Avoid Nephrotoxic Medications, Contrast Dyes, Hypotension and Dehydration to Ensure Adequate Renal Perfusion and will need to Renally Adjust Meds -Continue to Monitor and Trend Renal Function carefully and repeat CMP in the AM   Hyponatremia -Na+ Trend: Recent Labs  Lab 10/23/23 1115 10/28/23 1644 10/29/23 1314 10/30/23 0348  NA 138 133* 135 134*  -Continue to Monitor and Trend and repeat CMP in the AM  GERD/GI Prophylaxis -Continue with pantoprazole 40 mg IV every 24 and famotidine 20 g IV every 24  Overweight -Complicates overall prognosis and care -Estimated body mass index is 27.67 kg/m as calculated from the following:   Height as of this encounter: 5\' 11"  (1.803 m).   Weight as of this encounter: 90 kg.  -Weight Loss and Dietary Counseling given   DVT prophylaxis: SCDs    Code Status: Do not attempt resuscitation (DNR) - Comfort care Family Communication: Discussed with wife at bedside  Disposition Plan:  Level of care: Med-Surg Status is: Inpatient Remains inpatient appropriate because: Needs further clinical improvement and palliative G-tube placement which cannot be done until Monday   Consultants:  Palliative  Care Medical Oncology General Surgery Interventional Radiology  Procedures:  As delineated as above  Antimicrobials:  Anti-infectives (From admission, onward)    None       Subjective: Seen and examined at bedside and he was little bit more comfortable today compared to yesterday.  However he had breakthrough pain eventually and became more comfortable.  Continues to still not had very much output in his ostomy and abdomen is still tender.  He is elected to opt for the palliative venting G-tube however this cannot be done until Monday.  Objective: Vitals:   10/29/23 2209 10/30/23 0201 10/30/23 0459 10/30/23 1342  BP: (!) 143/101 (!) 143/102 (!) 140/95 (!) 138/93  Pulse: 85 82 76 85  Resp: 15 15 16 19   Temp: 97.8 F (36.6 C) 98.5 F (36.9 C) 98.4 F (36.9 C) 97.6 F (36.4 C)  TempSrc: Oral Oral Oral Oral  SpO2: 100% 98% 98% 99%  Weight:      Height:        Intake/Output Summary (Last 24 hours) at 10/30/2023 1543 Last data filed at 10/30/2023 1400 Gross per 24 hour  Intake 1750.63 ml  Output  2750 ml  Net -999.37 ml   Filed Weights   10/28/23 1613  Weight: 90 kg   Examination: Physical Exam:  Constitutional: Overweight chronically ill-appearing Caucasian male who appears a bit more comfortable compared to yesterday Respiratory: Diminished to auscultation bilaterally, no wheezing, rales, rhonchi or crackles. Normal respiratory effort and patient is not tachypenic. No accessory muscle use.  Unlabored breathing Cardiovascular: RRR, no murmurs / rubs / gallops. S1 and S2 auscultated. No extremity edema.  Abdomen: Soft, tender to palpate and distended with ileostomy with very little output in.. Bowel sounds are diminished but slightly positive.  GU: Deferred. Musculoskeletal: No clubbing / cyanosis of digits/nails. No joint deformity upper and lower extremities Skin: No rashes, lesions, ulcers on limited skin evaluation. No induration; Warm and dry.  Neurologic: CN  2-12 grossly intact with no focal deficits. Romberg sign and cerebellar reflexes not assessed.  Psychiatric: Normal judgment and insight. Alert and oriented x 3. Normal mood and appropriate affect.   Data Reviewed: I have personally reviewed following labs and imaging studies  CBC: Recent Labs  Lab 10/28/23 1644 10/29/23 1314 10/30/23 0348  WBC 8.8 10.3 7.3  NEUTROABS  --  9.1* 5.7  HGB 14.9 15.2 14.0  HCT 45.9 46.2 44.1  MCV 85.3 84.6 85.3  PLT 226 250 216   Basic Metabolic Panel: Recent Labs  Lab 10/28/23 1644 10/29/23 1314 10/30/23 0348  NA 133* 135 134*  K 3.8 5.1 3.9  CL 98 100 98  CO2 24 22 28   GLUCOSE 131* 235* 93  BUN 13 14 17   CREATININE 1.34* 1.52* 1.65*  CALCIUM 9.2 9.5 9.4  MG  --  1.8 1.8  PHOS  --  2.7 3.3   GFR: Estimated Creatinine Clearance: 49.4 mL/min (A) (by C-G formula based on SCr of 1.65 mg/dL (H)). Liver Function Tests: Recent Labs  Lab 10/28/23 1644 10/29/23 1314 10/30/23 0348  AST 23 25 24   ALT 17 16 17   ALKPHOS 151* 158* 136*  BILITOT 0.9 0.7 1.1  PROT 7.0 7.1 6.7  ALBUMIN 3.6 3.7 3.5   Recent Labs  Lab 10/28/23 1644  LIPASE 24   No results for input(s): "AMMONIA" in the last 168 hours. Coagulation Profile: No results for input(s): "INR", "PROTIME" in the last 168 hours. Cardiac Enzymes: No results for input(s): "CKTOTAL", "CKMB", "CKMBINDEX", "TROPONINI" in the last 168 hours. BNP (last 3 results) No results for input(s): "PROBNP" in the last 8760 hours. HbA1C: No results for input(s): "HGBA1C" in the last 72 hours. CBG: No results for input(s): "GLUCAP" in the last 168 hours. Lipid Profile: No results for input(s): "CHOL", "HDL", "LDLCALC", "TRIG", "CHOLHDL", "LDLDIRECT" in the last 72 hours. Thyroid Function Tests: No results for input(s): "TSH", "T4TOTAL", "FREET4", "T3FREE", "THYROIDAB" in the last 72 hours. Anemia Panel: No results for input(s): "VITAMINB12", "FOLATE", "FERRITIN", "TIBC", "IRON", "RETICCTPCT" in  the last 72 hours. Sepsis Labs: No results for input(s): "PROCALCITON", "LATICACIDVEN" in the last 168 hours.  No results found for this or any previous visit (from the past 240 hours).   Radiology Studies: DG Abd Portable 1V Result Date: 10/30/2023 CLINICAL DATA:  [id number] SBO (small bowel obstruction) (HCC) [id number] EXAM: PORTABLE ABDOMEN - 1 VIEW COMPARISON:  10/29/2023 FINDINGS: Enteric tube projects within the proximal stomach. Similar degree of small-bowel dilatation within the central abdomen. No enteric contrast is evident, which may be dilute. No gross free intraperitoneal air. IMPRESSION: Similar degree of small-bowel dilatation within the central abdomen suggestive of ongoing small-bowel obstruction. Electronically Signed  By: Duanne Guess D.O.   On: 10/30/2023 13:55   DG Abd Portable 1V-Small Bowel Obstruction Protocol-initial, 8 hr delay Result Date: 10/29/2023 CLINICAL DATA:  Small-bowel obstruction EXAM: PORTABLE ABDOMEN - 1 VIEW COMPARISON:  10/29/2023, 10/28/2023 FINDINGS: Single frontal view of the abdomen and pelvis was obtained, excluding the lower pelvis by collimation. Enteric catheter tip and side port project over the gastric fundus. Stable mild gaseous distention of the small bowel consistent with obstruction. Imaging was performed 8 hours after oral contrast administration. Contrast is not identified on this exam, either due to dilution or interval passage. IMPRESSION: 1. Stable findings of small-bowel obstruction. 2. Oral contrast is not easily visualized on this exam, likely due to dilution within fluid-filled loops of bowel. Electronically Signed   By: Sharlet Salina M.D.   On: 10/29/2023 20:35   DG Abd 1 View Result Date: 10/29/2023 CLINICAL DATA:  NG tube placement EXAM: ABDOMEN - 1 VIEW limited for tube placement upright COMPARISON:  CT 10/28/2023 FINDINGS: Limited under penetrated x-ray demonstrates enteric tube in place with tip overlying the upper stomach but the  side hole above the GE junction. Recommend this be advanced further several cm to reach the stomach. Nonspecific bowel gas pattern elsewhere in the midabdomen. Few distended small bowel loops identified as on prior CT scan IMPRESSION: Enteric tube in place. Side hole above the GE junction. Recommend further advancement into the stomach Electronically Signed   By: Karen Kays M.D.   On: 10/29/2023 13:55   CT ABDOMEN PELVIS W CONTRAST Result Date: 10/28/2023 CLINICAL DATA:  Abdominal pain, constipation, history of rectal cancer EXAM: CT ABDOMEN AND PELVIS WITH CONTRAST TECHNIQUE: Multidetector CT imaging of the abdomen and pelvis was performed using the standard protocol following bolus administration of intravenous contrast. RADIATION DOSE REDUCTION: This exam was performed according to the departmental dose-optimization program which includes automated exposure control, adjustment of the mA and/or kV according to patient size and/or use of iterative reconstruction technique. CONTRAST:  OMNIPAQUE IOHEXOL 300 MG/ML  SOLN COMPARISON:  07/02/2023 FINDINGS: Lower chest: Trace left pleural effusion. Minimal left lower lobe atelectasis. Hepatobiliary: Subtle nodular contour of the liver capsule more pronounced on this contrast exam. Numerous subcentimeter hypodensities are seen throughout the liver parenchyma, not grossly changed since the 2023 study and most consistent with hepatic cysts. Given findings elsewhere within the abdomen and pelvis, metastatic disease would be difficult to exclude. Gallbladder is decompressed, with stable calcified gallstones. No evidence of acute cholecystitis. Pancreas: Unremarkable. No pancreatic ductal dilatation or surrounding inflammatory changes. Spleen: Stable borderline splenomegaly. No focal parenchymal abnormality. Adrenals/Urinary Tract: There are multiple bilateral simple appearing renal cortical cysts. Stable complex hemorrhagic cyst lower pole left kidney image 43/2  measuring 1.6 cm. No specific imaging follow-up is required. No urinary tract calculi or obstructive uropathy. The adrenals and bladder are unremarkable. Stomach/Bowel: Since the previous staging exam, circumferential wall thickening of the rectum has progressed, measuring up to 15 mm in thickness reference image 80/2. Multiple loops of dilated jejunum are seen within the central abdomen, measuring up to 3.7 cm in diameter, consistent with small-bowel obstruction. Transition point is seen within the distal jejunum within the lower central abdomen, compatible small bowel obstruction likely due to postsurgical adhesions or omental caking. Stable right upper quadrant enterostomy. Vascular/Lymphatic: No significant vascular findings. No discrete adenopathy within the abdomen or pelvis. Reproductive: Prostate is unremarkable. Other: Peritoneal carcinomatosis and omental caking again noted throughout the abdomen and pelvis, with interval development of moderate ascites.  No free intraperitoneal gas. No abdominal wall hernia. Musculoskeletal: No acute or destructive bony abnormalities. Reconstructed images demonstrate no additional findings. IMPRESSION: 1. Findings consistent with progression of metastatic rectal cancer. Progressive annular mural thickening of the rectum consistent with worsening primary disease, with interval worsening of omental caking and development of significant malignant ascites. 2. Small bowel obstruction, transition point in the mid to distal jejunum likely as result of omental caking or postsurgical adhesions. 3. Cirrhotic morphology of the liver, with numerous subcentimeter hypodensities throughout the liver parenchyma compatible with cysts. Given findings elsewhere within the abdomen and pelvis, hepatic metastases would be difficult to exclude. 4. Cholelithiasis without evidence of cholecystitis. 5. Small left pleural effusion. Electronically Signed   By: Sharlet Salina M.D.   On: 10/28/2023  19:10   Scheduled Meds:  Chlorhexidine Gluconate Cloth  6 each Topical Daily   lidocaine  1 Application Other Once   pantoprazole (PROTONIX) IV  40 mg Intravenous Q24H   sodium chloride flush  10-40 mL Intracatheter Q12H   Continuous Infusions:  famotidine (PEPCID) IV 20 mg (10/29/23 1814)   lactated ringers 75 mL/hr at 10/29/23 1742    LOS: 2 days   Marguerita Merles, DO [facility] Available via Epic secure chat 7am-7pm After these hours, please refer to coverage provider listed on [email or web address] 10/30/2023, 3:43 PM

## 2023-10-30 NOTE — Progress Notes (Addendum)
Kenneth [name]   DOB:1960-12-19   ZD#:[id number]      ASSESSMENT & PLAN:  Metastatic rectal cancer with abdominal carcinomatosis -Invasive moderate to poorly differentiated adenocarcinoma -Diagnosed March 2023 -S/p chemotherapy with FOLFOX x 7 cycles.  Cycle 8 held due to toxicity. -S/p RT+ Xeloda in 2023. -Patient has an ileostomy and has chronic high output. -He has been receiving IV fluids 3 times x/week at the cancer center. -Patient has decided against further chemotherapy. -Dr. Barney Russomanno/medical oncologist follows closely  2.  Small bowel obstruction -Currently with NG tube draining copious amounts of bilious fluid -Management of bowel obstruction per Surgical Service.   -May consider a clamping trial and liquid diet over the next 1 - 2 days.  Patient may benefit from a venting gastrostomy tube as the obstruction will likely recur. -Recommend sublingual morphine 10 - 20 mg q4 hours as needed for pain -Monitor closely  3.  CKD -Elevated creatinine level -Avoid nephrotoxic meds -Monitor CMP   Code Status DNR-comfort  Goals of care: Not curable  Discharge planning: Home with hospice  Subjective:  Patient seen resting comfortably in bed, wife who is at bedside reports Ativan was just given.  NGT is intact and draining.  Patient is now DNR comfort care.  Objective:  Vitals:   10/30/23 0201 10/30/23 0459  BP: (!) 143/102 (!) 140/95  Pulse: 82 76  Resp: 15 16  Temp: 98.5 F (36.9 C) 98.4 F (36.9 C)  SpO2: 98% 98%     Intake/Output Summary (Last 24 hours) at 10/30/2023 0930 Last data filed at 10/30/2023 0600 Gross per 24 hour  Intake 1137.5 ml  Output 2850 ml  Net -1712.5 ml     REVIEW OF SYSTEMS:  Unable due to clinical status All other systems were reviewed with the patient and are negative.  PHYSICAL EXAMINATION: ECOG PERFORMANCE STATUS: 3 - Symptomatic, >50% confined to bed  Vitals:   10/30/23 0201 10/30/23 0459  BP: (!) 143/102 (!) 140/95   Pulse: 82 76  Resp: 15 16  Temp: 98.5 F (36.9 C) 98.4 F (36.9 C)  SpO2: 98% 98%   Filed Weights   10/28/23 1613  Weight: 198 lb 6.6 oz (90 kg)    GENERAL: +ill appearing SKIN: +pale skin color, texture, turgor are normal, no rashes or significant lesions EYES: normal, conjunctiva are pink and non-injected, sclera clear OROPHARYNX: +NG tube intact and draining bilious fluid  NECK: supple, thyroid normal size, non-tender, without nodularity LYMPH: no palpable lymphadenopathy in the cervical, axillary or inguinal LUNGS: clear to auscultation and percussion with normal breathing effort HEART: regular rate & rhythm and no murmurs and no lower extremity edema ABDOMEN: abdomen soft, non-tender and normal bowel sounds MUSCULOSKELETAL: no cyanosis of digits and no clubbing  PSYCH: asleep NEURO: no focal motor/sensory deficits   All questions were answered. The patient knows to call the clinic with any problems, questions or concerns.   The total time spent in the appointment was 40 minutes encounter with patient including review of chart and various tests results, discussions about plan of care and coordination of care plan  Dawson Bills, NP 10/30/2023 9:30 AM    Labs Reviewed:  Lab Results  Component Value Date   WBC 7.3 10/30/2023   HGB 14.0 10/30/2023   HCT 44.1 10/30/2023   MCV 85.3 10/30/2023   PLT 216 10/30/2023   Recent Labs    10/28/23 1644 10/29/23 1314 10/30/23 0348  NA 133* 135 134*  K 3.8 5.1 3.9  CL 98 100 98  CO2 24 22 28   GLUCOSE 131* 235* 93  BUN 13 14 17   CREATININE 1.34* 1.52* 1.65*  CALCIUM 9.2 9.5 9.4  GFRNONAA 60* 51* 47*  PROT 7.0 7.1 6.7  ALBUMIN 3.6 3.7 3.5  AST 23 25 24   ALT 17 16 17   ALKPHOS 151* 158* 136*  BILITOT 0.9 0.7 1.1    Studies Reviewed:  DG Abd Portable 1V-Small Bowel Obstruction Protocol-initial, 8 hr delay Result Date: 10/29/2023 CLINICAL DATA:  Small-bowel obstruction EXAM: PORTABLE ABDOMEN - 1 VIEW COMPARISON:   10/29/2023, 10/28/2023 FINDINGS: Single frontal view of the abdomen and pelvis was obtained, excluding the lower pelvis by collimation. Enteric catheter tip and side port project over the gastric fundus. Stable mild gaseous distention of the small bowel consistent with obstruction. Imaging was performed 8 hours after oral contrast administration. Contrast is not identified on this exam, either due to dilution or interval passage. IMPRESSION: 1. Stable findings of small-bowel obstruction. 2. Oral contrast is not easily visualized on this exam, likely due to dilution within fluid-filled loops of bowel. Electronically Signed   By: Sharlet Salina M.D.   On: 10/29/2023 20:35   DG Abd 1 View Result Date: 10/29/2023 CLINICAL DATA:  NG tube placement EXAM: ABDOMEN - 1 VIEW limited for tube placement upright COMPARISON:  CT 10/28/2023 FINDINGS: Limited under penetrated x-ray demonstrates enteric tube in place with tip overlying the upper stomach but the side hole above the GE junction. Recommend this be advanced further several cm to reach the stomach. Nonspecific bowel gas pattern elsewhere in the midabdomen. Few distended small bowel loops identified as on prior CT scan IMPRESSION: Enteric tube in place. Side hole above the GE junction. Recommend further advancement into the stomach Electronically Signed   By: Karen Kays M.D.   On: 10/29/2023 13:55   CT ABDOMEN PELVIS W CONTRAST Result Date: 10/28/2023 CLINICAL DATA:  Abdominal pain, constipation, history of rectal cancer EXAM: CT ABDOMEN AND PELVIS WITH CONTRAST TECHNIQUE: Multidetector CT imaging of the abdomen and pelvis was performed using the standard protocol following bolus administration of intravenous contrast. RADIATION DOSE REDUCTION: This exam was performed according to the departmental dose-optimization program which includes automated exposure control, adjustment of the mA and/or kV according to patient size and/or use of iterative reconstruction  technique. CONTRAST:  OMNIPAQUE IOHEXOL 300 MG/ML  SOLN COMPARISON:  07/02/2023 FINDINGS: Lower chest: Trace left pleural effusion. Minimal left lower lobe atelectasis. Hepatobiliary: Subtle nodular contour of the liver capsule more pronounced on this contrast exam. Numerous subcentimeter hypodensities are seen throughout the liver parenchyma, not grossly changed since the 2023 study and most consistent with hepatic cysts. Given findings elsewhere within the abdomen and pelvis, metastatic disease would be difficult to exclude. Gallbladder is decompressed, with stable calcified gallstones. No evidence of acute cholecystitis. Pancreas: Unremarkable. No pancreatic ductal dilatation or surrounding inflammatory changes. Spleen: Stable borderline splenomegaly. No focal parenchymal abnormality. Adrenals/Urinary Tract: There are multiple bilateral simple appearing renal cortical cysts. Stable complex hemorrhagic cyst lower pole left kidney image 43/2 measuring 1.6 cm. No specific imaging follow-up is required. No urinary tract calculi or obstructive uropathy. The adrenals and bladder are unremarkable. Stomach/Bowel: Since the previous staging exam, circumferential wall thickening of the rectum has progressed, measuring up to 15 mm in thickness reference image 80/2. Multiple loops of dilated jejunum are seen within the central abdomen, measuring up to 3.7 cm in diameter, consistent with small-bowel obstruction. Transition point is seen within the distal jejunum  within the lower central abdomen, compatible small bowel obstruction likely due to postsurgical adhesions or omental caking. Stable right upper quadrant enterostomy. Vascular/Lymphatic: No significant vascular findings. No discrete adenopathy within the abdomen or pelvis. Reproductive: Prostate is unremarkable. Other: Peritoneal carcinomatosis and omental caking again noted throughout the abdomen and pelvis, with interval development of moderate ascites. No free  intraperitoneal gas. No abdominal wall hernia. Musculoskeletal: No acute or destructive bony abnormalities. Reconstructed images demonstrate no additional findings. IMPRESSION: 1. Findings consistent with progression of metastatic rectal cancer. Progressive annular mural thickening of the rectum consistent with worsening primary disease, with interval worsening of omental caking and development of significant malignant ascites. 2. Small bowel obstruction, transition point in the mid to distal jejunum likely as result of omental caking or postsurgical adhesions. 3. Cirrhotic morphology of the liver, with numerous subcentimeter hypodensities throughout the liver parenchyma compatible with cysts. Given findings elsewhere within the abdomen and pelvis, hepatic metastases would be difficult to exclude. 4. Cholelithiasis without evidence of cholecystitis. 5. Small left pleural effusion. Electronically Signed   By: Sharlet Salina M.D.   On: 10/28/2023 19:10   Kenneth [name] was interviewed and examined.  He reports improvement in heartburn and abdominal distention with the NG tube in place.  He continues to have abdominal pain.  He does not have significant pain relief with IV Dilaudid. He reports approximately 600 cc of output from the ileostomy.  Kenneth [name] has a small bowel obstruction, likely secondary to carcinomatosis.  Hopefully the obstruction will improve with bowel rest and NG decompression, but I suspect he will develop recurrent obstruction.  He will benefit from placement of a palliative venting gastrostomy.  Recommendations: Continue management of the bowel obstruction per the surgical service, consider trial of clamping the NG tube and liquid diet if he continues to have significant output from the ileostomy Narcotic analgesics as needed for pain.  I will add sublingual Roxanol and increase the hydromorphone dose Arrange for home hospice care Outpatient follow-up will be scheduled [facility],  please call oncology over the weekend as needed

## 2023-10-30 NOTE — Progress Notes (Signed)
Patient reported having multiple stools during the night in the bedside commode.

## 2023-10-30 NOTE — Progress Notes (Signed)
Patient is hypertensive. On call MD made aware via secure chat. Most recent vitals are T 98.5, HR 85, RR 15, B/P 143/102, O2 98 on RA. Patient is resting his bed at this time. Plan of care ongoing.

## 2023-10-30 NOTE — Progress Notes (Signed)
Progress Note     Subjective: Received ativan just prior to my visit and is drowsy but feels like pain is at least stable to improved. He is less distended. He is not nauseas currently. He has had a lot of ostomy output as well as passed some bloody mucous per rectum. He and wife have discussed a g tube more and are interested but would like to pursue least invasive options first. His goals are to be able to have something by mouth for comfort and to leave the hospital as soon as possible   Objective: Vital signs in last 24 hours: Temp:  [97.8 F (36.6 C)-98.5 F (36.9 C)] 98.4 F (36.9 C) (12/13 0459) Pulse Rate:  [76-86] 76 (12/13 0459) Resp:  [15-19] 16 (12/13 0459) BP: (135-145)/(90-102) 140/95 (12/13 0459) SpO2:  [96 %-100 %] 98 % (12/13 0459) Last BM Date : 10/29/23  Intake/Output from previous day: 12/12 0701 - 12/13 0700 In: 1137.5 [P.O.:180; I.V.:867.5; NG/GT:90] Out: 2850 [Emesis/NG output:2850] Intake/Output this shift: No intake/output data recorded.  PE: General: pleasant, WD, thin male, NAD Heart: regular, rate, and rhythm.  Lungs: Respiratory effort nonlabored Abd: soft, mild distention, mild generalized ttp without peritonitis, ostomy viable with some liquid output, NGT with thin bilious drainage Psych: A&Ox3 with an appropriate affect.    Lab Results:  Recent Labs    10/29/23 1314 10/30/23 0348  WBC 10.3 7.3  HGB 15.2 14.0  HCT 46.2 44.1  PLT 250 216   BMET Recent Labs    10/29/23 1314 10/30/23 0348  NA 135 134*  K 5.1 3.9  CL 100 98  CO2 22 28  GLUCOSE 235* 93  BUN 14 17  CREATININE 1.52* 1.65*  CALCIUM 9.5 9.4   PT/INR No results for input(s): "LABPROT", "INR" in the last 72 hours. CMP     Component Value Date/Time   NA 134 (L) 10/30/2023 0348   K 3.9 10/30/2023 0348   CL 98 10/30/2023 0348   CO2 28 10/30/2023 0348   GLUCOSE 93 10/30/2023 0348   BUN 17 10/30/2023 0348   CREATININE 1.65 (H) 10/30/2023 0348   CREATININE 1.67  (H) 10/23/2023 1115   CALCIUM 9.4 10/30/2023 0348   PROT 6.7 10/30/2023 0348   ALBUMIN 3.5 10/30/2023 0348   AST 24 10/30/2023 0348   AST 23 10/23/2023 1115   ALT 17 10/30/2023 0348   ALT 14 10/23/2023 1115   ALKPHOS 136 (H) 10/30/2023 0348   BILITOT 1.1 10/30/2023 0348   BILITOT 0.6 10/23/2023 1115   GFRNONAA 47 (L) 10/30/2023 0348   GFRNONAA 46 (L) 10/23/2023 1115   GFRAA 84 03/17/2008 1007   Lipase     Component Value Date/Time   LIPASE 24 10/28/2023 1644       Studies/Results: DG Abd Portable 1V-Small Bowel Obstruction Protocol-initial, 8 hr delay Result Date: 10/29/2023 CLINICAL DATA:  Small-bowel obstruction EXAM: PORTABLE ABDOMEN - 1 VIEW COMPARISON:  10/29/2023, 10/28/2023 FINDINGS: Single frontal view of the abdomen and pelvis was obtained, excluding the lower pelvis by collimation. Enteric catheter tip and side port project over the gastric fundus. Stable mild gaseous distention of the small bowel consistent with obstruction. Imaging was performed 8 hours after oral contrast administration. Contrast is not identified on this exam, either due to dilution or interval passage. IMPRESSION: 1. Stable findings of small-bowel obstruction. 2. Oral contrast is not easily visualized on this exam, likely due to dilution within fluid-filled loops of bowel. Electronically Signed   By: Sharlet Salina  M.D.   On: 10/29/2023 20:35   DG Abd 1 View Result Date: 10/29/2023 CLINICAL DATA:  NG tube placement EXAM: ABDOMEN - 1 VIEW limited for tube placement upright COMPARISON:  CT 10/28/2023 FINDINGS: Limited under penetrated x-ray demonstrates enteric tube in place with tip overlying the upper stomach but the side hole above the GE junction. Recommend this be advanced further several cm to reach the stomach. Nonspecific bowel gas pattern elsewhere in the midabdomen. Few distended small bowel loops identified as on prior CT scan IMPRESSION: Enteric tube in place. Side hole above the GE junction.  Recommend further advancement into the stomach Electronically Signed   By: Karen Kays M.D.   On: 10/29/2023 13:55   CT ABDOMEN PELVIS W CONTRAST Result Date: 10/28/2023 CLINICAL DATA:  Abdominal pain, constipation, history of rectal cancer EXAM: CT ABDOMEN AND PELVIS WITH CONTRAST TECHNIQUE: Multidetector CT imaging of the abdomen and pelvis was performed using the standard protocol following bolus administration of intravenous contrast. RADIATION DOSE REDUCTION: This exam was performed according to the departmental dose-optimization program which includes automated exposure control, adjustment of the mA and/or kV according to patient size and/or use of iterative reconstruction technique. CONTRAST:  OMNIPAQUE IOHEXOL 300 MG/ML  SOLN COMPARISON:  07/02/2023 FINDINGS: Lower chest: Trace left pleural effusion. Minimal left lower lobe atelectasis. Hepatobiliary: Subtle nodular contour of the liver capsule more pronounced on this contrast exam. Numerous subcentimeter hypodensities are seen throughout the liver parenchyma, not grossly changed since the 2023 study and most consistent with hepatic cysts. Given findings elsewhere within the abdomen and pelvis, metastatic disease would be difficult to exclude. Gallbladder is decompressed, with stable calcified gallstones. No evidence of acute cholecystitis. Pancreas: Unremarkable. No pancreatic ductal dilatation or surrounding inflammatory changes. Spleen: Stable borderline splenomegaly. No focal parenchymal abnormality. Adrenals/Urinary Tract: There are multiple bilateral simple appearing renal cortical cysts. Stable complex hemorrhagic cyst lower pole left kidney image 43/2 measuring 1.6 cm. No specific imaging follow-up is required. No urinary tract calculi or obstructive uropathy. The adrenals and bladder are unremarkable. Stomach/Bowel: Since the previous staging exam, circumferential wall thickening of the rectum has progressed, measuring up to 15 mm in  thickness reference image 80/2. Multiple loops of dilated jejunum are seen within the central abdomen, measuring up to 3.7 cm in diameter, consistent with small-bowel obstruction. Transition point is seen within the distal jejunum within the lower central abdomen, compatible small bowel obstruction likely due to postsurgical adhesions or omental caking. Stable right upper quadrant enterostomy. Vascular/Lymphatic: No significant vascular findings. No discrete adenopathy within the abdomen or pelvis. Reproductive: Prostate is unremarkable. Other: Peritoneal carcinomatosis and omental caking again noted throughout the abdomen and pelvis, with interval development of moderate ascites. No free intraperitoneal gas. No abdominal wall hernia. Musculoskeletal: No acute or destructive bony abnormalities. Reconstructed images demonstrate no additional findings. IMPRESSION: 1. Findings consistent with progression of metastatic rectal cancer. Progressive annular mural thickening of the rectum consistent with worsening primary disease, with interval worsening of omental caking and development of significant malignant ascites. 2. Small bowel obstruction, transition point in the mid to distal jejunum likely as result of omental caking or postsurgical adhesions. 3. Cirrhotic morphology of the liver, with numerous subcentimeter hypodensities throughout the liver parenchyma compatible with cysts. Given findings elsewhere within the abdomen and pelvis, hepatic metastases would be difficult to exclude. 4. Cholelithiasis without evidence of cholecystitis. 5. Small left pleural effusion. Electronically Signed   By: Sharlet Salina M.D.   On: 10/28/2023 19:10  Anti-infectives: Anti-infectives (From admission, onward)    None        Assessment/Plan Metastatic rectal cancer with carcinomatosis  SBO - NGT output 2850 ml/24h. He has had more stool output per ostomy. Abd xray pending - recommend continue NGT LIWS today given  volume - can have ice chips/sips for comfort - Patient agreeable to g tube but wants to limit invasiveness. have discussed with IR the possibility of IR g tube placement. Given ascites he does have risk for peritonitis with this. They will review. Appreciate their assistance - agree with palliative consult for pain control  - patient and wife have a good understanding that if proceeding with gastrostomy it is purely palliative   FEN: NPO, NGT to LIWS, IVF per primary  VTE: ok to have LMWH or SQH from surgery standpoint - hold for IR eval ID: none    LOS: 2 days   I reviewed hospitalist notes, last 24 h vitals and pain scores, last 48 h intake and output, last 24 h labs and trends, and last 24 h imaging results.   Eric Form, [facility] Surgery 10/30/2023, 8:42 AM Please see Amion for pager number during day hours 7:00am-4:30pm

## 2023-10-30 NOTE — TOC Initial Note (Signed)
Transition of Care [facility]) - Initial/Assessment Note    Patient Details  Name: Kenneth [name] MRN: [id number] Date of Birth: 03/30/1961  Transition of Care [facility]) CM/SW Contact:    Darleene Cleaver, LCSW Phone Number: 10/30/2023, 4:58 PM  Clinical Narrative:                  Patient is a 62 year old male who is alert and oriented x4.  Patient is married, his wife was at bedside.  Patient has cancer, patient and family have requested home with hospice services.  CSW provided patient and family with choice they chose [facility].  CSW made referral to Shawn at [facility].  He will follow up with the patient in regards to home hospice services and what they are able to provide for him.  Palliative is following patient as well.  Per patient and his wife they would like a hospital bed, wheelchair, rolling walker, bedside commode and patient may need peg tube supplies.  TOC to continue to follow patient's progress throughout discharge planning.  Expected Discharge Plan: Home w Hospice Care Barriers to Discharge: Continued Medical Work up   Patient Goals and CMS Choice Patient states their goals for this hospitalization and ongoing recovery are:: To go home with hospice services. CMS Medicare.gov Compare Post Acute Care list provided to:: Patient Choice offered to / list presented to : Patient [place] ownership interest in [facility].provided to:: Patient    Expected Discharge Plan and Services In-house Referral: Clinical Social Work   Post Acute Care Choice: Durable Medical Equipment, Hospice Living arrangements for the past 2 months: Single Family Home                 DME Arranged: Hospital bed, Walker rolling, 3-N-1, Lightweight manual wheelchair with seat cushion, Overbed table, Tube feeding, Tube feeding pump DME Agency: Other - Comment Marcell Anger) Date DME Agency Contacted: 10/30/23 Time DME Agency Contacted: 1530 Representative spoke with at DME Agency: [facility] Agency: Other - See comment ([facility] hosp[facility]) Date HH Agency Contacted: 10/30/23 Time HH Agency Contacted: 1530 Representative spoke with at [facility] Agency: Shawn  Prior Living Arrangements/Services Living arrangements for the past 2 months: Single Family Home Lives with:: Spouse Patient language and need for interpreter reviewed:: Yes Do you feel safe going back to the place where you live?: Yes      Need for Family Participation in Patient Care: No (Comment) Care giver support system in place?: No (comment)   Criminal Activity/Legal Involvement Pertinent to Current Situation/Hospitalization: No - Comment as needed  Activities of Daily Living   ADL Screening (condition at time of admission) Independently performs ADLs?: Yes (appropriate for developmental age) Is the patient deaf or have difficulty hearing?: No Does the patient have difficulty seeing, even when wearing glasses/contacts?: No Does the patient have difficulty concentrating, remembering, or making decisions?: No  Permission Sought/Granted Permission sought to share information with : Case Manager, Family Supports Permission granted to share information with : Yes, Verbal Permission Granted, Yes, Release of Information Signed  Share Information with NAME: Ronrico, Manahan   [phone]  Permission granted to share info w AGENCY: [facility]        Emotional Assessment Appearance:: Appears stated age Attitude/Demeanor/Rapport: Engaged Affect (typically observed): Calm, Appropriate, Accepting, Stable Orientation: : Oriented to Self, Oriented to Place, Oriented to Situation, Oriented to  Time Alcohol / Substance Use: Not Applicable Psych Involvement: No (comment)  Admission diagnosis:  SBO (small  bowel obstruction) (HCC) [K56.609] Rectal cancer (HCC) [C20] Patient Active Problem List   Diagnosis Date Noted   SBO (small bowel obstruction) (HCC) 10/28/2023   Port-A-Cath in place 06/09/2023    Peritoneal carcinomatosis (HCC) 10/30/2022   Stage 3a chronic kidney disease (HCC) 09/25/2022   Drug-induced polyneuropathy (HCC) 09/25/2022   Chronic kidney disease, stage 3 unspecified (HCC) 06/02/2022   Low testosterone 06/02/2022   ED (erectile dysfunction) of organic origin 06/02/2022   Lesion of liver 06/02/2022   Rectal cancer (HCC) 02/06/2022   Gout of right elbow 10/29/2021   Bursitis of right hip 08/15/2020   Olecranon bursitis, right elbow 05/22/2020   Elbow mass, left 03/12/2020   Calcific tendinitis of left elbow 05/23/2019   Degenerative joint disease of knee, left 10/27/2018   Calcific Achilles tendinitis 06/20/2015   HYPOGONADISM 04/13/2009   Lipoprotein deficiency disorder 04/13/2009   ANKLE PAIN, RIGHT 04/13/2009   ERECTILE DYSFUNCTION 03/02/2009   Allergic rhinitis 03/02/2009   FATIGUE 03/02/2009   Essential hypertension 04/05/2008   PSORIASIS 06/15/2007   TENDINITIS, ELBOW 06/15/2007   CHONDROMALACIA 06/15/2007   LIVER FUNCTION TESTS, ABNORMAL 06/15/2007   PCP:  [name], MD Pharmacy:   [facility] [address] [place] [address] Phone: [phone] Fax: [phone]     Social Drivers of Health (SDOH) Social History: SDOH Screenings   Food Insecurity: No Food Insecurity (10/28/2023)  Housing: Low Risk  (10/28/2023)  Transportation Needs: No Transportation Needs (10/28/2023)  Utilities: Not At Risk (10/28/2023)  Depression (PHQ2-9): Low Risk  (01/06/2023)  Financial Resource Strain: Low Risk  (01/06/2023)  Social Connections: Unknown (04/01/2022)   Received from [facility]  Stress: No Stress Concern Present (01/06/2023)  Tobacco Use: High Risk (10/28/2023)   SDOH Interventions:     Readmission Risk Interventions     No data to display

## 2023-10-30 NOTE — Consult Note (Addendum)
Chief Complaint: Rectal cancer with abdominal carcinomatosis with SBO. Request is for gastrostomy tube placement and paracentesis  Referring Physician(s): Carl Best PA  Supervising Physician: Oley Balm  Patient Status: [name] - In-pt  History of Present Illness: Kenneth [name] is a 62 y.o. male 62 y.o, male inpatient. History of metastatic rectal cancer with abdominal carcinomatosis. Presented to the ED at [facility] on 12.11.24 with constipation and abdominal pain. Found to have a SBO. Team is requesting a gastrostomy tube for venting purposes. Case reviewed and approved by IR Attending Dr. Edwyna Ready in conjunction with concurrent paracentesis. Team, Patient and the Patient's wife are all  aware higher risk of peritonitis/abscess postop.   Wife at bedside. Currently without any significant complaints. Patient alert and sitting up in bed,calm. Denies any fevers, headache, chest pain, SOB, cough, abdominal pain, nausea, vomiting or bleeding. NKDA  CT Abd pelvis from 12.11.24 shows the stomach in accessible position for percutaneous access. Peritoneal carcinomatosis, omental caking and ascites is also noted.  All labs and medications are within acceptable parameters. Patient has an NG tube in place  Return precautions and treatment recommendations and follow-up discussed with the patient and his wife. Both who are agreeable with the plan.  The original DNR order is maintained and prior treatment limitations are upheld.     Past Medical History:  Diagnosis Date   Rectal cancer (HCC) 01/09/2022    Past Surgical History:  Procedure Laterality Date   ILEOSTOMY  01/15/2022   IR IMAGING GUIDED PORT INSERTION  02/12/2022   IR US GUIDE BX ASP/DRAIN  02/12/2022    Allergies: Patient has no known allergies.  Medications: Prior to Admission medications   Medication Sig Start Date End Date Taking? Authorizing Provider  albuterol (PROVENTIL HFA) 108 (90 Base) MCG/ACT inhaler  Inhale 2 puffs into the lungs every 6 (six) hours as needed for wheezing or shortness of breath. 12/26/22  Yes [name], MD  Cholecalciferol (VITAMIN D3) 125 MCG (5000 UT) CAPS Take 5,000 Units by mouth daily at 6 (six) AM.   Yes [provider]  Cyanocobalamin (VITAMIN B12 PO) Take by mouth See admin instructions. Place 1 dropperful under the tongue and swallow once a day   Yes [provider]  ferrous sulfate 325 (65 FE) MG tablet Take 325 mg by mouth daily with breakfast.   Yes [provider]  lidocaine (XYLOCAINE) 5 % ointment Apply 1 Application topically as needed (to numb the port site).   Yes [provider]  melatonin (MELATONIN MAXIMUM STRENGTH) 5 MG TABS Take 10 mg by mouth at bedtime. 01/10/22  Yes [provider]  pantoprazole (PROTONIX) 20 MG tablet Take 1 tablet (20 mg total) by mouth daily. Patient taking differently: Take 20 mg by mouth in the morning. 10/26/23  Yes Rana Snare, NP  potassium chloride SA (KLOR-CON M) 20 MEQ tablet Take 1 tablet (20 mEq total) by mouth daily. 10/09/23  Yes [name], MD  traMADol (ULTRAM) 50 MG tablet Take 1 tablet (50 mg total) by mouth every 6 (six) hours as needed for pain. 10/26/23  Yes Rana Snare, NP  TYLENOL PM EXTRA STRENGTH 500-25 MG TABS tablet Take 2-2.5 tablets by mouth at bedtime.   Yes [provider]  dicyclomine (BENTYL) 10 MG capsule Take 1 capsule (10 mg total) by mouth 3 (three) times daily as needed for spasms. Patient not taking: Reported on 10/28/2023 10/09/23   [name], MD  diphenoxylate-atropine (  LOMOTIL) 2.5-0.025 MG tablet Take 2 tablets by mouth 4 (four) times daily as needed. Patient not taking: Reported on 10/28/2023 07/30/23   [name], MD  HYDROcodone-acetaminophen [facility]) 5-325 MG tablet Take 1-2 tablets by mouth every 6 (six) hours as needed for moderate pain (pain score 4-6). For pain not relieved with tramadol Patient not taking:  Reported on 10/28/2023 10/23/23   Rana Snare, NP  lidocaine-prilocaine (EMLA) cream Apply to the affected area as needed. Patient not taking: Reported on 10/28/2023 04/10/23   Rana Snare, NP  tadalafil (CIALIS) 5 MG tablet Take 1 tablet (5 mg total) by mouth daily as needed for erectile dysfunction. Patient not taking: Reported on 10/28/2023 02/18/23   [name], MD  Potassium Chloride ER 20 MEQ TBCR Take 1 tablet (20 MEQ) by mouth once daily. 03/17/22 07/23/22       Family History  Problem Relation Age of Onset   Breast cancer Mother    Leukemia Mother    Pancreatic cancer Father     Social History   Socioeconomic History   Marital status: Married    Spouse name: Not on file   Number of children: Not on file   Years of education: Not on file   Highest education level: Not on file  Occupational History   Not on file  Tobacco Use   Smoking status: Some Days   Smokeless tobacco: Never  Vaping Use   Vaping status: Never Used  Substance and Sexual Activity   Alcohol use: Not Currently    Alcohol/week: 0.0 standard drinks of alcohol   Drug use: Not Currently   Sexual activity: Not on file  Other Topics Concern   Not on file  Social History Narrative   Not on file   Social Drivers of Health   Financial Resource Strain: Low Risk  (01/06/2023)   Overall Financial Resource Strain (CARDIA)    Difficulty of Paying Living Expenses: Not hard at all  Food Insecurity: No Food Insecurity (10/28/2023)   Hunger Vital Sign    Worried About Running Out of Food in the Last Year: Never true    Ran Out of Food in the Last Year: Never true  Transportation Needs: No Transportation Needs (10/28/2023)   PRAPARE - Administrator, Civil Service (Medical): No    Lack of Transportation (Non-Medical): No  Physical Activity: Not on file  Stress: No Stress Concern Present (01/06/2023)   [employer] of Occupational Health - Occupational Stress Questionnaire    Feeling of  Stress : Not at all  Social Connections: Unknown (04/01/2022)   Received from [facility]   Social Network    Social Network: Not on file     Review of Systems: A 12 point ROS discussed and pertinent positives are indicated in the HPI above.  All other systems are negative.  Review of Systems  Constitutional:  Negative for fever.  HENT:  Negative for congestion.   Respiratory:  Negative for cough and shortness of breath.   Cardiovascular:  Negative for chest pain.  Gastrointestinal:  Negative for abdominal pain.  Neurological:  Negative for headaches.  Psychiatric/Behavioral:  Negative for behavioral problems and confusion.     Vital Signs: BP (!) 138/93 (BP Location: Right Arm)   Pulse 85   Temp 97.6 F (36.4 C) (Oral)   Resp 19   Ht 5\' 11"  (1.803 m)   Wt 198 lb 6.6 oz (90 kg)   SpO2 99%  BMI 27.67 kg/m     Physical Exam Vitals and nursing note reviewed.  Constitutional:      Appearance: He is well-developed.  HENT:     Head: Normocephalic.  Cardiovascular:     Rate and Rhythm: Normal rate and regular rhythm.  Pulmonary:     Effort: Pulmonary effort is normal.  Abdominal:     Comments: NG tube in place  Musculoskeletal:        General: Normal range of motion.     Cervical back: Normal range of motion.  Skin:    General: Skin is warm and dry.  Neurological:     General: No focal deficit present.     Mental Status: He is alert and oriented to person, place, and time.  Psychiatric:        Mood and Affect: Mood normal.        Behavior: Behavior normal.     Imaging: DG Abd Portable 1V Result Date: 10/30/2023 CLINICAL DATA:  [id number] SBO (small bowel obstruction) (HCC) [id number] EXAM: PORTABLE ABDOMEN - 1 VIEW COMPARISON:  10/29/2023 FINDINGS: Enteric tube projects within the proximal stomach. Similar degree of small-bowel dilatation within the central abdomen. No enteric contrast is evident, which may be dilute. No gross free intraperitoneal air.  IMPRESSION: Similar degree of small-bowel dilatation within the central abdomen suggestive of ongoing small-bowel obstruction. Electronically Signed   By: Duanne Guess D.O.   On: 10/30/2023 13:55   DG Abd Portable 1V-Small Bowel Obstruction Protocol-initial, 8 hr delay Result Date: 10/29/2023 CLINICAL DATA:  Small-bowel obstruction EXAM: PORTABLE ABDOMEN - 1 VIEW COMPARISON:  10/29/2023, 10/28/2023 FINDINGS: Single frontal view of the abdomen and pelvis was obtained, excluding the lower pelvis by collimation. Enteric catheter tip and side port project over the gastric fundus. Stable mild gaseous distention of the small bowel consistent with obstruction. Imaging was performed 8 hours after oral contrast administration. Contrast is not identified on this exam, either due to dilution or interval passage. IMPRESSION: 1. Stable findings of small-bowel obstruction. 2. Oral contrast is not easily visualized on this exam, likely due to dilution within fluid-filled loops of bowel. Electronically Signed   By: Sharlet Salina M.D.   On: 10/29/2023 20:35   DG Abd 1 View Result Date: 10/29/2023 CLINICAL DATA:  NG tube placement EXAM: ABDOMEN - 1 VIEW limited for tube placement upright COMPARISON:  CT 10/28/2023 FINDINGS: Limited under penetrated x-ray demonstrates enteric tube in place with tip overlying the upper stomach but the side hole above the GE junction. Recommend this be advanced further several cm to reach the stomach. Nonspecific bowel gas pattern elsewhere in the midabdomen. Few distended small bowel loops identified as on prior CT scan IMPRESSION: Enteric tube in place. Side hole above the GE junction. Recommend further advancement into the stomach Electronically Signed   By: Karen Kays M.D.   On: 10/29/2023 13:55   CT ABDOMEN PELVIS W CONTRAST Result Date: 10/28/2023 CLINICAL DATA:  Abdominal pain, constipation, history of rectal cancer EXAM: CT ABDOMEN AND PELVIS WITH CONTRAST TECHNIQUE:  Multidetector CT imaging of the abdomen and pelvis was performed using the standard protocol following bolus administration of intravenous contrast. RADIATION DOSE REDUCTION: This exam was performed according to the departmental dose-optimization program which includes automated exposure control, adjustment of the mA and/or kV according to patient size and/or use of iterative reconstruction technique. CONTRAST:  OMNIPAQUE IOHEXOL 300 MG/ML  SOLN COMPARISON:  07/02/2023 FINDINGS: Lower chest: Trace left pleural effusion. Minimal left lower lobe atelectasis.  Hepatobiliary: Subtle nodular contour of the liver capsule more pronounced on this contrast exam. Numerous subcentimeter hypodensities are seen throughout the liver parenchyma, not grossly changed since the 2023 study and most consistent with hepatic cysts. Given findings elsewhere within the abdomen and pelvis, metastatic disease would be difficult to exclude. Gallbladder is decompressed, with stable calcified gallstones. No evidence of acute cholecystitis. Pancreas: Unremarkable. No pancreatic ductal dilatation or surrounding inflammatory changes. Spleen: Stable borderline splenomegaly. No focal parenchymal abnormality. Adrenals/Urinary Tract: There are multiple bilateral simple appearing renal cortical cysts. Stable complex hemorrhagic cyst lower pole left kidney image 43/2 measuring 1.6 cm. No specific imaging follow-up is required. No urinary tract calculi or obstructive uropathy. The adrenals and bladder are unremarkable. Stomach/Bowel: Since the previous staging exam, circumferential wall thickening of the rectum has progressed, measuring up to 15 mm in thickness reference image 80/2. Multiple loops of dilated jejunum are seen within the central abdomen, measuring up to 3.7 cm in diameter, consistent with small-bowel obstruction. Transition point is seen within the distal jejunum within the lower central abdomen, compatible small bowel obstruction  likely due to postsurgical adhesions or omental caking. Stable right upper quadrant enterostomy. Vascular/Lymphatic: No significant vascular findings. No discrete adenopathy within the abdomen or pelvis. Reproductive: Prostate is unremarkable. Other: Peritoneal carcinomatosis and omental caking again noted throughout the abdomen and pelvis, with interval development of moderate ascites. No free intraperitoneal gas. No abdominal wall hernia. Musculoskeletal: No acute or destructive bony abnormalities. Reconstructed images demonstrate no additional findings. IMPRESSION: 1. Findings consistent with progression of metastatic rectal cancer. Progressive annular mural thickening of the rectum consistent with worsening primary disease, with interval worsening of omental caking and development of significant malignant ascites. 2. Small bowel obstruction, transition point in the mid to distal jejunum likely as result of omental caking or postsurgical adhesions. 3. Cirrhotic morphology of the liver, with numerous subcentimeter hypodensities throughout the liver parenchyma compatible with cysts. Given findings elsewhere within the abdomen and pelvis, hepatic metastases would be difficult to exclude. 4. Cholelithiasis without evidence of cholecystitis. 5. Small left pleural effusion. Electronically Signed   By: Sharlet Salina M.D.   On: 10/28/2023 19:10    Labs:  CBC: Recent Labs    10/23/23 1115 10/28/23 1644 10/29/23 1314 10/30/23 0348  WBC 7.3 8.8 10.3 7.3  HGB 14.9 14.9 15.2 14.0  HCT 45.8 45.9 46.2 44.1  PLT 219 226 250 216    COAGS: No results for input(s): "INR", "APTT" in the last 8760 hours.  BMP: Recent Labs    10/23/23 1115 10/28/23 1644 10/29/23 1314 10/30/23 0348  NA 138 133* 135 134*  K 4.0 3.8 5.1 3.9  CL 100 98 100 98  CO2 30 24 22 28   GLUCOSE 131* 131* 235* 93  BUN 17 13 14 17   CALCIUM 9.3 9.2 9.5 9.4  CREATININE 1.67* 1.34* 1.52* 1.65*  GFRNONAA 46* 60* 51* 47*    LIVER  FUNCTION TESTS: Recent Labs    10/23/23 1115 10/28/23 1644 10/29/23 1314 10/30/23 0348  BILITOT 0.6 0.9 0.7 1.1  AST 23 23 25 24   ALT 14 17 16 17   ALKPHOS 144* 151* 158* 136*  PROT 7.3 7.0 7.1 6.7  ALBUMIN 4.1 3.6 3.7 3.5    TUMOR MARKERS: Recent Labs    03/17/23 0910 07/17/23 0803 07/31/23 1010 09/25/23 1156  CEA 2.29 5.21* 5.50* 7.95*    Assessment and Plan:  62 y.o, male inpatient. History of metastatic rectal cancer with abdominal carcinomatosis. Presented to the ED  at [facility] on 12.11.24 with constipation and abdominal pain. Found to have a SBO. Team is requesting a gastrostomy tube for venting purposes. Case reviewed and approved by IR Attending Dr. Edwyna Ready in conjunction with concurrent paracentesis. Team and patient aware higher risk of peritonitis/abscess postop   PLAN: IR percutaneous placed gastrostomy tube  Team instructed to: Keep Patient to be NPO after midnight  Should patient be discharged prior to the procedure please notify IR for further instruction  IR will call patient when ready.  Risks and benefits image guided gastrostomy tube placement  and paracentesis was discussed with the patient including, but not limited to the need for a barium enema during the procedure, bleeding, infection, peritonitis and/or damage to adjacent structures.  All of the patient's  and his wife's questions were answered,. Both the patient and his wife are agreeable to proceed knowing the increased risks.   Consent X 2 signed and in chart.   Thank you for this interesting consult.  I greatly enjoyed meeting ANTAWAN LAMONS and look forward to participating in their care.  A copy of this report was sent to the requesting provider on this date.  Electronically Signed: Alene Mires, NP 10/30/2023, 3:08 PM   I spent a total of 40 Minutes    in face to face in clinical consultation, greater than 50% of which was counseling/coordinating care for paracentesis and  gastrostomy tube placement

## 2023-10-31 ENCOUNTER — Inpatient Hospital Stay (HOSPITAL_COMMUNITY): Payer: BC Managed Care – PPO

## 2023-10-31 DIAGNOSIS — C20 Malignant neoplasm of rectum: Secondary | ICD-10-CM | POA: Diagnosis not present

## 2023-10-31 DIAGNOSIS — Z66 Do not resuscitate: Secondary | ICD-10-CM | POA: Diagnosis not present

## 2023-10-31 DIAGNOSIS — C786 Secondary malignant neoplasm of retroperitoneum and peritoneum: Secondary | ICD-10-CM | POA: Diagnosis not present

## 2023-10-31 DIAGNOSIS — Z7189 Other specified counseling: Secondary | ICD-10-CM | POA: Diagnosis not present

## 2023-10-31 DIAGNOSIS — Z515 Encounter for palliative care: Secondary | ICD-10-CM | POA: Diagnosis not present

## 2023-10-31 DIAGNOSIS — G620 Drug-induced polyneuropathy: Secondary | ICD-10-CM | POA: Diagnosis not present

## 2023-10-31 DIAGNOSIS — K56609 Unspecified intestinal obstruction, unspecified as to partial versus complete obstruction: Secondary | ICD-10-CM | POA: Diagnosis not present

## 2023-10-31 LAB — CBC WITH DIFFERENTIAL/PLATELET
Abs Immature Granulocytes: 0.02 10*3/uL (ref 0.00–0.07)
Basophils Absolute: 0 10*3/uL (ref 0.0–0.1)
Basophils Relative: 0 %
Eosinophils Absolute: 0.1 10*3/uL (ref 0.0–0.5)
Eosinophils Relative: 2 %
HCT: 40.8 % (ref 39.0–52.0)
Hemoglobin: 13.2 g/dL (ref 13.0–17.0)
Immature Granulocytes: 0 %
Lymphocytes Relative: 10 %
Lymphs Abs: 0.7 10*3/uL (ref 0.7–4.0)
MCH: 27.6 pg (ref 26.0–34.0)
MCHC: 32.4 g/dL (ref 30.0–36.0)
MCV: 85.4 fL (ref 80.0–100.0)
Monocytes Absolute: 0.6 10*3/uL (ref 0.1–1.0)
Monocytes Relative: 8 %
Neutro Abs: 5.4 10*3/uL (ref 1.7–7.7)
Neutrophils Relative %: 80 %
Platelets: 197 10*3/uL (ref 150–400)
RBC: 4.78 MIL/uL (ref 4.22–5.81)
RDW: 13.7 % (ref 11.5–15.5)
WBC: 6.8 10*3/uL (ref 4.0–10.5)
nRBC: 0 % (ref 0.0–0.2)

## 2023-10-31 LAB — COMPREHENSIVE METABOLIC PANEL
ALT: 17 U/L (ref 0–44)
AST: 25 U/L (ref 15–41)
Albumin: 3.4 g/dL — ABNORMAL LOW (ref 3.5–5.0)
Alkaline Phosphatase: 121 U/L (ref 38–126)
Anion gap: 11 (ref 5–15)
BUN: 19 mg/dL (ref 8–23)
CO2: 26 mmol/L (ref 22–32)
Calcium: 8.3 mg/dL — ABNORMAL LOW (ref 8.9–10.3)
Chloride: 96 mmol/L — ABNORMAL LOW (ref 98–111)
Creatinine, Ser: 1.6 mg/dL — ABNORMAL HIGH (ref 0.61–1.24)
GFR, Estimated: 48 mL/min — ABNORMAL LOW (ref >60)
Glucose, Bld: 86 mg/dL (ref 70–99)
Potassium: 3.6 mmol/L (ref 3.5–5.1)
Sodium: 133 mmol/L — ABNORMAL LOW (ref 135–145)
Total Bilirubin: 1.1 mg/dL (ref <1.2)
Total Protein: 6.6 g/dL (ref 6.5–8.1)

## 2023-10-31 LAB — MAGNESIUM: Magnesium: 2.2 mg/dL (ref 1.7–2.4)

## 2023-10-31 LAB — PHOSPHORUS: Phosphorus: 2.9 mg/dL (ref 2.5–4.6)

## 2023-10-31 MED ORDER — KETOROLAC TROMETHAMINE 15 MG/ML IJ SOLN
7.5000 mg | Freq: Once | INTRAMUSCULAR | Status: AC
  Administered 2023-10-31: 7.5 mg via INTRAVENOUS
  Filled 2023-10-31: qty 1

## 2023-10-31 MED ORDER — LACTATED RINGERS IV SOLN
INTRAVENOUS | Status: AC
  Administered 2023-10-31: 75 mL/h via INTRAVENOUS

## 2023-10-31 NOTE — Progress Notes (Signed)
Messaged Anthoney Harada, NP of pt's reports of having a headache, throat hurting and the medications given for pain (Dilaudid and morphine) and that he has Chloraseptic spray at bedside. But that this spray wasn't helping him. Orders received for treatment of headache (Toradol and Compazine).

## 2023-10-31 NOTE — Plan of Care (Signed)
  Problem: Activity: Goal: Risk for activity intolerance will decrease Outcome: Progressing   Problem: Elimination: Goal: Will not experience complications related to urinary retention Outcome: Progressing   

## 2023-10-31 NOTE — Progress Notes (Signed)
Subjective/Chief Complaint: Complains of pain and reflux   Objective: Vital signs in last 24 hours: Temp:  [97.6 F (36.4 C)-98.9 F (37.2 C)] 98.3 F (36.8 C) (12/14 0508) Pulse Rate:  [79-85] 79 (12/14 0508) Resp:  [15-19] 16 (12/14 0508) BP: (134-142)/(86-93) 134/86 (12/14 0508) SpO2:  [95 %-99 %] 97 % (12/14 0508) Last BM Date : 10/29/23  Intake/Output from previous day: 12/13 0701 - 12/14 0700 In: 1639.2 [P.O.:120; I.V.:1401.1; IV Piggyback:118.1] Out: 1950 [Urine:150; Emesis/NG output:1600; Stool:200] Intake/Output this shift: No intake/output data recorded.  General appearance: alert and cooperative Resp: clear to auscultation bilaterally Cardio: regular rate and rhythm GI: Firm  Lab Results:  Recent Labs    10/30/23 0348 10/31/23 0318  WBC 7.3 6.8  HGB 14.0 13.2  HCT 44.1 40.8  PLT 216 197   BMET Recent Labs    10/30/23 0348 10/31/23 0318  NA 134* 133*  K 3.9 3.6  CL 98 96*  CO2 28 26  GLUCOSE 93 86  BUN 17 19  CREATININE 1.65* 1.60*  CALCIUM 9.4 8.3*   PT/INR No results for input(s): "LABPROT", "INR" in the last 72 hours. ABG No results for input(s): "PHART", "HCO3" in the last 72 hours.  Invalid input(s): "PCO2", "PO2"  Studies/Results: DG Abd Portable 1V Result Date: 10/30/2023 CLINICAL DATA:  [id number] SBO (small bowel obstruction) (HCC) [id number] EXAM: PORTABLE ABDOMEN - 1 VIEW COMPARISON:  10/29/2023 FINDINGS: Enteric tube projects within the proximal stomach. Similar degree of small-bowel dilatation within the central abdomen. No enteric contrast is evident, which may be dilute. No gross free intraperitoneal air. IMPRESSION: Similar degree of small-bowel dilatation within the central abdomen suggestive of ongoing small-bowel obstruction. Electronically Signed   By: Duanne Guess D.O.   On: 10/30/2023 13:55   DG Abd Portable 1V-Small Bowel Obstruction Protocol-initial, 8 hr delay Result Date: 10/29/2023 CLINICAL DATA:  Small-bowel  obstruction EXAM: PORTABLE ABDOMEN - 1 VIEW COMPARISON:  10/29/2023, 10/28/2023 FINDINGS: Single frontal view of the abdomen and pelvis was obtained, excluding the lower pelvis by collimation. Enteric catheter tip and side port project over the gastric fundus. Stable mild gaseous distention of the small bowel consistent with obstruction. Imaging was performed 8 hours after oral contrast administration. Contrast is not identified on this exam, either due to dilution or interval passage. IMPRESSION: 1. Stable findings of small-bowel obstruction. 2. Oral contrast is not easily visualized on this exam, likely due to dilution within fluid-filled loops of bowel. Electronically Signed   By: Sharlet Salina M.D.   On: 10/29/2023 20:35   DG Abd 1 View Result Date: 10/29/2023 CLINICAL DATA:  NG tube placement EXAM: ABDOMEN - 1 VIEW limited for tube placement upright COMPARISON:  CT 10/28/2023 FINDINGS: Limited under penetrated x-ray demonstrates enteric tube in place with tip overlying the upper stomach but the side hole above the GE junction. Recommend this be advanced further several cm to reach the stomach. Nonspecific bowel gas pattern elsewhere in the midabdomen. Few distended small bowel loops identified as on prior CT scan IMPRESSION: Enteric tube in place. Side hole above the GE junction. Recommend further advancement into the stomach Electronically Signed   By: Karen Kays M.D.   On: 10/29/2023 13:55    Anti-infectives: Anti-infectives (From admission, onward)    None       Assessment/Plan: s/p * No surgery found * Metastatic gastric cancer Continue NG and bowel rest Plan for IR G-tube possibly on Monday  LOS: 3 days    Kenneth [name]  10/31/2023  

## 2023-10-31 NOTE — Progress Notes (Signed)
PROGRESS NOTE    Kenneth [name]  JSE:[id number] DOB: 1961/01/23 DOA: 10/28/2023 PCP: [name], MD   Brief Narrative:  HPI per Dr. Buena [name] on 10/28/23 Kenneth [name] is a 62 y.o. male with medical history significant for terminal rectal cancer per wife's report.  He has malignant ascites, omental caking, and symptomatic rectal thickening and a hospice referral was placed last week.  He has rectal discharge of mucous and some blood up 5 times a day. Yesterday he started having increased gas and acid reflux.  It got much worse after eating.  He had no stool in his colostomy for 24 hours.  His wife called oncologist and was advised to try Miralax but if no bm to come to the ED. while he has not had a BM he did have some gas expressed into his colostomy bag after the MiraLAX.  And that did help to relieve the pressure discomfort that he was feeling.  At the time of my evaluation he rated his abdominal pressure discomfort at 2.  He does have a stronger pain in his rectum.  He takes tramadol for that at home which is usually effective.  **Interim History Continues to have some abdominal discomfort and pain and felt nauseous her medications have been adjusted and added. Surgery, medical oncology palliative care has been consulted for further care and now the recommendation is for a palliative venting G-tube however unfortunately cannot be done until Monday, 11/02/2023.  Assessment and Plan:  SBO  -The surgeon has recommended an NG tube but was declined at the time of admission but then decided to have it placed early this morning.  Is now connected to intermittent suction and he has copious bilious fluid noted and had at least 2 L output yesterday and they may consider a clamping trial liquid diet for next few days but is likely the obstruction will likely recur and he will benefit from a venting gastrostomy tube -He has had a little bit of gas output in his colostomy bag and the  abdominal discomfort still persistent so we will continue with antiemetics and pain medicine skin -Surgery is evaluated and requesting and recommend a palliative venting gastrostomy tube placement with bowel rest to see if this improves his bowel obstruction; -Continues to have significant abdominal discomfort and pain -Currently getting Gastrografin -General Surgery is recommending considering IR consult to see if they will place venting palliative gastrostomy tube to see if this will be less invasive than surgical gastrostomy; IR consulted and was concerned for risk of peritonitis with g tube given the ascites but this was discussed this with patient and he would like to proceed with g tube. IR willing but unable to do until Monday 11/02/23 -Continue n.p.o. and continue with fluid hydration with LR at 75 mL/h for 1 day as well as antiemetics with ondansetron 4 mg IV every 6 as needed for nausea vomiting and prochlorperazine 10 mg every 6.  For refractory nausea vomiting -Continue with famotidine 20 mg IV every 24 and pantoprazole 40 mg every 24 -Continue with IV lorazepam for anxiety and baclofen for singultus -KUB done yesterday and showed "Similar degree of small-bowel dilatation within the central abdomen suggestive of ongoing small-bowel obstruction." -General Surgery recommending continuing NG tube and bowel rest   Metastatic Rectal Cancer with Peritoneal and Abdominal Carcinomatosis  -Status post chemotherapy with FOLFOX x 7 cycles and cycle 3 was held due to toxicity and he is also status post radiation therapy plus Xeloda  in 2023 -Has an ileostomy and has had chronic high output but now does not have urinary output. -Hospice referral had been made prior to this SBO.   -Consulted Oncology Dr [name] and palliative care in the am. -He was getting IV fluids 2-3 times a week as an outpatient at the cancer center.  Will go ahead and order IV fluids for now and changed to IVF with LR at 75 mL/hr  and will continue for today -Has decided to forego any further chemotherapy -Palliative consulted for goals of care discussion while he is hospitalized but apparently the patient has a referral to [facility] at home in outpatient setting -Medical oncology to discuss hospice care with the patient as well and patient is agreeable to hospice services and this is going to be arranged -Medical oncology has added sublingual morphine concentrate 10 to 20 mg every 4 as needed for pain and have increased his hydromorphone dose to 0.5-1 mL grams every 3 hours as needed for moderate pain  CKD Stage 3a -BUN/Cr Trend: Recent Labs  Lab 10/23/23 1115 10/28/23 1644 10/29/23 1314 10/30/23 0348 10/31/23 0318  BUN 17 13 14 17 19   CREATININE 1.67* 1.34* 1.52* 1.65* 1.60*  -Resume IVF with LR at 75 mL/hr x 1 Day  -Avoid Nephrotoxic Medications, Contrast Dyes, Hypotension and Dehydration to Ensure Adequate Renal Perfusion and will need to Renally Adjust Meds -Continue to Monitor and Trend Renal Function carefully and repeat CMP in the AM   Hyponatremia -Na+ Trend: Recent Labs  Lab 10/23/23 1115 10/28/23 1644 10/29/23 1314 10/30/23 0348 10/31/23 0318  NA 138 133* 135 134* 133*  -Continue to Monitor and Trend and repeat CMP in the AM  GERD/GI Prophylaxis -Continue with Pantoprazole 40 mg IV every 24 and Famotidine 20 g IV every 24  Hypoalbuminemia -Patient's Albumin Trend: Recent Labs  Lab 10/23/23 1115 10/28/23 1644 10/29/23 1314 10/30/23 0348 10/31/23 0318  ALBUMIN 4.1 3.6 3.7 3.5 3.4*  -Continue to Monitor and Trend and repeat CMP in the AM  Overweight -Complicates overall prognosis and care -Estimated body mass index is 27.67 kg/m as calculated from the following:   Height as of this encounter: 5\' 11"  (1.803 m).   Weight as of this encounter: 90 kg.  -Weight Loss and Dietary Counseling given  Tobacco Abuse -Smoking cessation counseling given the patient is still  smoking   DVT prophylaxis: SCDs    Code Status: Do not attempt resuscitation (DNR) - Comfort care Family Communication: Discussed with wife at bedside  Disposition Plan:  Level of care: Med-Surg Status is: Inpatient Remains inpatient appropriate because: Needs further clinical improvement and placement of the palliative G-tube which cannot be done until Monday   Consultants:  Palliative Care Medical Oncology General Surgery Interventional Radiology  Procedures:  As delineated as above  Antimicrobials:  Anti-infectives (From admission, onward)    None       Subjective: Seen and examined at bedside and he was little frustrated but more comfortable today compared to yesterday.  Continues to have reflux which is pretty significant.  Having a little bit more output in his ostomy but abdomen is still tender.  No other concerns or complaints this time.  Objective: Vitals:   10/30/23 1342 10/30/23 2207 10/31/23 0508 10/31/23 1345  BP: (!) 138/93 (!) 142/92 134/86 133/87  Pulse: 85 85 79 81  Resp: 19 15 16 19   Temp: 97.6 F (36.4 C) 98.9 F (37.2 C) 98.3 F (36.8 C) 97.6 F (36.4 C)  TempSrc: Oral Oral Oral Oral  SpO2: 99% 95% 97%   Weight:      Height:        Intake/Output Summary (Last 24 hours) at 10/31/2023 1412 Last data filed at 10/31/2023 1400 Gross per 24 hour  Intake 971.06 ml  Output 1700 ml  Net -728.94 ml   Filed Weights   10/28/23 1613  Weight: 90 kg   Examination: Physical Exam:  Constitutional: Overweight chronically ill-appearing Caucasian male who appears a bit more comfortable getting his ostomy changed Respiratory: Diminished to auscultation bilaterally, no wheezing, rales, rhonchi or crackles. Normal respiratory effort and patient is not tachypenic. No accessory muscle use.  Unlabored breathing Cardiovascular: RRR, no murmurs / rubs / gallops. S1 and S2 auscultated. No extremity edema.  Abdomen: Soft, slightly tender to palpate and distended  with ileostomy with some output. Bowel sounds positive.  GU: Deferred. Musculoskeletal: No clubbing / cyanosis of digits/nails. No joint deformity upper and lower extremities.  Skin: No rashes, lesions, ulcers on limited skin evaluation. No induration; Warm and dry.  Neurologic: CN 2-12 grossly intact with no focal deficits. Romberg sign and cerebellar reflexes not assessed.  Psychiatric: Normal judgment and insight. Alert and oriented x 3. Normal mood and appropriate affect.   Data Reviewed: I have personally reviewed following labs and imaging studies  CBC: Recent Labs  Lab 10/28/23 1644 10/29/23 1314 10/30/23 0348 10/31/23 0318  WBC 8.8 10.3 7.3 6.8  NEUTROABS  --  9.1* 5.7 5.4  HGB 14.9 15.2 14.0 13.2  HCT 45.9 46.2 44.1 40.8  MCV 85.3 84.6 85.3 85.4  PLT 226 250 216 197   Basic Metabolic Panel: Recent Labs  Lab 10/28/23 1644 10/29/23 1314 10/30/23 0348 10/31/23 0318  NA 133* 135 134* 133*  K 3.8 5.1 3.9 3.6  CL 98 100 98 96*  CO2 24 22 28 26   GLUCOSE 131* 235* 93 86  BUN 13 14 17 19   CREATININE 1.34* 1.52* 1.65* 1.60*  CALCIUM 9.2 9.5 9.4 8.3*  MG  --  1.8 1.8 2.2  PHOS  --  2.7 3.3 2.9   GFR: Estimated Creatinine Clearance: 51 mL/min (A) (by C-G formula based on SCr of 1.6 mg/dL (H)). Liver Function Tests: Recent Labs  Lab 10/28/23 1644 10/29/23 1314 10/30/23 0348 10/31/23 0318  AST 23 25 24 25   ALT 17 16 17 17   ALKPHOS 151* 158* 136* 121  BILITOT 0.9 0.7 1.1 1.1  PROT 7.0 7.1 6.7 6.6  ALBUMIN 3.6 3.7 3.5 3.4*   Recent Labs  Lab 10/28/23 1644  LIPASE 24   No results for input(s): "AMMONIA" in the last 168 hours. Coagulation Profile: No results for input(s): "INR", "PROTIME" in the last 168 hours. Cardiac Enzymes: No results for input(s): "CKTOTAL", "CKMB", "CKMBINDEX", "TROPONINI" in the last 168 hours. BNP (last 3 results) No results for input(s): "PROBNP" in the last 8760 hours. HbA1C: No results for input(s): "HGBA1C" in the last 72  hours. CBG: No results for input(s): "GLUCAP" in the last 168 hours. Lipid Profile: No results for input(s): "CHOL", "HDL", "LDLCALC", "TRIG", "CHOLHDL", "LDLDIRECT" in the last 72 hours. Thyroid Function Tests: No results for input(s): "TSH", "T4TOTAL", "FREET4", "T3FREE", "THYROIDAB" in the last 72 hours. Anemia Panel: No results for input(s): "VITAMINB12", "FOLATE", "FERRITIN", "TIBC", "IRON", "RETICCTPCT" in the last 72 hours. Sepsis Labs: No results for input(s): "PROCALCITON", "LATICACIDVEN" in the last 168 hours.  No results found for this or any previous visit (from the past 240 hours).   Radiology  Studies: DG Abd Portable 1V Result Date: 10/30/2023 CLINICAL DATA:  [id number] SBO (small bowel obstruction) (HCC) [id number] EXAM: PORTABLE ABDOMEN - 1 VIEW COMPARISON:  10/29/2023 FINDINGS: Enteric tube projects within the proximal stomach. Similar degree of small-bowel dilatation within the central abdomen. No enteric contrast is evident, which may be dilute. No gross free intraperitoneal air. IMPRESSION: Similar degree of small-bowel dilatation within the central abdomen suggestive of ongoing small-bowel obstruction. Electronically Signed   By: Duanne Guess D.O.   On: 10/30/2023 13:55   DG Abd Portable 1V-Small Bowel Obstruction Protocol-initial, 8 hr delay Result Date: 10/29/2023 CLINICAL DATA:  Small-bowel obstruction EXAM: PORTABLE ABDOMEN - 1 VIEW COMPARISON:  10/29/2023, 10/28/2023 FINDINGS: Single frontal view of the abdomen and pelvis was obtained, excluding the lower pelvis by collimation. Enteric catheter tip and side port project over the gastric fundus. Stable mild gaseous distention of the small bowel consistent with obstruction. Imaging was performed 8 hours after oral contrast administration. Contrast is not identified on this exam, either due to dilution or interval passage. IMPRESSION: 1. Stable findings of small-bowel obstruction. 2. Oral contrast is not easily visualized on  this exam, likely due to dilution within fluid-filled loops of bowel. Electronically Signed   By: Sharlet Salina M.D.   On: 10/29/2023 20:35   Scheduled Meds:  Chlorhexidine Gluconate Cloth  6 each Topical Daily   lidocaine  1 Application Other Once   pantoprazole (PROTONIX) IV  40 mg Intravenous Q24H   sodium chloride flush  10-40 mL Intracatheter Q12H   Continuous Infusions:  famotidine (PEPCID) IV 20 mg (10/30/23 1726)   lactated ringers 75 mL/hr (10/31/23 1231)    LOS: 3 days   Marguerita Merles, DO [facility] Available via Epic secure chat 7am-7pm After these hours, please refer to coverage provider listed on [email or web address] 10/31/2023, 2:12 PM

## 2023-10-31 NOTE — Progress Notes (Signed)
Daily Progress Note   Patient Name: Kenneth [name]       Date: 10/31/2023 DOB: 02-01-1961  Age: 62 y.o. MRN#: [id number] Attending Physician: Kenneth Laughter, DO Primary Care Physician: Kenneth Artist, MD Admit Date: 10/28/2023 Length of Stay: 3 days  Reason for Consultation/Follow-up: Establishing goals of care  HPI/Patient Profile:  62 y.o. male  with past medical history of terminal rectal cancer per wife's report. He has malignant ascites, omental caking, and symptomatic rectal thickening and a hospice referral was placed last week. He presented to the ER for abdominal pain, gas, reflux and no BM in some time. Oncology recommended coming to the ER. He was admitted on 10/28/2023 with malignant SBO, metastatic rectal cancer with peritoneal and abdominal carcinomatosis, and others.    Palliative medicine was consulted for GOC conversations.  Subjective:   Subjective: Chart Reviewed. Updates received. Patient Assessed. Created space and opportunity for patient  and family to explore thoughts and feelings regarding current medical situation.  Today's Discussion: Today we initially went to the patient's bedside he was sleeping and I elected not to wake him.  Later in the day I returned the patient was awake.  He appears more comfortable today.  He states his pain is tolerable right now, noted copious amount of output in the NG tube.  We discussed his enjoyment going outside today for some fresh air.  He states last night was pretty difficult in relation to pain, however it is better controlled today.  We discussed upcoming G-tube.  We discussed the mechanics and how this would allow him to eat for comfort but he would not be absorbing much nutrition.  We discussed all this will limit his time to likely weeks.  We continued to allow his exploration of his own mortality and the emotional aspects of this.  The patient's mother-in-law and wife joined the conversation about halfway through.   The patient has elected to go home with hospice.  We explained some details on hospice and how they would provide equipment, medications.  They will take over his symptom management when he discharged from the hospital.  I told him that somebody from palliative medicine will follow-up each day that he is here. I provided emotional and general support through therapeutic listening, empathy, sharing of stories, therapeutic touch, and other techniques. I answered all questions and addressed all concerns to the best of my ability.  Review of Systems  Respiratory:  Negative for chest tightness and shortness of breath.   Cardiovascular:  Negative for chest pain.  Gastrointestinal:  Positive for abdominal pain (Improved). Negative for nausea and vomiting.    Objective:   Vital Signs:  BP 133/87 (BP Location: Right Arm)   Pulse 81   Temp 97.6 F (36.4 C) (Oral)   Resp 19   Ht 5\' 11"  (1.803 m)   Wt 90 kg   SpO2 97%   BMI 27.67 kg/m   Physical Exam Vitals and nursing note reviewed.  Constitutional:      General: He is not in acute distress.    Appearance: He is ill-appearing.  HENT:     Head: Normocephalic and atraumatic.  Cardiovascular:     Rate and Rhythm: Normal rate.  Pulmonary:     Effort: Pulmonary effort is normal.  Abdominal:     General: Abdomen is flat.  Skin:    General: Skin is warm and dry.  Neurological:     General: No focal deficit present.  Mental Status: He is alert.  Psychiatric:        Mood and Affect: Mood normal.        Behavior: Behavior normal.     Palliative Assessment/Data: 50%    Existing Vynca/ACP Documentation: Advance directive signed 01/26/2006   Assessment & Plan:   Impression: Present on Admission:  Rectal cancer (HCC)  Peritoneal carcinomatosis (HCC)  Drug-induced polyneuropathy (HCC)  Stage 3a chronic kidney disease (HCC)  SBO (small bowel obstruction) (HCC)  62 year old male with acute presentation of chronic comorbidities  as described above.  The patient has terminal rectal cancer and now presents with malignant SBO which complicates his picture further.  He understands this time is very limited.  He is electing for a venting G-tube for palliative/comfort purposes.  He is also electing to engage with hospice for home hospice at discharge.  Plans are for IR placement of venting G-tube on Monday.  Remain in house for symptom management until that time.  [facility] collective is already on board.  Long-term prognosis grave.  SUMMARY OF RECOMMENDATIONS   Remain DNR-comfort Anticipate G-tube placement on Monday Continue symptom management and current scope of treatment for now Palliative medicine will continue to follow  Symptom Management:  Per oncology team Palliative medicine is available to assist if needed  Code Status: DNR-comfort  Prognosis: < 4 weeks  Discharge Planning: Home with Hospice  Discussed with: Patient, family, medical team, nursing team  Thank you for allowing [place] to participate in the care of Kenneth [name] PMT will continue to support holistically.  Time Total: 45 min  Detailed review of medical records (labs, imaging, vital signs), medically appropriate exam, discussed with treatment team, counseling and education to patient, family, & staff, documenting clinical information, medication management, coordination of care  Kenneth Dust, NP Palliative Medicine Team  Team Phone # [phone] (Nights/Weekends)  07/16/2021, 8:17 AM

## 2023-11-01 DIAGNOSIS — G620 Drug-induced polyneuropathy: Secondary | ICD-10-CM | POA: Diagnosis not present

## 2023-11-01 DIAGNOSIS — C786 Secondary malignant neoplasm of retroperitoneum and peritoneum: Secondary | ICD-10-CM | POA: Diagnosis not present

## 2023-11-01 DIAGNOSIS — K56609 Unspecified intestinal obstruction, unspecified as to partial versus complete obstruction: Secondary | ICD-10-CM | POA: Diagnosis not present

## 2023-11-01 DIAGNOSIS — C20 Malignant neoplasm of rectum: Secondary | ICD-10-CM | POA: Diagnosis not present

## 2023-11-01 LAB — COMPREHENSIVE METABOLIC PANEL
ALT: 18 U/L (ref 0–44)
AST: 29 U/L (ref 15–41)
Albumin: 3.5 g/dL (ref 3.5–5.0)
Alkaline Phosphatase: 128 U/L — ABNORMAL HIGH (ref 38–126)
Anion gap: 9 (ref 5–15)
BUN: 21 mg/dL (ref 8–23)
CO2: 28 mmol/L (ref 22–32)
Calcium: 8.6 mg/dL — ABNORMAL LOW (ref 8.9–10.3)
Chloride: 99 mmol/L (ref 98–111)
Creatinine, Ser: 1.22 mg/dL (ref 0.61–1.24)
GFR, Estimated: >60 mL/min (ref >60)
Glucose, Bld: 79 mg/dL (ref 70–99)
Potassium: 3.5 mmol/L (ref 3.5–5.1)
Sodium: 136 mmol/L (ref 135–145)
Total Bilirubin: 1.4 mg/dL — ABNORMAL HIGH (ref <1.2)
Total Protein: 6.7 g/dL (ref 6.5–8.1)

## 2023-11-01 LAB — CBC WITH DIFFERENTIAL/PLATELET
Abs Immature Granulocytes: 0.01 10*3/uL (ref 0.00–0.07)
Basophils Absolute: 0 10*3/uL (ref 0.0–0.1)
Basophils Relative: 0 %
Eosinophils Absolute: 0.1 10*3/uL (ref 0.0–0.5)
Eosinophils Relative: 2 %
HCT: 41.8 % (ref 39.0–52.0)
Hemoglobin: 13.2 g/dL (ref 13.0–17.0)
Immature Granulocytes: 0 %
Lymphocytes Relative: 11 %
Lymphs Abs: 0.7 10*3/uL (ref 0.7–4.0)
MCH: 27.2 pg (ref 26.0–34.0)
MCHC: 31.6 g/dL (ref 30.0–36.0)
MCV: 86.2 fL (ref 80.0–100.0)
Monocytes Absolute: 0.7 10*3/uL (ref 0.1–1.0)
Monocytes Relative: 10 %
Neutro Abs: 5.3 10*3/uL (ref 1.7–7.7)
Neutrophils Relative %: 77 %
Platelets: 217 10*3/uL (ref 150–400)
RBC: 4.85 MIL/uL (ref 4.22–5.81)
RDW: 13.6 % (ref 11.5–15.5)
WBC: 6.8 10*3/uL (ref 4.0–10.5)
nRBC: 0 % (ref 0.0–0.2)

## 2023-11-01 LAB — PHOSPHORUS: Phosphorus: 2.8 mg/dL (ref 2.5–4.6)

## 2023-11-01 LAB — MAGNESIUM: Magnesium: 2.4 mg/dL (ref 1.7–2.4)

## 2023-11-01 MED ORDER — TRAMADOL HCL 50 MG PO TABS
50.0000 mg | ORAL_TABLET | Freq: Four times a day (QID) | ORAL | Status: DC | PRN
  Filled 2023-11-01: qty 1

## 2023-11-01 MED ORDER — LACTATED RINGERS IV SOLN: INTRAVENOUS | Status: DC

## 2023-11-01 MED ORDER — LACTATED RINGERS IV SOLN: INTRAVENOUS | Status: AC

## 2023-11-01 NOTE — Progress Notes (Signed)
Spoke w pt's MD and [facility] about order for pt to leave floor to smoke w staff. Instructed that we do not currently have staffing available to provide for this. After a long discussion w the above, instructed pt and his wife that per order and for safety reasons, they must have a staff member w the pt when he leaves the floor to smoke. They both verbalized understanding, but then repeatedly left the floor w no staff member so that the pt could smoke. Also d/w NP w Hospice who gave verbal order to d/c this order.

## 2023-11-01 NOTE — Progress Notes (Signed)
PROGRESS NOTE    Kenneth [name]  MWN:[id number] DOB: [birth year] DOA: 10/28/2023 PCP: [name], MD   Brief Narrative:  HPI per Dr. Buena [name] on 10/28/23 Kenneth [name] is a 62 y.o. male with medical history significant for terminal rectal cancer per wife's report.  He has malignant ascites, omental caking, and symptomatic rectal thickening and a hospice referral was placed last week.  He has rectal discharge of mucous and some blood up 5 times a day. Yesterday he started having increased gas and acid reflux.  It got much worse after eating.  He had no stool in his colostomy for 24 hours.  His wife called oncologist and was advised to try Miralax but if no bm to come to the ED. while he has not had a BM he did have some gas expressed into his colostomy bag after the MiraLAX.  And that did help to relieve the pressure discomfort that he was feeling.  At the time of my evaluation he rated his abdominal pressure discomfort at 2.  He does have a stronger pain in his rectum.  He takes tramadol for that at home which is usually effective.  **Interim History Continues to have some abdominal discomfort and pain and felt nauseous her medications have been adjusted and added. Surgery, medical oncology palliative care has been consulted for further care and now the recommendation is for a palliative venting G-tube however unfortunately cannot be done until Monday, 11/02/2023.  Assessment and Plan:  SBO  -The surgeon has recommended an NG tube but was declined at the time of admission but then decided to have it placed early this morning.  Is now connected to intermittent suction and he has copious bilious fluid noted and had at least 2 L output yesterday and they may consider a clamping trial liquid diet for next few days but is likely the obstruction will likely recur and he will benefit from a venting gastrostomy tube -He has had a little bit of gas output in his colostomy bag and the  abdominal discomfort still persistent so we will continue with antiemetics and pain medicine skin -Surgery is evaluated and requesting and recommend a palliative venting gastrostomy tube placement with bowel rest to see if this improves his bowel obstruction; -Continues to have significant abdominal discomfort and pain -Currently getting Gastrografin -General Surgery is recommending considering IR consult to see if they will place venting palliative gastrostomy tube to see if this will be less invasive than surgical gastrostomy; IR consulted and was concerned for risk of peritonitis with g tube given the ascites but this was discussed this with patient and he would like to proceed with g tube. IR willing but unable to do until Monday 11/02/23 -Continue n.p.o. and continue with fluid hydration with LR at 75 mL/h for 1 day as well as antiemetics with ondansetron 4 mg IV every 6 as needed for nausea vomiting and prochlorperazine 10 mg every 6.  For refractory nausea vomiting -Continue with famotidine 20 mg IV every 24 and pantoprazole 40 mg every 24 -Continue with IV lorazepam for anxiety and baclofen for singultus -KUB done yesterday and showed "Similar degree of small-bowel dilatation within the central abdomen suggestive of ongoing small-bowel obstruction." -General Surgery recommending continuing NG tube and bowel rest given that the patient continues to have high output in his NG   Metastatic Rectal Cancer with Peritoneal and Abdominal Carcinomatosis  -Status post chemotherapy with FOLFOX x 7 cycles and cycle 3 was held due  to toxicity and he is also status post radiation therapy plus Xeloda in 2023 -Has an ileostomy and has had chronic high output but now does not have urinary output. -Hospice referral had been made prior to this SBO.   -Consulted Oncology Dr [name] and palliative care in the am. -He was getting IV fluids 2-3 times a week as an outpatient at the cancer center.  Will go ahead and  order IV fluids for now and changed to IVF with LR at 75 mL/hr and will continue for today -Has decided to forego any further chemotherapy -Palliative consulted for goals of care discussion while he is hospitalized but apparently the patient has a referral to [facility] at home in outpatient setting -Medical oncology to discuss hospice care with the patient as well and patient is agreeable to hospice services and this is going to be arranged -Medical oncology has added sublingual morphine concentrate 10 to 20 mg every 4 as needed for pain and have increased his hydromorphone dose to 0.5-1 mL grams every 3 hours as needed for moderate pain -Current plan is for a palliative venting G-tube with interventional radiology tomorrow on 11/02/2023  CKD Stage 3a -BUN/Cr Trend: Recent Labs  Lab 10/23/23 1115 10/28/23 1644 10/29/23 1314 10/30/23 0348 10/31/23 0318 11/01/23 0236  BUN 17 13 14 17 19 21   CREATININE 1.67* 1.34* 1.52* 1.65* 1.60* 1.22  -Resumed IVF with LR at 75 mL/hr x 12 hours -Avoid Nephrotoxic Medications, Contrast Dyes, Hypotension and Dehydration to Ensure Adequate Renal Perfusion and will need to Renally Adjust Meds -Continue to Monitor and Trend Renal Function carefully and repeat CMP in the AM   Hyponatremia, mild and fluctuating  -Na+ Trend: Recent Labs  Lab 10/23/23 1115 10/28/23 1644 10/29/23 1314 10/30/23 0348 10/31/23 0318 11/01/23 0236  NA 138 133* 135 134* 133* 136  -Continue to Monitor and Trend and repeat CMP in the AM  GERD/GI Prophylaxis -Continue with Pantoprazole 40 mg IV every 24 and Famotidine 20 g IV every 24h -Added Maalox/Mylanta in between when NG is clamped   Hypoalbuminemia -Patient's Albumin Trend: Recent Labs  Lab 10/23/23 1115 10/28/23 1644 10/29/23 1314 10/30/23 0348 10/31/23 0318 11/01/23 0236  ALBUMIN 4.1 3.6 3.7 3.5 3.4* 3.5  -Continue to Monitor and Trend and repeat CMP in the AM  Overweight -Complicates overall  prognosis and care -Estimated body mass index is 27.67 kg/m as calculated from the following:   Height as of this encounter: 5\' 11"  (1.803 m).   Weight as of this encounter: 90 kg.  -Weight Loss and Dietary Counseling given  Tobacco Abuse -Smoking cessation counseling given the patient is still smoking   DVT prophylaxis: None    Code Status: Do not attempt resuscitation (DNR) - Comfort care Family Communication: No family present at bedside  Disposition Plan:  Level of care: Med-Surg Status is: Inpatient Remains inpatient appropriate because: Awaiting palliative venting G-tube placement   Consultants:  Palliative Care Medical Oncology General Surgery Interventional Radiology  Procedures:  As delineated as above  Antimicrobials:  Anti-infectives (From admission, onward)    None       Subjective: Seen and examined at bedside and appeared a bit more comfortable today.  No nausea vomiting.  Having a little bit more output in his ostomy today.  Continues to have significant amount of output in his NG tube.  No other concerns or complaints at this time.  Objective: Vitals:   10/31/23 0508 10/31/23 1345 10/31/23 2114 11/01/23 0554  BP:  134/86 133/87 134/86 130/84  Pulse: 79 81 74 71  Resp: 16 19 18 16   Temp: 98.3 F (36.8 C) 97.6 F (36.4 C) 98.2 F (36.8 C) 97.6 F (36.4 C)  TempSrc: Oral Oral Oral Oral  SpO2: 97%  97% 97%  Weight:      Height:        Intake/Output Summary (Last 24 hours) at 11/01/2023 1217 Last data filed at 11/01/2023 1000 Gross per 24 hour  Intake 2065.54 ml  Output 2160 ml  Net -94.46 ml   Filed Weights   10/28/23 1613  Weight: 90 kg   Examination: Physical Exam:  Constitutional: Overweight chronically ill-appearing Caucasian male who appears little bit more comfortable Respiratory: Diminished to auscultation bilaterally with some coarse breath sounds, no wheezing, rales, rhonchi or crackles. Normal respiratory effort and patient  is not tachypenic. No accessory muscle use.  Cardiovascular: RRR, no murmurs / rubs / gallops. S1 and S2 auscultated. No extremity edema  Abdomen: Soft, a little tender to palpate.  Has an ileostomy with some output. No masses palpated. Bowel sounds are slightly diminished.  GU: Deferred. Musculoskeletal: No clubbing / cyanosis of digits/nails. No joint deformity upper and lower extremities.  Skin: No rashes, lesions, ulcers on a limited skin evaluation. No induration; Warm and dry.  Neurologic: CN 2-12 grossly intact with no focal deficits. Romberg sign and cerebellar reflexes not assessed.  Psychiatric: Normal judgment and insight. Alert and oriented x 3. Normal mood and appropriate affect.   Data Reviewed: I have personally reviewed following labs and imaging studies  CBC: Recent Labs  Lab 10/28/23 1644 10/29/23 1314 10/30/23 0348 10/31/23 0318 11/01/23 0236  WBC 8.8 10.3 7.3 6.8 6.8  NEUTROABS  --  9.1* 5.7 5.4 5.3  HGB 14.9 15.2 14.0 13.2 13.2  HCT 45.9 46.2 44.1 40.8 41.8  MCV 85.3 84.6 85.3 85.4 86.2  PLT 226 250 216 197 217   Basic Metabolic Panel: Recent Labs  Lab 10/28/23 1644 10/29/23 1314 10/30/23 0348 10/31/23 0318 11/01/23 0236  NA 133* 135 134* 133* 136  K 3.8 5.1 3.9 3.6 3.5  CL 98 100 98 96* 99  CO2 24 22 28 26 28   GLUCOSE 131* 235* 93 86 79  BUN 13 14 17 19 21   CREATININE 1.34* 1.52* 1.65* 1.60* 1.22  CALCIUM 9.2 9.5 9.4 8.3* 8.6*  MG  --  1.8 1.8 2.2 2.4  PHOS  --  2.7 3.3 2.9 2.8   GFR: Estimated Creatinine Clearance: 66.9 mL/min (by C-G formula based on SCr of 1.22 mg/dL). Liver Function Tests: Recent Labs  Lab 10/28/23 1644 10/29/23 1314 10/30/23 0348 10/31/23 0318 11/01/23 0236  AST 23 25 24 25 29   ALT 17 16 17 17 18   ALKPHOS 151* 158* 136* 121 128*  BILITOT 0.9 0.7 1.1 1.1 1.4*  PROT 7.0 7.1 6.7 6.6 6.7  ALBUMIN 3.6 3.7 3.5 3.4* 3.5   Recent Labs  Lab 10/28/23 1644  LIPASE 24   No results for input(s): "AMMONIA" in the last  168 hours. Coagulation Profile: No results for input(s): "INR", "PROTIME" in the last 168 hours. Cardiac Enzymes: No results for input(s): "CKTOTAL", "CKMB", "CKMBINDEX", "TROPONINI" in the last 168 hours. BNP (last 3 results) No results for input(s): "PROBNP" in the last 8760 hours. HbA1C: No results for input(s): "HGBA1C" in the last 72 hours. CBG: No results for input(s): "GLUCAP" in the last 168 hours. Lipid Profile: No results for input(s): "CHOL", "HDL", "LDLCALC", "TRIG", "CHOLHDL", "LDLDIRECT" in the last  72 hours. Thyroid Function Tests: No results for input(s): "TSH", "T4TOTAL", "FREET4", "T3FREE", "THYROIDAB" in the last 72 hours. Anemia Panel: No results for input(s): "VITAMINB12", "FOLATE", "FERRITIN", "TIBC", "IRON", "RETICCTPCT" in the last 72 hours. Sepsis Labs: No results for input(s): "PROCALCITON", "LATICACIDVEN" in the last 168 hours.  No results found for this or any previous visit (from the past 240 hours).   Radiology Studies: DG Abd 1 View Result Date: 10/31/2023 CLINICAL DATA:  [id number] Encounter for feeding tube placement [id number] EXAM: ABDOMEN - 1 VIEW COMPARISON:  10/30/2023 FINDINGS: Limited radiograph of the lower chest and upper abdomen was obtained for the purposes of enteric tube localization. Enteric tube is seen coursing below the diaphragm with distal tipterminating within the expected location of the gastric body. IMPRESSION: Enteric tube terminates within the expected location of the proximal gastric body. Electronically Signed   By: Duanne Guess D.O.   On: 10/31/2023 16:27   Scheduled Meds:  Chlorhexidine Gluconate Cloth  6 each Topical Daily   lidocaine  1 Application Other Once   pantoprazole (PROTONIX) IV  40 mg Intravenous Q24H   sodium chloride flush  10-40 mL Intracatheter Q12H   Continuous Infusions:  famotidine (PEPCID) IV 20 mg (10/31/23 1709)   lactated ringers 75 mL/hr at 11/01/23 0317    LOS: 4 days   Marguerita Merles, DO [facility] Available via Epic secure chat 7am-7pm After these hours, please refer to coverage provider listed on [email or web address] 11/01/2023, 12:17 PM

## 2023-11-01 NOTE — Plan of Care (Signed)
  Problem: Education: Goal: Knowledge of General Education information will improve Description: Including pain rating scale, medication(s)/side effects and non-pharmacologic comfort measures Outcome: Progressing   Problem: Activity: Goal: Risk for activity intolerance will decrease Outcome: Progressing   

## 2023-11-01 NOTE — Progress Notes (Signed)
   Subjective/Chief Complaint: Sleeping this am, high ng output   Objective: Vital signs in last 24 hours: Temp:  [97.6 F (36.4 C)-98.2 F (36.8 C)] 97.6 F (36.4 C) (12/15 0554) Pulse Rate:  [71-81] 71 (12/15 0554) Resp:  [16-19] 16 (12/15 0554) BP: (130-134)/(84-87) 130/84 (12/15 0554) SpO2:  [97 %] 97 % (12/15 0554) Last BM Date : 10/29/23  Intake/Output from previous day: 12/14 0701 - 12/15 0700 In: 1511.8 [P.O.:60; I.V.:1421.8; NG/GT:30] Out: 1960 [Urine:175; Emesis/NG output:1665; Stool:120] Intake/Output this shift: No intake/output data recorded.    Lab Results:  Recent Labs    10/31/23 0318 11/01/23 0236  WBC 6.8 6.8  HGB 13.2 13.2  HCT 40.8 41.8  PLT 197 217   BMET Recent Labs    10/31/23 0318 11/01/23 0236  NA 133* 136  K 3.6 3.5  CL 96* 99  CO2 26 28  GLUCOSE 86 79  BUN 19 21  CREATININE 1.60* 1.22  CALCIUM 8.3* 8.6*   PT/INR No results for input(s): "LABPROT", "INR" in the last 72 hours. ABG No results for input(s): "PHART", "HCO3" in the last 72 hours.  Invalid input(s): "PCO2", "PO2"  Studies/Results: DG Abd 1 View Result Date: 10/31/2023 CLINICAL DATA:  [id number] Encounter for feeding tube placement [id number] EXAM: ABDOMEN - 1 VIEW COMPARISON:  10/30/2023 FINDINGS: Limited radiograph of the lower chest and upper abdomen was obtained for the purposes of enteric tube localization. Enteric tube is seen coursing below the diaphragm with distal tipterminating within the expected location of the gastric body. IMPRESSION: Enteric tube terminates within the expected location of the proximal gastric body. Electronically Signed   By: Duanne Guess D.O.   On: 10/31/2023 16:27   DG Abd Portable 1V Result Date: 10/30/2023 CLINICAL DATA:  [id number] SBO (small bowel obstruction) (HCC) [id number] EXAM: PORTABLE ABDOMEN - 1 VIEW COMPARISON:  10/29/2023 FINDINGS: Enteric tube projects within the proximal stomach. Similar degree of small-bowel dilatation within  the central abdomen. No enteric contrast is evident, which may be dilute. No gross free intraperitoneal air. IMPRESSION: Similar degree of small-bowel dilatation within the central abdomen suggestive of ongoing small-bowel obstruction. Electronically Signed   By: Duanne Guess D.O.   On: 10/30/2023 13:55    Anti-infectives: Anti-infectives (From admission, onward)    None       Assessment/Plan: Metastatic rectal cancer with carcinomatosis  SBO - high output ng -did not wake him this am -plan for g tube hopefully tomorrow with IR   FEN: NPO, NGT to LIWS, IVF per primary  VTE: ok to have LMWH or SQH from surgery standpoint - hold for IR ID: none    Kenneth [name] 11/01/2023

## 2023-11-02 ENCOUNTER — Other Ambulatory Visit: Payer: Self-pay | Admitting: *Deleted

## 2023-11-02 ENCOUNTER — Encounter: Payer: Self-pay | Admitting: Oncology

## 2023-11-02 ENCOUNTER — Other Ambulatory Visit (HOSPITAL_COMMUNITY): Payer: Self-pay

## 2023-11-02 DIAGNOSIS — Z515 Encounter for palliative care: Secondary | ICD-10-CM

## 2023-11-02 DIAGNOSIS — K56609 Unspecified intestinal obstruction, unspecified as to partial versus complete obstruction: Secondary | ICD-10-CM | POA: Diagnosis not present

## 2023-11-02 LAB — COMPREHENSIVE METABOLIC PANEL
ALT: 21 U/L (ref 0–44)
AST: 38 U/L (ref 15–41)
Albumin: 3.7 g/dL (ref 3.5–5.0)
Alkaline Phosphatase: 137 U/L — ABNORMAL HIGH (ref 38–126)
Anion gap: 9 (ref 5–15)
BUN: 20 mg/dL (ref 8–23)
CO2: 27 mmol/L (ref 22–32)
Calcium: 8.8 mg/dL — ABNORMAL LOW (ref 8.9–10.3)
Chloride: 99 mmol/L (ref 98–111)
Creatinine, Ser: 1.47 mg/dL — ABNORMAL HIGH (ref 0.61–1.24)
GFR, Estimated: 54 mL/min — ABNORMAL LOW (ref >60)
Glucose, Bld: 86 mg/dL (ref 70–99)
Potassium: 4 mmol/L (ref 3.5–5.1)
Sodium: 135 mmol/L (ref 135–145)
Total Bilirubin: 1.3 mg/dL — ABNORMAL HIGH (ref <1.2)
Total Protein: 7.2 g/dL (ref 6.5–8.1)

## 2023-11-02 LAB — MAGNESIUM: Magnesium: 2.5 mg/dL — ABNORMAL HIGH (ref 1.7–2.4)

## 2023-11-02 LAB — CBC WITH DIFFERENTIAL/PLATELET
Abs Immature Granulocytes: 0.02 10*3/uL (ref 0.00–0.07)
Basophils Absolute: 0.1 10*3/uL (ref 0.0–0.1)
Basophils Relative: 1 %
Eosinophils Absolute: 0.2 10*3/uL (ref 0.0–0.5)
Eosinophils Relative: 2 %
HCT: 46 % (ref 39.0–52.0)
Hemoglobin: 14.5 g/dL (ref 13.0–17.0)
Immature Granulocytes: 0 %
Lymphocytes Relative: 12 %
Lymphs Abs: 1 10*3/uL (ref 0.7–4.0)
MCH: 27.4 pg (ref 26.0–34.0)
MCHC: 31.5 g/dL (ref 30.0–36.0)
MCV: 86.8 fL (ref 80.0–100.0)
Monocytes Absolute: 0.6 10*3/uL (ref 0.1–1.0)
Monocytes Relative: 8 %
Neutro Abs: 6.2 10*3/uL (ref 1.7–7.7)
Neutrophils Relative %: 77 %
Platelets: 250 10*3/uL (ref 150–400)
RBC: 5.3 MIL/uL (ref 4.22–5.81)
RDW: 13.6 % (ref 11.5–15.5)
WBC: 8 10*3/uL (ref 4.0–10.5)
nRBC: 0 % (ref 0.0–0.2)

## 2023-11-02 LAB — PHOSPHORUS: Phosphorus: 2.7 mg/dL (ref 2.5–4.6)

## 2023-11-02 LAB — PROTIME-INR
INR: 1.2 (ref 0.8–1.2)
Prothrombin Time: 15.6 s — ABNORMAL HIGH (ref 11.4–15.2)

## 2023-11-02 MED ORDER — LORAZEPAM 0.5 MG PO TABS: 0.5000 mg | ORAL_TABLET | ORAL | 0 refills | Status: DC | PRN

## 2023-11-02 MED ORDER — TRAMADOL HCL 50 MG PO TABS: 50.0000 mg | ORAL_TABLET | Freq: Four times a day (QID) | ORAL | Status: DC | PRN

## 2023-11-02 MED ORDER — MORPHINE SULFATE (CONCENTRATE) 10 MG /0.5 ML PO SOLN
10.0000 mg | ORAL | 0 refills | Status: DC | PRN
  Filled 2023-11-02: qty 30, 7d supply, fill #0

## 2023-11-02 MED ORDER — DEXTROSE-SODIUM CHLORIDE 5-0.45 % IV SOLN: INTRAVENOUS | Status: DC

## 2023-11-02 MED ORDER — ONDANSETRON 8 MG PO TBDP
8.0000 mg | ORAL_TABLET | Freq: Three times a day (TID) | ORAL | 0 refills | Status: DC | PRN
  Filled 2023-11-02: qty 30, 10d supply, fill #0

## 2023-11-02 NOTE — Progress Notes (Signed)
Daily Progress Note   Patient Name: Kenneth [name]       Date: 11/02/2023 DOB: 11-01-1961  Age: 62 y.o. MRN#: [id number] Attending Physician: Osvaldo Shipper, MD Primary Care Physician: [name], MD Admit Date: 10/28/2023 Length of Stay: 5 days  Reason for Consultation/Follow-up: Establishing goals of care  HPI/Patient Profile:  62 y.o. male  with past medical history of terminal rectal cancer per wife's report. He has malignant ascites, omental caking, and symptomatic rectal thickening and a hospice referral was placed last week. He presented to the ER for abdominal pain, gas, reflux and no BM in some time. Oncology recommended coming to the ER. He was admitted on 10/28/2023 with malignant SBO, metastatic rectal cancer with peritoneal and abdominal carcinomatosis, and others.    Palliative medicine was consulted for GOC conversations.  Subjective:   Subjective: I saw and examined Mr. Duggal and met with him and his brother at the bedside.  He reports that his symptoms have been fairly well-controlled since having NG tube in place.  Still has occasional pain that responds well to pain medication.  No vomiting since having NG tube placed.  We discussed plan for upcoming G-tube on Monday.  He had questions regarding intake with G-tube in place and we discussed selection of intake that would be able to pass through to preventing her necessary.  We talked about his understanding of everything going on and how he is processing his mortality.  Per his request, we reviewed likely prognosis and how this would be tied closely to nutrition and functional status as indicators of his overall disease trajectory.  We discussed plan to transition home with hospice support and support they will provide in the home. Provided emotional and general support . I answered all questions and addressed all concerns to the best of my ability.  Review of Systems  Respiratory:  Negative for chest tightness  and shortness of breath.   Cardiovascular:  Negative for chest pain.  Gastrointestinal:  Positive for abdominal pain (Improved). Negative for nausea and vomiting.    Objective:   Vital Signs:  BP (!) 142/90 (BP Location: Left Arm)   Pulse 63   Temp 97.6 F (36.4 C) (Oral)   Resp 18   Ht 5\' 11"  (1.803 m)   Wt 90 kg   SpO2 98%   BMI 27.67 kg/m   Physical Exam Vitals and nursing note reviewed.  Constitutional:      General: He is not in acute distress.    Appearance: He is ill-appearing.  HENT:     Head: Normocephalic and atraumatic.  Cardiovascular:     Rate and Rhythm: Normal rate.  Pulmonary:     Effort: Pulmonary effort is normal.  Abdominal:     General: Abdomen is flat.  Skin:    General: Skin is warm and dry.  Neurological:     General: No focal deficit present.     Mental Status: He is alert.  Psychiatric:        Mood and Affect: Mood normal.        Behavior: Behavior normal.     Palliative Assessment/Data: 50%    Existing Vynca/ACP Documentation: Advance directive signed 01/26/2006   Assessment & Plan:   Impression: Present on Admission:  Rectal cancer (HCC)  Peritoneal carcinomatosis (HCC)  Drug-induced polyneuropathy (HCC)  Stage 3a chronic kidney disease (HCC)  SBO (small bowel obstruction) (HCC)  62 year old male with acute presentation of chronic comorbidities as described above.  The patient  has terminal rectal cancer and now presents with malignant SBO which complicates his picture further.  He understands this time is very limited.  He is electing for a venting G-tube for palliative/comfort purposes.  He is also electing to engage with hospice for home hospice at discharge.  Plans are for IR placement of venting G-tube on Monday.  Remain in house for symptom management until that time.  [facility] collective is already on board.  Long-term prognosis grave.  SUMMARY OF RECOMMENDATIONS   Remain DNR-comfort Plan for G-tube placement on Monday  followed by discharge home.  He is hopeful to be discharged home Monday. Continue symptom management and current scope of treatment for now Palliative medicine will continue to follow  Symptom Management:  Per oncology team Palliative medicine is available to assist if needed  Code Status: DNR-comfort  Prognosis: < 4 weeks  Discharge Planning: Home with Hospice  Discussed with: Patient, family, medical team, nursing team  Thank you for allowing [place] to participate in the care of [name] PMT will continue to support holistically.  Time Total: 40 min  Detailed review of medical records (labs, imaging, vital signs), medically appropriate exam, discussed with treatment team, counseling and education to patient, family, & staff, documenting clinical information, medication management, coordination of care Romie Minus, MD [facility] Palliative Medicine Team [phone]

## 2023-11-02 NOTE — Progress Notes (Signed)
Script for Ativan sent to [facility] per Dr. [name] direction. Scheduling message sent for telephone visit on 12/19 at 3:30 pm.

## 2023-11-02 NOTE — Progress Notes (Signed)
[facility] PROGRESS NOTE   Kenneth [name] ZOX:[id number] DOB: 08-Jan-1961 DOA: 10/28/2023  PCP: [name], MD  Brief History: This is a 62 year old male with a past medical history of rectal cancer with metastases including peritoneal carcinomatosis.  Presented with abdominal pain.  Noted to have evidence for small bowel obstruction.  He was hospitalized for further management.  Consultants: Medical oncology.  Palliative care.  General surgery.  Interventional radiology.  Procedures: None yet    Subjective/Interval History: Patient denies any pain currently.  Is waiting on his palliative G-tube placement.  Wife is at the bedside.   Assessment/Plan:  Small bowel obstruction Currently has an NG tube.  Seen by general surgery palliative care and medical oncology.  IR was consulted to see if they could place G-tube for venting purposes which will be for palliative purposes. Informed this morning by general surgery that IR thinks that this would be a very high risk procedure due to his ascites and he will be at very high risk for bleeding complications. So they would not be doing this procedure. Will wait and see what discussions occurred between patient and medical oncology and palliative care.  Unclear if the patient can be sent home with the NG tube in place.  Metastatic rectal cancer with peritoneal carcinomatosis He is status post chemotherapy and radiation treatments.  Seen by medical oncology.  Discussions were held with by oncology as well as palliative care.  He is now being transitioned to hospice.  Plan was for home with hospice after a venting G-tube was placed but it looks like this is not going to be possible anymore.  Will need to determine next steps.  Chronic kidney disease stage IIIa Stable.  Hyponatremia Stable.  DVT Prophylaxis: SCDs Code Status: DNR Family Communication: Discussed with patient and his wife Disposition Plan: Home with hospice  when ready for discharge      Medications: Scheduled:  Chlorhexidine Gluconate Cloth  6 each Topical Daily   lidocaine  1 Application Other Once   pantoprazole (PROTONIX) IV  40 mg Intravenous Q24H   sodium chloride flush  10-40 mL Intracatheter Q12H   Continuous:  famotidine (PEPCID) IV 20 mg (11/01/23 1652)   WJX:BJYNWGNFA, alum & mag hydroxide-simeth, baclofen, HYDROmorphone (DILAUDID) injection, LORazepam, morphine CONCENTRATE, ondansetron (ZOFRAN) IV, phenol, prochlorperazine, sodium chloride flush, traMADol  Antibiotics: Anti-infectives (From admission, onward)    None       Objective:  Vital Signs  Vitals:   11/01/23 0554 11/01/23 1356 11/01/23 2127 11/02/23 0634  BP: 130/84 (!) 151/89 (!) 155/89 (!) 142/90  Pulse: 71 77 77 63  Resp: 16 16 18 18   Temp: 97.6 F (36.4 C) 97.9 F (36.6 C) 98.1 F (36.7 C) 97.6 F (36.4 C)  TempSrc: Oral Oral Oral Oral  SpO2: 97% 100% 97% 98%  Weight:      Height:        Intake/Output Summary (Last 24 hours) at 11/02/2023 1050 Last data filed at 11/02/2023 1000 Gross per 24 hour  Intake 1462.33 ml  Output 1725 ml  Net -262.67 ml   Filed Weights   10/28/23 1613  Weight: 90 kg    General appearance: Awake alert.  In no distress Resp: Clear to auscultation bilaterally.  Normal effort Cardio: S1-S2 is normal regular.  No S3-S4.  No rubs murmurs or bruit GI: Abdomen is soft.  Nontender nondistended.  Bowel sounds are present normal.  No masses organomegaly Extremities: No edema.  Full range of motion  of lower extremities. Neurologic: Alert and oriented x3.  No focal neurological deficits.    Lab Results:  Data Reviewed: I have personally reviewed following labs and reports of the imaging studies  CBC: Recent Labs  Lab 10/29/23 1314 10/30/23 0348 10/31/23 0318 11/01/23 0236 11/02/23 0252  WBC 10.3 7.3 6.8 6.8 8.0  NEUTROABS 9.1* 5.7 5.4 5.3 6.2  HGB 15.2 14.0 13.2 13.2 14.5  HCT 46.2 44.1 40.8 41.8 46.0   MCV 84.6 85.3 85.4 86.2 86.8  PLT 250 216 197 217 250    Basic Metabolic Panel: Recent Labs  Lab 10/29/23 1314 10/30/23 0348 10/31/23 0318 11/01/23 0236 11/02/23 0252  NA 135 134* 133* 136 135  K 5.1 3.9 3.6 3.5 4.0  CL 100 98 96* 99 99  CO2 22 28 26 28 27   GLUCOSE 235* 93 86 79 86  BUN 14 17 19 21 20   CREATININE 1.52* 1.65* 1.60* 1.22 1.47*  CALCIUM 9.5 9.4 8.3* 8.6* 8.8*  MG 1.8 1.8 2.2 2.4 2.5*  PHOS 2.7 3.3 2.9 2.8 2.7    GFR: Estimated Creatinine Clearance: 55.5 mL/min (A) (by C-G formula based on SCr of 1.47 mg/dL (H)).  Liver Function Tests: Recent Labs  Lab 10/29/23 1314 10/30/23 0348 10/31/23 0318 11/01/23 0236 11/02/23 0252  AST 25 24 25 29  38  ALT 16 17 17 18 21   ALKPHOS 158* 136* 121 128* 137*  BILITOT 0.7 1.1 1.1 1.4* 1.3*  PROT 7.1 6.7 6.6 6.7 7.2  ALBUMIN 3.7 3.5 3.4* 3.5 3.7    Recent Labs  Lab 10/28/23 1644  LIPASE 24   Coagulation Profile: Recent Labs  Lab 11/02/23 0252  INR 1.2     Radiology Studies: DG Abd 1 View Result Date: 10/31/2023 CLINICAL DATA:  [id number] Encounter for feeding tube placement [id number] EXAM: ABDOMEN - 1 VIEW COMPARISON:  10/30/2023 FINDINGS: Limited radiograph of the lower chest and upper abdomen was obtained for the purposes of enteric tube localization. Enteric tube is seen coursing below the diaphragm with distal tipterminating within the expected location of the gastric body. IMPRESSION: Enteric tube terminates within the expected location of the proximal gastric body. Electronically Signed   By: Duanne Guess D.O.   On: 10/31/2023 16:27       LOS: 5 days   Tres Grzywacz Rito Ehrlich  [facility] Pager on [email or web address]  11/02/2023, 10:50 AM

## 2023-11-02 NOTE — Discharge Summary (Signed)
[facility]  Physician Discharge Summary   Patient ID: EULAS PLASENCIA MRN: [id number] DOB/AGE: [birth year] 62 y.o.  Admit date: 10/28/2023 Discharge date: 11/02/2023    PCP: [name], MD  DISCHARGE DIAGNOSES:    SBO (small bowel obstruction) (HCC)   Rectal cancer (HCC)   Stage 3a chronic kidney disease (HCC)   Drug-induced polyneuropathy (HCC)   Peritoneal carcinomatosis (HCC)   RECOMMENDATIONS FOR OUTPATIENT FOLLOW UP: Hospice to follow patient at home   Home Health: None Equipment/Devices: None  CODE STATUS: DNR  DISCHARGE CONDITION: fair  Diet recommendation: Comfort feeds  INITIAL HISTORY: This is a 62 year old male with a past medical history of rectal cancer with metastases including peritoneal carcinomatosis.  Presented with abdominal pain.  Noted to have evidence for small bowel obstruction.  He was hospitalized for further management.   Consultants: Medical oncology.  Palliative care.  General surgery.  Interventional radiology.   Procedures: None  HOSPITAL COURSE:   Small bowel obstruction secondary to peritoneal carcinomatosis Currently has an NG tube.  Seen by general surgery palliative care and medical oncology.  IR was consulted to see if they could place G-tube for venting purposes which will be for palliative purposes. Informed this morning by general surgery that IR thinks that this would be a very high risk procedure due to his ascites and he will be at very high risk for bleeding complications. So they would not be doing this procedure. Above was discussed with patient.  He understands the situation and wants to go home.  He wants to remove the NG tube.  He understands that he may experience nausea and vomiting and worsening pain.  He has reconciled with his diagnosis and would like to go home to be comfortable under hospice care.   Metastatic rectal cancer with peritoneal carcinomatosis   Chronic kidney disease stage IIIa    Hyponatremia  Okay for discharge home today.   PERTINENT LABS:  The results of significant diagnostics from this hospitalization (including imaging, microbiology, ancillary and laboratory) are listed below for reference.     Labs:   Basic Metabolic Panel: Recent Labs  Lab 10/29/23 1314 10/30/23 0348 10/31/23 0318 11/01/23 0236 11/02/23 0252  NA 135 134* 133* 136 135  K 5.1 3.9 3.6 3.5 4.0  CL 100 98 96* 99 99  CO2 22 28 26 28 27   GLUCOSE 235* 93 86 79 86  BUN 14 17 19 21 20   CREATININE 1.52* 1.65* 1.60* 1.22 1.47*  CALCIUM 9.5 9.4 8.3* 8.6* 8.8*  MG 1.8 1.8 2.2 2.4 2.5*  PHOS 2.7 3.3 2.9 2.8 2.7   Liver Function Tests: Recent Labs  Lab 10/29/23 1314 10/30/23 0348 10/31/23 0318 11/01/23 0236 11/02/23 0252  AST 25 24 25 29  38  ALT 16 17 17 18 21   ALKPHOS 158* 136* 121 128* 137*  BILITOT 0.7 1.1 1.1 1.4* 1.3*  PROT 7.1 6.7 6.6 6.7 7.2  ALBUMIN 3.7 3.5 3.4* 3.5 3.7   Recent Labs  Lab 10/28/23 1644  LIPASE 24    CBC: Recent Labs  Lab 10/29/23 1314 10/30/23 0348 10/31/23 0318 11/01/23 0236 11/02/23 0252  WBC 10.3 7.3 6.8 6.8 8.0  NEUTROABS 9.1* 5.7 5.4 5.3 6.2  HGB 15.2 14.0 13.2 13.2 14.5  HCT 46.2 44.1 40.8 41.8 46.0  MCV 84.6 85.3 85.4 86.2 86.8  PLT 250 216 197 217 250     IMAGING STUDIES DG Abd 1 View Result Date: 10/31/2023 CLINICAL DATA:  [id number] Encounter for feeding tube  placement [id number] EXAM: ABDOMEN - 1 VIEW COMPARISON:  10/30/2023 FINDINGS: Limited radiograph of the lower chest and upper abdomen was obtained for the purposes of enteric tube localization. Enteric tube is seen coursing below the diaphragm with distal tipterminating within the expected location of the gastric body. IMPRESSION: Enteric tube terminates within the expected location of the proximal gastric body. Electronically Signed   By: Duanne Guess D.O.   On: 10/31/2023 16:27   DG Abd Portable 1V Result Date: 10/30/2023 CLINICAL DATA:  [id number] SBO (small bowel  obstruction) (HCC) [id number] EXAM: PORTABLE ABDOMEN - 1 VIEW COMPARISON:  10/29/2023 FINDINGS: Enteric tube projects within the proximal stomach. Similar degree of small-bowel dilatation within the central abdomen. No enteric contrast is evident, which may be dilute. No gross free intraperitoneal air. IMPRESSION: Similar degree of small-bowel dilatation within the central abdomen suggestive of ongoing small-bowel obstruction. Electronically Signed   By: Duanne Guess D.O.   On: 10/30/2023 13:55   DG Abd Portable 1V-Small Bowel Obstruction Protocol-initial, 8 hr delay Result Date: 10/29/2023 CLINICAL DATA:  Small-bowel obstruction EXAM: PORTABLE ABDOMEN - 1 VIEW COMPARISON:  10/29/2023, 10/28/2023 FINDINGS: Single frontal view of the abdomen and pelvis was obtained, excluding the lower pelvis by collimation. Enteric catheter tip and side port project over the gastric fundus. Stable mild gaseous distention of the small bowel consistent with obstruction. Imaging was performed 8 hours after oral contrast administration. Contrast is not identified on this exam, either due to dilution or interval passage. IMPRESSION: 1. Stable findings of small-bowel obstruction. 2. Oral contrast is not easily visualized on this exam, likely due to dilution within fluid-filled loops of bowel. Electronically Signed   By: Sharlet Salina M.D.   On: 10/29/2023 20:35   DG Abd 1 View Result Date: 10/29/2023 CLINICAL DATA:  NG tube placement EXAM: ABDOMEN - 1 VIEW limited for tube placement upright COMPARISON:  CT 10/28/2023 FINDINGS: Limited under penetrated x-ray demonstrates enteric tube in place with tip overlying the upper stomach but the side hole above the GE junction. Recommend this be advanced further several cm to reach the stomach. Nonspecific bowel gas pattern elsewhere in the midabdomen. Few distended small bowel loops identified as on prior CT scan IMPRESSION: Enteric tube in place. Side hole above the GE junction.  Recommend further advancement into the stomach Electronically Signed   By: Karen Kays M.D.   On: 10/29/2023 13:55   CT ABDOMEN PELVIS W CONTRAST Result Date: 10/28/2023 CLINICAL DATA:  Abdominal pain, constipation, history of rectal cancer EXAM: CT ABDOMEN AND PELVIS WITH CONTRAST TECHNIQUE: Multidetector CT imaging of the abdomen and pelvis was performed using the standard protocol following bolus administration of intravenous contrast. RADIATION DOSE REDUCTION: This exam was performed according to the departmental dose-optimization program which includes automated exposure control, adjustment of the mA and/or kV according to patient size and/or use of iterative reconstruction technique. CONTRAST:  OMNIPAQUE IOHEXOL 300 MG/ML  SOLN COMPARISON:  07/02/2023 FINDINGS: Lower chest: Trace left pleural effusion. Minimal left lower lobe atelectasis. Hepatobiliary: Subtle nodular contour of the liver capsule more pronounced on this contrast exam. Numerous subcentimeter hypodensities are seen throughout the liver parenchyma, not grossly changed since the 2023 study and most consistent with hepatic cysts. Given findings elsewhere within the abdomen and pelvis, metastatic disease would be difficult to exclude. Gallbladder is decompressed, with stable calcified gallstones. No evidence of acute cholecystitis. Pancreas: Unremarkable. No pancreatic ductal dilatation or surrounding inflammatory changes. Spleen: Stable borderline splenomegaly. No focal parenchymal abnormality. Adrenals/Urinary Tract:  There are multiple bilateral simple appearing renal cortical cysts. Stable complex hemorrhagic cyst lower pole left kidney image 43/2 measuring 1.6 cm. No specific imaging follow-up is required. No urinary tract calculi or obstructive uropathy. The adrenals and bladder are unremarkable. Stomach/Bowel: Since the previous staging exam, circumferential wall thickening of the rectum has progressed, measuring up to 15 mm in  thickness reference image 80/2. Multiple loops of dilated jejunum are seen within the central abdomen, measuring up to 3.7 cm in diameter, consistent with small-bowel obstruction. Transition point is seen within the distal jejunum within the lower central abdomen, compatible small bowel obstruction likely due to postsurgical adhesions or omental caking. Stable right upper quadrant enterostomy. Vascular/Lymphatic: No significant vascular findings. No discrete adenopathy within the abdomen or pelvis. Reproductive: Prostate is unremarkable. Other: Peritoneal carcinomatosis and omental caking again noted throughout the abdomen and pelvis, with interval development of moderate ascites. No free intraperitoneal gas. No abdominal wall hernia. Musculoskeletal: No acute or destructive bony abnormalities. Reconstructed images demonstrate no additional findings. IMPRESSION: 1. Findings consistent with progression of metastatic rectal cancer. Progressive annular mural thickening of the rectum consistent with worsening primary disease, with interval worsening of omental caking and development of significant malignant ascites. 2. Small bowel obstruction, transition point in the mid to distal jejunum likely as result of omental caking or postsurgical adhesions. 3. Cirrhotic morphology of the liver, with numerous subcentimeter hypodensities throughout the liver parenchyma compatible with cysts. Given findings elsewhere within the abdomen and pelvis, hepatic metastases would be difficult to exclude. 4. Cholelithiasis without evidence of cholecystitis. 5. Small left pleural effusion. Electronically Signed   By: Sharlet Salina M.D.   On: 10/28/2023 19:10    DISCHARGE EXAMINATION: See progress note from earlier today  DISPOSITION: Home  Discharge Instructions     Increase activity slowly   Complete by: As directed          Allergies as of 11/02/2023   No Known Allergies      Medication List     STOP taking  these medications    dicyclomine 10 MG capsule Commonly known as: BENTYL   diphenoxylate-atropine 2.5-0.025 MG tablet Commonly known as: LOMOTIL   ferrous sulfate 325 (65 FE) MG tablet   HYDROcodone-acetaminophen 5-325 MG tablet Commonly known as: Norco   lidocaine-prilocaine cream Commonly known as: EMLA   potassium chloride SA 20 MEQ tablet Commonly known as: KLOR-CON M   tadalafil 5 MG tablet Commonly known as: CIALIS   Tylenol PM Extra Strength 500-25 MG Tabs tablet Generic drug: diphenhydramine-acetaminophen   VITAMIN B12 PO   Vitamin D3 125 MCG (5000 UT) Caps       TAKE these medications    albuterol 108 (90 Base) MCG/ACT inhaler Commonly known as: Proventil HFA Inhale 2 puffs into the lungs every 6 (six) hours as needed for wheezing or shortness of breath.   lidocaine 5 % ointment Commonly known as: XYLOCAINE Apply 1 Application topically as needed (to numb the port site).   Melatonin Maximum Strength 5 MG Tabs Generic drug: melatonin Take 10 mg by mouth at bedtime.   morphine CONCENTRATE 10 mg / 0.5 ml concentrated solution Place 0.5 mLs (10 mg total) under the tongue every 4 (four) hours as needed for shortness of breath or severe pain (pain score 7-10).   ondansetron 8 MG disintegrating tablet Commonly known as: ZOFRAN-ODT Take 1 tablet (8 mg total) by mouth every 8 (eight) hours as needed for nausea or vomiting.   pantoprazole 20 MG tablet  Commonly known as: Protonix Take 1 tablet (20 mg total) by mouth daily. What changed: when to take this   traMADol 50 MG tablet Commonly known as: ULTRAM Take 1 tablet (50 mg total) by mouth every 6 (six) hours as needed for pain.          Follow-up Information     [facility] Follow up.   Specialty: Hospice and Palliative Medicine Why: [facility] will be following you for home hospice. Contact information: [address] [place] [address] [phone]                 TOTAL DISCHARGE TIME: 35 minutes  Jakyrah Holladay Rito Ehrlich  [facility] Pager on [email or web address]  11/03/2023, 11:32 AM

## 2023-11-02 NOTE — Progress Notes (Addendum)
Kenneth [name]   DOB:1961/06/30   XL#:[id number]      ASSESSMENT & PLAN:  Rectal cancer metastatic/abdominal carcinomatosis -Invasive moderate to poorly differentiated adenocarcinoma, diagnosed in March 2023 -Patient is status post FOLFOX x 7 cycles, cycle 8 was held. -He is also status post radiation therapy plus Xeloda in 2023. -Patient has an ileostomy with chronically high output. Patient decided against further chemotherapy and will be going home on hospice. -Dr. Corrado Hymon/Medical oncologist has been following closely  2.  Small bowel obstruction -+NGT training bilious fluid -Recommended venting G-tube prior to discharge however per IR and surgery unable.   -Okay from oncology point of view to discharge patient with NG tube in place -Recommend continuing Roxanol for pain and sublingual lorazepam for anxiety -Palliative following  3.  CKD -Elevated creatinine level -Avoid nephrotoxic meds -Monitor CMP   Code Status DNR-comfort  Goals of care: Incurable  Discharge planning: Hospice at home  Subjective:  Patient seen awake and alert laying in bed, frail appearing.  Denies nausea at this time, abdominal pain better.  NG tube intact and draining well.  Wife at bedside.  Reports they are hopeful for G-tube today prior to discharge on hospice.  Objective:  Vitals:   11/01/23 2127 11/02/23 0634  BP: (!) 155/89 (!) 142/90  Pulse: 77 63  Resp: 18 18  Temp: 98.1 F (36.7 C) 97.6 F (36.4 C)  SpO2: 97% 98%     Intake/Output Summary (Last 24 hours) at 11/02/2023 1141 Last data filed at 11/02/2023 1000 Gross per 24 hour  Intake 1462.33 ml  Output 1725 ml  Net -262.67 ml     REVIEW OF SYSTEMS:   Constitutional:+ Weak, denies fevers, chills or abnormal night sweats Eyes: Denies blurriness of vision, double vision or watery eyes Ears, nose, mouth, throat, and face: Denies mucositis or sore throat Respiratory: Denies cough, dyspnea or wheezes Cardiovascular: Denies  palpitation, chest discomfort or lower extremity swelling Gastrointestinal:  Denies nausea, heartburn or change in bowel habits Skin: Denies abnormal skin rashes Lymphatics: Denies new lymphadenopathy or easy bruising Neurological: Denies numbness, tingling or new weaknesses Behavioral/Psych: Mood is stable, no new changes  All other systems were reviewed with the patient and are negative.  PHYSICAL EXAMINATION: ECOG PERFORMANCE STATUS: 3 - Symptomatic, >50% confined to bed  Vitals:   11/01/23 2127 11/02/23 0634  BP: (!) 155/89 (!) 142/90  Pulse: 77 63  Resp: 18 18  Temp: 98.1 F (36.7 C) 97.6 F (36.4 C)  SpO2: 97% 98%   Filed Weights   10/28/23 1613  Weight: 198 lb 6.6 oz (90 kg)    GENERAL: + Weak appearing, +frail, alert, no distress and comfortable SKIN: skin color, texture, turgor are normal, no rashes or significant lesions EYES: normal, conjunctiva are pink and non-injected, sclera clear OROPHARYNX: +NGT, no exudate, no erythema and lips, buccal mucosa, and tongue normal  NECK: supple, thyroid normal size, non-tender, without nodularity LYMPH: no palpable lymphadenopathy in the cervical, axillary or inguinal LUNGS: clear to auscultation and percussion with normal breathing effort HEART: regular rate & rhythm and no murmurs and no lower extremity edema ABDOMEN: abdomen soft, non-tender and normal bowel sounds MUSCULOSKELETAL: no cyanosis of digits and no clubbing  PSYCH: alert & oriented x 3 with fluent speech NEURO: no focal motor/sensory deficits   All questions were answered. The patient knows to call the clinic with any problems, questions or concerns.   The total time spent in the appointment was 30 minutes encounter with patient  including review of chart and various tests results, discussions about plan of care and coordination of care plan  Dawson Bills, NP 11/02/2023 11:41 AM    Labs Reviewed:  Lab Results  Component Value Date   WBC 8.0 11/02/2023    HGB 14.5 11/02/2023   HCT 46.0 11/02/2023   MCV 86.8 11/02/2023   PLT 250 11/02/2023   Recent Labs    10/31/23 0318 11/01/23 0236 11/02/23 0252  NA 133* 136 135  K 3.6 3.5 4.0  CL 96* 99 99  CO2 26 28 27   GLUCOSE 86 79 86  BUN 19 21 20   CREATININE 1.60* 1.22 1.47*  CALCIUM 8.3* 8.6* 8.8*  GFRNONAA 48* >60 54*  PROT 6.6 6.7 7.2  ALBUMIN 3.4* 3.5 3.7  AST 25 29 38  ALT 17 18 21   ALKPHOS 121 128* 137*  BILITOT 1.1 1.4* 1.3*    Studies Reviewed:  DG Abd 1 View Result Date: 10/31/2023 CLINICAL DATA:  [id number] Encounter for feeding tube placement [id number] EXAM: ABDOMEN - 1 VIEW COMPARISON:  10/30/2023 FINDINGS: Limited radiograph of the lower chest and upper abdomen was obtained for the purposes of enteric tube localization. Enteric tube is seen coursing below the diaphragm with distal tipterminating within the expected location of the gastric body. IMPRESSION: Enteric tube terminates within the expected location of the proximal gastric body. Electronically Signed   By: Duanne Guess D.O.   On: 10/31/2023 16:27   DG Abd Portable 1V Result Date: 10/30/2023 CLINICAL DATA:  [id number] SBO (small bowel obstruction) (HCC) [id number] EXAM: PORTABLE ABDOMEN - 1 VIEW COMPARISON:  10/29/2023 FINDINGS: Enteric tube projects within the proximal stomach. Similar degree of small-bowel dilatation within the central abdomen. No enteric contrast is evident, which may be dilute. No gross free intraperitoneal air. IMPRESSION: Similar degree of small-bowel dilatation within the central abdomen suggestive of ongoing small-bowel obstruction. Electronically Signed   By: Duanne Guess D.O.   On: 10/30/2023 13:55   DG Abd Portable 1V-Small Bowel Obstruction Protocol-initial, 8 hr delay Result Date: 10/29/2023 CLINICAL DATA:  Small-bowel obstruction EXAM: PORTABLE ABDOMEN - 1 VIEW COMPARISON:  10/29/2023, 10/28/2023 FINDINGS: Single frontal view of the abdomen and pelvis was obtained, excluding the lower pelvis  by collimation. Enteric catheter tip and side port project over the gastric fundus. Stable mild gaseous distention of the small bowel consistent with obstruction. Imaging was performed 8 hours after oral contrast administration. Contrast is not identified on this exam, either due to dilution or interval passage. IMPRESSION: 1. Stable findings of small-bowel obstruction. 2. Oral contrast is not easily visualized on this exam, likely due to dilution within fluid-filled loops of bowel. Electronically Signed   By: Sharlet Salina M.D.   On: 10/29/2023 20:35   DG Abd 1 View Result Date: 10/29/2023 CLINICAL DATA:  NG tube placement EXAM: ABDOMEN - 1 VIEW limited for tube placement upright COMPARISON:  CT 10/28/2023 FINDINGS: Limited under penetrated x-ray demonstrates enteric tube in place with tip overlying the upper stomach but the side hole above the GE junction. Recommend this be advanced further several cm to reach the stomach. Nonspecific bowel gas pattern elsewhere in the midabdomen. Few distended small bowel loops identified as on prior CT scan IMPRESSION: Enteric tube in place. Side hole above the GE junction. Recommend further advancement into the stomach Electronically Signed   By: Karen Kays M.D.   On: 10/29/2023 13:55   CT ABDOMEN PELVIS W CONTRAST Result Date: 10/28/2023 CLINICAL DATA:  Abdominal pain, constipation, history  of rectal cancer EXAM: CT ABDOMEN AND PELVIS WITH CONTRAST TECHNIQUE: Multidetector CT imaging of the abdomen and pelvis was performed using the standard protocol following bolus administration of intravenous contrast. RADIATION DOSE REDUCTION: This exam was performed according to the departmental dose-optimization program which includes automated exposure control, adjustment of the mA and/or kV according to patient size and/or use of iterative reconstruction technique. CONTRAST:  OMNIPAQUE IOHEXOL 300 MG/ML  SOLN COMPARISON:  07/02/2023 FINDINGS: Lower chest: Trace left  pleural effusion. Minimal left lower lobe atelectasis. Hepatobiliary: Subtle nodular contour of the liver capsule more pronounced on this contrast exam. Numerous subcentimeter hypodensities are seen throughout the liver parenchyma, not grossly changed since the 2023 study and most consistent with hepatic cysts. Given findings elsewhere within the abdomen and pelvis, metastatic disease would be difficult to exclude. Gallbladder is decompressed, with stable calcified gallstones. No evidence of acute cholecystitis. Pancreas: Unremarkable. No pancreatic ductal dilatation or surrounding inflammatory changes. Spleen: Stable borderline splenomegaly. No focal parenchymal abnormality. Adrenals/Urinary Tract: There are multiple bilateral simple appearing renal cortical cysts. Stable complex hemorrhagic cyst lower pole left kidney image 43/2 measuring 1.6 cm. No specific imaging follow-up is required. No urinary tract calculi or obstructive uropathy. The adrenals and bladder are unremarkable. Stomach/Bowel: Since the previous staging exam, circumferential wall thickening of the rectum has progressed, measuring up to 15 mm in thickness reference image 80/2. Multiple loops of dilated jejunum are seen within the central abdomen, measuring up to 3.7 cm in diameter, consistent with small-bowel obstruction. Transition point is seen within the distal jejunum within the lower central abdomen, compatible small bowel obstruction likely due to postsurgical adhesions or omental caking. Stable right upper quadrant enterostomy. Vascular/Lymphatic: No significant vascular findings. No discrete adenopathy within the abdomen or pelvis. Reproductive: Prostate is unremarkable. Other: Peritoneal carcinomatosis and omental caking again noted throughout the abdomen and pelvis, with interval development of moderate ascites. No free intraperitoneal gas. No abdominal wall hernia. Musculoskeletal: No acute or destructive bony abnormalities.  Reconstructed images demonstrate no additional findings. IMPRESSION: 1. Findings consistent with progression of metastatic rectal cancer. Progressive annular mural thickening of the rectum consistent with worsening primary disease, with interval worsening of omental caking and development of significant malignant ascites. 2. Small bowel obstruction, transition point in the mid to distal jejunum likely as result of omental caking or postsurgical adhesions. 3. Cirrhotic morphology of the liver, with numerous subcentimeter hypodensities throughout the liver parenchyma compatible with cysts. Given findings elsewhere within the abdomen and pelvis, hepatic metastases would be difficult to exclude. 4. Cholelithiasis without evidence of cholecystitis. 5. Small left pleural effusion. Electronically Signed   By: Sharlet Salina M.D.   On: 10/28/2023 19:10    Mr. Wieber was interviewed and examined.  He continues to have evidence of small bowel obstruction.  General surgery and interventional radiology are unable to place a palliative venting gastrostomy tube.  Mr. Chiarella has decided to enroll in hospice care.  He requested removal of the NG tube and discharge to home with hospice.  I saw him early this morning prior to the decision for discharge to home.  I contacted him by telephone this afternoon.  He plans to enroll in hospice care later today.  He will use Roxanol as needed for pain.  He will use sublingual Zofran and lorazepam as needed for nausea.  I will plan to serve as the primary provider with the home hospice team.  He will be scheduled for a telephone visit later this week.

## 2023-11-02 NOTE — Plan of Care (Signed)
  Problem: Education: Goal: Knowledge of General Education information will improve Description: Including pain rating scale, medication(s)/side effects and non-pharmacologic comfort measures Outcome: Progressing   Problem: Elimination: Goal: Will not experience complications related to bowel motility Outcome: Progressing   

## 2023-11-02 NOTE — TOC Transition Note (Signed)
Transition of Care [facility]) - Discharge Note   Patient Details  Name: Kenneth [name] MRN: [id number] Date of Birth: 02-16-1961  Transition of Care [facility]) CM/SW Contact:  Darleene Cleaver, LCSW Phone Number: 11/02/2023, 1:18 PM   Clinical Narrative:     Patient will be discharging back home with home hospice through [facility].  [facility] will be meeting with patient at 2pm today to enroll him in hospice services.  Patient's wife will be transporting him home.  TOC signing off, please reconsult with any other TOC needs.  Final next level of care: Home w Hospice Care Barriers to Discharge: Barriers Resolved   Patient Goals and CMS Choice Patient states their goals for this hospitalization and ongoing recovery are:: To return home with home hospice. CMS Medicare.gov Compare Post Acute Care list provided to:: Patient Choice offered to / list presented to : Patient [place] ownership interest in [facility].provided to:: Patient    Discharge Placement                       Discharge Plan and Services Additional resources added to the After Visit Summary for   In-house Referral: Clinical Social Work   Post Acute Care Choice: Durable Medical Equipment, Hospice          DME Arranged: Hospital bed, Walker rolling, Community education officer wheelchair with seat cushion DME Agency: Other - Comment [facility]) Date DME Agency Contacted: 10/30/23 Time DME Agency Contacted: 1530 Representative spoke with at DME Agency: [facility] Agency: Other - See comment [facility]) Date [facility] Agency Contacted: 10/30/23 Time HH Agency Contacted: 1530 Representative spoke with at [facility] Agency: Shawn  Social Drivers of Health (SDOH) Interventions SDOH Screenings   Food Insecurity: No Food Insecurity (10/28/2023)  Housing: Low Risk  (10/28/2023)  Transportation Needs: No Transportation Needs (10/28/2023)  Utilities: Not At Risk (10/28/2023)  Depression (PHQ2-9): Low  Risk  (01/06/2023)  Financial Resource Strain: Low Risk  (01/06/2023)  Social Connections: Unknown (04/01/2022)   Received from [facility], [facility]  Stress: No Stress Concern Present (01/06/2023)  Tobacco Use: High Risk (10/28/2023)     Readmission Risk Interventions     No data to display

## 2023-11-02 NOTE — Progress Notes (Signed)
Discharge instructions given to patient and all questions were answered. Patients port was left accessed per verbal orders from Rosalin Hawking, MD

## 2023-11-02 NOTE — Progress Notes (Signed)
Daily Progress Note   Patient Name: Kenneth [name]       Date: 11/02/2023 DOB: 03-Mar-1961  Age: 62 y.o. MRN#: [id number] Attending Physician: Kenneth Shipper, MD Primary Care Physician: Kenneth Artist, MD Admit Date: 10/28/2023 Length of Stay: 5 days  Reason for Consultation/Follow-up: Establishing goals of care  HPI/Patient Profile:  62 y.o. male  with past medical history of terminal rectal cancer per wife's report. He has malignant ascites, omental caking, and symptomatic rectal thickening and a hospice referral was placed last week. He presented to the ER for abdominal pain, gas, reflux and no BM in some time. Oncology recommended coming to the ER. He was admitted on 10/28/2023 with malignant SBO, metastatic rectal cancer with peritoneal and abdominal carcinomatosis, and others.    Palliative medicine was consulted for GOC conversations.  Subjective:   Subjective: Chart Reviewed. Updates received. Patient Assessed. Created space and opportunity for patient  and family to explore thoughts and feelings regarding current medical situation.  Today's Discussion: Patient denies nausea, wife at bedside, patient asks about his venting G tube placement, noted copious amount of output in the NG tube.     The patient has elected to go home with hospice.  Wife has already been contacted by [employer].     I told him that somebody from palliative medicine will follow-up each day that he is here. I provided emotional and general support through therapeutic listening, empathy, sharing of stories, therapeutic touch, and other techniques. I answered all questions and addressed all concerns to the best of my ability.  Review of Systems  Respiratory:  Negative for chest tightness and shortness of breath.   Cardiovascular:  Negative for chest pain.  Gastrointestinal:  Positive for abdominal pain (Improved). Negative for nausea and vomiting.    Objective:   Vital Signs:   BP (!) 142/90 (BP Location: Left Arm)   Pulse 63   Temp 97.6 F (36.4 C) (Oral)   Resp 18   Ht 5\' 11"  (1.803 m)   Wt 90 kg   SpO2 98%   BMI 27.67 kg/m   Physical Exam Vitals and nursing note reviewed.  Constitutional:      General: He is not in acute distress.    Appearance: He is ill-appearing.  HENT:     Head: Normocephalic and atraumatic.  Cardiovascular:     Rate and Rhythm: Normal rate.  Pulmonary:     Effort: Pulmonary effort is normal.  Abdominal:     General: Abdomen is flat.  Skin:    General: Skin is warm and dry.  Neurological:     General: No focal deficit present.     Mental Status: He is alert.  Psychiatric:        Mood and Affect: Mood normal.        Behavior: Behavior normal.     Palliative Assessment/Data: 50%    Existing Vynca/ACP Documentation: Advance directive signed 01/26/2006   Assessment & Plan:   Impression: Present on Admission:  Rectal cancer (HCC)  Peritoneal carcinomatosis (HCC)  Drug-induced polyneuropathy (HCC)  Stage 3a chronic kidney disease (HCC)  SBO (small bowel obstruction) (HCC)  62 year old male with acute presentation of chronic comorbidities as described above.  The patient has terminal rectal cancer and now presents with malignant SBO which complicates his picture further.  He understands this time is very limited.  He is electing for a venting G-tube for palliative/comfort purposes.  He is also electing to engage with hospice  for home hospice at discharge.  Plans are for IR placement of venting G-tube on Monday.  Remain in house for symptom management until that time.  [facility] collective is already on board.  Long-term prognosis grave.  SUMMARY OF RECOMMENDATIONS   Remain DNR-comfort Multi-disciplinary discussions are ongoing about the risks/benefits and feasibility of a G tube at this point. Dr Kenneth [name] and Surgery aware. Continue symptom management and current scope of treatment for now Palliative medicine  will continue to follow and participate in goals of care discussions.   Symptom Management:  Per oncology team Palliative medicine is available to assist if needed  Code Status: DNR-comfort  Prognosis: < 4 weeks  Discharge Planning: Home with Hospice  Discussed with: Patient, family, medical team, nursing team  Thank you for allowing [place] to participate in the care of Kenneth [name] PMT will continue to support holistically.  Mod MDM  Kenneth Hawking MD  palliative.   Detailed review of medical records (labs, imaging, vital signs), medically appropriate exam, discussed with treatment team, counseling and education to patient, family, & staff, documenting clinical information, medication management, coordination of care  Kenneth Dust, NP Palliative Medicine Team  Team Phone # [phone] 7 AM to 7 PM (Nights/Weekends: Call Primary Service)

## 2023-11-02 NOTE — Plan of Care (Signed)
  Problem: Education: Goal: Knowledge of General Education information will improve Description Including pain rating scale, medication(s)/side effects and non-pharmacologic comfort measures Outcome: Progressing   Problem: Health Behavior/Discharge Planning: Goal: Ability to manage health-related needs will improve Outcome: Progressing   

## 2023-11-02 NOTE — Progress Notes (Addendum)
   Subjective/Chief Complaint: NGT basically almost out, but IR procedure not til later today.   Objective: Vital signs in last 24 hours: Temp:  [97.6 F (36.4 C)-98.1 F (36.7 C)] 97.6 F (36.4 C) (12/16 0634) Pulse Rate:  [63-77] 63 (12/16 0634) Resp:  [16-18] 18 (12/16 0634) BP: (142-155)/(89-90) 142/90 (12/16 0634) SpO2:  [97 %-100 %] 98 % (12/16 0634) Last BM Date : 11/01/23 (ilesotomy)  Intake/Output from previous day: 12/15 0701 - 12/16 0700 In: 2016.1 [I.V.:1916.1; IV Piggyback:99.9] Out: 1475 [Urine:325; Emesis/NG output:1000; Stool:150] Intake/Output this shift: No intake/output data recorded.  PE: Gen: NAD Abd: soft, NGT mostly out.  [place] with bilious output present.  Lab Results:  Recent Labs    11/01/23 0236 11/02/23 0252  WBC 6.8 8.0  HGB 13.2 14.5  HCT 41.8 46.0  PLT 217 250   BMET Recent Labs    11/01/23 0236 11/02/23 0252  NA 136 135  K 3.5 4.0  CL 99 99  CO2 28 27  GLUCOSE 79 86  BUN 21 20  CREATININE 1.22 1.47*  CALCIUM 8.6* 8.8*   PT/INR Recent Labs    11/02/23 0252  LABPROT 15.6*  INR 1.2   ABG No results for input(s): "PHART", "HCO3" in the last 72 hours.  Invalid input(s): "PCO2", "PO2"  Studies/Results: DG Abd 1 View Result Date: 10/31/2023 CLINICAL DATA:  [id number] Encounter for feeding tube placement [id number] EXAM: ABDOMEN - 1 VIEW COMPARISON:  10/30/2023 FINDINGS: Limited radiograph of the lower chest and upper abdomen was obtained for the purposes of enteric tube localization. Enteric tube is seen coursing below the diaphragm with distal tipterminating within the expected location of the gastric body. IMPRESSION: Enteric tube terminates within the expected location of the proximal gastric body. Electronically Signed   By: Duanne Guess D.O.   On: 10/31/2023 16:27    Anti-infectives: Anti-infectives (From admission, onward)    None       Assessment/Plan: Metastatic rectal cancer with carcinomatosis  SBO -  high output ng, since procedure won't be til later today, will advance NGT so he doesn't get nauseated waiting on his procedure.  Once g-tube placed, NGT can be removed. -no further surgical needs at this time.  We will sign off  ADDENDUM: -IR has informed team that they will not be placing a g-tube due to high risk of peritonitis/infection/bleeding, etc.   -unfortunately no surgical options as PEG isn't possible due to amount of ascites between abdominal and stomach.  Open g-tube is not an option either due to wound problems from his ascites and his carcinomatosis may not even allow for mobility to access his stomach for placement.   -will defer to palliative, onc, primary, etc for further discussions and plans for the patient moving forward. -I have informed the patient and his spouse.  They understand.   FEN: NPO, NGT to LIWS, IVF per primary  VTE: ok to have LMWH or SQH from surgery standpoint - hold for IR ID: none    Letha Cape 11/02/2023

## 2023-11-03 ENCOUNTER — Ambulatory Visit: Payer: BC Managed Care – PPO

## 2023-11-04 ENCOUNTER — Encounter: Payer: Self-pay | Admitting: Oncology

## 2023-11-05 ENCOUNTER — Other Ambulatory Visit: Payer: Self-pay | Admitting: *Deleted

## 2023-11-05 ENCOUNTER — Telehealth: Payer: Self-pay

## 2023-11-05 ENCOUNTER — Inpatient Hospital Stay: Payer: BC Managed Care – PPO | Admitting: Oncology

## 2023-11-05 DIAGNOSIS — C20 Malignant neoplasm of rectum: Secondary | ICD-10-CM | POA: Diagnosis not present

## 2023-11-05 DIAGNOSIS — E860 Dehydration: Secondary | ICD-10-CM

## 2023-11-05 MED ORDER — SUCRALFATE 1 G PO TABS: 1.0000 g | ORAL_TABLET | Freq: Four times a day (QID) | ORAL | 1 refills | Status: DC

## 2023-11-05 NOTE — Progress Notes (Signed)
[facility]   HEMATOLOGY- ONCOLOGY TeleHEALTH OFFICE VISIT PROGRESS NOTE  I connected with Kenneth [name] on 11/05/23 at 4 PM by telephone and verified that I am speaking with the correct person using two identifiers.   I discussed the limitations, risks, security and privacy concerns of performing an evaluation and management service by telemedicine and the availability of in-person appointments. I also discussed with the patient that there may be a patient responsible charge related to this service. The patient expressed understanding and agreed to proceed.  Other persons participating in the visit and their role in the encounter: Wife  Patient's location: Home Provider's location: Office   Diagnosis: Rectal cancer  INTERVAL HISTORY:   Kenneth [name] was discharged from hospital 11/02/2023 after admission with a small bowel obstruction.  The obstruction did not resolve after several days of NG tube decompression.  He elected to enroll in hospice care at discharge from the hospital.  He is seen today for a telehealth visit.  He reports rectal pain is relieved with tramadol.  He takes Roxanol infrequently.  He reports vomiting approximately once daily.  He has approximately 200 cc of output from the ileostomy daily.  He feels weak and "dehydrated ".  He would like to receive intravenous fluids.  Objective:  Lab Results:  Lab Results  Component Value Date   WBC 8.0 11/02/2023   HGB 14.5 11/02/2023   HCT 46.0 11/02/2023   MCV 86.8 11/02/2023   PLT 250 11/02/2023   NEUTROABS 6.2 11/02/2023    Imaging:  No results found.  Medications: I have reviewed the patient's current medications.  Assessment/Plan:   Disposition:  Rectal cancer-mass at 11 cm on proctoscopy by Dr. Byrd Hesselbach 02/04/2022 Colonoscopy 12/26/2021-obstructing mass at the rectosigmoid measured at 15 cm, biopsy invasive moderate to poorly differentiated adenocarcinoma, mismatch repair protein  expression intact, MSS, tumor mutation burden 4, RAS wild-type CTs 12/31/2021-masslike circumferential thickening of the distal sigmoid colon with mildly enlarged pericolonic lymph nodes, numerous subcentimeter hypodense liver lesions, splenomegaly MRI abdomen 01/07/2022-innumerable T2 hyperintense liver lesions consistent with cysts, spleen unremarkable, no ascites or adenopathy, moderate colonic fecal retention, no bowel obstruction MRI pelvis 01/22/2022-T3cN2 tumor above and below the peritoneal reflection, tumor 11.3 cm from the anal verge and 6.1 cm from the sphincter complex, multiple enlarged perirectal nodes, 1.1 cm node versus tumor deposit at the right aspect of the tumor Diverting loop ileostomy 01/15/2022 Cycle 1 FOLFOX 02/17/2022 Cycle 2 FOLFOX 03/04/2022 Cycle 3 FOLFOX held on 03/17/2022 due to neutropenia Cycle 3 FOLFOX 03/17/2022, Udenyca Treatment held 03/31/2022 due to dehydration Cycle 4 FOLFOX 04/07/2022 Cycle 5 FOLFOX 04/22/2022, oxaliplatin dose reduced due to thrombocytopenia Cycle 6 FOLFOX 05/05/2022 Cycle 7 FOLFOX 05/19/2022 Cycle 8 FOLFOX held secondary to patient preference and toxicity (diarrhea/weight loss/neuropathy symptoms) 06/16/2022 radiation/Xeloda-07/24/2022 MRI 08/27/2022-no suspicious lymph nodes; significant reduction in size with near complete resolution of previously identified tumor deposit versus node along the right aspect of the tumor; overall reduction in bulk and signal of mucinous high rectal malignancy, mrTRG-3 10/06/2022-exploratory laparotomy and laparoscopy-carcinomatosis with low-volume peritoneal disease, dominant area adjacent to sigmoid and right lower quadrant, less than 5 mm implants in the right upper quadrant and left lower quadrant.  PCI 5 CTs 11/14/2022-decrease circumferential wall thickening in the upper rectum and decreased mesorectal lymphadenopathy, no evidence of distant metastatic disease, stable mild splenomegaly, focal airspace opacity in the left  upper lobe-likely inflammatory CTs 07/02/2023-extensive omental caking and peritoneal tumor, mass in the lesser sac, circumferential wall thickening in  the rectum CT Abdo/pelvis 10/28/2023-progressive thickening of the rectum, worsening of omental caking and development of ascites, small bowel obstruction with a transition point in the mid to distal jejunum Gout Psoriasis Hypertension Left scalene node versus cyst/lipoma on exam 02/03/2022-CT neck 02/10/2022 with 9.5 mm lymph node in the left supraclavicular region posterior to the sternocleidomastoid muscle.  No other prominent lymph nodes in the neck.  Biopsy 02/12/2022 compatible with benign lymph node, negative for metastatic carcinoma. 10/18/2023 with a small bowel obstruction    [name] has metastatic rectal cancer with abdominal carcinomatosis.  He has a small bowel obstruction.  The obstruction did not resolve with G-tube decompression.  He was discharged to home 11/02/2023 with hospice care.  He continues to have intermittent vomiting and limited output from the ileostomy.  He complains of heartburn.  The heartburn is transiently relieved with Mylanta.  He continues to have rectal pain.  [name] would like to receive IV fluids.  He feels symptomatic from dehydration.  We will plan for intravenous fluids at the cancer center tomorrow.  He is amenable to consider placement of an NG tube for refractory nausea and vomiting.  We we will schedule an outpatient appointment at the cancer center versus a telehealth visit within the next few weeks.   I discussed the assessment and treatment plan with the patient. The patient was provided an opportunity to ask questions and all were answered. The patient agreed with the plan and demonstrated an understanding of the instructions.   The patient was advised to call back or seek an in-person evaluation if the symptoms worsen or if the condition fails to improve as anticipated.  I provided 20  minutes of chart review, telephone, and documentation time during this encounter, and > 50% was spent counseling as documented under my assessment & plan.  Thornton Papas ANP/GNP-BC   11/05/2023 4:24 PM

## 2023-11-05 NOTE — Telephone Encounter (Signed)
Nancy from [facility] reached out to discuss the wife's requests. She has asked for the patient to receive a PEG tube and IVF fluid. However, the hospice has indicated that these requests are not included in their plan of care. I informed the nurse that we are aware of the situation, and the provider will communicate with the patient directly.

## 2023-11-06 ENCOUNTER — Encounter (HOSPITAL_COMMUNITY): Payer: Self-pay

## 2023-11-06 ENCOUNTER — Emergency Department (HOSPITAL_COMMUNITY)
Admission: EM | Admit: 2023-11-06 | Discharge: 2023-11-06 | Disposition: A | Discharge status: Home / Self Care | Payer: BC Managed Care – PPO | Attending: Emergency Medicine | Admitting: Emergency Medicine

## 2023-11-06 ENCOUNTER — Telehealth: Payer: Self-pay

## 2023-11-06 ENCOUNTER — Inpatient Hospital Stay: Payer: BC Managed Care – PPO

## 2023-11-06 ENCOUNTER — Emergency Department (HOSPITAL_COMMUNITY): Payer: BC Managed Care – PPO

## 2023-11-06 ENCOUNTER — Ambulatory Visit: Payer: BC Managed Care – PPO

## 2023-11-06 ENCOUNTER — Other Ambulatory Visit: Payer: Self-pay

## 2023-11-06 DIAGNOSIS — K8020 Calculus of gallbladder without cholecystitis without obstruction: Secondary | ICD-10-CM | POA: Diagnosis not present

## 2023-11-06 DIAGNOSIS — N289 Disorder of kidney and ureter, unspecified: Secondary | ICD-10-CM | POA: Diagnosis not present

## 2023-11-06 DIAGNOSIS — K56609 Unspecified intestinal obstruction, unspecified as to partial versus complete obstruction: Secondary | ICD-10-CM | POA: Insufficient documentation

## 2023-11-06 DIAGNOSIS — Z4682 Encounter for fitting and adjustment of non-vascular catheter: Secondary | ICD-10-CM | POA: Diagnosis not present

## 2023-11-06 DIAGNOSIS — N281 Cyst of kidney, acquired: Secondary | ICD-10-CM | POA: Diagnosis not present

## 2023-11-06 DIAGNOSIS — R109 Unspecified abdominal pain: Secondary | ICD-10-CM | POA: Diagnosis not present

## 2023-11-06 DIAGNOSIS — Z932 Ileostomy status: Secondary | ICD-10-CM | POA: Diagnosis not present

## 2023-11-06 DIAGNOSIS — R188 Other ascites: Secondary | ICD-10-CM | POA: Diagnosis not present

## 2023-11-06 DIAGNOSIS — Z452 Encounter for adjustment and management of vascular access device: Secondary | ICD-10-CM | POA: Diagnosis not present

## 2023-11-06 DIAGNOSIS — J9811 Atelectasis: Secondary | ICD-10-CM | POA: Diagnosis not present

## 2023-11-06 MED ORDER — LIDOCAINE HCL URETHRAL/MUCOSAL 2 % EX GEL
1.0000 | Freq: Once | CUTANEOUS | Status: AC
  Administered 2023-11-06: 1
  Filled 2023-11-06: qty 11

## 2023-11-06 MED ORDER — HYDROMORPHONE HCL 1 MG/ML IJ SOLN
1.0000 mg | Freq: Once | INTRAMUSCULAR | Status: AC
Start: 2023-11-06 — End: 2023-11-06
  Administered 2023-11-06: 1 mg via INTRAVENOUS
  Filled 2023-11-06: qty 1

## 2023-11-06 MED ORDER — HYDROMORPHONE HCL 1 MG/ML IJ SOLN
1.0000 mg | Freq: Once | INTRAMUSCULAR | Status: AC
  Administered 2023-11-06: 1 mg via INTRAVENOUS
  Filled 2023-11-06: qty 1

## 2023-11-06 MED ORDER — LORAZEPAM 2 MG/ML IJ SOLN
1.0000 mg | Freq: Once | INTRAMUSCULAR | Status: AC
  Administered 2023-11-06: 1 mg via INTRAVENOUS
  Filled 2023-11-06: qty 1

## 2023-11-06 MED ORDER — ONDANSETRON HCL 4 MG/2ML IJ SOLN
4.0000 mg | Freq: Once | INTRAMUSCULAR | Status: AC
  Administered 2023-11-06: 4 mg via INTRAVENOUS
  Filled 2023-11-06: qty 2

## 2023-11-06 NOTE — Discharge Instructions (Addendum)
Like we discussed, you can drain the NG tube as needed for discomfort.  You can continue taking all of your medications.  Please call your hospice doctors tomorrow.  Return to the emergency department as needed for any medical care.  It was very nice to meet you both.

## 2023-11-06 NOTE — Telephone Encounter (Signed)
Harriett Sine, RN from [facility], has reported that the patient requests the placement of a nasogastric (NG) tube and anti-nausea medication. The patient is currently experiencing vomiting and nausea and is unable to retain any food or fluids. Both the patient and his wife have expressed the need for the NG tube and for the patient to receive medication and fluids in-person. The patient previously visited [facility] for IV fluids and anti-nausea medication. Hospice is unable to provide the necessary items for the NG tube until the following week. After consulting with Interventional Radiology (IR), it is recommended that the patient visit the emergency department for the placement of the NG tube. The hospice nurse has been informed of this recommendation.

## 2023-11-06 NOTE — ED Provider Triage Note (Signed)
Emergency Medicine Provider Triage Evaluation Note  Kenneth [name] , [name] 62 y.o. male  was evaluated in triage.  Pt complains of needing NG tube. He is on hospice. Recent hospital admission for SBO due to CA. Has ostomy bag. Received IVF at [facility] center today. Hospice wants NG tube placed due to persistent N/V. Family does not want work up on NG tube.  Review of Systems  Positive: emesis Negative:   Physical Exam  BP 128/87 (BP Location: Right Arm)   Pulse (!) 105   Temp 98.1 F (36.7 C) (Oral)   Resp 18   Ht 5\' 11"  (1.803 m)   Wt 89.8 kg   SpO2 96%   BMI 27.62 kg/m  Gen:   Awake, no distress, appears ill Resp:  Normal effort  MSK:   Moves extremities without difficulty  Other:    Medical Decision Making  Medically screening exam initiated at 6:27 PM.  Appropriate orders placed.  Kenneth [name] was informed that the remainder of the evaluation will be completed by another provider, this initial triage assessment does not replace that evaluation, and the importance of remaining in the ED until their evaluation is complete.  N/V  Hospice patient, req NG tube   Kenneth Dell A, PA-C 11/06/23 1828

## 2023-11-06 NOTE — ED Triage Notes (Signed)
Pt was discharged from the hospital on the 16th. Returning today for SBO, abd pain, emesis, and poor ostomy output.

## 2023-11-06 NOTE — ED Provider Notes (Signed)
[place] EMERGENCY DEPARTMENT AT [name] Provider Note   CSN: [id number] Arrival date & time: 11/06/23  1742     History  Chief Complaint  Patient presents with   Abdominal Pain    Kenneth [name] is a 63 y.o. male.  This is a 62 year old male with metastatic cancer.  Patient was recently discharged with small bowel obstruction.  He is on hospice care.  Comes in today because he wants the NG tube placed back for comfort.  He is currently seeing hospice care at home.   Abdominal Pain      Home Medications Prior to Admission medications   Medication Sig Start Date End Date Taking? Authorizing Provider  albuterol (PROVENTIL HFA) 108 (90 Base) MCG/ACT inhaler Inhale 2 puffs into the lungs every 6 (six) hours as needed for wheezing or shortness of breath. 12/26/22  Yes [name], MD  lidocaine (XYLOCAINE) 5 % ointment Apply 1 Application topically as needed (to numb the port site).   Yes [provider]  LORazepam (ATIVAN) 0.5 MG tablet Place 1 tablet (0.5 mg total) under the tongue every 4 (four) hours as needed (nausea). 11/02/23  Yes [name], MD  melatonin (MELATONIN MAXIMUM STRENGTH) 5 MG TABS Take 10 mg by mouth at bedtime. 01/10/22  Yes [provider]  Morphine Sulfate (MORPHINE CONCENTRATE) 10 mg / 0.5 ml concentrated solution Place 0.5 mLs (10 mg total) under the tongue every 4 (four) hours as needed for shortness of breath or severe pain (pain score 7-10). 11/02/23  Yes Osvaldo Shipper, MD  ondansetron (ZOFRAN-ODT) 8 MG disintegrating tablet Take 1 tablet (8 mg total) by mouth every 8 (eight) hours as needed for nausea or vomiting. 11/02/23  Yes Osvaldo Shipper, MD  pantoprazole (PROTONIX) 20 MG tablet Take 1 tablet (20 mg total) by mouth daily. Patient taking differently: Take 20 mg by mouth as needed for heartburn or indigestion. 10/26/23  Yes Rana Snare, NP  sodium chloride 0.9 % Inject 1,000 mLs into the vein See admin  instructions. Three times a week   Yes [provider]  sucralfate (CARAFATE) 1 g tablet Take 1 tablet (1 g total) by mouth 4 (four) times daily. Dissolve in 20 ml water and drink 11/05/23  Yes [name], MD  traMADol (ULTRAM) 50 MG tablet Take 1 tablet (50 mg total) by mouth every 6 (six) hours as needed for pain. 10/26/23  Yes Rana Snare, NP  traZODone (DESYREL) 50 MG tablet Take 50 mg by mouth at bedtime. 11/03/23  Yes [provider]  Potassium Chloride ER 20 MEQ TBCR Take 1 tablet (20 MEQ) by mouth once daily. 03/17/22 07/23/22        Allergies    Patient has no known allergies.    Review of Systems   Review of Systems  Gastrointestinal:  Positive for abdominal pain.    Physical Exam Updated Vital Signs BP 127/83 (BP Location: Left Arm)   Pulse 80   Temp 98 F (36.7 C) (Oral)   Resp 18   Ht 5\' 11"  (1.803 m)   Wt 89.8 kg   SpO2 95%   BMI 27.62 kg/m  Physical Exam Vitals reviewed.  Constitutional:      Appearance: He is ill-appearing.  Abdominal:     General: Abdomen is flat.     Palpations: Abdomen is soft.     ED Results / Procedures / Treatments   Labs (all labs ordered are listed, but only  abnormal results are displayed) Labs Reviewed - No data to display  EKG None  Radiology DG Abd Portable 1 View Result Date: 11/06/2023 CLINICAL DATA:  NG tube placement verification EXAM: PORTABLE ABDOMEN - 1 VIEW COMPARISON:  11/06/2023 FINDINGS: Limited field of view for tube placement verification purposes. An enteric tube has been placed with tip projecting over the left upper quadrant consistent with location in the upper stomach. A right central venous catheter is incidentally demonstrated with tip projecting over the cavoatrial junction. Linear atelectasis in the left lung base. Visualized bowel gas pattern is normal. IMPRESSION: Enteric tube tip projects over the left upper quadrant consistent with location in the upper stomach. Electronically  Signed   By: Burman Nieves M.D.   On: 11/06/2023 21:38   DG Abd Portable 1 View Result Date: 11/06/2023 CLINICAL DATA:  NG tube EXAM: PORTABLE ABDOMEN - 1 VIEW COMPARISON:  10/31/2023 FINDINGS: Esophageal tube tip in the right lower lobe bronchus. Mild air distension of central small bowel incompletely assessed IMPRESSION: Esophageal tube tip in the right lower lobe bronchus. Critical Value/emergent results were called by telephone at the time of interpretation on 11/06/2023 at 9:03 pm to provider [facility] , who verbally acknowledged these results. Electronically Signed   By: Jasmine Pang M.D.   On: 11/06/2023 21:03   CT ABDOMEN PELVIS WO CONTRAST Result Date: 11/06/2023 CLINICAL DATA:  Concern for bowel obstruction. History of rectal cancer. EXAM: CT ABDOMEN AND PELVIS WITHOUT CONTRAST TECHNIQUE: Multidetector CT imaging of the abdomen and pelvis was performed following the standard protocol without IV contrast. RADIATION DOSE REDUCTION: This exam was performed according to the departmental dose-optimization program which includes automated exposure control, adjustment of the mA and/or kV according to patient size and/or use of iterative reconstruction technique. COMPARISON:  CT abdomen pelvis dated 10/28/2023. FINDINGS: Evaluation of this exam is limited in the absence of intravenous contrast. Lower chest: Partially visualized small left pleural effusion with partial compressive atelectasis of the left lower lobe. Pneumonia is not excluded. There is a small ascites.  No intraperitoneal free air. Hepatobiliary: There is irregularity of the liver contour which may represent cirrhosis or secondary to metastatic implant. Subcentimeter hepatic hypodense lesions are too small to characterize but similar to prior CT. Small gallstones. Pancreas: The pancreas is unremarkable as visualized. Spleen: Normal in size without focal abnormality. Adrenals/Urinary Tract: The adrenal glands unremarkable. There is  no hydronephrosis or nephrolithiasis on either side. Left renal interpolar cysts as well as additional hypodense lesion in the inferior pole of the left kidney which is not characterized on this noncontrast CT but similar to prior CT. The urinary bladder is grossly remarkable. Stomach/Bowel: Evaluation of the bowel is limited in the absence of oral contrast. Circumferential thickening of the rectum as seen previously. There is thickened appearance of the gastric wall with higher attenuating material than ascitic fluid concerning for serosal implants. There is thickened appearance of the bowel loops which may be related to ascites or serosal implant. Mildly dilated small bowel measures up to 3.6 cm in caliber. The distal small bowel are collapsed. Findings concerning for small bowel obstruction with transition in the lower abdomen to the right of the midline possibly secondary to adhesions or implant. Similar appearance of right upper quadrant ostomy. The appendix is not identified. Vascular/Lymphatic: The abdominal aorta and IVC are grossly unremarkable. No portal venous gas. No obvious retroperitoneal adenopathy. Reproductive: The prostate is grossly unremarkable. Other: Thickened and slightly nodular appearing of the upper abdominal peritoneal  wall along the liver and in the gastrohepatic region concerning for implants. Evaluation however is limited in the absence of contrast. Musculoskeletal: No acute osseous pathology. IMPRESSION: 1. Findings concerning for small bowel obstruction with transition in the lower abdomen to the right of the midline possibly secondary to adhesions or implant. 2. Small ascites with findings concerning for peritoneal and serosal based carcinomatosis. 3. Cholelithiasis. 4. Partially visualized small left pleural effusion with partial compressive atelectasis of the left lower lobe. Pneumonia is not excluded. Electronically Signed   By: Elgie Collard M.D.   On: 11/06/2023 20:58     Procedures Procedures    Medications Ordered in ED Medications  HYDROmorphone (DILAUDID) injection 1 mg (1 mg Intravenous Given 11/06/23 2001)  ondansetron (ZOFRAN) injection 4 mg (4 mg Intravenous Given 11/06/23 2005)  lidocaine (XYLOCAINE) 2 % jelly 1 Application (1 Application Other Given 11/06/23 2024)  LORazepam (ATIVAN) injection 1 mg (1 mg Intravenous Given 11/06/23 2015)  HYDROmorphone (DILAUDID) injection 1 mg (1 mg Intravenous Given 11/06/23 2113)    ED Course/ Medical Decision Making/ A&P                                 Medical Decision Making 62 year old male here today with likely small bowel obstruction.  Plan-reviewed the patient's previous hospital course notes.  It appears that surgical G-tube is not possible given this patient's condition and presence of ascites.  He had an NG tube that was replaced.  Patient was offered to go home with the NG tube, however did not want to at that time.  Unfortunately, he comes back for increasing abdominal distention and pain.  CT imaging is consistent with small bowel obstruction.  Placed nasogastric tube, first attempt unfortunately went into the right bronchus.  Was replaced, confirmed on x-ray, and patient had output of 500 cc from NG tube.  Coached patient's wife on care for NG tube at home.  They are going to call their hospice nurse tomorrow.  This patient's health care is complicated by the following social determinants of health-hospice patient, metastatic cancer.  I considered labs in this patient, however they would not change management given that he is a hospice patient.  Amount and/or Complexity of Data Reviewed Radiology: ordered.  Risk Prescription drug management.           Final Clinical Impression(s) / ED Diagnoses Final diagnoses:  Small bowel obstruction [facility])    Rx / DC Orders ED Discharge Orders     None         Arletha Pili, DO 11/06/23 2237

## 2023-11-09 ENCOUNTER — Inpatient Hospital Stay: Payer: BC Managed Care – PPO

## 2023-11-09 ENCOUNTER — Telehealth: Payer: Self-pay | Admitting: *Deleted

## 2023-11-09 NOTE — Telephone Encounter (Addendum)
Call to Mrs. Savino and received update on status: Draining a lot of bile colored liquid from NG tube. Nausea is OK. Still has the urgency to have BM. Up all night trying to pass stool rectally, with just blood/mucous output Still having small amount of output from ostomy Drinking water OK Pain is controlled with Tramadol every 6 hours. Had morphine only twice over weekend. Hospice is giving the IV fluids at home. Also having hiccups. Asking what can be done for this. Encouraged her to discuss this with Hospice RN when she comes in to start fluids. They may have standing orders for this issue. Also spoke with Hospice RN, Harriett Sine [phone]) and she reports they will administer his fluids over 5 hours and asking if OK to keep the prn IV antiemetic orders he had at [facility]? Informed her that will be OK

## 2023-11-10 ENCOUNTER — Ambulatory Visit: Payer: BC Managed Care – PPO

## 2023-11-12 ENCOUNTER — Ambulatory Visit: Payer: BC Managed Care – PPO

## 2023-11-13 ENCOUNTER — Ambulatory Visit: Payer: BC Managed Care – PPO

## 2023-11-13 ENCOUNTER — Inpatient Hospital Stay: Payer: BC Managed Care – PPO

## 2023-11-17 ENCOUNTER — Ambulatory Visit: Payer: BC Managed Care – PPO

## 2023-11-18 DIAGNOSIS — C20 Malignant neoplasm of rectum: Secondary | ICD-10-CM | POA: Diagnosis not present

## 2023-11-18 DIAGNOSIS — R1084 Generalized abdominal pain: Secondary | ICD-10-CM | POA: Diagnosis not present

## 2023-11-18 DIAGNOSIS — C786 Secondary malignant neoplasm of retroperitoneum and peritoneum: Secondary | ICD-10-CM | POA: Diagnosis not present

## 2023-11-18 DIAGNOSIS — N1831 Chronic kidney disease, stage 3a: Secondary | ICD-10-CM | POA: Diagnosis not present

## 2023-11-18 DIAGNOSIS — K56600 Partial intestinal obstruction, unspecified as to cause: Secondary | ICD-10-CM | POA: Diagnosis not present

## 2023-11-18 DIAGNOSIS — I10 Essential (primary) hypertension: Secondary | ICD-10-CM | POA: Diagnosis not present

## 2023-11-19 DIAGNOSIS — C786 Secondary malignant neoplasm of retroperitoneum and peritoneum: Secondary | ICD-10-CM | POA: Diagnosis not present

## 2023-11-19 DIAGNOSIS — K56600 Partial intestinal obstruction, unspecified as to cause: Secondary | ICD-10-CM | POA: Diagnosis not present

## 2023-11-19 DIAGNOSIS — C20 Malignant neoplasm of rectum: Secondary | ICD-10-CM | POA: Diagnosis not present

## 2023-11-19 DIAGNOSIS — R1084 Generalized abdominal pain: Secondary | ICD-10-CM | POA: Diagnosis not present

## 2023-11-19 DIAGNOSIS — N1831 Chronic kidney disease, stage 3a: Secondary | ICD-10-CM | POA: Diagnosis not present

## 2023-11-19 DIAGNOSIS — I10 Essential (primary) hypertension: Secondary | ICD-10-CM | POA: Diagnosis not present

## 2023-11-20 ENCOUNTER — Ambulatory Visit: Payer: BC Managed Care – PPO

## 2023-11-20 DIAGNOSIS — I10 Essential (primary) hypertension: Secondary | ICD-10-CM | POA: Diagnosis not present

## 2023-11-20 DIAGNOSIS — K56600 Partial intestinal obstruction, unspecified as to cause: Secondary | ICD-10-CM | POA: Diagnosis not present

## 2023-11-20 DIAGNOSIS — N1831 Chronic kidney disease, stage 3a: Secondary | ICD-10-CM | POA: Diagnosis not present

## 2023-11-20 DIAGNOSIS — R1084 Generalized abdominal pain: Secondary | ICD-10-CM | POA: Diagnosis not present

## 2023-11-20 DIAGNOSIS — C786 Secondary malignant neoplasm of retroperitoneum and peritoneum: Secondary | ICD-10-CM | POA: Diagnosis not present

## 2023-11-20 DIAGNOSIS — C20 Malignant neoplasm of rectum: Secondary | ICD-10-CM | POA: Diagnosis not present

## 2023-11-21 DIAGNOSIS — I10 Essential (primary) hypertension: Secondary | ICD-10-CM | POA: Diagnosis not present

## 2023-11-21 DIAGNOSIS — N1831 Chronic kidney disease, stage 3a: Secondary | ICD-10-CM | POA: Diagnosis not present

## 2023-11-21 DIAGNOSIS — K56600 Partial intestinal obstruction, unspecified as to cause: Secondary | ICD-10-CM | POA: Diagnosis not present

## 2023-11-21 DIAGNOSIS — R1084 Generalized abdominal pain: Secondary | ICD-10-CM | POA: Diagnosis not present

## 2023-11-21 DIAGNOSIS — C20 Malignant neoplasm of rectum: Secondary | ICD-10-CM | POA: Diagnosis not present

## 2023-11-21 DIAGNOSIS — C786 Secondary malignant neoplasm of retroperitoneum and peritoneum: Secondary | ICD-10-CM | POA: Diagnosis not present

## 2023-11-22 DIAGNOSIS — C20 Malignant neoplasm of rectum: Secondary | ICD-10-CM | POA: Diagnosis not present

## 2023-11-22 DIAGNOSIS — C786 Secondary malignant neoplasm of retroperitoneum and peritoneum: Secondary | ICD-10-CM | POA: Diagnosis not present

## 2023-11-22 DIAGNOSIS — R1084 Generalized abdominal pain: Secondary | ICD-10-CM | POA: Diagnosis not present

## 2023-11-22 DIAGNOSIS — K56600 Partial intestinal obstruction, unspecified as to cause: Secondary | ICD-10-CM | POA: Diagnosis not present

## 2023-11-22 DIAGNOSIS — N1831 Chronic kidney disease, stage 3a: Secondary | ICD-10-CM | POA: Diagnosis not present

## 2023-11-22 DIAGNOSIS — I10 Essential (primary) hypertension: Secondary | ICD-10-CM | POA: Diagnosis not present

## 2023-11-23 DIAGNOSIS — I10 Essential (primary) hypertension: Secondary | ICD-10-CM | POA: Diagnosis not present

## 2023-11-23 DIAGNOSIS — C20 Malignant neoplasm of rectum: Secondary | ICD-10-CM | POA: Diagnosis not present

## 2023-11-23 DIAGNOSIS — C786 Secondary malignant neoplasm of retroperitoneum and peritoneum: Secondary | ICD-10-CM | POA: Diagnosis not present

## 2023-11-23 DIAGNOSIS — R1084 Generalized abdominal pain: Secondary | ICD-10-CM | POA: Diagnosis not present

## 2023-11-23 DIAGNOSIS — K56600 Partial intestinal obstruction, unspecified as to cause: Secondary | ICD-10-CM | POA: Diagnosis not present

## 2023-11-23 DIAGNOSIS — N1831 Chronic kidney disease, stage 3a: Secondary | ICD-10-CM | POA: Diagnosis not present

## 2023-11-24 ENCOUNTER — Ambulatory Visit: Payer: BC Managed Care – PPO

## 2023-11-24 DIAGNOSIS — C20 Malignant neoplasm of rectum: Secondary | ICD-10-CM | POA: Diagnosis not present

## 2023-11-24 DIAGNOSIS — K56600 Partial intestinal obstruction, unspecified as to cause: Secondary | ICD-10-CM | POA: Diagnosis not present

## 2023-11-24 DIAGNOSIS — I10 Essential (primary) hypertension: Secondary | ICD-10-CM | POA: Diagnosis not present

## 2023-11-24 DIAGNOSIS — N1831 Chronic kidney disease, stage 3a: Secondary | ICD-10-CM | POA: Diagnosis not present

## 2023-11-24 DIAGNOSIS — R1084 Generalized abdominal pain: Secondary | ICD-10-CM | POA: Diagnosis not present

## 2023-11-24 DIAGNOSIS — C786 Secondary malignant neoplasm of retroperitoneum and peritoneum: Secondary | ICD-10-CM | POA: Diagnosis not present

## 2023-11-25 DIAGNOSIS — I10 Essential (primary) hypertension: Secondary | ICD-10-CM | POA: Diagnosis not present

## 2023-11-25 DIAGNOSIS — C20 Malignant neoplasm of rectum: Secondary | ICD-10-CM | POA: Diagnosis not present

## 2023-11-25 DIAGNOSIS — K56600 Partial intestinal obstruction, unspecified as to cause: Secondary | ICD-10-CM | POA: Diagnosis not present

## 2023-11-25 DIAGNOSIS — N1831 Chronic kidney disease, stage 3a: Secondary | ICD-10-CM | POA: Diagnosis not present

## 2023-11-25 DIAGNOSIS — C786 Secondary malignant neoplasm of retroperitoneum and peritoneum: Secondary | ICD-10-CM | POA: Diagnosis not present

## 2023-11-25 DIAGNOSIS — R1084 Generalized abdominal pain: Secondary | ICD-10-CM | POA: Diagnosis not present

## 2023-11-26 DIAGNOSIS — N1831 Chronic kidney disease, stage 3a: Secondary | ICD-10-CM | POA: Diagnosis not present

## 2023-11-26 DIAGNOSIS — I10 Essential (primary) hypertension: Secondary | ICD-10-CM | POA: Diagnosis not present

## 2023-11-26 DIAGNOSIS — K56600 Partial intestinal obstruction, unspecified as to cause: Secondary | ICD-10-CM | POA: Diagnosis not present

## 2023-11-26 DIAGNOSIS — C786 Secondary malignant neoplasm of retroperitoneum and peritoneum: Secondary | ICD-10-CM | POA: Diagnosis not present

## 2023-11-26 DIAGNOSIS — R1084 Generalized abdominal pain: Secondary | ICD-10-CM | POA: Diagnosis not present

## 2023-11-26 DIAGNOSIS — C20 Malignant neoplasm of rectum: Secondary | ICD-10-CM | POA: Diagnosis not present

## 2023-11-27 ENCOUNTER — Telehealth: Payer: Self-pay | Admitting: *Deleted

## 2023-11-27 ENCOUNTER — Ambulatory Visit: Payer: BC Managed Care – PPO

## 2023-11-27 ENCOUNTER — Inpatient Hospital Stay: Payer: BC Managed Care – PPO | Admitting: Oncology

## 2023-11-27 ENCOUNTER — Other Ambulatory Visit: Payer: Self-pay | Admitting: Oncology

## 2023-11-27 DIAGNOSIS — N1831 Chronic kidney disease, stage 3a: Secondary | ICD-10-CM | POA: Diagnosis not present

## 2023-11-27 DIAGNOSIS — C20 Malignant neoplasm of rectum: Secondary | ICD-10-CM | POA: Diagnosis not present

## 2023-11-27 DIAGNOSIS — C786 Secondary malignant neoplasm of retroperitoneum and peritoneum: Secondary | ICD-10-CM | POA: Diagnosis not present

## 2023-11-27 DIAGNOSIS — R1084 Generalized abdominal pain: Secondary | ICD-10-CM | POA: Diagnosis not present

## 2023-11-27 DIAGNOSIS — K56600 Partial intestinal obstruction, unspecified as to cause: Secondary | ICD-10-CM | POA: Diagnosis not present

## 2023-11-27 DIAGNOSIS — I10 Essential (primary) hypertension: Secondary | ICD-10-CM | POA: Diagnosis not present

## 2023-11-27 MED ORDER — TRAMADOL HCL 50 MG PO TABS: 50.0000 mg | ORAL_TABLET | Freq: Four times a day (QID) | ORAL | 0 refills | Status: DC | PRN

## 2023-11-27 NOTE — Telephone Encounter (Signed)
 Hospice RN called requesting OK to refill his Tramadol  50 mg--has been taking it more trying to avoid the morphine . Was taking more that prescribed. She has spoken to him to only take as prescribed and use the morphine  more. Also requests to change the IVF to every other day and asking what Dr. Cloretta thinks. Per Dr. Cloretta: OK for every other day fluid-up to hospice protocol. Confirmed with [facility] that he has #2 refills on file for the Tramadol . They will prepare for him.

## 2023-11-28 DIAGNOSIS — C786 Secondary malignant neoplasm of retroperitoneum and peritoneum: Secondary | ICD-10-CM | POA: Diagnosis not present

## 2023-11-28 DIAGNOSIS — I10 Essential (primary) hypertension: Secondary | ICD-10-CM | POA: Diagnosis not present

## 2023-11-28 DIAGNOSIS — R1084 Generalized abdominal pain: Secondary | ICD-10-CM | POA: Diagnosis not present

## 2023-11-28 DIAGNOSIS — C20 Malignant neoplasm of rectum: Secondary | ICD-10-CM | POA: Diagnosis not present

## 2023-11-28 DIAGNOSIS — N1831 Chronic kidney disease, stage 3a: Secondary | ICD-10-CM | POA: Diagnosis not present

## 2023-11-28 DIAGNOSIS — K56600 Partial intestinal obstruction, unspecified as to cause: Secondary | ICD-10-CM | POA: Diagnosis not present

## 2023-11-29 DIAGNOSIS — N1831 Chronic kidney disease, stage 3a: Secondary | ICD-10-CM | POA: Diagnosis not present

## 2023-11-29 DIAGNOSIS — I10 Essential (primary) hypertension: Secondary | ICD-10-CM | POA: Diagnosis not present

## 2023-11-29 DIAGNOSIS — K56600 Partial intestinal obstruction, unspecified as to cause: Secondary | ICD-10-CM | POA: Diagnosis not present

## 2023-11-29 DIAGNOSIS — C20 Malignant neoplasm of rectum: Secondary | ICD-10-CM | POA: Diagnosis not present

## 2023-11-29 DIAGNOSIS — R1084 Generalized abdominal pain: Secondary | ICD-10-CM | POA: Diagnosis not present

## 2023-11-29 DIAGNOSIS — C786 Secondary malignant neoplasm of retroperitoneum and peritoneum: Secondary | ICD-10-CM | POA: Diagnosis not present

## 2023-11-30 DIAGNOSIS — R1084 Generalized abdominal pain: Secondary | ICD-10-CM | POA: Diagnosis not present

## 2023-11-30 DIAGNOSIS — C20 Malignant neoplasm of rectum: Secondary | ICD-10-CM | POA: Diagnosis not present

## 2023-11-30 DIAGNOSIS — N1831 Chronic kidney disease, stage 3a: Secondary | ICD-10-CM | POA: Diagnosis not present

## 2023-11-30 DIAGNOSIS — I10 Essential (primary) hypertension: Secondary | ICD-10-CM | POA: Diagnosis not present

## 2023-11-30 DIAGNOSIS — C786 Secondary malignant neoplasm of retroperitoneum and peritoneum: Secondary | ICD-10-CM | POA: Diagnosis not present

## 2023-11-30 DIAGNOSIS — K56600 Partial intestinal obstruction, unspecified as to cause: Secondary | ICD-10-CM | POA: Diagnosis not present

## 2023-12-01 ENCOUNTER — Telehealth: Payer: Self-pay | Admitting: *Deleted

## 2023-12-01 DIAGNOSIS — K56600 Partial intestinal obstruction, unspecified as to cause: Secondary | ICD-10-CM | POA: Diagnosis not present

## 2023-12-01 DIAGNOSIS — C20 Malignant neoplasm of rectum: Secondary | ICD-10-CM | POA: Diagnosis not present

## 2023-12-01 DIAGNOSIS — C786 Secondary malignant neoplasm of retroperitoneum and peritoneum: Secondary | ICD-10-CM | POA: Diagnosis not present

## 2023-12-01 DIAGNOSIS — R1084 Generalized abdominal pain: Secondary | ICD-10-CM | POA: Diagnosis not present

## 2023-12-01 DIAGNOSIS — N1831 Chronic kidney disease, stage 3a: Secondary | ICD-10-CM | POA: Diagnosis not present

## 2023-12-01 DIAGNOSIS — I10 Essential (primary) hypertension: Secondary | ICD-10-CM | POA: Diagnosis not present

## 2023-12-01 NOTE — Telephone Encounter (Signed)
 MD did not want to add another mediation Suggests he take ativan at HS. Nurse said he already does this. He also does not know reason for chills and nurse reports they do not need a video visit at this time.

## 2023-12-01 NOTE — Telephone Encounter (Signed)
 Hospice RN called to report the following occurrences for Kenneth [name]: Has had #2 episodes late at night in back where he has sudden sharp pain and it will improve after a few minutes of massage. IVF running last week and 1/2 way thru infusion he started having chills and shaking. Thermometer was not working, so could not check temp. Nurse reports no s/s infection and port looks good. Family asking for cause of both above. Informed RN back pain could be a muscle spasm and no way to know why he shivered during fluids. Suggested she ask if patient wants a video visit or to come to office to see Dr. Cloretta.

## 2023-12-02 DIAGNOSIS — R1084 Generalized abdominal pain: Secondary | ICD-10-CM | POA: Diagnosis not present

## 2023-12-02 DIAGNOSIS — K56600 Partial intestinal obstruction, unspecified as to cause: Secondary | ICD-10-CM | POA: Diagnosis not present

## 2023-12-02 DIAGNOSIS — N1831 Chronic kidney disease, stage 3a: Secondary | ICD-10-CM | POA: Diagnosis not present

## 2023-12-02 DIAGNOSIS — C786 Secondary malignant neoplasm of retroperitoneum and peritoneum: Secondary | ICD-10-CM | POA: Diagnosis not present

## 2023-12-02 DIAGNOSIS — C20 Malignant neoplasm of rectum: Secondary | ICD-10-CM | POA: Diagnosis not present

## 2023-12-02 DIAGNOSIS — I10 Essential (primary) hypertension: Secondary | ICD-10-CM | POA: Diagnosis not present

## 2023-12-03 DIAGNOSIS — R1084 Generalized abdominal pain: Secondary | ICD-10-CM | POA: Diagnosis not present

## 2023-12-03 DIAGNOSIS — N1831 Chronic kidney disease, stage 3a: Secondary | ICD-10-CM | POA: Diagnosis not present

## 2023-12-03 DIAGNOSIS — I10 Essential (primary) hypertension: Secondary | ICD-10-CM | POA: Diagnosis not present

## 2023-12-03 DIAGNOSIS — C786 Secondary malignant neoplasm of retroperitoneum and peritoneum: Secondary | ICD-10-CM | POA: Diagnosis not present

## 2023-12-03 DIAGNOSIS — C20 Malignant neoplasm of rectum: Secondary | ICD-10-CM | POA: Diagnosis not present

## 2023-12-03 DIAGNOSIS — K56600 Partial intestinal obstruction, unspecified as to cause: Secondary | ICD-10-CM | POA: Diagnosis not present

## 2023-12-04 DIAGNOSIS — R1084 Generalized abdominal pain: Secondary | ICD-10-CM | POA: Diagnosis not present

## 2023-12-04 DIAGNOSIS — N1831 Chronic kidney disease, stage 3a: Secondary | ICD-10-CM | POA: Diagnosis not present

## 2023-12-04 DIAGNOSIS — K56600 Partial intestinal obstruction, unspecified as to cause: Secondary | ICD-10-CM | POA: Diagnosis not present

## 2023-12-04 DIAGNOSIS — I10 Essential (primary) hypertension: Secondary | ICD-10-CM | POA: Diagnosis not present

## 2023-12-04 DIAGNOSIS — C20 Malignant neoplasm of rectum: Secondary | ICD-10-CM | POA: Diagnosis not present

## 2023-12-04 DIAGNOSIS — C786 Secondary malignant neoplasm of retroperitoneum and peritoneum: Secondary | ICD-10-CM | POA: Diagnosis not present

## 2023-12-05 DIAGNOSIS — C786 Secondary malignant neoplasm of retroperitoneum and peritoneum: Secondary | ICD-10-CM | POA: Diagnosis not present

## 2023-12-05 DIAGNOSIS — R1084 Generalized abdominal pain: Secondary | ICD-10-CM | POA: Diagnosis not present

## 2023-12-05 DIAGNOSIS — C20 Malignant neoplasm of rectum: Secondary | ICD-10-CM | POA: Diagnosis not present

## 2023-12-05 DIAGNOSIS — I10 Essential (primary) hypertension: Secondary | ICD-10-CM | POA: Diagnosis not present

## 2023-12-05 DIAGNOSIS — N1831 Chronic kidney disease, stage 3a: Secondary | ICD-10-CM | POA: Diagnosis not present

## 2023-12-05 DIAGNOSIS — K56600 Partial intestinal obstruction, unspecified as to cause: Secondary | ICD-10-CM | POA: Diagnosis not present

## 2023-12-06 DIAGNOSIS — K56600 Partial intestinal obstruction, unspecified as to cause: Secondary | ICD-10-CM | POA: Diagnosis not present

## 2023-12-06 DIAGNOSIS — N1831 Chronic kidney disease, stage 3a: Secondary | ICD-10-CM | POA: Diagnosis not present

## 2023-12-06 DIAGNOSIS — I10 Essential (primary) hypertension: Secondary | ICD-10-CM | POA: Diagnosis not present

## 2023-12-06 DIAGNOSIS — R1084 Generalized abdominal pain: Secondary | ICD-10-CM | POA: Diagnosis not present

## 2023-12-06 DIAGNOSIS — C20 Malignant neoplasm of rectum: Secondary | ICD-10-CM | POA: Diagnosis not present

## 2023-12-06 DIAGNOSIS — C786 Secondary malignant neoplasm of retroperitoneum and peritoneum: Secondary | ICD-10-CM | POA: Diagnosis not present

## 2023-12-07 DIAGNOSIS — C20 Malignant neoplasm of rectum: Secondary | ICD-10-CM | POA: Diagnosis not present

## 2023-12-07 DIAGNOSIS — N1831 Chronic kidney disease, stage 3a: Secondary | ICD-10-CM | POA: Diagnosis not present

## 2023-12-07 DIAGNOSIS — I10 Essential (primary) hypertension: Secondary | ICD-10-CM | POA: Diagnosis not present

## 2023-12-07 DIAGNOSIS — C786 Secondary malignant neoplasm of retroperitoneum and peritoneum: Secondary | ICD-10-CM | POA: Diagnosis not present

## 2023-12-07 DIAGNOSIS — R1084 Generalized abdominal pain: Secondary | ICD-10-CM | POA: Diagnosis not present

## 2023-12-07 DIAGNOSIS — K56600 Partial intestinal obstruction, unspecified as to cause: Secondary | ICD-10-CM | POA: Diagnosis not present

## 2023-12-08 DIAGNOSIS — R1084 Generalized abdominal pain: Secondary | ICD-10-CM | POA: Diagnosis not present

## 2023-12-08 DIAGNOSIS — K56600 Partial intestinal obstruction, unspecified as to cause: Secondary | ICD-10-CM | POA: Diagnosis not present

## 2023-12-08 DIAGNOSIS — I10 Essential (primary) hypertension: Secondary | ICD-10-CM | POA: Diagnosis not present

## 2023-12-08 DIAGNOSIS — C20 Malignant neoplasm of rectum: Secondary | ICD-10-CM | POA: Diagnosis not present

## 2023-12-08 DIAGNOSIS — C786 Secondary malignant neoplasm of retroperitoneum and peritoneum: Secondary | ICD-10-CM | POA: Diagnosis not present

## 2023-12-08 DIAGNOSIS — N1831 Chronic kidney disease, stage 3a: Secondary | ICD-10-CM | POA: Diagnosis not present

## 2023-12-09 DIAGNOSIS — C20 Malignant neoplasm of rectum: Secondary | ICD-10-CM | POA: Diagnosis not present

## 2023-12-09 DIAGNOSIS — R1084 Generalized abdominal pain: Secondary | ICD-10-CM | POA: Diagnosis not present

## 2023-12-09 DIAGNOSIS — C786 Secondary malignant neoplasm of retroperitoneum and peritoneum: Secondary | ICD-10-CM | POA: Diagnosis not present

## 2023-12-09 DIAGNOSIS — N1831 Chronic kidney disease, stage 3a: Secondary | ICD-10-CM | POA: Diagnosis not present

## 2023-12-09 DIAGNOSIS — I10 Essential (primary) hypertension: Secondary | ICD-10-CM | POA: Diagnosis not present

## 2023-12-09 DIAGNOSIS — K56600 Partial intestinal obstruction, unspecified as to cause: Secondary | ICD-10-CM | POA: Diagnosis not present

## 2023-12-10 DIAGNOSIS — C786 Secondary malignant neoplasm of retroperitoneum and peritoneum: Secondary | ICD-10-CM | POA: Diagnosis not present

## 2023-12-10 DIAGNOSIS — N1831 Chronic kidney disease, stage 3a: Secondary | ICD-10-CM | POA: Diagnosis not present

## 2023-12-10 DIAGNOSIS — R1084 Generalized abdominal pain: Secondary | ICD-10-CM | POA: Diagnosis not present

## 2023-12-10 DIAGNOSIS — K56600 Partial intestinal obstruction, unspecified as to cause: Secondary | ICD-10-CM | POA: Diagnosis not present

## 2023-12-10 DIAGNOSIS — I10 Essential (primary) hypertension: Secondary | ICD-10-CM | POA: Diagnosis not present

## 2023-12-10 DIAGNOSIS — C20 Malignant neoplasm of rectum: Secondary | ICD-10-CM | POA: Diagnosis not present

## 2023-12-11 DIAGNOSIS — I10 Essential (primary) hypertension: Secondary | ICD-10-CM | POA: Diagnosis not present

## 2023-12-11 DIAGNOSIS — C20 Malignant neoplasm of rectum: Secondary | ICD-10-CM | POA: Diagnosis not present

## 2023-12-11 DIAGNOSIS — N1831 Chronic kidney disease, stage 3a: Secondary | ICD-10-CM | POA: Diagnosis not present

## 2023-12-11 DIAGNOSIS — C786 Secondary malignant neoplasm of retroperitoneum and peritoneum: Secondary | ICD-10-CM | POA: Diagnosis not present

## 2023-12-11 DIAGNOSIS — K56600 Partial intestinal obstruction, unspecified as to cause: Secondary | ICD-10-CM | POA: Diagnosis not present

## 2023-12-11 DIAGNOSIS — R1084 Generalized abdominal pain: Secondary | ICD-10-CM | POA: Diagnosis not present

## 2023-12-12 DIAGNOSIS — I10 Essential (primary) hypertension: Secondary | ICD-10-CM | POA: Diagnosis not present

## 2023-12-12 DIAGNOSIS — K56600 Partial intestinal obstruction, unspecified as to cause: Secondary | ICD-10-CM | POA: Diagnosis not present

## 2023-12-12 DIAGNOSIS — C786 Secondary malignant neoplasm of retroperitoneum and peritoneum: Secondary | ICD-10-CM | POA: Diagnosis not present

## 2023-12-12 DIAGNOSIS — C20 Malignant neoplasm of rectum: Secondary | ICD-10-CM | POA: Diagnosis not present

## 2023-12-12 DIAGNOSIS — R1084 Generalized abdominal pain: Secondary | ICD-10-CM | POA: Diagnosis not present

## 2023-12-12 DIAGNOSIS — N1831 Chronic kidney disease, stage 3a: Secondary | ICD-10-CM | POA: Diagnosis not present

## 2023-12-13 DIAGNOSIS — C786 Secondary malignant neoplasm of retroperitoneum and peritoneum: Secondary | ICD-10-CM | POA: Diagnosis not present

## 2023-12-13 DIAGNOSIS — C20 Malignant neoplasm of rectum: Secondary | ICD-10-CM | POA: Diagnosis not present

## 2023-12-13 DIAGNOSIS — N1831 Chronic kidney disease, stage 3a: Secondary | ICD-10-CM | POA: Diagnosis not present

## 2023-12-13 DIAGNOSIS — K56600 Partial intestinal obstruction, unspecified as to cause: Secondary | ICD-10-CM | POA: Diagnosis not present

## 2023-12-13 DIAGNOSIS — R1084 Generalized abdominal pain: Secondary | ICD-10-CM | POA: Diagnosis not present

## 2023-12-13 DIAGNOSIS — I10 Essential (primary) hypertension: Secondary | ICD-10-CM | POA: Diagnosis not present

## 2023-12-14 DIAGNOSIS — N1831 Chronic kidney disease, stage 3a: Secondary | ICD-10-CM | POA: Diagnosis not present

## 2023-12-14 DIAGNOSIS — C20 Malignant neoplasm of rectum: Secondary | ICD-10-CM | POA: Diagnosis not present

## 2023-12-14 DIAGNOSIS — R1084 Generalized abdominal pain: Secondary | ICD-10-CM | POA: Diagnosis not present

## 2023-12-14 DIAGNOSIS — C786 Secondary malignant neoplasm of retroperitoneum and peritoneum: Secondary | ICD-10-CM | POA: Diagnosis not present

## 2023-12-14 DIAGNOSIS — K56600 Partial intestinal obstruction, unspecified as to cause: Secondary | ICD-10-CM | POA: Diagnosis not present

## 2023-12-14 DIAGNOSIS — I10 Essential (primary) hypertension: Secondary | ICD-10-CM | POA: Diagnosis not present

## 2023-12-15 DIAGNOSIS — C20 Malignant neoplasm of rectum: Secondary | ICD-10-CM | POA: Diagnosis not present

## 2023-12-15 DIAGNOSIS — R1084 Generalized abdominal pain: Secondary | ICD-10-CM | POA: Diagnosis not present

## 2023-12-15 DIAGNOSIS — N1831 Chronic kidney disease, stage 3a: Secondary | ICD-10-CM | POA: Diagnosis not present

## 2023-12-15 DIAGNOSIS — K56600 Partial intestinal obstruction, unspecified as to cause: Secondary | ICD-10-CM | POA: Diagnosis not present

## 2023-12-15 DIAGNOSIS — C786 Secondary malignant neoplasm of retroperitoneum and peritoneum: Secondary | ICD-10-CM | POA: Diagnosis not present

## 2023-12-15 DIAGNOSIS — I10 Essential (primary) hypertension: Secondary | ICD-10-CM | POA: Diagnosis not present

## 2023-12-16 DIAGNOSIS — C20 Malignant neoplasm of rectum: Secondary | ICD-10-CM | POA: Diagnosis not present

## 2023-12-16 DIAGNOSIS — K56600 Partial intestinal obstruction, unspecified as to cause: Secondary | ICD-10-CM | POA: Diagnosis not present

## 2023-12-16 DIAGNOSIS — R1084 Generalized abdominal pain: Secondary | ICD-10-CM | POA: Diagnosis not present

## 2023-12-16 DIAGNOSIS — I10 Essential (primary) hypertension: Secondary | ICD-10-CM | POA: Diagnosis not present

## 2023-12-16 DIAGNOSIS — C786 Secondary malignant neoplasm of retroperitoneum and peritoneum: Secondary | ICD-10-CM | POA: Diagnosis not present

## 2023-12-16 DIAGNOSIS — N1831 Chronic kidney disease, stage 3a: Secondary | ICD-10-CM | POA: Diagnosis not present

## 2023-12-17 DIAGNOSIS — C20 Malignant neoplasm of rectum: Secondary | ICD-10-CM | POA: Diagnosis not present

## 2023-12-17 DIAGNOSIS — C786 Secondary malignant neoplasm of retroperitoneum and peritoneum: Secondary | ICD-10-CM | POA: Diagnosis not present

## 2023-12-17 DIAGNOSIS — R1084 Generalized abdominal pain: Secondary | ICD-10-CM | POA: Diagnosis not present

## 2023-12-17 DIAGNOSIS — K56600 Partial intestinal obstruction, unspecified as to cause: Secondary | ICD-10-CM | POA: Diagnosis not present

## 2023-12-17 DIAGNOSIS — I10 Essential (primary) hypertension: Secondary | ICD-10-CM | POA: Diagnosis not present

## 2023-12-17 DIAGNOSIS — N1831 Chronic kidney disease, stage 3a: Secondary | ICD-10-CM | POA: Diagnosis not present

## 2023-12-18 DIAGNOSIS — K56600 Partial intestinal obstruction, unspecified as to cause: Secondary | ICD-10-CM | POA: Diagnosis not present

## 2023-12-18 DIAGNOSIS — I10 Essential (primary) hypertension: Secondary | ICD-10-CM | POA: Diagnosis not present

## 2023-12-18 DIAGNOSIS — N1831 Chronic kidney disease, stage 3a: Secondary | ICD-10-CM | POA: Diagnosis not present

## 2023-12-18 DIAGNOSIS — C20 Malignant neoplasm of rectum: Secondary | ICD-10-CM | POA: Diagnosis not present

## 2023-12-18 DIAGNOSIS — C786 Secondary malignant neoplasm of retroperitoneum and peritoneum: Secondary | ICD-10-CM | POA: Diagnosis not present

## 2023-12-18 DIAGNOSIS — R1084 Generalized abdominal pain: Secondary | ICD-10-CM | POA: Diagnosis not present

## 2023-12-19 DIAGNOSIS — C20 Malignant neoplasm of rectum: Secondary | ICD-10-CM | POA: Diagnosis not present

## 2023-12-19 DIAGNOSIS — R1084 Generalized abdominal pain: Secondary | ICD-10-CM | POA: Diagnosis not present

## 2023-12-19 DIAGNOSIS — C786 Secondary malignant neoplasm of retroperitoneum and peritoneum: Secondary | ICD-10-CM | POA: Diagnosis not present

## 2023-12-19 DIAGNOSIS — I10 Essential (primary) hypertension: Secondary | ICD-10-CM | POA: Diagnosis not present

## 2023-12-19 DIAGNOSIS — N1831 Chronic kidney disease, stage 3a: Secondary | ICD-10-CM | POA: Diagnosis not present

## 2023-12-19 DIAGNOSIS — K56600 Partial intestinal obstruction, unspecified as to cause: Secondary | ICD-10-CM | POA: Diagnosis not present

## 2023-12-20 DIAGNOSIS — K56600 Partial intestinal obstruction, unspecified as to cause: Secondary | ICD-10-CM | POA: Diagnosis not present

## 2023-12-20 DIAGNOSIS — R1084 Generalized abdominal pain: Secondary | ICD-10-CM | POA: Diagnosis not present

## 2023-12-20 DIAGNOSIS — I10 Essential (primary) hypertension: Secondary | ICD-10-CM | POA: Diagnosis not present

## 2023-12-20 DIAGNOSIS — C20 Malignant neoplasm of rectum: Secondary | ICD-10-CM | POA: Diagnosis not present

## 2023-12-20 DIAGNOSIS — N1831 Chronic kidney disease, stage 3a: Secondary | ICD-10-CM | POA: Diagnosis not present

## 2023-12-20 DIAGNOSIS — C786 Secondary malignant neoplasm of retroperitoneum and peritoneum: Secondary | ICD-10-CM | POA: Diagnosis not present

## 2023-12-21 DIAGNOSIS — I10 Essential (primary) hypertension: Secondary | ICD-10-CM | POA: Diagnosis not present

## 2023-12-21 DIAGNOSIS — N1831 Chronic kidney disease, stage 3a: Secondary | ICD-10-CM | POA: Diagnosis not present

## 2023-12-21 DIAGNOSIS — C20 Malignant neoplasm of rectum: Secondary | ICD-10-CM | POA: Diagnosis not present

## 2023-12-21 DIAGNOSIS — R1084 Generalized abdominal pain: Secondary | ICD-10-CM | POA: Diagnosis not present

## 2023-12-21 DIAGNOSIS — C786 Secondary malignant neoplasm of retroperitoneum and peritoneum: Secondary | ICD-10-CM | POA: Diagnosis not present

## 2023-12-21 DIAGNOSIS — K56600 Partial intestinal obstruction, unspecified as to cause: Secondary | ICD-10-CM | POA: Diagnosis not present

## 2023-12-22 DIAGNOSIS — K56600 Partial intestinal obstruction, unspecified as to cause: Secondary | ICD-10-CM | POA: Diagnosis not present

## 2023-12-22 DIAGNOSIS — I10 Essential (primary) hypertension: Secondary | ICD-10-CM | POA: Diagnosis not present

## 2023-12-22 DIAGNOSIS — C20 Malignant neoplasm of rectum: Secondary | ICD-10-CM | POA: Diagnosis not present

## 2023-12-22 DIAGNOSIS — C786 Secondary malignant neoplasm of retroperitoneum and peritoneum: Secondary | ICD-10-CM | POA: Diagnosis not present

## 2023-12-22 DIAGNOSIS — N1831 Chronic kidney disease, stage 3a: Secondary | ICD-10-CM | POA: Diagnosis not present

## 2023-12-22 DIAGNOSIS — R1084 Generalized abdominal pain: Secondary | ICD-10-CM | POA: Diagnosis not present

## 2023-12-23 DIAGNOSIS — K56600 Partial intestinal obstruction, unspecified as to cause: Secondary | ICD-10-CM | POA: Diagnosis not present

## 2023-12-23 DIAGNOSIS — C786 Secondary malignant neoplasm of retroperitoneum and peritoneum: Secondary | ICD-10-CM | POA: Diagnosis not present

## 2023-12-23 DIAGNOSIS — N1831 Chronic kidney disease, stage 3a: Secondary | ICD-10-CM | POA: Diagnosis not present

## 2023-12-23 DIAGNOSIS — I10 Essential (primary) hypertension: Secondary | ICD-10-CM | POA: Diagnosis not present

## 2023-12-23 DIAGNOSIS — C20 Malignant neoplasm of rectum: Secondary | ICD-10-CM | POA: Diagnosis not present

## 2023-12-23 DIAGNOSIS — R1084 Generalized abdominal pain: Secondary | ICD-10-CM | POA: Diagnosis not present

## 2023-12-24 DIAGNOSIS — R1084 Generalized abdominal pain: Secondary | ICD-10-CM | POA: Diagnosis not present

## 2023-12-24 DIAGNOSIS — C20 Malignant neoplasm of rectum: Secondary | ICD-10-CM | POA: Diagnosis not present

## 2023-12-24 DIAGNOSIS — N1831 Chronic kidney disease, stage 3a: Secondary | ICD-10-CM | POA: Diagnosis not present

## 2023-12-24 DIAGNOSIS — K56600 Partial intestinal obstruction, unspecified as to cause: Secondary | ICD-10-CM | POA: Diagnosis not present

## 2023-12-24 DIAGNOSIS — I10 Essential (primary) hypertension: Secondary | ICD-10-CM | POA: Diagnosis not present

## 2023-12-24 DIAGNOSIS — C786 Secondary malignant neoplasm of retroperitoneum and peritoneum: Secondary | ICD-10-CM | POA: Diagnosis not present

## 2023-12-25 ENCOUNTER — Other Ambulatory Visit: Payer: Self-pay | Admitting: Nurse Practitioner

## 2023-12-25 ENCOUNTER — Telehealth: Payer: Self-pay

## 2023-12-25 DIAGNOSIS — K56600 Partial intestinal obstruction, unspecified as to cause: Secondary | ICD-10-CM | POA: Diagnosis not present

## 2023-12-25 DIAGNOSIS — R1084 Generalized abdominal pain: Secondary | ICD-10-CM | POA: Diagnosis not present

## 2023-12-25 DIAGNOSIS — C20 Malignant neoplasm of rectum: Secondary | ICD-10-CM | POA: Diagnosis not present

## 2023-12-25 DIAGNOSIS — I10 Essential (primary) hypertension: Secondary | ICD-10-CM | POA: Diagnosis not present

## 2023-12-25 DIAGNOSIS — N1831 Chronic kidney disease, stage 3a: Secondary | ICD-10-CM | POA: Diagnosis not present

## 2023-12-25 DIAGNOSIS — C786 Secondary malignant neoplasm of retroperitoneum and peritoneum: Secondary | ICD-10-CM | POA: Diagnosis not present

## 2023-12-25 NOTE — Telephone Encounter (Addendum)
 Nancy from [facility], contacted us  to request a refill for the patient's Tramadol  and Ativan  prescription.  I contacted [facility] and left a message indicating that, per Lisa, [facility] providers can refill the medication, as they have done so for the past four times.

## 2023-12-26 DIAGNOSIS — C786 Secondary malignant neoplasm of retroperitoneum and peritoneum: Secondary | ICD-10-CM | POA: Diagnosis not present

## 2023-12-26 DIAGNOSIS — N1831 Chronic kidney disease, stage 3a: Secondary | ICD-10-CM | POA: Diagnosis not present

## 2023-12-26 DIAGNOSIS — I10 Essential (primary) hypertension: Secondary | ICD-10-CM | POA: Diagnosis not present

## 2023-12-26 DIAGNOSIS — C20 Malignant neoplasm of rectum: Secondary | ICD-10-CM | POA: Diagnosis not present

## 2023-12-26 DIAGNOSIS — K56600 Partial intestinal obstruction, unspecified as to cause: Secondary | ICD-10-CM | POA: Diagnosis not present

## 2023-12-26 DIAGNOSIS — R1084 Generalized abdominal pain: Secondary | ICD-10-CM | POA: Diagnosis not present

## 2023-12-27 DIAGNOSIS — C786 Secondary malignant neoplasm of retroperitoneum and peritoneum: Secondary | ICD-10-CM | POA: Diagnosis not present

## 2023-12-27 DIAGNOSIS — N1831 Chronic kidney disease, stage 3a: Secondary | ICD-10-CM | POA: Diagnosis not present

## 2023-12-27 DIAGNOSIS — K56600 Partial intestinal obstruction, unspecified as to cause: Secondary | ICD-10-CM | POA: Diagnosis not present

## 2023-12-27 DIAGNOSIS — R1084 Generalized abdominal pain: Secondary | ICD-10-CM | POA: Diagnosis not present

## 2023-12-27 DIAGNOSIS — I10 Essential (primary) hypertension: Secondary | ICD-10-CM | POA: Diagnosis not present

## 2023-12-27 DIAGNOSIS — C20 Malignant neoplasm of rectum: Secondary | ICD-10-CM | POA: Diagnosis not present

## 2023-12-28 DIAGNOSIS — K56600 Partial intestinal obstruction, unspecified as to cause: Secondary | ICD-10-CM | POA: Diagnosis not present

## 2023-12-28 DIAGNOSIS — I10 Essential (primary) hypertension: Secondary | ICD-10-CM | POA: Diagnosis not present

## 2023-12-28 DIAGNOSIS — N1831 Chronic kidney disease, stage 3a: Secondary | ICD-10-CM | POA: Diagnosis not present

## 2023-12-28 DIAGNOSIS — R1084 Generalized abdominal pain: Secondary | ICD-10-CM | POA: Diagnosis not present

## 2023-12-28 DIAGNOSIS — C20 Malignant neoplasm of rectum: Secondary | ICD-10-CM | POA: Diagnosis not present

## 2023-12-28 DIAGNOSIS — C786 Secondary malignant neoplasm of retroperitoneum and peritoneum: Secondary | ICD-10-CM | POA: Diagnosis not present

## 2023-12-29 DIAGNOSIS — R1084 Generalized abdominal pain: Secondary | ICD-10-CM | POA: Diagnosis not present

## 2023-12-29 DIAGNOSIS — C20 Malignant neoplasm of rectum: Secondary | ICD-10-CM | POA: Diagnosis not present

## 2023-12-29 DIAGNOSIS — K56600 Partial intestinal obstruction, unspecified as to cause: Secondary | ICD-10-CM | POA: Diagnosis not present

## 2023-12-29 DIAGNOSIS — I10 Essential (primary) hypertension: Secondary | ICD-10-CM | POA: Diagnosis not present

## 2023-12-29 DIAGNOSIS — N1831 Chronic kidney disease, stage 3a: Secondary | ICD-10-CM | POA: Diagnosis not present

## 2023-12-29 DIAGNOSIS — C786 Secondary malignant neoplasm of retroperitoneum and peritoneum: Secondary | ICD-10-CM | POA: Diagnosis not present

## 2023-12-30 ENCOUNTER — Encounter: Payer: Self-pay | Admitting: Oncology

## 2023-12-30 DIAGNOSIS — I10 Essential (primary) hypertension: Secondary | ICD-10-CM | POA: Diagnosis not present

## 2023-12-30 DIAGNOSIS — K56600 Partial intestinal obstruction, unspecified as to cause: Secondary | ICD-10-CM | POA: Diagnosis not present

## 2023-12-30 DIAGNOSIS — C786 Secondary malignant neoplasm of retroperitoneum and peritoneum: Secondary | ICD-10-CM | POA: Diagnosis not present

## 2023-12-30 DIAGNOSIS — N1831 Chronic kidney disease, stage 3a: Secondary | ICD-10-CM | POA: Diagnosis not present

## 2023-12-30 DIAGNOSIS — R1084 Generalized abdominal pain: Secondary | ICD-10-CM | POA: Diagnosis not present

## 2023-12-30 DIAGNOSIS — C20 Malignant neoplasm of rectum: Secondary | ICD-10-CM | POA: Diagnosis not present

## 2023-12-30 NOTE — Progress Notes (Signed)
err

## 2023-12-31 DIAGNOSIS — C20 Malignant neoplasm of rectum: Secondary | ICD-10-CM | POA: Diagnosis not present

## 2023-12-31 DIAGNOSIS — N1831 Chronic kidney disease, stage 3a: Secondary | ICD-10-CM | POA: Diagnosis not present

## 2023-12-31 DIAGNOSIS — C786 Secondary malignant neoplasm of retroperitoneum and peritoneum: Secondary | ICD-10-CM | POA: Diagnosis not present

## 2023-12-31 DIAGNOSIS — I10 Essential (primary) hypertension: Secondary | ICD-10-CM | POA: Diagnosis not present

## 2023-12-31 DIAGNOSIS — K56600 Partial intestinal obstruction, unspecified as to cause: Secondary | ICD-10-CM | POA: Diagnosis not present

## 2023-12-31 DIAGNOSIS — R1084 Generalized abdominal pain: Secondary | ICD-10-CM | POA: Diagnosis not present

## 2024-01-01 DIAGNOSIS — I10 Essential (primary) hypertension: Secondary | ICD-10-CM | POA: Diagnosis not present

## 2024-01-01 DIAGNOSIS — N1831 Chronic kidney disease, stage 3a: Secondary | ICD-10-CM | POA: Diagnosis not present

## 2024-01-01 DIAGNOSIS — R1084 Generalized abdominal pain: Secondary | ICD-10-CM | POA: Diagnosis not present

## 2024-01-01 DIAGNOSIS — K56600 Partial intestinal obstruction, unspecified as to cause: Secondary | ICD-10-CM | POA: Diagnosis not present

## 2024-01-01 DIAGNOSIS — C20 Malignant neoplasm of rectum: Secondary | ICD-10-CM | POA: Diagnosis not present

## 2024-01-01 DIAGNOSIS — C786 Secondary malignant neoplasm of retroperitoneum and peritoneum: Secondary | ICD-10-CM | POA: Diagnosis not present

## 2024-01-02 DIAGNOSIS — K56600 Partial intestinal obstruction, unspecified as to cause: Secondary | ICD-10-CM | POA: Diagnosis not present

## 2024-01-02 DIAGNOSIS — C786 Secondary malignant neoplasm of retroperitoneum and peritoneum: Secondary | ICD-10-CM | POA: Diagnosis not present

## 2024-01-02 DIAGNOSIS — N1831 Chronic kidney disease, stage 3a: Secondary | ICD-10-CM | POA: Diagnosis not present

## 2024-01-02 DIAGNOSIS — I10 Essential (primary) hypertension: Secondary | ICD-10-CM | POA: Diagnosis not present

## 2024-01-02 DIAGNOSIS — C20 Malignant neoplasm of rectum: Secondary | ICD-10-CM | POA: Diagnosis not present

## 2024-01-02 DIAGNOSIS — R1084 Generalized abdominal pain: Secondary | ICD-10-CM | POA: Diagnosis not present

## 2024-01-03 DIAGNOSIS — C786 Secondary malignant neoplasm of retroperitoneum and peritoneum: Secondary | ICD-10-CM | POA: Diagnosis not present

## 2024-01-03 DIAGNOSIS — K56600 Partial intestinal obstruction, unspecified as to cause: Secondary | ICD-10-CM | POA: Diagnosis not present

## 2024-01-03 DIAGNOSIS — C20 Malignant neoplasm of rectum: Secondary | ICD-10-CM | POA: Diagnosis not present

## 2024-01-03 DIAGNOSIS — R1084 Generalized abdominal pain: Secondary | ICD-10-CM | POA: Diagnosis not present

## 2024-01-03 DIAGNOSIS — N1831 Chronic kidney disease, stage 3a: Secondary | ICD-10-CM | POA: Diagnosis not present

## 2024-01-03 DIAGNOSIS — I10 Essential (primary) hypertension: Secondary | ICD-10-CM | POA: Diagnosis not present

## 2024-01-04 ENCOUNTER — Telehealth: Payer: Self-pay

## 2024-01-04 DIAGNOSIS — C20 Malignant neoplasm of rectum: Secondary | ICD-10-CM | POA: Diagnosis not present

## 2024-01-04 DIAGNOSIS — K56600 Partial intestinal obstruction, unspecified as to cause: Secondary | ICD-10-CM | POA: Diagnosis not present

## 2024-01-04 DIAGNOSIS — C786 Secondary malignant neoplasm of retroperitoneum and peritoneum: Secondary | ICD-10-CM | POA: Diagnosis not present

## 2024-01-04 DIAGNOSIS — N1831 Chronic kidney disease, stage 3a: Secondary | ICD-10-CM | POA: Diagnosis not present

## 2024-01-04 DIAGNOSIS — R1084 Generalized abdominal pain: Secondary | ICD-10-CM | POA: Diagnosis not present

## 2024-01-04 DIAGNOSIS — I10 Essential (primary) hypertension: Secondary | ICD-10-CM | POA: Diagnosis not present

## 2024-01-04 NOTE — Telephone Encounter (Signed)
 Harriett Sine, RN from [facility], called to request a verbal order to replace the patient's NG tube. The order was given to proceed with the replacement of the NG tube.

## 2024-01-05 DIAGNOSIS — R1084 Generalized abdominal pain: Secondary | ICD-10-CM | POA: Diagnosis not present

## 2024-01-05 DIAGNOSIS — N1831 Chronic kidney disease, stage 3a: Secondary | ICD-10-CM | POA: Diagnosis not present

## 2024-01-05 DIAGNOSIS — C786 Secondary malignant neoplasm of retroperitoneum and peritoneum: Secondary | ICD-10-CM | POA: Diagnosis not present

## 2024-01-05 DIAGNOSIS — K56600 Partial intestinal obstruction, unspecified as to cause: Secondary | ICD-10-CM | POA: Diagnosis not present

## 2024-01-05 DIAGNOSIS — C20 Malignant neoplasm of rectum: Secondary | ICD-10-CM | POA: Diagnosis not present

## 2024-01-05 DIAGNOSIS — I10 Essential (primary) hypertension: Secondary | ICD-10-CM | POA: Diagnosis not present

## 2024-01-06 DIAGNOSIS — R1084 Generalized abdominal pain: Secondary | ICD-10-CM | POA: Diagnosis not present

## 2024-01-06 DIAGNOSIS — C20 Malignant neoplasm of rectum: Secondary | ICD-10-CM | POA: Diagnosis not present

## 2024-01-06 DIAGNOSIS — C786 Secondary malignant neoplasm of retroperitoneum and peritoneum: Secondary | ICD-10-CM | POA: Diagnosis not present

## 2024-01-06 DIAGNOSIS — K56600 Partial intestinal obstruction, unspecified as to cause: Secondary | ICD-10-CM | POA: Diagnosis not present

## 2024-01-06 DIAGNOSIS — N1831 Chronic kidney disease, stage 3a: Secondary | ICD-10-CM | POA: Diagnosis not present

## 2024-01-06 DIAGNOSIS — I10 Essential (primary) hypertension: Secondary | ICD-10-CM | POA: Diagnosis not present

## 2024-01-07 DIAGNOSIS — C20 Malignant neoplasm of rectum: Secondary | ICD-10-CM | POA: Diagnosis not present

## 2024-01-07 DIAGNOSIS — K56600 Partial intestinal obstruction, unspecified as to cause: Secondary | ICD-10-CM | POA: Diagnosis not present

## 2024-01-07 DIAGNOSIS — R1084 Generalized abdominal pain: Secondary | ICD-10-CM | POA: Diagnosis not present

## 2024-01-07 DIAGNOSIS — N1831 Chronic kidney disease, stage 3a: Secondary | ICD-10-CM | POA: Diagnosis not present

## 2024-01-07 DIAGNOSIS — C786 Secondary malignant neoplasm of retroperitoneum and peritoneum: Secondary | ICD-10-CM | POA: Diagnosis not present

## 2024-01-07 DIAGNOSIS — I10 Essential (primary) hypertension: Secondary | ICD-10-CM | POA: Diagnosis not present

## 2024-01-08 DIAGNOSIS — N1831 Chronic kidney disease, stage 3a: Secondary | ICD-10-CM | POA: Diagnosis not present

## 2024-01-08 DIAGNOSIS — I10 Essential (primary) hypertension: Secondary | ICD-10-CM | POA: Diagnosis not present

## 2024-01-08 DIAGNOSIS — R1084 Generalized abdominal pain: Secondary | ICD-10-CM | POA: Diagnosis not present

## 2024-01-08 DIAGNOSIS — C20 Malignant neoplasm of rectum: Secondary | ICD-10-CM | POA: Diagnosis not present

## 2024-01-08 DIAGNOSIS — C786 Secondary malignant neoplasm of retroperitoneum and peritoneum: Secondary | ICD-10-CM | POA: Diagnosis not present

## 2024-01-08 DIAGNOSIS — K56600 Partial intestinal obstruction, unspecified as to cause: Secondary | ICD-10-CM | POA: Diagnosis not present

## 2024-01-09 DIAGNOSIS — R1084 Generalized abdominal pain: Secondary | ICD-10-CM | POA: Diagnosis not present

## 2024-01-09 DIAGNOSIS — I10 Essential (primary) hypertension: Secondary | ICD-10-CM | POA: Diagnosis not present

## 2024-01-09 DIAGNOSIS — N1831 Chronic kidney disease, stage 3a: Secondary | ICD-10-CM | POA: Diagnosis not present

## 2024-01-09 DIAGNOSIS — C786 Secondary malignant neoplasm of retroperitoneum and peritoneum: Secondary | ICD-10-CM | POA: Diagnosis not present

## 2024-01-09 DIAGNOSIS — C20 Malignant neoplasm of rectum: Secondary | ICD-10-CM | POA: Diagnosis not present

## 2024-01-09 DIAGNOSIS — K56600 Partial intestinal obstruction, unspecified as to cause: Secondary | ICD-10-CM | POA: Diagnosis not present

## 2024-01-10 DIAGNOSIS — N1831 Chronic kidney disease, stage 3a: Secondary | ICD-10-CM | POA: Diagnosis not present

## 2024-01-10 DIAGNOSIS — K56600 Partial intestinal obstruction, unspecified as to cause: Secondary | ICD-10-CM | POA: Diagnosis not present

## 2024-01-10 DIAGNOSIS — C20 Malignant neoplasm of rectum: Secondary | ICD-10-CM | POA: Diagnosis not present

## 2024-01-10 DIAGNOSIS — I10 Essential (primary) hypertension: Secondary | ICD-10-CM | POA: Diagnosis not present

## 2024-01-10 DIAGNOSIS — C786 Secondary malignant neoplasm of retroperitoneum and peritoneum: Secondary | ICD-10-CM | POA: Diagnosis not present

## 2024-01-10 DIAGNOSIS — R1084 Generalized abdominal pain: Secondary | ICD-10-CM | POA: Diagnosis not present

## 2024-01-11 DIAGNOSIS — K56600 Partial intestinal obstruction, unspecified as to cause: Secondary | ICD-10-CM | POA: Diagnosis not present

## 2024-01-11 DIAGNOSIS — C786 Secondary malignant neoplasm of retroperitoneum and peritoneum: Secondary | ICD-10-CM | POA: Diagnosis not present

## 2024-01-11 DIAGNOSIS — C20 Malignant neoplasm of rectum: Secondary | ICD-10-CM | POA: Diagnosis not present

## 2024-01-11 DIAGNOSIS — I10 Essential (primary) hypertension: Secondary | ICD-10-CM | POA: Diagnosis not present

## 2024-01-11 DIAGNOSIS — N1831 Chronic kidney disease, stage 3a: Secondary | ICD-10-CM | POA: Diagnosis not present

## 2024-01-11 DIAGNOSIS — R1084 Generalized abdominal pain: Secondary | ICD-10-CM | POA: Diagnosis not present

## 2024-01-12 DIAGNOSIS — K56600 Partial intestinal obstruction, unspecified as to cause: Secondary | ICD-10-CM | POA: Diagnosis not present

## 2024-01-12 DIAGNOSIS — I10 Essential (primary) hypertension: Secondary | ICD-10-CM | POA: Diagnosis not present

## 2024-01-12 DIAGNOSIS — C20 Malignant neoplasm of rectum: Secondary | ICD-10-CM | POA: Diagnosis not present

## 2024-01-12 DIAGNOSIS — C786 Secondary malignant neoplasm of retroperitoneum and peritoneum: Secondary | ICD-10-CM | POA: Diagnosis not present

## 2024-01-12 DIAGNOSIS — N1831 Chronic kidney disease, stage 3a: Secondary | ICD-10-CM | POA: Diagnosis not present

## 2024-01-12 DIAGNOSIS — R1084 Generalized abdominal pain: Secondary | ICD-10-CM | POA: Diagnosis not present

## 2024-01-13 DIAGNOSIS — I10 Essential (primary) hypertension: Secondary | ICD-10-CM | POA: Diagnosis not present

## 2024-01-13 DIAGNOSIS — K56600 Partial intestinal obstruction, unspecified as to cause: Secondary | ICD-10-CM | POA: Diagnosis not present

## 2024-01-13 DIAGNOSIS — R1084 Generalized abdominal pain: Secondary | ICD-10-CM | POA: Diagnosis not present

## 2024-01-13 DIAGNOSIS — N1831 Chronic kidney disease, stage 3a: Secondary | ICD-10-CM | POA: Diagnosis not present

## 2024-01-13 DIAGNOSIS — C20 Malignant neoplasm of rectum: Secondary | ICD-10-CM | POA: Diagnosis not present

## 2024-01-13 DIAGNOSIS — C786 Secondary malignant neoplasm of retroperitoneum and peritoneum: Secondary | ICD-10-CM | POA: Diagnosis not present

## 2024-01-14 ENCOUNTER — Telehealth: Payer: Self-pay | Admitting: *Deleted

## 2024-01-14 DIAGNOSIS — C20 Malignant neoplasm of rectum: Secondary | ICD-10-CM | POA: Diagnosis not present

## 2024-01-14 DIAGNOSIS — I10 Essential (primary) hypertension: Secondary | ICD-10-CM | POA: Diagnosis not present

## 2024-01-14 DIAGNOSIS — N1831 Chronic kidney disease, stage 3a: Secondary | ICD-10-CM | POA: Diagnosis not present

## 2024-01-14 DIAGNOSIS — R1084 Generalized abdominal pain: Secondary | ICD-10-CM | POA: Diagnosis not present

## 2024-01-14 DIAGNOSIS — C786 Secondary malignant neoplasm of retroperitoneum and peritoneum: Secondary | ICD-10-CM | POA: Diagnosis not present

## 2024-01-14 DIAGNOSIS — K56600 Partial intestinal obstruction, unspecified as to cause: Secondary | ICD-10-CM | POA: Diagnosis not present

## 2024-01-14 NOTE — Telephone Encounter (Signed)
 Having sharp decline with pain, restlessness and agitation. Asking if Dr. [name] wants Hospice MD to manage his end of life issues/care. OK per Dr. [name] for Hospice MD to manage.

## 2024-01-15 DIAGNOSIS — N1831 Chronic kidney disease, stage 3a: Secondary | ICD-10-CM | POA: Diagnosis not present

## 2024-01-15 DIAGNOSIS — R1084 Generalized abdominal pain: Secondary | ICD-10-CM | POA: Diagnosis not present

## 2024-01-15 DIAGNOSIS — K56600 Partial intestinal obstruction, unspecified as to cause: Secondary | ICD-10-CM | POA: Diagnosis not present

## 2024-01-15 DIAGNOSIS — I10 Essential (primary) hypertension: Secondary | ICD-10-CM | POA: Diagnosis not present

## 2024-01-15 DIAGNOSIS — C786 Secondary malignant neoplasm of retroperitoneum and peritoneum: Secondary | ICD-10-CM | POA: Diagnosis not present

## 2024-01-15 DIAGNOSIS — C20 Malignant neoplasm of rectum: Secondary | ICD-10-CM | POA: Diagnosis not present

## 2024-02-16 DEATH — deceased
